# Patient Record
Sex: Female | Born: 1960 | Race: White | Hispanic: No | Smoking: Current every day smoker
Health system: Southern US, Community
[De-identification: ages and names within clinical notes are randomized; demographics above are authoritative.]

## PROBLEM LIST (undated history)

## (undated) DIAGNOSIS — E559 Vitamin D deficiency, unspecified: Secondary | ICD-10-CM

## (undated) DIAGNOSIS — I700 Atherosclerosis of aorta: Secondary | ICD-10-CM

## (undated) DIAGNOSIS — M810 Age-related osteoporosis without current pathological fracture: Secondary | ICD-10-CM

## (undated) DIAGNOSIS — I708 Atherosclerosis of other arteries: Secondary | ICD-10-CM

## (undated) DIAGNOSIS — J449 Chronic obstructive pulmonary disease, unspecified: Secondary | ICD-10-CM

## (undated) DIAGNOSIS — Z720 Tobacco use: Secondary | ICD-10-CM

## (undated) DIAGNOSIS — I10 Essential (primary) hypertension: Secondary | ICD-10-CM

## (undated) HISTORY — DX: Age-related osteoporosis without current pathological fracture: M81.0

## (undated) HISTORY — DX: Atherosclerosis of aorta: I70.0

## (undated) HISTORY — DX: Atherosclerosis of other arteries: I70.8

## (undated) HISTORY — PX: TUBAL LIGATION: SHX77

## (undated) HISTORY — PX: OTHER SURGICAL HISTORY: SHX169

## (undated) HISTORY — DX: Vitamin D deficiency, unspecified: E55.9

## (undated) HISTORY — DX: Tobacco use: Z72.0

## (undated) HISTORY — DX: Chronic obstructive pulmonary disease, unspecified: J44.9

---

## 2009-11-07 ENCOUNTER — Inpatient Hospital Stay: Payer: Self-pay | Admitting: Specialist

## 2009-11-07 HISTORY — PX: ANKLE FRACTURE SURGERY: SHX122

## 2010-05-08 ENCOUNTER — Emergency Department (HOSPITAL_COMMUNITY): Admission: EM | Admit: 2010-05-08 | Discharge: 2010-05-08 | Payer: Self-pay | Admitting: Emergency Medicine

## 2010-06-01 ENCOUNTER — Emergency Department: Payer: Self-pay | Admitting: Emergency Medicine

## 2010-10-22 LAB — URINALYSIS, ROUTINE W REFLEX MICROSCOPIC
Bilirubin Urine: NEGATIVE
Ketones, ur: NEGATIVE mg/dL
Nitrite: NEGATIVE
Protein, ur: NEGATIVE mg/dL
Specific Gravity, Urine: 1.02 (ref 1.005–1.030)
Urobilinogen, UA: 0.2 mg/dL (ref 0.0–1.0)

## 2010-10-22 LAB — PROTIME-INR
INR: 0.87 (ref 0.00–1.49)
Prothrombin Time: 12 seconds (ref 11.6–15.2)

## 2010-10-22 LAB — COMPREHENSIVE METABOLIC PANEL
Albumin: 4 g/dL (ref 3.5–5.2)
BUN: 6 mg/dL (ref 6–23)
Chloride: 110 mEq/L (ref 96–112)
Creatinine, Ser: 0.75 mg/dL (ref 0.4–1.2)
Total Bilirubin: 0.3 mg/dL (ref 0.3–1.2)

## 2010-10-22 LAB — CBC
MCH: 30.4 pg (ref 26.0–34.0)
MCV: 91.2 fL (ref 78.0–100.0)
Platelets: 345 10*3/uL (ref 150–400)
RDW: 13.1 % (ref 11.5–15.5)

## 2010-10-22 LAB — LACTIC ACID, PLASMA: Lactic Acid, Venous: 2.5 mmol/L — ABNORMAL HIGH (ref 0.5–2.2)

## 2010-10-22 LAB — URINE MICROSCOPIC-ADD ON

## 2011-08-14 ENCOUNTER — Emergency Department: Payer: Self-pay | Admitting: Emergency Medicine

## 2011-08-14 LAB — TROPONIN I: Troponin-I: <0.02 ng/mL

## 2011-08-14 LAB — COMPREHENSIVE METABOLIC PANEL
Alkaline Phosphatase: 78 U/L (ref 50–136)
BUN: 5 mg/dL — ABNORMAL LOW (ref 7–18)
Chloride: 109 mmol/L — ABNORMAL HIGH (ref 98–107)
Co2: 26 mmol/L (ref 21–32)
EGFR (African American): >60
EGFR (Non-African Amer.): >60
SGOT(AST): 21 U/L (ref 15–37)
SGPT (ALT): 24 U/L

## 2011-08-14 LAB — CBC
MCH: 31 pg (ref 26.0–34.0)
MCHC: 33.4 g/dL (ref 32.0–36.0)
MCV: 93 fL (ref 80–100)
Platelet: 310 10*3/uL (ref 150–440)
RDW: 13.1 % (ref 11.5–14.5)

## 2011-12-24 ENCOUNTER — Emergency Department (HOSPITAL_COMMUNITY): Payer: Self-pay

## 2011-12-24 ENCOUNTER — Encounter (HOSPITAL_COMMUNITY): Payer: Self-pay

## 2011-12-24 ENCOUNTER — Emergency Department (HOSPITAL_COMMUNITY)
Admission: EM | Admit: 2011-12-24 | Discharge: 2011-12-24 | Disposition: A | Discharge status: Home / Self Care | Payer: Self-pay | Attending: Emergency Medicine | Admitting: Emergency Medicine

## 2011-12-24 DIAGNOSIS — X500XXA Overexertion from strenuous movement or load, initial encounter: Secondary | ICD-10-CM | POA: Insufficient documentation

## 2011-12-24 DIAGNOSIS — M25473 Effusion, unspecified ankle: Secondary | ICD-10-CM | POA: Insufficient documentation

## 2011-12-24 DIAGNOSIS — L0390 Cellulitis, unspecified: Secondary | ICD-10-CM

## 2011-12-24 DIAGNOSIS — M25476 Effusion, unspecified foot: Secondary | ICD-10-CM | POA: Insufficient documentation

## 2011-12-24 DIAGNOSIS — S93409A Sprain of unspecified ligament of unspecified ankle, initial encounter: Secondary | ICD-10-CM | POA: Insufficient documentation

## 2011-12-24 DIAGNOSIS — M25579 Pain in unspecified ankle and joints of unspecified foot: Secondary | ICD-10-CM | POA: Insufficient documentation

## 2011-12-24 DIAGNOSIS — Y92009 Unspecified place in unspecified non-institutional (private) residence as the place of occurrence of the external cause: Secondary | ICD-10-CM | POA: Insufficient documentation

## 2011-12-24 DIAGNOSIS — L02419 Cutaneous abscess of limb, unspecified: Secondary | ICD-10-CM | POA: Insufficient documentation

## 2011-12-24 MED ORDER — CLINDAMYCIN HCL 150 MG PO CAPS: 300.0000 mg | ORAL_CAPSULE | Freq: Three times a day (TID) | ORAL | Status: DC

## 2011-12-24 MED ORDER — CLINDAMYCIN HCL 150 MG PO CAPS: 300.0000 mg | ORAL_CAPSULE | Freq: Three times a day (TID) | ORAL | Status: AC

## 2011-12-24 MED ORDER — KETOROLAC TROMETHAMINE 30 MG/ML IJ SOLN
30.0000 mg | Freq: Once | INTRAMUSCULAR | Status: AC
  Administered 2011-12-24: 30 mg via INTRAMUSCULAR
  Filled 2011-12-24: qty 1

## 2011-12-24 MED ORDER — OXYCODONE-ACETAMINOPHEN 5-325 MG PO TABS: 1.0000 | ORAL_TABLET | ORAL | Status: AC | PRN

## 2011-12-24 NOTE — ED Notes (Signed)
Pt c/o redness, swelling, and pain to (L) leg x4 days. Pt reports she broke her (L) ankle April 2011, had surgery to ankle, reports episodes of swelling to extremity, has received IV ABX w/ no relief. Pt also c/o sob, chest pain under (L) breast, N/V,  and headache x2-3 days. Pt denies back/abd pain or diaphoresis.

## 2011-12-24 NOTE — ED Notes (Signed)
Swelling in left foot and redness, sts nauseated and not feeling well

## 2011-12-24 NOTE — ED Provider Notes (Signed)
History     CSN: [id number]  Arrival date & time 12/24/11  1423   First MD Initiated Contact with Patient 12/24/11 1547      Chief Complaint  Patient presents with  . Foot Swelling    (Consider location/radiation/quality/duration/timing/severity/associated sxs/prior treatment) Patient is a 51 y.o. female presenting with ankle pain. The history is provided by the patient.  Ankle Pain  Incident onset: 4 days ago. The incident occurred at home. Injury mechanism: twisted left ankle. The pain is present in the left ankle. The quality of the pain is described as aching. The pain is mild. The pain has been constant since onset. Associated symptoms include inability to bear weight. She reports no foreign bodies present. The symptoms are aggravated by bearing weight and palpation. She has tried NSAIDs for the symptoms. The treatment provided mild relief.    Past Medical History  Diagnosis Date  . Asthma     History reviewed. No pertinent past surgical history.  History reviewed. No pertinent family history.  History  Substance Use Topics  . Smoking status: Not on file  . Smokeless tobacco: Not on file  . Alcohol Use:     OB History    Grav Para Term Preterm Abortions TAB SAB Ect Mult Living                  Review of Systems  Constitutional: Negative for fever and fatigue.  HENT: Negative for congestion, drooling and neck pain.   Eyes: Negative for pain.  Respiratory: Negative for cough and shortness of breath.   Cardiovascular: Negative for chest pain.  Gastrointestinal: Negative for nausea, vomiting, abdominal pain and diarrhea.  Genitourinary: Negative for dysuria and hematuria.  Musculoskeletal: Negative for back pain and gait problem.  Skin: Negative for color change.  Neurological: Negative for dizziness and headaches.  Hematological: Negative for adenopathy.  Psychiatric/Behavioral: Negative for behavioral problems.  All other systems reviewed and are  negative.    Allergies  Vicodin  Home Medications   Current Outpatient Rx  Name Route Sig Dispense Refill  . CLINDAMYCIN HCL 150 MG PO CAPS Oral Take 2 capsules (300 mg total) by mouth 3 (three) times daily. 30 capsule 0  . OXYCODONE-ACETAMINOPHEN 5-325 MG PO TABS Oral Take 1 tablet by mouth every 4 (four) hours as needed for pain. 20 tablet 0    BP 176/82  Pulse 84  Temp(Src) 97.8 F (36.6 C) (Oral)  Resp 18  Physical Exam  Constitutional: She is oriented to person, place, and time. She appears well-developed and well-nourished.  HENT:  Head: Normocephalic.  Mouth/Throat: No oropharyngeal exudate.  Eyes: Conjunctivae and EOM are normal. Pupils are equal, round, and reactive to light.  Neck: Normal range of motion. Neck supple.  Cardiovascular: Normal rate, regular rhythm, normal heart sounds and intact distal pulses.  Exam reveals no gallop and no friction rub.   No murmur heard. Pulmonary/Chest: Effort normal and breath sounds normal. No respiratory distress. She has no wheezes.  Abdominal: Soft. Bowel sounds are normal. There is no tenderness.  Musculoskeletal: Normal range of motion. She exhibits no edema.       Feet:       Normal rom of LLE.   Neurological: She is alert and oriented to person, place, and time.  Skin: Skin is warm and dry.  Psychiatric: She has a normal mood and affect. Her behavior is normal.    ED Course  Procedures (including critical care time)  Labs Reviewed - No  data to display Dg Ankle Complete Left  12/24/2011  *RADIOLOGY REPORT*  Clinical Data: Prior left ankle surgery.  Redness, swelling, lateral pain.  LEFT ANKLE COMPLETE - 3+ VIEW  Comparison: None.  Findings: Evidence of prior fusion with plate and screw fixation in the distal left fibula. No acute fracture, subluxation or dislocation.  Soft tissue swelling within the lateral lower calf. No hardware complicating feature.  IMPRESSION: Evidence of remote injury and internal fixation. No  acute bony abnormality.  Original Report Authenticated By: Cyndie Chime, M.D.     1. Cellulitis   2. Ankle sprain       MDM  5:13 PM 51 y.o. female w hx of left ankle surgery in 2011 after a fall pw redness and swelling of left ankle that occurred 4 days ago after twisting her ankle. Pt has had mild nausea, emesis x 1 today. Pt notes intermittent swelling/redness since her surgery, she states she was on po abx for 6 mos about a year ago. Pt AFVSS here, appears well on exam. Will get pain control and plain film.    5:13 PM: Pt continues to appear well, imaging non-contrib. Suspect mild cellulitis. Will give Rx for clinda and percocet. I have discussed the diagnosis/risks/treatment options with the patient and believe the pt to be eligible for discharge home to follow-up with surgeon, Dr. Hyacinth Meeker in 3 days. We also discussed returning to the ED immediately if new or worsening sx occur. We discussed the sx which are most concerning (e.g., spreading redness, worsening pain) that necessitate immediate return. Any new prescriptions provided to the patient are listed below. Will keep pt NWB to LLE.   New Prescriptions   CLINDAMYCIN (CLEOCIN) 150 MG CAPSULE    Take 2 capsules (300 mg total) by mouth 3 (three) times daily.   OXYCODONE-ACETAMINOPHEN (PERCOCET) 5-325 MG PER TABLET    Take 1 tablet by mouth every 4 (four) hours as needed for pain.    Clinical Impression 1. Cellulitis   2. Ankle sprain        Purvis Sheffield, MD 12/25/11 [phone]

## 2011-12-26 NOTE — ED Provider Notes (Signed)
I have personally seen and examined the patient.  I have discussed the plan of care with the resident.  I have reviewed the documentation on PMH/FH/Soc. History.  I have reviewed the documentation of the resident and agree.  I doubt septic joint at this time, pt reports symptoms started after twisting ankle Advised f/u with her orthopedist  Joya Gaskins, MD 12/26/11 0130

## 2011-12-27 ENCOUNTER — Emergency Department: Payer: Self-pay | Admitting: Emergency Medicine

## 2011-12-27 LAB — CBC
HGB: 13.3 g/dL (ref 12.0–16.0)
MCH: 30 pg (ref 26.0–34.0)
MCHC: 32.4 g/dL (ref 32.0–36.0)
MCV: 93 fL (ref 80–100)
Platelet: 272 10*3/uL (ref 150–440)
RDW: 13.1 % (ref 11.5–14.5)

## 2011-12-27 LAB — BASIC METABOLIC PANEL
Anion Gap: 8 (ref 7–16)
Calcium, Total: 10.3 mg/dL — ABNORMAL HIGH (ref 8.5–10.1)
Chloride: 111 mmol/L — ABNORMAL HIGH (ref 98–107)
Co2: 24 mmol/L (ref 21–32)
Creatinine: 0.65 mg/dL (ref 0.60–1.30)
Glucose: 80 mg/dL (ref 65–99)
Osmolality: 283 (ref 275–301)
Potassium: 3.4 mmol/L — ABNORMAL LOW (ref 3.5–5.1)

## 2011-12-29 ENCOUNTER — Emergency Department: Payer: Self-pay | Admitting: Unknown Physician Specialty

## 2011-12-29 LAB — COMPREHENSIVE METABOLIC PANEL
Albumin: 3.3 g/dL — ABNORMAL LOW (ref 3.4–5.0)
Co2: 26 mmol/L (ref 21–32)
EGFR (Non-African Amer.): >60
Osmolality: 286 (ref 275–301)
SGOT(AST): 26 U/L (ref 15–37)

## 2011-12-29 LAB — CBC
HCT: 41.7 % (ref 35.0–47.0)
MCV: 93 fL (ref 80–100)
Platelet: 304 10*3/uL (ref 150–440)

## 2011-12-31 ENCOUNTER — Emergency Department: Payer: Self-pay | Admitting: Unknown Physician Specialty

## 2011-12-31 ENCOUNTER — Other Ambulatory Visit: Payer: Self-pay | Admitting: Podiatry

## 2011-12-31 LAB — CBC
HCT: 41.8 % (ref 35.0–47.0)
HGB: 13.3 g/dL (ref 12.0–16.0)
MCH: 29.6 pg (ref 26.0–34.0)
MCHC: 31.9 g/dL — ABNORMAL LOW (ref 32.0–36.0)
MCV: 93 fL (ref 80–100)
Platelet: 306 10*3/uL (ref 150–440)
RBC: 4.51 10*6/uL (ref 3.80–5.20)

## 2011-12-31 LAB — COMPREHENSIVE METABOLIC PANEL
Anion Gap: 6 — ABNORMAL LOW (ref 7–16)
BUN: 9 mg/dL (ref 7–18)
Bilirubin,Total: 0.2 mg/dL (ref 0.2–1.0)
Calcium, Total: 10.9 mg/dL — ABNORMAL HIGH (ref 8.5–10.1)
Chloride: 112 mmol/L — ABNORMAL HIGH (ref 98–107)
Co2: 27 mmol/L (ref 21–32)
Creatinine: 0.7 mg/dL (ref 0.60–1.30)
EGFR (African American): >60
Osmolality: 286 (ref 275–301)
Potassium: 3.7 mmol/L (ref 3.5–5.1)
SGOT(AST): 23 U/L (ref 15–37)
SGPT (ALT): 22 U/L
Sodium: 145 mmol/L (ref 136–145)

## 2012-01-04 ENCOUNTER — Other Ambulatory Visit: Payer: Self-pay | Admitting: Podiatry

## 2012-01-10 LAB — WOUND CULTURE

## 2012-01-13 LAB — MISC AER/ANAEROBIC CULT.

## 2012-01-28 ENCOUNTER — Ambulatory Visit: Payer: Self-pay | Admitting: Orthopaedic Surgery

## 2012-01-28 ENCOUNTER — Inpatient Hospital Stay: Payer: Self-pay | Admitting: Specialist

## 2012-01-28 LAB — CBC WITH DIFFERENTIAL/PLATELET
Basophil %: 0.9 %
Eosinophil #: 0.2 10*3/uL (ref 0.0–0.7)
Eosinophil %: 1.8 %
HGB: 13.6 g/dL (ref 12.0–16.0)
MCH: 29.3 pg (ref 26.0–34.0)
MCHC: 31.9 g/dL — ABNORMAL LOW (ref 32.0–36.0)
MCV: 92 fL (ref 80–100)
Monocyte #: 1.3 x10 3/mm — ABNORMAL HIGH (ref 0.2–0.9)
Neutrophil #: 9.1 10*3/uL — ABNORMAL HIGH (ref 1.4–6.5)
Neutrophil %: 66.3 %
Platelet: 275 10*3/uL (ref 150–440)
RDW: 13.4 % (ref 11.5–14.5)
WBC: 13.7 10*3/uL — ABNORMAL HIGH (ref 3.6–11.0)

## 2012-01-28 LAB — BASIC METABOLIC PANEL
Anion Gap: 8 (ref 7–16)
Co2: 20 mmol/L — ABNORMAL LOW (ref 21–32)
Creatinine: 0.74 mg/dL (ref 0.60–1.30)
EGFR (African American): >60
EGFR (Non-African Amer.): >60
Potassium: 3.7 mmol/L (ref 3.5–5.1)

## 2012-01-30 LAB — VANCOMYCIN, TROUGH: Vancomycin, Trough: 10 ug/mL (ref 10–20)

## 2012-01-31 LAB — BASIC METABOLIC PANEL
BUN: 5 mg/dL — ABNORMAL LOW (ref 7–18)
Calcium, Total: 9.6 mg/dL (ref 8.5–10.1)
Chloride: 109 mmol/L — ABNORMAL HIGH (ref 98–107)
Co2: 20 mmol/L — ABNORMAL LOW (ref 21–32)
Creatinine: 0.69 mg/dL (ref 0.60–1.30)
EGFR (Non-African Amer.): >60
Glucose: 114 mg/dL — ABNORMAL HIGH (ref 65–99)
Osmolality: 281 (ref 275–301)
Potassium: 3.4 mmol/L — ABNORMAL LOW (ref 3.5–5.1)
Sodium: 142 mmol/L (ref 136–145)

## 2012-01-31 LAB — CBC WITH DIFFERENTIAL/PLATELET
Basophil #: 0.1 10*3/uL (ref 0.0–0.1)
Basophil %: 0.9 %
HCT: 33.7 % — ABNORMAL LOW (ref 35.0–47.0)
Lymphocyte #: 2.7 10*3/uL (ref 1.0–3.6)
Lymphocyte %: 23.1 %
MCH: 30.3 pg (ref 26.0–34.0)
MCV: 92 fL (ref 80–100)
Monocyte #: 1.2 x10 3/mm — ABNORMAL HIGH (ref 0.2–0.9)
Monocyte %: 10.7 %
Neutrophil %: 63.2 %
RBC: 3.67 10*6/uL — ABNORMAL LOW (ref 3.80–5.20)
WBC: 11.6 10*3/uL — ABNORMAL HIGH (ref 3.6–11.0)

## 2012-01-31 LAB — SEDIMENTATION RATE: Erythrocyte Sed Rate: 42 mm/hr — ABNORMAL HIGH (ref 0–30)

## 2012-02-02 LAB — CBC WITH DIFFERENTIAL/PLATELET
Basophil #: 0.1 10*3/uL (ref 0.0–0.1)
Eosinophil #: 0.6 10*3/uL (ref 0.0–0.7)
Eosinophil %: 5.9 %
HCT: 37.1 % (ref 35.0–47.0)
HGB: 11.9 g/dL — ABNORMAL LOW (ref 12.0–16.0)
Lymphocyte #: 3.1 10*3/uL (ref 1.0–3.6)
MCH: 29.7 pg (ref 26.0–34.0)
MCHC: 32.2 g/dL (ref 32.0–36.0)
MCV: 92 fL (ref 80–100)
Neutrophil #: 6 10*3/uL (ref 1.4–6.5)
Neutrophil %: 54.5 %
Platelet: 279 10*3/uL (ref 150–440)
RBC: 4.01 10*6/uL (ref 3.80–5.20)
WBC: 11 10*3/uL (ref 3.6–11.0)

## 2012-02-03 LAB — CULTURE, BLOOD (SINGLE)

## 2012-02-05 LAB — WOUND CULTURE

## 2012-02-07 ENCOUNTER — Other Ambulatory Visit: Payer: Self-pay | Admitting: Specialist

## 2012-02-07 LAB — CBC WITH DIFFERENTIAL/PLATELET
Basophil %: 0.9 %
Eosinophil #: 0.5 10*3/uL (ref 0.0–0.7)
Eosinophil %: 4.3 %
HGB: 11.8 g/dL — ABNORMAL LOW (ref 12.0–16.0)
Lymphocyte #: 3.4 10*3/uL (ref 1.0–3.6)
MCHC: 31.3 g/dL — ABNORMAL LOW (ref 32.0–36.0)
Monocyte #: 0.9 x10 3/mm (ref 0.2–0.9)
Neutrophil %: 56.3 %
Platelet: 360 10*3/uL (ref 150–440)
RBC: 4.09 10*6/uL (ref 3.80–5.20)
RDW: 13.5 % (ref 11.5–14.5)

## 2012-02-07 LAB — BASIC METABOLIC PANEL
Anion Gap: 6 — ABNORMAL LOW (ref 7–16)
Calcium, Total: 10.5 mg/dL — ABNORMAL HIGH (ref 8.5–10.1)
Chloride: 110 mmol/L — ABNORMAL HIGH (ref 98–107)
Co2: 26 mmol/L (ref 21–32)
Creatinine: 0.85 mg/dL (ref 0.60–1.30)
Osmolality: 280 (ref 275–301)

## 2012-02-07 LAB — SEDIMENTATION RATE: Erythrocyte Sed Rate: 43 mm/hr — ABNORMAL HIGH (ref 0–30)

## 2012-02-11 DIAGNOSIS — T847XXA Infection and inflammatory reaction due to other internal orthopedic prosthetic devices, implants and grafts, initial encounter: Secondary | ICD-10-CM | POA: Insufficient documentation

## 2012-02-14 ENCOUNTER — Other Ambulatory Visit: Payer: Self-pay | Admitting: Specialist

## 2012-02-14 LAB — CBC WITH DIFFERENTIAL/PLATELET
Eosinophil %: 9.5 %
Lymphocyte #: 3.4 10*3/uL (ref 1.0–3.6)
MCH: 30.1 pg (ref 26.0–34.0)
MCHC: 32.5 g/dL (ref 32.0–36.0)
MCV: 93 fL (ref 80–100)
Monocyte %: 7.9 %
Neutrophil #: 4.9 10*3/uL (ref 1.4–6.5)
RBC: 4.3 10*6/uL (ref 3.80–5.20)

## 2012-02-14 LAB — BASIC METABOLIC PANEL
Co2: 25 mmol/L (ref 21–32)
EGFR (Non-African Amer.): >60
Glucose: 64 mg/dL — ABNORMAL LOW (ref 65–99)
Osmolality: 284 (ref 275–301)

## 2012-02-14 LAB — SEDIMENTATION RATE: Erythrocyte Sed Rate: 23 mm/hr (ref 0–30)

## 2012-03-27 ENCOUNTER — Other Ambulatory Visit: Payer: Self-pay | Admitting: Specialist

## 2012-03-27 LAB — CBC WITH DIFFERENTIAL/PLATELET
Basophil %: 1.2 %
Eosinophil #: 0.3 10*3/uL (ref 0.0–0.7)
Eosinophil %: 3.8 %
Lymphocyte #: 3 10*3/uL (ref 1.0–3.6)
MCH: 29.7 pg (ref 26.0–34.0)
MCHC: 32.5 g/dL (ref 32.0–36.0)
MCV: 91 fL (ref 80–100)
Monocyte #: 0.7 x10 3/mm (ref 0.2–0.9)
Neutrophil %: 47.2 %
Platelet: 296 10*3/uL (ref 150–440)
RBC: 4.58 10*6/uL (ref 3.80–5.20)

## 2012-03-27 LAB — SEDIMENTATION RATE: Erythrocyte Sed Rate: 15 mm/hr (ref 0–30)

## 2013-05-18 ENCOUNTER — Emergency Department: Payer: Self-pay | Admitting: Emergency Medicine

## 2013-12-04 ENCOUNTER — Emergency Department: Payer: Self-pay | Admitting: Emergency Medicine

## 2013-12-04 LAB — URINALYSIS, COMPLETE
Bilirubin,UR: NEGATIVE
Glucose,UR: NEGATIVE mg/dL (ref 0–75)
Ketone: NEGATIVE
Leukocyte Esterase: NEGATIVE
Nitrite: NEGATIVE
Ph: 7 (ref 4.5–8.0)
Protein: NEGATIVE
Specific Gravity: 1.002 (ref 1.003–1.030)
Squamous Epithelial: 2
WBC UR: 1 /HPF (ref 0–5)

## 2013-12-04 LAB — CBC
HCT: 46.8 % (ref 35.0–47.0)
HGB: 15.6 g/dL (ref 12.0–16.0)
MCH: 31.5 pg (ref 26.0–34.0)
MCHC: 33.4 g/dL (ref 32.0–36.0)
MCV: 94 fL (ref 80–100)
PLATELETS: 298 10*3/uL (ref 150–440)
RBC: 4.96 10*6/uL (ref 3.80–5.20)
RDW: 13.5 % (ref 11.5–14.5)
WBC: 12.8 10*3/uL — ABNORMAL HIGH (ref 3.6–11.0)

## 2013-12-04 LAB — BASIC METABOLIC PANEL
Anion Gap: 5 — ABNORMAL LOW (ref 7–16)
BUN: 8 mg/dL (ref 7–18)
CALCIUM: 11.8 mg/dL — AB (ref 8.5–10.1)
CHLORIDE: 111 mmol/L — AB (ref 98–107)
CO2: 27 mmol/L (ref 21–32)
Creatinine: 0.71 mg/dL (ref 0.60–1.30)
EGFR (Non-African Amer.): >60
Glucose: 90 mg/dL (ref 65–99)
Osmolality: 283 (ref 275–301)
POTASSIUM: 3.5 mmol/L (ref 3.5–5.1)
SODIUM: 143 mmol/L (ref 136–145)

## 2013-12-04 LAB — D-DIMER(ARMC): D-Dimer: 455 ng/ml

## 2014-12-01 NOTE — Consult Note (Signed)
Impression: 54yo WF w/ h/o left leg fracture, s/p ORIF in 2011 admitted with infected orthopedic hardware, s/p removal with GPC seen on gram stain.growing on culture.  Agree with vancomycin for now until the organism has been identified and sensitivities are known. All the hardware has been removed. Would recommend 6 weeks of IV therapy.  Will base antibiotics on culture results. BCx are negative.  Will have PICC placed. 5)  If the cultures remain negative, will use vancomycin for her therapy.   Electronic Signatures: Louvenia Golomb, Rosalyn GessMichael E (MD) (Signed on 24-Jun-13 09:35)  Authored   Last Updated: 24-Jun-13 11:47 by Galit Urich, Rosalyn GessMichael E (MD)

## 2014-12-01 NOTE — H&P (Signed)
Subjective/Chief Complaint Left ankle wound drainage and redness    History of Present Illness Initial injury of Left ankle fracture treated with ORIF in 2011.  Has had wound complications since that time with periodic drainage and cellulitis.  No other surgeries but several courses of local wound care and oral abx.  Hardware removal has been recommended to her in the past but never undertaken, the reasons are as of yet unclear.    Past History borderline HTN   Past Med/Surgical Hx:  HTN:   Tubal Ligation:   ORIF left ankle:   ALLERGIES:  Vicodin: GI Distress  Family and Social History:   Family History Non-Contributory    Social History positive  tobacco, negative ETOH, negative Illicit drugs    + Tobacco Current (within 1 year)    Place of Living Home   Review of Systems:   Fever/Chills No    Cough No    Sputum No    Abdominal Pain No    Diarrhea No    Constipation No    Nausea/Vomiting No    SOB/DOE No    Chest Pain No    Dysuria No    Tolerating Diet Yes    Medications/Allergies Reviewed Medications/Allergies reviewed   Physical Exam:   GEN obese    HEENT PERRL    NECK supple    RESP normal resp effort    CARD regular rate    ABD denies tenderness    LYMPH negative nodes LLE, positve edema    EXTR positive edema, LLE    SKIN open wound x 2 with purulend drainage and surrounding cellulitis    NEURO cranial nerves intact, motor/sensory function intact    PSYCH alert, A+O to time, place, person   Lab Results: Routine Chem:  21-Jun-13 17:03    Glucose, Serum 74   BUN 8   Creatinine (comp) 0.74   Sodium, Serum 141   Potassium, Serum 3.7   Chloride, Serum  113   CO2, Serum  20   Calcium (Total), Serum  11.1   Anion Gap 8   Osmolality (calc) 278   eGFR (African American) >60   eGFR (Non-African American) >60 (eGFR values <49m/min/1.73 m2 may be an indication of chronic kidney disease (CKD). Calculated eGFR is useful in  patients with stable renal function. The eGFR calculation will not be reliable in acutely ill patients when serum creatinine is changing rapidly. It is not useful in  patients on dialysis. The eGFR calculation may not be applicable to patients at the low and high extremes of body sizes, pregnant women, and vegetarians.)  Routine Hem:  21-Jun-13 17:03    WBC (CBC)  13.7   RBC (CBC) 4.64   Hemoglobin (CBC) 13.6   Hematocrit (CBC) 42.6   Platelet Count (CBC) 275   MCV 92   MCH 29.3   MCHC  31.9   RDW 13.4   Neutrophil % 66.3   Lymphocyte % 21.8   Monocyte % 9.2   Eosinophil % 1.8   Basophil % 0.9   Neutrophil #  9.1   Lymphocyte # 3.0   Monocyte #  1.3   Eosinophil # 0.2   Basophil # 0.1 (Result(s) reported on 28 Jan 2012 at 05:47PM.)   Radiology Results: XRay:    21-Jun-13 17:33, Ankle Left AP and Lateral   Ankle Left AP and Lateral   REASON FOR EXAM:    infection, draining wound, hardware in place  COMMENTS:  PROCEDURE: DXR - DXR ANKLE LEFT AP AND LATERAL  - Jan 28 2012  5:33PM     RESULT: Left ankle images are compared to the previous examination of 01 June 2010.    ORIF changes at the distal fibula are again noted. Hardware appears   intact and unchanged. Degenerative changes are noted about the tip of the   medial malleolus. No acute bony abnormality is evident. The alignment   appears unchanged.    IMPRESSION:   1. Postoperative changes with chronic changes as mentioned above. No     acute bony abnormality evident.    Dictation Site: 6          Verified By: Sundra Aland, M.D., MD     Assessment/Admission Diagnosis L ankle XR with signs of lucency around existing hardware distally, fracture appears healed  L ankle open wound with purulent drainage  admit to Ortho activity as tolerated WBAT elevate LLE hold further abx until OR in am, received Vanc dose in ED discussed risks and benefits of hardware removal and debridement with the  patient, she agrees to proceed NPO p MN ID consult after OR for recs on abx selection, dose, route, duration   Electronic Signatures: Margaret Pyle (MD)  (Signed 21-Jun-13 21:44)  Authored: CHIEF COMPLAINT and HISTORY, PAST MEDICAL/SURGIAL HISTORY, ALLERGIES, FAMILY AND SOCIAL HISTORY, REVIEW OF SYSTEMS, PHYSICAL EXAM, LABS, Radiology, ASSESSMENT AND PLAN   Last Updated: 21-Jun-13 21:44 by Margaret Pyle (MD)

## 2014-12-01 NOTE — Op Note (Signed)
PATIENT NAME:  Sheila [name], Sheila [name] MR#:  [id number] DATE OF BIRTH:  [birth year]  DATE OF PROCEDURE:  01/31/2012  PREOPERATIVE DIAGNOSES:  1. Orthopedic hardware infection.  2. Need for long-term IV antibiotics.   POSTOPERATIVE DIAGNOSES:  1. Orthopedic hardware infection.  2. Need for long-term IV antibiotics.   PROCEDURES:  1. Ultrasound guidance for vascular access to right basilic vein.  2. Fluoroscopic guidance for placement of catheter.  3. Insertion of peripherally inserted central venous catheter, right arm.  SURGEON: Annice NeedyJason S. Dew, MD.   ANESTHESIA: Local.   ESTIMATED BLOOD LOSS: Minimal.   INDICATION FOR PROCEDURE: This is a 54 year old white female with an orthopedic hardware infection requiring long-term IV antibiotics.   DESCRIPTION OF PROCEDURE: The patient's right arm was sterilely prepped and draped, and a sterile surgical field was created. The right basilic vein was accessed under direct ultrasound guidance without difficulty with a micropuncture needle and permanent image was recorded. 0.018 wire was then placed into the superior vena cava. Peel-away sheath was placed over the wire. A single lumen peripherally inserted central venous catheter was then placed over the wire and the wire and peel-away sheath were removed. The catheter tip was placed into the superior vena cava and was secured at the skin at 35 cm with a sterile dressing. The catheter      withdrew blood well and flushed easily with heparinized saline. The patient tolerated procedure well.   ____________________________ Annice NeedyJason S. Dew, MD jsd:ap D: 01/31/2012 17:07:07 ET T: 02/01/2012 08:29:55 ET JOB#: [id number]  cc: Annice NeedyJason S. Dew, MD, <Dictator> Annice NeedyJASON S DEW MD ELECTRONICALLY SIGNED 02/03/2012 9:24

## 2014-12-01 NOTE — Discharge Summary (Signed)
PATIENT NAME:  Sheila [name], Sheila [name] MR#:  [id number] DATE OF BIRTH:  [birth year]  DATE OF ADMISSION:  01/28/2012 DATE OF DISCHARGE:  02/04/2012  DISCHARGE DIAGNOSES:  1. Abscess lateral left ankle with nonhealing wound.  2. Chronic obstructive pulmonary disease.   OPERATIONS/PROCEDURES PERFORMED: Left ankle hardware removal and debridement of left ankle wound on 01/29/2012 by Dr. Lurlean Nannyharles Sykes.   HISTORY AND PHYSICAL EXAMINATION: As noted on admission.   LABORATORY DATA: As noted in the chart.   HOSPITAL COURSE: The patient was admitted through the Emergency Room by Orthopedic surgeon Lurlean Nannyharles Sykes, MD. The patient was taken to the Operating Room on the following day where hardware removal and irrigation and debridement of her left ankle wound was performed. Cultures were sent. The patient was kept in the hospital until final culture results were available. Antibiotic selection was per Dr. Leavy CellaBlocker from Infectious Disease note. The patient had a PICC line placed. The patient was discharged to her home on 02/04/2012 with intravenous antibiotics as arranged per Dr. Leavy CellaBlocker.   FOLLOW UP: She is to return to the office to see me in 10 days for examination of her wound and possible suture removal.  ____________________________ Clare Gandyhristopher E. Tyneka Scafidi, MD ces:cbb D: 02/21/2012 12:45:22 ET T: 02/21/2012 12:54:42 ET JOB#: [id number]  cc: Clare Gandyhristopher E. Meili Kleckley, MD, <Dictator> Clare GandyHRISTOPHER E Lian Pounds MD ELECTRONICALLY SIGNED 02/22/2012 6:51

## 2014-12-01 NOTE — Consult Note (Signed)
PATIENT NAME:  Sheila [name], Sheila [name] MR#:  [id number] DATE OF BIRTH:  [birth year]  DATE OF CONSULTATION:  01/31/2012  REFERRING PHYSICIAN:  Dr. Gerilyn Pilgrim CONSULTING PHYSICIAN:  Rosalyn Gess. Eleri Ruben, MD  REASON FOR CONSULTATION: Orthopedic hardware infection.   HISTORY OF PRESENT ILLNESS: The patient is a 54 year old white female with a past history significant for a fracture of the left ankle in 2001, status post ORIF, who has had multiple episodes of drainage from the wound over the last several years. She has received several courses of oral antibiotics with some temporary improvement in the drainage. She has noted over the last month or so worsening redness, pain, difficulty bearing weight with some chills and sweats as well as drainage from the wound. She was admitted to the hospital on 06/21. She had received in the last month two rounds of trimethoprim/sulfamethoxazole without significant improvement. She was given a dose of vancomycin in the ER and then admitted to the hospital. She underwent removal of her hardware on 06/22. Preop and intraoperative cultures are pending; however, the Gram stain has been positive for gram-positive cocci. Blood cultures from admission have been negative.   ALLERGIES: Vicodin.   PAST MEDICAL HISTORY: Ankle fracture after she slipped on the ice in 2011. She underwent ORIF, and her course has been complicated by infection starting approximately two months out from her initial surgery.   FAMILY HISTORY: Positive for chronic obstructive pulmonary disease.   SOCIAL HISTORY: The patient lives with her boyfriend. She has dogs at home. She does not drink.    REVIEW OF SYSTEMS: GENERAL: No fevers. Positive chills. Positive sweats. HEENT: No headaches. No sinus congestion. Rare sore throat. NECK: No stiffness. No swollen glands. RESPIRATORY: Occasional shortness of breath. No current cough or sputum production. CARDIAC: No chest pains. No palpitations. She has had peripheral edema  in the left leg related to her infection. GI: Some nausea and vomiting, no abdominal pain, no change in her bowels. GENITOURINARY: No change in her urine. MUSCULOSKELETAL: She has had pain and swelling in the left leg, difficulty ambulating due to her pain. She has had drainage from her prior surgical wounds as well. SKIN: No rashes other than the left lower extremity. PSYCHIATRIC: No complaints. NEUROLOGIC: No focal weakness.   PHYSICAL EXAMINATION:  VITAL SIGNS: T-max 99.3, T-current of 98.0, pulse 74, blood pressure 151/84, 96% on room air.   GENERAL: A 54 year old white female in no acute distress.   HEENT: Normocephalic, atraumatic. Pupils are equal and reactive to light. Extraocular motion intact. Sclerae, conjunctivae, and lids without evidence for emboli or petechiae. Oropharynx shows no erythema or exudate. Teeth and gums are in fair condition.   NECK: Supple. Full range of motion. Midline trachea. No lymphadenopathy.   CHEST: Clear to auscultation bilaterally with good air movement. No focal consolidation.   CARDIAC: Regular rate and rhythm without murmur, rub, or gallop.   ABDOMEN: Soft, nontender, and nondistended. No hepatosplenomegaly. No hernia is noted.   EXTREMITIES: No evidence for tenosynovitis.   SKIN: No rashes. Her left leg wound had a boot on, and was covered in bandages, and was not directly observed. There was no evidence for lymphangitic streaking. No stigmata of endocarditis, specifically no Janeway lesions nor Osler nodes.   NEUROLOGIC: The patient was awake and interactive, moving all four extremities.   PSYCHIATRIC: Mood and affect appeared normal.   LABORATORY, DIAGNOSTIC AND RADIOLOGICAL DATA:  BUN 5, creatinine 0.69, bicarbonate 20, anion gap 13. White count of 11.6 with a hemoglobin  of 11.1, platelet count of 223, ANC of 7.4, sedimentation rate of 42. A CRP is pending. White count from 06/21 was 13.7 with an ANC of 9.1.  A wound culture from admission has  a Gram stain showing gram-positive cocci. Culture is currently negative.  Blood cultures from admission are negative.  Intraoperative wound cultures have gram-positive cocci present but no growth to date.  X-rays of the left ankle showed postoperative changes with hardware present.   IMPRESSION: A 54 year old white female with a history of left leg fracture, status post ORIF in 2001, admitted with infected orthopedic hardware status post removal with gram-positive cocci seen on Gram stain.   RECOMMENDATIONS:  1. I agree with vancomycin for now until the organism is identified and sensitivities are known.  2. All of the hardware has been removed.  3. I would recommend six weeks of IV therapy. We will base antibiotics on the culture results.  4. Blood cultures are negative. We will have a PICC line placed.  5. If the cultures remain negative, we will likely use vancomycin for her therapy.   This  is a moderately complex Infectious Disease case.   Thank you very much for involving me in Ms. Paulick's care.  ____________________________ Rosalyn GessMichael E. Garon Melander, MD meb:cbb D: 01/31/2012 11:47:11 ET T: 01/31/2012 11:58:07 ET JOB#: [id number]  cc: Rosalyn GessMichael E. Freddrick Gladson, MD, <Dictator> Tyanne Derocher E Lyal Husted MD ELECTRONICALLY SIGNED 02/04/2012 15:08

## 2014-12-01 NOTE — Op Note (Signed)
PATIENT NAME:  Sheila [name], GLOTFELTY MR#:  [id number] DATE OF BIRTH:  08/16/1960  DATE OF PROCEDURE:  01/29/2012  PREOPERATIVE DIAGNOSIS: Left ankle wound infection.   POSTOPERATIVE DIAGNOSIS: Left ankle wound infection.   PROCEDURE: Left ankle hardware removal and irrigation and debridement of left ankle wound.   SURGEON: Collier Bullock. Matti Killingsworth, MD  ANESTHESIA: General.   COMPLICATIONS: None.   SPECIMENS: Cultures x2.   DRAINS: One TLS drain.   ESTIMATED BLOOD LOSS: 50 mL.   REPLACEMENT: For complete fluid replacement please see anesthesia records.   INDICATIONS FOR THE PROCEDURE: Patient is a 54 year old female who sustained an ankle fracture treated by Dr. Hyacinth Meeker in 2011 with open reduction internal fixation. Since that time she had had difficulty healing the incision and has long standing and chronic situation of multiple episodes of open draining wound to the left ankle. She had been followed by her local podiatrist for this and at times the wound had significant drainage. She had been placed on multiple courses of oral antibiotics. With each course of oral antibiotics the condition of her ankle would somewhat improve, however, symptoms continued to return each time she stopped her antibiotics. She presented to the Emergency Department here at [facility] for further evaluation and treatment on the day prior to this procedure. Long discussion was held with the patient regarding the risks and benefits of operative versus nonoperative management. She elected to proceed with operative management for a more definitive debridement and hardware removal in hope of clearing any underlying nidus of infection. The patient understood that the risks of the procedure included but were not limited to risk of bleeding, continued infection, damage to nerves, vessels, and other structures around the area, need for other procedures, refracture of the previously fractured fibula and deep vein thrombosis or PE.  Patient expressed understanding of these risks and elected to proceed with operative management. Informed consent was obtained prior to the procedure.   DESCRIPTION OF THE PROCEDURE IN DETAIL: The patient was brought to the Operating Room, placed supine on the OR table. After general anesthesia was induced, timeout was conducted where the left side was identified as the correct side, the patient was identified as correct patient, allergies were reviewed and agreement was made to hold further antibiotic treatment until cultures could be taken. It should be noted that the patient did receive a course of Bactrim oral antibiotics prior to presentation to the Emergency Department. She also received one dose of IV vancomycin in the Emergency Department prior to orthopedics involvement in her case. The left lower extremity was prepped and draped in a normal sterile fashion. The prior sinus tracts and affected skin edges were excised sharply. Dissection was taken down to the level of the plate. There was some purulent material expressed at that point. This was sent for the first culture. Once culture had been obtained, vancomycin was initiated for antibiotic therapy. At that point the leg was elevated, allowed to exsanguinate with gravity and the tourniquet was inflated to 250 mmHg. Further dissection was carried down proximally and distally to expose the other remaining screws and the plate. All seven screws from the plate were removed and the plate was removed from the lateral aspect of the fibula without complication. The underlying fibrinous debris and screw holes were curetted and rongeured to remove underlying soft tissue and nonviable tissues from the area. Dissection was then carried anteriorly around the anterior border of the fibula to expose the lag screw. This was also removed  without complication. Following removal of hardware the wound was irrigated with a 3 liter bag of antibiotic impregnated solution  delivered by gravity fed cysto tubing. Once the debridement and irrigation and been completed tourniquet was released for a total time of 25 minutes on the tourniquet. Hemostasis was achieved. Due to concern for bleeding from the curetted prior screw tract a #7 TLS drain was placed deep in the wound and exited proximally. The subcutaneous tissues were closed in interrupted fashion with 2-0 Monocryl and skin was closed in an interrupted fashion with 3-0 nylon. Dry sterile dressings were applied and patient was placed into a fracture boot for further stability and protection following her hardware removal.   DISPOSITION: The patient will be weight-bearing as tolerated in her fracture boot to the left lower extremity. She can walk with physical therapy to be up and activity as tolerated. Will plan to have her TLS drain come out tomorrow provided the output is not extravagant. She can change her dressing in 2 to 3 days and keep a dry dressing until stitches have been removed. Stitches should be removed approximately 2 to 3 weeks after surgery depending on the appearance of the incision. The patient has been advised on multiple occasions and personally by myself to undergo a repeat attempt at smoking cessation in attempt to better heal her wound and clear her infection. She was on empiric vancomycin, continued as her postoperative antibiotic while we await cultures. The inpatient disease service will be consulted for further recommendations regarding antibiotic choice, dose, duration and route. If it is decided that the patient should warrant long-term IV therapy she may need PICC line placed prior to her discharge. The patient will be followed by Dr. Myra Rudehristopher Smith here at [facility] for her follow-up care.  ____________________________ Collier Bullockharles V. Keala Drum, MD [facility] D: 01/29/2012 11:13:33 ET T: 01/29/2012 11:38:19 ET JOB#: [id number]  cc: Collier Bullockharles V. Krystin Keeven, MD, <Dictator> Otilio SaberHARLES V Giulia Hickey  MD ELECTRONICALLY SIGNED 03/06/2012 8:38

## 2015-11-10 ENCOUNTER — Emergency Department
Admission: EM | Admit: 2015-11-10 | Discharge: 2015-11-10 | Disposition: A | Discharge status: Home / Self Care | Payer: Self-pay | Attending: Emergency Medicine | Admitting: Emergency Medicine

## 2015-11-10 ENCOUNTER — Encounter: Payer: Self-pay | Admitting: Emergency Medicine

## 2015-11-10 DIAGNOSIS — J45909 Unspecified asthma, uncomplicated: Secondary | ICD-10-CM | POA: Insufficient documentation

## 2015-11-10 DIAGNOSIS — N3090 Cystitis, unspecified without hematuria: Secondary | ICD-10-CM | POA: Insufficient documentation

## 2015-11-10 LAB — URINALYSIS COMPLETE WITH MICROSCOPIC (ARMC ONLY)
BILIRUBIN URINE: NEGATIVE
Glucose, UA: NEGATIVE mg/dL
KETONES UR: NEGATIVE mg/dL
NITRITE: NEGATIVE
PH: 7 (ref 5.0–8.0)
Protein, ur: NEGATIVE mg/dL
SPECIFIC GRAVITY, URINE: 1.001 — AB (ref 1.005–1.030)

## 2015-11-10 MED ORDER — OXYCODONE-ACETAMINOPHEN 5-325 MG PO TABS
1.0000 | ORAL_TABLET | Freq: Once | ORAL | Status: AC
  Administered 2015-11-10: 1 via ORAL
  Filled 2015-11-10: qty 1

## 2015-11-10 MED ORDER — MORPHINE SULFATE (PF) 4 MG/ML IV SOLN: 4.0000 mg | Freq: Once | INTRAVENOUS | Status: DC

## 2015-11-10 MED ORDER — PROMETHAZINE HCL 25 MG PO TABS: 25.0000 mg | ORAL_TABLET | Freq: Four times a day (QID) | ORAL | Status: DC | PRN

## 2015-11-10 MED ORDER — ONDANSETRON 4 MG PO TBDP
8.0000 mg | ORAL_TABLET | Freq: Once | ORAL | Status: AC
  Administered 2015-11-10: 8 mg via ORAL
  Filled 2015-11-10: qty 2

## 2015-11-10 MED ORDER — SULFAMETHOXAZOLE-TRIMETHOPRIM 800-160 MG PO TABS: 1.0000 | ORAL_TABLET | Freq: Two times a day (BID) | ORAL | Status: DC

## 2015-11-10 MED ORDER — PHENAZOPYRIDINE HCL 200 MG PO TABS: 200.0000 mg | ORAL_TABLET | Freq: Three times a day (TID) | ORAL | Status: DC | PRN

## 2015-11-10 NOTE — ED Notes (Signed)
Pt reports abd pain with painful urination.  Sx for 2-3 days.  No n/v/d.  Pt states pain radiates into mid back area.  Pt alert.

## 2015-11-10 NOTE — ED Provider Notes (Signed)
[facility] Emergency Department Provider Note  ____________________________________________  Time seen: 7:00 PM  I have reviewed the triage vital signs and the nursing notes.   HISTORY  Chief Complaint Abdominal Pain    HPI Sheila [name] is a 55 y.o. female complains of urinary frequency urgency and dysuria for the past 2 days. She also complains of some suprapubic pain that seems to wrap around to her bilateral lower back. Has some nausea but no vomiting. No fevers or chills. No chest pain shortness of breath. No diarrhea.     Past Medical History  Diagnosis Date  . Asthma      There are no active problems to display for this patient.    History reviewed. No pertinent past surgical history.   Current Outpatient Rx  Name  Route  Sig  Dispense  Refill  . phenazopyridine (PYRIDIUM) 200 MG tablet   Oral   Take 1 tablet (200 mg total) by mouth 3 (three) times daily as needed for pain.   10 tablet   0   . promethazine (PHENERGAN) 25 MG tablet   Oral   Take 1 tablet (25 mg total) by mouth every 6 (six) hours as needed for nausea or vomiting.   15 tablet   0   . sulfamethoxazole-trimethoprim (BACTRIM DS) 800-160 MG tablet   Oral   Take 1 tablet by mouth 2 (two) times daily.   14 tablet   0      Allergies Vicodin   No family history on file.  Social History Social History  Substance Use Topics  . Smoking status: Never Smoker   . Smokeless tobacco: None  . Alcohol Use: None    Review of Systems  Constitutional:   No fever or chills. No weight changes Eyes:   No vision changes.  ENT:   No sore throat. No rhinorrhea. Cardiovascular:   No chest pain. Respiratory:   No dyspnea or cough. Gastrointestinal:   Negative for abdominal pain, vomiting and diarrhea.  No BRBPR or melena. Genitourinary:   Positive dysuria and frequency.. Musculoskeletal:   Negative for focal pain or swelling Skin:   Negative for rash. Neurological:    Negative for headaches, focal weakness or numbness.  10-point ROS otherwise negative.  ____________________________________________   PHYSICAL EXAM:  VITAL SIGNS: ED Triage Vitals  Enc Vitals Group     BP 11/10/15 1652 154/88 mmHg     Pulse Rate 11/10/15 1652 80     Resp 11/10/15 1652 18     Temp 11/10/15 1652 97.6 F (36.4 C)     Temp Source 11/10/15 1652 Oral     SpO2 11/10/15 1652 98 %     Weight 11/10/15 1652 180 lb (81.647 kg)     Height 11/10/15 1652  (1.6 m)     Head Cir --      Peak Flow --      Pain Score 11/10/15 1652 7     Pain Loc --      Pain Edu? --      Excl. in GC? --     Vital signs reviewed, nursing assessments reviewed.   Constitutional:   Alert and oriented. Well appearing and in no distress. Eyes:   No scleral icterus. No conjunctival pallor. PERRL. EOMI ENT   Head:   Normocephalic and atraumatic.   Nose:   No congestion/rhinnorhea. No septal hematoma   Mouth/Throat:   MMM, no pharyngeal erythema. No peritonsillar mass.    Neck:  No stridor. No SubQ emphysema. No meningismus. Hematological/Lymphatic/Immunilogical:   No cervical lymphadenopathy. Cardiovascular:   RRR. Symmetric bilateral radial and DP pulses.  No murmurs.  Respiratory:   Normal respiratory effort without tachypnea nor retractions. Breath sounds are clear and equal bilaterally. No wheezes/rales/rhonchi. Gastrointestinal:   Soft with suprapubic tenderness and left lower quadrant tenderness. Non distended. There is no CVA tenderness.  No rebound, rigidity, or guarding. Genitourinary:   deferred Musculoskeletal:   Nontender with normal range of motion in all extremities. No joint effusions.  No lower extremity tenderness.  No edema. Neurologic:   Normal speech and language.  CN 2-10 normal. Motor grossly intact. No gross focal neurologic deficits are appreciated.  Skin:    Skin is warm, dry and intact. No rash noted.  No petechiae, purpura, or bullae. Psychiatric:    Mood and affect are normal. ____________________________________________    LABS (pertinent positives/negatives) (all labs ordered are listed, but only abnormal results are displayed) Labs Reviewed  URINALYSIS COMPLETEWITH MICROSCOPIC ([facility] ONLY) - Abnormal; Notable for the following:    Color, Urine STRAW (*)    APPearance HAZY (*)    Specific Gravity, Urine 1.001 (*)    Hgb urine dipstick 2+ (*)    Leukocytes, UA 3+ (*)    Bacteria, UA MANY (*)    Squamous Epithelial / LPF 0-5 (*)    All other components within normal limits  URINE CULTURE   ____________________________________________   EKG    ____________________________________________    RADIOLOGY    ____________________________________________   PROCEDURES   ____________________________________________   INITIAL IMPRESSION / ASSESSMENT AND PLAN / ED COURSE  Pertinent labs & imaging results that were available during my care of the patient were reviewed by me and considered in my medical decision making (see chart for details).  Patient presents with suprapubic and left lower quadrant pain and tenderness for the past few days.  Urinalysis is strongly consistent with urinary tract infection and cystitis even though it is nitrate negative. We'll send a urine culture and start the patient on Bactrim. Follow-up with primary care in 1 week.     ____________________________________________   FINAL CLINICAL IMPRESSION(S) / ED DIAGNOSES  Final diagnoses:  Cystitis      Sharman CheekPhillip Cleto Claggett, MD 11/10/15 [phone]

## 2015-11-10 NOTE — Discharge Instructions (Signed)

## 2015-11-10 NOTE — ED Notes (Signed)
Reports lower abd pain rad to flank bilat. Urinary hesitancy

## 2015-11-15 ENCOUNTER — Telehealth: Payer: Self-pay | Admitting: Emergency Medicine

## 2015-11-15 NOTE — ED Notes (Signed)
Pt encouraged to follow up with her dr if she isn't getting any better and to also have her urine rechecked once she is finished with the antibiotic. Pt also encouraged to return if she starts to feel worse

## 2015-12-03 ENCOUNTER — Emergency Department: Payer: Self-pay

## 2015-12-03 ENCOUNTER — Inpatient Hospital Stay
Admission: EM | Admit: 2015-12-03 | Discharge: 2015-12-06 | DRG: 872 | Disposition: A | Discharge status: Home / Self Care | Payer: Self-pay | Attending: Internal Medicine | Admitting: Internal Medicine

## 2015-12-03 ENCOUNTER — Encounter: Payer: Self-pay | Admitting: *Deleted

## 2015-12-03 DIAGNOSIS — N12 Tubulo-interstitial nephritis, not specified as acute or chronic: Secondary | ICD-10-CM | POA: Diagnosis present

## 2015-12-03 DIAGNOSIS — Z9889 Other specified postprocedural states: Secondary | ICD-10-CM

## 2015-12-03 DIAGNOSIS — I10 Essential (primary) hypertension: Secondary | ICD-10-CM | POA: Diagnosis present

## 2015-12-03 DIAGNOSIS — A4151 Sepsis due to Escherichia coli [E. coli]: Principal | ICD-10-CM | POA: Diagnosis present

## 2015-12-03 DIAGNOSIS — N10 Acute tubulo-interstitial nephritis: Secondary | ICD-10-CM | POA: Diagnosis present

## 2015-12-03 DIAGNOSIS — F17210 Nicotine dependence, cigarettes, uncomplicated: Secondary | ICD-10-CM | POA: Diagnosis present

## 2015-12-03 DIAGNOSIS — Z888 Allergy status to other drugs, medicaments and biological substances status: Secondary | ICD-10-CM

## 2015-12-03 DIAGNOSIS — J45909 Unspecified asthma, uncomplicated: Secondary | ICD-10-CM | POA: Diagnosis present

## 2015-12-03 DIAGNOSIS — E860 Dehydration: Secondary | ICD-10-CM | POA: Diagnosis present

## 2015-12-03 DIAGNOSIS — Z885 Allergy status to narcotic agent status: Secondary | ICD-10-CM

## 2015-12-03 DIAGNOSIS — Z8249 Family history of ischemic heart disease and other diseases of the circulatory system: Secondary | ICD-10-CM

## 2015-12-03 DIAGNOSIS — Z825 Family history of asthma and other chronic lower respiratory diseases: Secondary | ICD-10-CM

## 2015-12-03 DIAGNOSIS — R0602 Shortness of breath: Secondary | ICD-10-CM

## 2015-12-03 HISTORY — DX: Essential (primary) hypertension: I10

## 2015-12-03 LAB — CBC WITH DIFFERENTIAL/PLATELET
BASOS PCT: 1 %
Basophils Absolute: 0.1 10*3/uL (ref 0–0.1)
EOS ABS: 0 10*3/uL (ref 0–0.7)
EOS PCT: 0 %
HEMATOCRIT: 42.5 % (ref 35.0–47.0)
HEMOGLOBIN: 14.2 g/dL (ref 12.0–16.0)
Lymphocytes Relative: 7 %
Lymphs Abs: 1.8 10*3/uL (ref 1.0–3.6)
MCH: 30.2 pg (ref 26.0–34.0)
MCHC: 33.3 g/dL (ref 32.0–36.0)
MCV: 90.6 fL (ref 80.0–100.0)
Monocytes Absolute: 0.6 10*3/uL (ref 0.2–0.9)
Monocytes Relative: 2 %
NEUTROS PCT: 90 %
Neutro Abs: 21.7 10*3/uL — ABNORMAL HIGH (ref 1.4–6.5)
Platelets: 296 10*3/uL (ref 150–440)
RBC: 4.69 MIL/uL (ref 3.80–5.20)
RDW: 13.7 % (ref 11.5–14.5)
WBC: 24.3 10*3/uL — AB (ref 3.6–11.0)

## 2015-12-03 LAB — URINALYSIS COMPLETE WITH MICROSCOPIC (ARMC ONLY)
Bilirubin Urine: NEGATIVE
Glucose, UA: NEGATIVE mg/dL
Ketones, ur: NEGATIVE mg/dL
Nitrite: POSITIVE — AB
PH: 7 (ref 5.0–8.0)
Protein, ur: NEGATIVE mg/dL
SPECIFIC GRAVITY, URINE: 1.002 — AB (ref 1.005–1.030)

## 2015-12-03 LAB — COMPREHENSIVE METABOLIC PANEL
ALK PHOS: 87 U/L (ref 38–126)
ALT: 26 U/L (ref 14–54)
ANION GAP: 10 (ref 5–15)
AST: 27 U/L (ref 15–41)
Albumin: 3.9 g/dL (ref 3.5–5.0)
BILIRUBIN TOTAL: 0.6 mg/dL (ref 0.3–1.2)
BUN: 11 mg/dL (ref 6–20)
CALCIUM: 11.5 mg/dL — AB (ref 8.9–10.3)
CO2: 24 mmol/L (ref 22–32)
Chloride: 105 mmol/L (ref 101–111)
Creatinine, Ser: 1 mg/dL (ref 0.44–1.00)
GFR calc Af Amer: >60 mL/min (ref >60)
GLUCOSE: 122 mg/dL — AB (ref 65–99)
Potassium: 3.5 mmol/L (ref 3.5–5.1)
Sodium: 139 mmol/L (ref 135–145)
TOTAL PROTEIN: 7.7 g/dL (ref 6.5–8.1)

## 2015-12-03 LAB — TSH: TSH: 3.908 u[IU]/mL (ref 0.350–4.500)

## 2015-12-03 MED ORDER — ENOXAPARIN SODIUM 40 MG/0.4ML ~~LOC~~ SOLN
40.0000 mg | SUBCUTANEOUS | Status: DC
  Administered 2015-12-03 – 2015-12-04 (×2): 40 mg via SUBCUTANEOUS
  Filled 2015-12-03 (×2): qty 0.4

## 2015-12-03 MED ORDER — MORPHINE SULFATE (PF) 2 MG/ML IV SOLN: 2.0000 mg | INTRAVENOUS | Status: DC | PRN

## 2015-12-03 MED ORDER — DIATRIZOATE MEGLUMINE & SODIUM 66-10 % PO SOLN
15.0000 mL | Freq: Once | ORAL | Status: AC
  Administered 2015-12-03: 15 mL via ORAL
  Filled 2015-12-03: qty 30

## 2015-12-03 MED ORDER — IOPAMIDOL (ISOVUE-300) INJECTION 61%
100.0000 mL | Freq: Once | INTRAVENOUS | Status: AC | PRN
  Administered 2015-12-03: 100 mL via INTRAVENOUS
  Filled 2015-12-03: qty 100

## 2015-12-03 MED ORDER — PNEUMOCOCCAL VAC POLYVALENT 25 MCG/0.5ML IJ INJ
0.5000 mL | INJECTION | INTRAMUSCULAR | Status: DC
  Filled 2015-12-03: qty 0.5

## 2015-12-03 MED ORDER — ONDANSETRON HCL 4 MG PO TABS: 4.0000 mg | ORAL_TABLET | Freq: Four times a day (QID) | ORAL | Status: DC | PRN

## 2015-12-03 MED ORDER — LEVOFLOXACIN IN D5W 500 MG/100ML IV SOLN
500.0000 mg | INTRAVENOUS | Status: DC
  Administered 2015-12-03: 500 mg via INTRAVENOUS
  Filled 2015-12-03 (×2): qty 100

## 2015-12-03 MED ORDER — ACETAMINOPHEN 325 MG PO TABS
650.0000 mg | ORAL_TABLET | Freq: Four times a day (QID) | ORAL | Status: DC | PRN
  Administered 2015-12-04 – 2015-12-05 (×3): 650 mg via ORAL
  Filled 2015-12-03 (×3): qty 2

## 2015-12-03 MED ORDER — NICOTINE 21 MG/24HR TD PT24
21.0000 mg | MEDICATED_PATCH | Freq: Every day | TRANSDERMAL | Status: DC
  Administered 2015-12-04 – 2015-12-06 (×3): 21 mg via TRANSDERMAL
  Filled 2015-12-03 (×4): qty 1

## 2015-12-03 MED ORDER — OXYCODONE-ACETAMINOPHEN 5-325 MG PO TABS
2.0000 | ORAL_TABLET | Freq: Once | ORAL | Status: AC
  Administered 2015-12-03: 2 via ORAL
  Filled 2015-12-03: qty 2

## 2015-12-03 MED ORDER — DEXTROSE 5 % IV SOLN
1.0000 g | Freq: Once | INTRAVENOUS | Status: AC
  Administered 2015-12-03: 1 g via INTRAVENOUS
  Filled 2015-12-03 (×2): qty 10

## 2015-12-03 MED ORDER — ONDANSETRON HCL 4 MG/2ML IJ SOLN
4.0000 mg | Freq: Four times a day (QID) | INTRAMUSCULAR | Status: DC | PRN
  Administered 2015-12-04: 4 mg via INTRAVENOUS
  Filled 2015-12-03: qty 2

## 2015-12-03 MED ORDER — SODIUM CHLORIDE 0.9 % IV SOLN
INTRAVENOUS | Status: DC
  Administered 2015-12-03 – 2015-12-04 (×3): via INTRAVENOUS

## 2015-12-03 MED ORDER — ACETAMINOPHEN 650 MG RE SUPP: 650.0000 mg | Freq: Four times a day (QID) | RECTAL | Status: DC | PRN

## 2015-12-03 MED ORDER — KETOROLAC TROMETHAMINE 30 MG/ML IJ SOLN
INTRAMUSCULAR | Status: AC
  Filled 2015-12-03: qty 1

## 2015-12-03 MED ORDER — SODIUM CHLORIDE 0.9 % IV BOLUS (SEPSIS)
1000.0000 mL | Freq: Once | INTRAVENOUS | Status: AC
Start: 2015-12-03 — End: 2015-12-03
  Administered 2015-12-03: 1000 mL via INTRAVENOUS

## 2015-12-03 MED ORDER — OXYCODONE-ACETAMINOPHEN 5-325 MG PO TABS
1.0000 | ORAL_TABLET | ORAL | Status: DC | PRN
  Administered 2015-12-03 – 2015-12-04 (×3): 1 via ORAL
  Filled 2015-12-03 (×3): qty 1

## 2015-12-03 MED ORDER — ONDANSETRON 4 MG PO TBDP
4.0000 mg | ORAL_TABLET | Freq: Once | ORAL | Status: AC
  Administered 2015-12-03: 4 mg via ORAL
  Filled 2015-12-03: qty 1

## 2015-12-03 NOTE — H&P (Signed)
[facility] [place] [facility]   PATIENT NAME: Sheila [name]    MR#:  [id number]  DATE OF BIRTH:  [birth year]  DATE OF ADMISSION:  12/03/2015  PRIMARY CARE PHYSICIAN: No primary care provider on file.   REQUESTING/REFERRING PHYSICIAN: Dr. Daryel November  CHIEF COMPLAINT:   Chief Complaint  Patient presents with  . Flank Pain    HISTORY OF PRESENT ILLNESS:  Sheila [name]  is a 55 y.o. female with a known history of recent urinary tract infection treated with Bactrim. Patient has been having on and off back pain since she was started on antibiotics for urinary tract infection. The pain worsened on Saturday. 10 out of 10 intensity in the left back coming around into the left abdomen. Now it's about 7 out of 10 intensity. She complains that it's hard to urinate. She's been having hot flashes and cold chills but no fever. Her recent urine culture on 11/10/2015 grew out Shigella Sonnei. The patient finished a course of Bactrim. Looking back at the MIC on Bactrim it was high at 20. In the ER, she was found to have stranding on bilateral kidneys consistent with pyelonephritis and also she had an elevated white count and hospitalist services were contacted for further evaluation.  PAST MEDICAL HISTORY:   Past Medical History  Diagnosis Date  . Asthma   . Hypertension     PAST SURGICAL HISTORY:   Past Surgical History  Procedure Laterality Date  . Left ankle surgery    . Left ankle hardware removal      SOCIAL HISTORY:   Social History  Substance Use Topics  . Smoking status: Current Every Day Smoker -- 1.00 packs/day    Types: Cigarettes  . Smokeless tobacco: Not on file  . Alcohol Use: No    FAMILY HISTORY:   Family History  Problem Relation Age of Onset  . COPD Mother   . Heart failure Mother   . COPD Father   . Heart failure Father     DRUG ALLERGIES:   Allergies  Allergen Reactions  . Vicodin [Hydrocodone-Acetaminophen] Nausea  And Vomiting  . Tramadol Palpitations    REVIEW OF SYSTEMS:  CONSTITUTIONAL: No fever. Positive for hot flashes and cold chills. Positive for weight gain. Positive for fatigue.  EYES:  Wears glasses and some decrease in vision EARS, NOSE, AND THROAT: No tinnitus or ear pain. Positive for sore throat. RESPIRATORY: No cough, shortness of breath, wheezing or hemoptysis.  CARDIOVASCULAR: Positive for chest pain and palpitations on and off, no orthopnea, edema.  GASTROINTESTINAL: Some nausea, no vomiting. Some diarrhea. Left-sided abdominal pain. No blood in bowel movements GENITOURINARY: No dysuria, hematuria. Positive for trouble urinating ENDOCRINE: No polyuria, nocturia,  HEMATOLOGY: No anemia, easy bruising or bleeding SKIN: No rash or lesion. MUSCULOSKELETAL: No joint pain or arthritis.   NEUROLOGIC: No tingling, numbness, weakness.  PSYCHIATRY: No anxiety or depression.   MEDICATIONS AT HOME:   Prior to Admission medications   Medication Sig Start Date End Date Taking? Authorizing Provider  ibuprofen (ADVIL,MOTRIN) 200 MG tablet Take 800 mg by mouth every 6 (six) hours as needed for headache or mild pain.   Yes Historical Provider, MD      VITAL SIGNS:  Blood pressure 133/82, pulse 68, temperature 98 F (36.7 C), temperature source Oral, resp. rate 20, height  (1.575 m), weight 77.111 kg (170 lb), SpO2 98 %.  PHYSICAL EXAMINATION:  GENERAL:  55 y.o.-year-old patient lying in the bed with no  acute distress.  EYES: Pupils equal, round, reactive to light and accommodation. No scleral icterus. Extraocular muscles intact.  HEENT: Head atraumatic, normocephalic. Oropharynx and nasopharynx clear.  NECK:  Supple, no jugular venous distention. No thyroid enlargement, no tenderness.  LUNGS: Normal breath sounds bilaterally, no wheezing, rales,rhonchi or crepitation. No use of accessory muscles of respiration.  CARDIOVASCULAR: S1, S2 normal. No murmurs, rubs, or gallops.  ABDOMEN:  Soft, left-sided abdominal tenderness, nondistended. Bowel sounds present. No organomegaly or mass. Positive for left-sided CVA tenderness EXTREMITIES: No pedal edema, cyanosis, or clubbing.  NEUROLOGIC: Cranial nerves II through XII are intact. Muscle strength 5/5 in all extremities. Sensation intact. Gait not checked.  PSYCHIATRIC: The patient is alert and oriented x 3.  SKIN: No rash, lesion, or ulcer.   LABORATORY PANEL:   CBC  Recent Labs Lab 12/03/15 1426  WBC 24.3*  HGB 14.2  HCT 42.5  PLT 296   ------------------------------------------------------------------------------------------------------------------  Chemistries   Recent Labs Lab 12/03/15 1426  NA 139  K 3.5  CL 105  CO2 24  GLUCOSE 122*  BUN 11  CREATININE 1.00  CALCIUM 11.5*  AST 27  ALT 26  ALKPHOS 87  BILITOT 0.6   ------------------------------------------------------------------------------------------------------------------  Cardiac Enzymes No results for input(s): TROPONINI in the last 168 hours. ------------------------------------------------------------------------------------------------------------------  RADIOLOGY:  Ct Abdomen Pelvis W Contrast  12/03/2015  CLINICAL DATA:  Flank pain and dysuria for 3 days. EXAM: CT ABDOMEN AND PELVIS WITH CONTRAST TECHNIQUE: Multidetector CT imaging of the abdomen and pelvis was performed using the standard protocol following bolus administration of intravenous contrast. CONTRAST:  100mL ISOVUE-300 IOPAMIDOL (ISOVUE-300) INJECTION 61% COMPARISON:  None. FINDINGS: Lower chest: Lung bases are clear. Small amount of pericardial fluid. Hepatobiliary: Normal appearance of the liver and gallbladder. Portal venous system is patent. Pancreas: Normal appearance of the pancreas without inflammation or duct dilatation. Spleen: Within normal limits in size and appearance. Adrenals/Urinary Tract: There is a 1.4 cm low-density nodule involving the left adrenal gland  which is indeterminate but likely an incidental finding based on the size and low density on the delayed images. Small amount of perinephric stranding along the right kidney upper pole. A small amount of perinephric stranding around the left kidney. There is a striated enhancement pattern throughout the left kidney suggestive for pyelonephritis. Striated enhancement along the upper pole of the right kidney is also compatible with pyelonephritis. No evidence for hydronephrosis. Urinary bladder is decompressed. Normal appearance of the right adrenal gland. Stomach/Bowel: No acute abnormality to the stomach, small bowel or colon. Normal appearance of the appendix. No evidence for bowel obstruction. Vascular/Lymphatic: Atherosclerotic disease in the abdominal aorta without aneurysm. Main visceral arteries are patent. Atherosclerotic calcifications and disease in the iliac arteries bilaterally. There is no significant abdominal or pelvic lymphadenopathy Reproductive: Normal appearance of the uterus and adnexal tissue. Other: No significant free fluid.  No evidence for free air. Musculoskeletal:  No suspicious bone findings. IMPRESSION: Inflammatory changes involving both kidneys are suggestive for bilateral pyelonephritis. Pyelonephritis is more diffuse in the left kidney and localized towards the upper pole on the right kidney. No evidence for a renal abscess. No hydronephrosis. Electronically Signed   By: Richarda OverlieAdam  Henn M.D.   On: 12/03/2015 16:32     IMPRESSION AND PLAN:   1.  Pyelonephritis, leukocytosis, left flank pain. Patient failed outpatient treatment with Bactrim. Patient has Shigella Sonnei in previous urine culture. MIC for Levaquin is very good so I will prescribe Levaquin. Pain control with Percocet and morphine.  2. Hypercalcemia likely secondary to dehydration. Looking back at old labs calcium is been on the higher side in the past so I'll check a PTH. 3. 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  Will need follow-up as outpatient. 4. History of asthma. Respiratory status stable 5. Tobacco abuse smoking cessation counseling done 3 minutes by me and nicotine patch ordered  All the records are reviewed and case discussed with ED provider. Management plans discussed with the patient, family and she is in agreement.  CODE STATUS: Full code  TOTAL TIME TAKING CARE OF THIS PATIENT: 50 minutes.    Alford Highland M.D on 12/03/2015 at 5:20 PM  Between 7am to 6pm - Pager - [phone]  After 6pm call admission pager [phone]  [facility]  [phone]  CC: Primary care physician; No primary care provider on file.

## 2015-12-03 NOTE — ED Provider Notes (Signed)
[facility] Emergency Department Provider Note        Time seen: ----------------------------------------- 2:41 PM on 12/03/2015 -----------------------------------------    I have reviewed the triage vital signs and the nursing notes.   HISTORY  Chief Complaint Flank Pain    HPI Sheila [name] is a 55 y.o. female who presents ER were for flank pain and dysuria for the past 3 days. Patient was seen here approximately month ago and was found to have Shigella in her urine culture. Patient states she has intermittent had diarrhea for the past few days associated with abdominal pain. She has not had a history of this before this month, nothing makes her symptoms better. She thinks she may have had fever and chills.   Past Medical History  Diagnosis Date  . Asthma     There are no active problems to display for this patient.   History reviewed. No pertinent past surgical history.  Allergies Vicodin  Social History Social History  Substance Use Topics  . Smoking status: Current Every Day Smoker -- 1.00 packs/day    Types: Cigarettes  . Smokeless tobacco: None  . Alcohol Use: No   Review of Systems Constitutional: Negative for fever. Eyes: Negative for visual changes. ENT: Negative for sore throat. Cardiovascular: Negative for chest pain. Respiratory: Negative for shortness of breath. Gastrointestinal: Positive for abdominal pain and diarrhea Genitourinary: Positive for dysuria Musculoskeletal: Negative for back pain. Skin: Negative for rash. Neurological: Negative for headaches, focal weakness or numbness.  10-point ROS otherwise negative.  ____________________________________________   PHYSICAL EXAM:  VITAL SIGNS: ED Triage Vitals  Enc Vitals Group     BP 12/03/15 1324 133/82 mmHg     Pulse Rate 12/03/15 1324 68     Resp 12/03/15 1324 20     Temp 12/03/15 1324 98 F (36.7 C)     Temp Source 12/03/15 1324 Oral     SpO2  12/03/15 1324 98 %     Weight 12/03/15 1324 170 lb (77.111 kg)     Height 12/03/15 1324  (1.575 m)     Head Cir --      Peak Flow --      Pain Score 12/03/15 1324 8     Pain Loc --      Pain Edu? --      Excl. in GC? --    Constitutional: Alert and oriented.  Mild distress Eyes: Conjunctivae are normal. PERRL. Normal extraocular movements. ENT   Head: Normocephalic and atraumatic.   Nose: No congestion/rhinnorhea.   Mouth/Throat: Mucous membranes are moist.   Neck: No stridor. Cardiovascular: Normal rate, regular rhythm. No murmurs, rubs, or gallops. Respiratory: Normal respiratory effort without tachypnea nor retractions. Breath sounds are clear and equal bilaterally. No wheezes/rales/rhonchi. Gastrointestinal: Nonfocal tenderness, normal bowel sounds. Musculoskeletal: Nontender with normal range of motion in all extremities. No lower extremity tenderness nor edema. Neurologic:  Normal speech and language. No gross focal neurologic deficits are appreciated.  Skin:  Skin is warm, dry and intact. No rash noted. Psychiatric: Mood and affect are normal. Speech and behavior are normal.  ____________________________________________  ED COURSE:  Pertinent labs & imaging results that were available during my care of the patient were reviewed by me and considered in my medical decision making (see chart for details).  patient is in mild distress, I will recheck her labs and urine and likely discuss with infectious disease.  ____________________________________________    LABS (pertinent positives/negatives)  Labs Reviewed  URINALYSIS COMPLETEWITH MICROSCOPIC ([facility] ONLY) - Abnormal; Notable for the following:    Color, Urine YELLOW (*)    APPearance HAZY (*)    Specific Gravity, Urine 1.002 (*)    Hgb urine dipstick 2+ (*)    Nitrite POSITIVE (*)    Leukocytes, UA 3+ (*)    Bacteria, UA MANY (*)    Squamous Epithelial / LPF 0-5 (*)    All other components within  normal limits  CBC WITH DIFFERENTIAL/PLATELET - Abnormal; Notable for the following:    WBC 24.3 (*)    Neutro Abs 21.7 (*)    All other components within normal limits  COMPREHENSIVE METABOLIC PANEL   imaging: IMPRESSION: Inflammatory changes involving both kidneys are suggestive for bilateral pyelonephritis. Pyelonephritis is more diffuse in the left kidney and localized towards the upper pole on the right kidney. No evidence for a renal abscess. No hydronephrosis.  ____________________________________________  FINAL ASSESSMENT AND PLAN   Pyelonephritis   Patient with labs and imaging as  dictated above. Patient had presented here previously with a UTI and was started on antibiotics which appeared adequate coverage for her Shigella infection. I have discussed with infectious disease, I have recently urine culture, she is received IV Rocephin. CT reveals bilateral pyelonephritis. I'll recommend continued IV antibiotics and observation.   Emily FilbertWilliams, Jonathan E, MD   Note: This dictation was prepared with Dragon dictation. Any transcriptional errors that result from this process are unintentional   Emily FilbertJonathan E Williams, MD 12/03/15 1640

## 2015-12-03 NOTE — ED Notes (Signed)
Pt complains of flank pain and dysuria for the last 3 days, pt denies nay other symptoms

## 2015-12-03 NOTE — ED Notes (Signed)
Report given to Anabella, RN. 

## 2015-12-03 NOTE — ED Notes (Signed)
Dr. Hilton SinclairWeiting in room to assess patient at this time.

## 2015-12-04 ENCOUNTER — Inpatient Hospital Stay: Payer: MEDICAID

## 2015-12-04 LAB — PTH, INTACT AND CALCIUM
Calcium, Total (PTH): 10.3 mg/dL — ABNORMAL HIGH (ref 8.7–10.2)
PTH: 58 pg/mL (ref 15–65)

## 2015-12-04 LAB — BLOOD CULTURE ID PANEL (REFLEXED)
ACINETOBACTER BAUMANNII: NOT DETECTED
CANDIDA ALBICANS: NOT DETECTED
CANDIDA GLABRATA: NOT DETECTED
CANDIDA KRUSEI: NOT DETECTED
Candida parapsilosis: NOT DETECTED
Candida tropicalis: NOT DETECTED
Carbapenem resistance: NOT DETECTED
ENTEROBACTERIACEAE SPECIES: DETECTED — AB
ESCHERICHIA COLI: DETECTED — AB
Enterobacter cloacae complex: NOT DETECTED
Enterococcus species: NOT DETECTED
Haemophilus influenzae: NOT DETECTED
KLEBSIELLA OXYTOCA: NOT DETECTED
Klebsiella pneumoniae: NOT DETECTED
Listeria monocytogenes: NOT DETECTED
Methicillin resistance: NOT DETECTED
NEISSERIA MENINGITIDIS: NOT DETECTED
PSEUDOMONAS AERUGINOSA: NOT DETECTED
Proteus species: NOT DETECTED
STREPTOCOCCUS AGALACTIAE: NOT DETECTED
STREPTOCOCCUS SPECIES: NOT DETECTED
Serratia marcescens: NOT DETECTED
Staphylococcus aureus (BCID): NOT DETECTED
Staphylococcus species: NOT DETECTED
Streptococcus pneumoniae: NOT DETECTED
Streptococcus pyogenes: NOT DETECTED
Vancomycin resistance: NOT DETECTED

## 2015-12-04 LAB — CBC
HCT: 33.2 % — ABNORMAL LOW (ref 35.0–47.0)
Hemoglobin: 11.1 g/dL — ABNORMAL LOW (ref 12.0–16.0)
MCH: 30.4 pg (ref 26.0–34.0)
MCHC: 33.5 g/dL (ref 32.0–36.0)
MCV: 90.9 fL (ref 80.0–100.0)
PLATELETS: 241 10*3/uL (ref 150–440)
RBC: 3.65 MIL/uL — ABNORMAL LOW (ref 3.80–5.20)
RDW: 13.4 % (ref 11.5–14.5)
WBC: 21.8 10*3/uL — AB (ref 3.6–11.0)

## 2015-12-04 LAB — BASIC METABOLIC PANEL
Anion gap: 7 (ref 5–15)
BUN: 10 mg/dL (ref 6–20)
CO2: 23 mmol/L (ref 22–32)
CREATININE: 0.78 mg/dL (ref 0.44–1.00)
Calcium: 9.9 mg/dL (ref 8.9–10.3)
Chloride: 108 mmol/L (ref 101–111)
GFR calc Af Amer: >60 mL/min (ref >60)
Glucose, Bld: 113 mg/dL — ABNORMAL HIGH (ref 65–99)
POTASSIUM: 3.2 mmol/L — AB (ref 3.5–5.1)
SODIUM: 138 mmol/L (ref 135–145)

## 2015-12-04 LAB — VITAMIN D 25 HYDROXY (VIT D DEFICIENCY, FRACTURES): Vit D, 25-Hydroxy: 19 ng/mL — ABNORMAL LOW (ref 30.0–100.0)

## 2015-12-04 LAB — BRAIN NATRIURETIC PEPTIDE: B NATRIURETIC PEPTIDE 5: 328 pg/mL — AB (ref 0.0–100.0)

## 2015-12-04 MED ORDER — IPRATROPIUM-ALBUTEROL 0.5-2.5 (3) MG/3ML IN SOLN: 3.0000 mL | Freq: Four times a day (QID) | RESPIRATORY_TRACT | Status: DC | PRN

## 2015-12-04 MED ORDER — SODIUM CHLORIDE 0.9 % IV SOLN
1.0000 g | Freq: Three times a day (TID) | INTRAVENOUS | Status: DC
  Administered 2015-12-04 – 2015-12-05 (×4): 1 g via INTRAVENOUS
  Filled 2015-12-04 (×5): qty 1

## 2015-12-04 MED ORDER — POTASSIUM CHLORIDE CRYS ER 20 MEQ PO TBCR
40.0000 meq | EXTENDED_RELEASE_TABLET | Freq: Once | ORAL | Status: AC
  Administered 2015-12-04: 40 meq via ORAL
  Filled 2015-12-04: qty 2

## 2015-12-04 MED ORDER — FUROSEMIDE 10 MG/ML IJ SOLN
40.0000 mg | Freq: Two times a day (BID) | INTRAMUSCULAR | Status: DC
  Administered 2015-12-04 – 2015-12-05 (×2): 40 mg via INTRAVENOUS
  Filled 2015-12-04 (×2): qty 4

## 2015-12-04 NOTE — Progress Notes (Signed)
Pharmacy Antibiotic Note  Sheila [name] is a 55 y.o. female admitted on 12/03/2015 with pyelonephritis, now with BCID identification of e coli in blood culture.  Pharmacy has been consulted for meropenem dosing.  Plan: Discussed cultures with MD. Meropenem recommended for E. Coli per BCID protocol. Will discontinue current orders for levofloxacin and order meropenem 1gm IV Q8H.   Height: 5\' 2"  (157.5 cm) Weight: 170 lb (77.111 kg) IBW/kg (Calculated) : 50.1  Temp (24hrs), Avg:98.5 F (36.9 C), Min:97.5 F (36.4 C), Max:100 F (37.8 C)   Recent Labs Lab 12/03/15 1426 12/04/15 0336  WBC 24.3* 21.8*  CREATININE 1.00 0.78    Estimated Creatinine Clearance: 76.4 mL/min (by C-G formula based on Cr of 0.78).    Allergies  Allergen Reactions  . Vicodin [Hydrocodone-Acetaminophen] Nausea And Vomiting  . Tramadol Palpitations    Antimicrobials this admission: Ceftriaxone 4/26 x 1 dose Levaquin 4/26 x 1 dose Meropenem 4/27 >>   Dose adjustments this admission:   Microbiology results: 4/26 BCx: BCID - e.coli 1/2 4/26 UCx: pend   Thank you for allowing pharmacy to be a part of this patient's care.  Affie Gasner C 12/04/2015 2:26 PM

## 2015-12-04 NOTE — Progress Notes (Signed)
Initial Nutrition Assessment     INTERVENTION:  Monitor intake and cater to pt preferences Recommend adding mightyshake BID for added nutrition   NUTRITION DIAGNOSIS:   Inadequate oral intake related to acute illness as evidenced by per patient/family report.    GOAL:   Patient will meet greater than or equal to 90% of their needs    MONITOR:   PO intake, Supplement acceptance  REASON FOR ASSESSMENT:   Malnutrition Screening Tool    ASSESSMENT:   55 y/o female admitted with pyelonephritis, hypercalcemia, left renal nodule  Past Medical History  Diagnosis Date  . Asthma   . Hypertension      Pt reports poor po intake for the past few days prior to admission. Ate couple bites of lunch today but stomach started hurting  Medications reviewed NS at 12525ml/hr Labs reviewed: K 3.2, glucose 113, WBC 21.8  Nutrition-Focused physical exam completed. Findings are no fat depletion, no muscle depletion, and mild on left side edema.     Diet Order:  Diet regular Room service appropriate?: Yes; Fluid consistency:: Thin  Skin:  Reviewed, no issues  Last BM:  4/26  Height:   Ht Readings from Last 1 Encounters:  12/03/15 5\' 2"  (1.575 m)    Weight: Pt reports UBW of 170 pounds  Wt Readings from Last 1 Encounters:  12/03/15 170 lb (77.111 kg)    Ideal Body Weight:     BMI:  Body mass index is 31.09 kg/(m^2).  Estimated Nutritional Needs:   Kcal:  1500-1750 kcals/d.   Protein:  75-87 g/d  Fluid:  1.5-1.7 L/d  EDUCATION NEEDS:   No education needs identified at this time  Margit Batte B. Freida BusmanAllen, RD, LDN [phone] (pager) Weekend/On-Call pager [phone])

## 2015-12-04 NOTE — Progress Notes (Signed)
[facility]                                                                                                                                                                                            Patient Demographics   Sheila [name], is a 55 y.o. female, DOB - 03-Dec-1960, ZOX:[id number]  Admit date - 12/03/2015   Admitting Physician Alford Highland, MD  Outpatient Primary MD for the patient is No primary care provider on file.   LOS - 1  Subjective: Patient continues to have flank pain. And some urinary burning. But denies any fevers or chills.     Review of Systems:   CONSTITUTIONAL: No documented fever. No fatigue, weakness. No weight gain, no weight loss.  EYES: No blurry or double vision.  ENT: No tinnitus. No postnasal drip. No redness of the oropharynx.  RESPIRATORY: No cough, no wheeze, no hemoptysis. No dyspnea.  CARDIOVASCULAR: No chest pain. No orthopnea. No palpitations. No syncope.  GASTROINTESTINAL: No nausea, no vomiting or diarrhea. No abdominal pain. No melena or hematochezia.  GENITOURINARY: Positive dysuria or no hematuria.  ENDOCRINE: No polyuria or nocturia. No heat or cold intolerance.  HEMATOLOGY: No anemia. No bruising. No bleeding.  INTEGUMENTARY: No rashes. No lesions.  MUSCULOSKELETAL: No arthritis. No swelling. No gout. As it if flank pain NEUROLOGIC: No numbness, tingling, or ataxia. No seizure-type activity.  PSYCHIATRIC: No anxiety. No insomnia. No ADD.    Vitals:   Filed Vitals:   12/03/15 1835 12/03/15 1901 12/04/15 0100 12/04/15 0525  BP: 143/98 153/63 130/70 148/69  Pulse: 85  80 93  Temp: 97.5 F (36.4 C)  97.9 F (36.6 C) 98.5 F (36.9 C)  TempSrc: Oral  Oral Oral  Resp: 20   20  Height:      Weight:      SpO2: 98%  98% 91%    Wt Readings from Last 3 Encounters:  12/03/15 77.111 kg (170 lb)  11/10/15 81.647 kg (180 lb)     Intake/Output Summary (Last 24 hours) at 12/04/15  1308 Last data filed at 12/04/15 1208  Gross per 24 hour  Intake 2044.62 ml  Output   1450 ml  Net 594.62 ml    Physical Exam:   GENERAL: Pleasant-appearing in no apparent distress.  HEAD, EYES, EARS, NOSE AND THROAT: Atraumatic, normocephalic. Extraocular muscles are intact. Pupils equal and reactive to light. Sclerae anicteric. No conjunctival injection. No oro-pharyngeal erythema.  NECK: Supple. There is no jugular venous distention. No bruits, no lymphadenopathy, no thyromegaly.  HEART: Regular rate and rhythm,. No murmurs, no rubs, no clicks.  LUNGS: Clear to auscultation bilaterally. No rales or rhonchi. No wheezes.  ABDOMEN: Soft, flat, nontender, nondistended. Has good bowel sounds. No hepatosplenomegaly appreciated.  EXTREMITIES: No evidence of any cyanosis, clubbing, or peripheral edema.  +2 pedal and radial pulses bilaterally.  NEUROLOGIC: The patient is alert, awake, and oriented x3 with no focal motor or sensory deficits appreciated bilaterally.  SKIN: Moist and warm with no rashes appreciated.  Psych: Not anxious, depressed LN: No inguinal LN enlargement    Antibiotics   Anti-infectives    Start     Dose/Rate Route Frequency Ordered Stop   12/03/15 1730  levofloxacin (LEVAQUIN) IVPB 500 mg     500 mg 100 mL/hr over 60 Minutes Intravenous Every 24 hours 12/03/15 1716     12/03/15 1530  cefTRIAXone (ROCEPHIN) 1 g in dextrose 5 % 50 mL IVPB     1 g 100 mL/hr over 30 Minutes Intravenous  Once 12/03/15 1526 12/03/15 1643      Medications   Scheduled Meds: . enoxaparin (LOVENOX) injection  40 mg Subcutaneous Q24H  . levofloxacin (LEVAQUIN) IV  500 mg Intravenous Q24H  . nicotine  21 mg Transdermal Daily  . pneumococcal 23 valent vaccine  0.5 mL Intramuscular Tomorrow-1000   Continuous Infusions: . sodium chloride 125 mL/hr at 12/04/15 0530   PRN Meds:.acetaminophen **OR** acetaminophen, morphine injection, ondansetron **OR** ondansetron (ZOFRAN) IV,  oxyCODONE-acetaminophen   Data Review:   Micro Results No results found for this or any previous visit (from the past 240 hour(s)).  Radiology Reports Ct Abdomen Pelvis W Contrast  12/03/2015  CLINICAL DATA:  Flank pain and dysuria for 3 days. EXAM: CT ABDOMEN AND PELVIS WITH CONTRAST TECHNIQUE: Multidetector CT imaging of the abdomen and pelvis was performed using the standard protocol following bolus administration of intravenous contrast. CONTRAST:  ISOVUE-300 IOPAMIDOL (ISOVUE-300) INJECTION 61% COMPARISON:  None. FINDINGS: Lower chest: Lung bases are clear. Small amount of pericardial fluid. Hepatobiliary: Normal appearance of the liver and gallbladder. Portal venous system is patent. Pancreas: Normal appearance of the pancreas without inflammation or duct dilatation. Spleen: Within normal limits in size and appearance. Adrenals/Urinary Tract: There is a 1.4 cm low-density nodule involving the left adrenal gland which is indeterminate but likely an incidental finding based on the size and low density on the delayed images. Small amount of perinephric stranding along the right kidney upper pole. A small amount of perinephric stranding around the left kidney. There is a striated enhancement pattern throughout the left kidney suggestive for pyelonephritis. Striated enhancement along the upper pole of the right kidney is also compatible with pyelonephritis. No evidence for hydronephrosis. Urinary bladder is decompressed. Normal appearance of the right adrenal gland. Stomach/Bowel: No acute abnormality to the stomach, small bowel or colon. Normal appearance of the appendix. No evidence for bowel obstruction. Vascular/Lymphatic: Atherosclerotic disease in the abdominal aorta without aneurysm. Main visceral arteries are patent. Atherosclerotic calcifications and disease in the iliac arteries bilaterally. There is no significant abdominal or pelvic lymphadenopathy Reproductive: Normal appearance of the  uterus and adnexal tissue. Other: No significant free fluid.  No evidence for free air. Musculoskeletal:  No suspicious bone findings. IMPRESSION: Inflammatory changes involving both kidneys are suggestive for bilateral pyelonephritis. Pyelonephritis is more diffuse in the left kidney and localized towards the upper pole on the right kidney. No evidence for a renal abscess. No hydronephrosis. Electronically Signed   By: Richarda Overlie M.D.   On: 12/03/2015 16:32     CBC  Recent Labs Lab  12/03/15 1426 12/04/15 0336  WBC 24.3* 21.8*  HGB 14.2 11.1*  HCT 42.5 33.2*  PLT 296 241  MCV 90.6 90.9  MCH 30.2 30.4  MCHC 33.3 33.5  RDW 13.7 13.4  LYMPHSABS 1.8  --   MONOABS 0.6  --   EOSABS 0.0  --   BASOSABS 0.1  --     Chemistries   Recent Labs Lab 12/03/15 1426 12/03/15 1854 12/04/15 0336  NA 139  --  138  K 3.5  --  3.2*  CL 105  --  108  CO2 24  --  23  GLUCOSE 122*  --  113*  BUN 11  --  10  CREATININE 1.00  --  0.78  CALCIUM 11.5* 10.3* 9.9  AST 27  --   --   ALT 26  --   --   ALKPHOS 87  --   --   BILITOT 0.6  --   --    ------------------------------------------------------------------------------------------------------------------ estimated creatinine clearance is 76.4 mL/min (by C-G formula based on Cr of 0.78). ------------------------------------------------------------------------------------------------------------------ No results for input(s): HGBA1C in the last 72 hours. ------------------------------------------------------------------------------------------------------------------ No results for input(s): CHOL, HDL, LDLCALC, TRIG, CHOLHDL, LDLDIRECT in the last 72 hours. ------------------------------------------------------------------------------------------------------------------  Recent Labs  12/03/15 1854  TSH 3.908   ------------------------------------------------------------------------------------------------------------------ No results for  input(s): VITAMINB12, FOLATE, FERRITIN, TIBC, IRON, RETICCTPCT in the last 72 hours.  Coagulation profile No results for input(s): INR, PROTIME in the last 168 hours.  No results for input(s): DDIMER in the last 72 hours.  Cardiac Enzymes No results for input(s): CKMB, TROPONINI, MYOGLOBIN in the last 168 hours.  Invalid input(s): CK ------------------------------------------------------------------------------------------------------------------ Invalid input(s): POCBNP    Assessment & Plan   1. Pyelonephritis, leukocytosis, left flank pain. Has failed outpatient therapy Urine cultures currently pending Continue levofloxacin WBC has trended down we'll repeat a CBC in the morning Continue IV fluids Continue pain control  2. Hypercalcemia likely secondary to dehydration. 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 Outpatient follow-up 4. History of asthma. Respiratory status stable 5. Tobacco abuse smoking cessation counseling      Code Status Orders        Start     Ordered   12/03/15 1716  Full code   Continuous     12/03/15 1715    Code Status History    Date Active Date Inactive Code Status Order ID Comments User Context   This patient has a current code status but no historical code status.           Consults  none DVT Prophylaxis  Lovenox   Lab Results  Component Value Date   PLT 241 12/04/2015     Time Spent in minutes  35 minutes  Greater than 50% of time spent in care coordination and counseling patient regarding the condition and plan of care.   Auburn BilberryPATEL, Shelitha Magley M.D on 12/04/2015 at 1:08 PM  Between 7am to 6pm - Pager - [phone]  After 6pm go to [email or web address] - password [email or web address] [facility]  [facility]   Office  [phone]

## 2015-12-04 NOTE — Progress Notes (Signed)
Patient O2 sats dropped to 77% post exertion and slight SOB. Called Dr. Allena KatzPatel and ordered Harris to keep O2@ 90%. Orders received. Patient is not on acute distress at this time.

## 2015-12-05 LAB — URINE CULTURE: Culture: >=100000 — AB

## 2015-12-05 MED ORDER — ALUM & MAG HYDROXIDE-SIMETH 200-200-20 MG/5ML PO SUSP
30.0000 mL | ORAL | Status: DC | PRN
  Administered 2015-12-05: 30 mL via ORAL
  Filled 2015-12-05: qty 30

## 2015-12-05 MED ORDER — ENOXAPARIN SODIUM 40 MG/0.4ML ~~LOC~~ SOLN
40.0000 mg | SUBCUTANEOUS | Status: DC
  Administered 2015-12-05: 40 mg via SUBCUTANEOUS
  Filled 2015-12-05: qty 0.4

## 2015-12-05 MED ORDER — POTASSIUM CHLORIDE CRYS ER 20 MEQ PO TBCR
40.0000 meq | EXTENDED_RELEASE_TABLET | Freq: Once | ORAL | Status: AC
  Administered 2015-12-05: 40 meq via ORAL
  Filled 2015-12-05: qty 2

## 2015-12-05 MED ORDER — DEXTROSE 5 % IV SOLN
2.0000 g | INTRAVENOUS | Status: DC
  Administered 2015-12-05: 2 g via INTRAVENOUS
  Filled 2015-12-05 (×2): qty 2

## 2015-12-05 NOTE — Progress Notes (Signed)
[facility] [place] [facility]                                                                                                                                                                                            Patient Demographics   Sheila [name], is a 55 y.o. female, DOB - 1960-10-03, AVW:[id number]  Admit date - 12/03/2015   Admitting Physician Alford Highland, MD  Outpatient Primary MD for the patient is No primary care provider on file.   LOS - 2  Subjective:  Pt is breathing is improved. She is currently on room air she diuresed well with Lasix still has some pain in the flank    Review of Systems:   CONSTITUTIONAL: No documented fever. No fatigue, weakness. No weight gain, no weight loss.  EYES: No blurry or double vision.  ENT: No tinnitus. No postnasal drip. No redness of the oropharynx.  RESPIRATORY: No cough, no wheeze, no hemoptysis. No dyspnea.  CARDIOVASCULAR: No chest pain. No orthopnea. No palpitations. No syncope.  GASTROINTESTINAL: No nausea, no vomiting or diarrhea. No abdominal pain. No melena or hematochezia.  GENITOURINARY: Positive dysuria or no hematuria.  ENDOCRINE: No polyuria or nocturia. No heat or cold intolerance.  HEMATOLOGY: No anemia. No bruising. No bleeding.  INTEGUMENTARY: No rashes. No lesions.  MUSCULOSKELETAL: No arthritis. No swelling. No gout. + flank pain NEUROLOGIC: No numbness, tingling, or ataxia. No seizure-type activity.  PSYCHIATRIC: No anxiety. No insomnia. No ADD.    Vitals:   Filed Vitals:   12/05/15 0800 12/05/15 0830 12/05/15 0933 12/05/15 1225  BP:    140/72  Pulse:    94  Temp:    98.5 F (36.9 C)  TempSrc:    Oral  Resp:    19  Height:      Weight:      SpO2: 96% 94% 95% 96%    Wt Readings from Last 3 Encounters:  12/03/15 77.111 kg (170 lb)  11/10/15 81.647 kg (180 lb)     Intake/Output Summary (Last 24 hours) at 12/05/15 1446 Last data filed at 12/05/15 1200  Gross per  24 hour  Intake   1357 ml  Output    900 ml  Net    457 ml    Physical Exam:   GENERAL: Pleasant-appearing in no apparent distress.  HEAD, EYES, EARS, NOSE AND THROAT: Atraumatic, normocephalic. Extraocular muscles are intact. Pupils equal and reactive to light. Sclerae anicteric. No conjunctival injection. No oro-pharyngeal erythema.  NECK: Supple. There is no jugular venous distention. No bruits, no lymphadenopathy, no thyromegaly.  HEART: Regular rate and rhythm,. No  murmurs, no rubs, no clicks.  LUNGS: Clear to auscultation bilaterally. No rales or rhonchi. No wheezes.  ABDOMEN: Soft, flat, nontender, nondistended. Has good bowel sounds. No hepatosplenomegaly appreciated.  EXTREMITIES: No evidence of any cyanosis, clubbing, or peripheral edema.  +2 pedal and radial pulses bilaterally.  NEUROLOGIC: The patient is alert, awake, and oriented x3 with no focal motor or sensory deficits appreciated bilaterally.  SKIN: Moist and warm with no rashes appreciated.  Psych: Not anxious, depressed LN: No inguinal LN enlargement    Antibiotics   Anti-infectives    Start     Dose/Rate Route Frequency Ordered Stop   12/05/15 1430  cefTRIAXone (ROCEPHIN) 2 g in dextrose 5 % 50 mL IVPB     2 g 100 mL/hr over 30 Minutes Intravenous Every 24 hours 12/05/15 1430     12/04/15 1500  meropenem (MERREM) 1 g in sodium chloride 0.9 % 100 mL IVPB  Status:  Discontinued     1 g 200 mL/hr over 30 Minutes Intravenous Every 8 hours 12/04/15 1425 12/05/15 1430   12/03/15 1730  levofloxacin (LEVAQUIN) IVPB 500 mg  Status:  Discontinued     500 mg 100 mL/hr over 60 Minutes Intravenous Every 24 hours 12/03/15 1716 12/04/15 1425   12/03/15 1530  cefTRIAXone (ROCEPHIN) 1 g in dextrose 5 % 50 mL IVPB     1 g 100 mL/hr over 30 Minutes Intravenous  Once 12/03/15 1526 12/03/15 1643      Medications   Scheduled Meds: . cefTRIAXone (ROCEPHIN)  IV  2 g Intravenous Q24H  . enoxaparin (LOVENOX) injection  40 mg  Subcutaneous Q24H  . nicotine  21 mg Transdermal Daily  . pneumococcal 23 valent vaccine  0.5 mL Intramuscular Tomorrow-1000   Continuous Infusions:   PRN Meds:.acetaminophen **OR** acetaminophen, ipratropium-albuterol, morphine injection, ondansetron **OR** ondansetron (ZOFRAN) IV, oxyCODONE-acetaminophen   Data Review:   Micro Results Recent Results (from the past 240 hour(s))  Urine culture     Status: Abnormal (Preliminary result)   Collection Time: 12/03/15  1:26 PM  Result Value Ref Range Status   Specimen Description URINE, CLEAN CATCH  Final   Special Requests Normal  Final   Culture (A)  Final    >=100,000 COLONIES/mL GRAM NEGATIVE RODS IDENTIFICATION AND SUSCEPTIBILITIES TO FOLLOW    Report Status PENDING  Incomplete  Blood culture (routine x 2)     Status: Abnormal (Preliminary result)   Collection Time: 12/03/15  3:41 PM  Result Value Ref Range Status   Specimen Description BLOOD LEFT HAND  Final   Special Requests   Final    BOTTLES DRAWN AEROBIC AND ANAEROBIC  AERO 15CC ANA 10CC   Culture  Setup Time   Final    GRAM NEGATIVE RODS ANAEROBIC BOTTLE ONLY CRITICAL RESULT CALLED TO, READ BACK BY AND VERIFIED WITH: KAREN HAYES 12/04/15 1408 MLM    Culture (A)  Final    ESCHERICHIA COLI ANAEROBIC BOTTLE ONLY SUSCEPTIBILITIES TO FOLLOW    Report Status PENDING  Incomplete  Blood Culture ID Panel (Reflexed)     Status: Abnormal   Collection Time: 12/03/15  3:41 PM  Result Value Ref Range Status   Enterococcus species NOT DETECTED NOT DETECTED Final   Vancomycin resistance NOT DETECTED NOT DETECTED Final   Listeria monocytogenes NOT DETECTED NOT DETECTED Final   Staphylococcus species NOT DETECTED NOT DETECTED Final   Staphylococcus aureus NOT DETECTED NOT DETECTED Final   Methicillin resistance NOT DETECTED NOT DETECTED Final   Streptococcus species  NOT DETECTED NOT DETECTED Final   Streptococcus agalactiae NOT DETECTED NOT DETECTED Final   Streptococcus  pneumoniae NOT DETECTED NOT DETECTED Final   Streptococcus pyogenes NOT DETECTED NOT DETECTED Final   Acinetobacter baumannii NOT DETECTED NOT DETECTED Final   Enterobacteriaceae species DETECTED (A) NOT DETECTED Final    Comment: CRITICAL RESULT CALLED TO, READ BACK BY AND VERIFIED WITH: KAREN HAYES 12/04/15 1408 MLM    Enterobacter cloacae complex NOT DETECTED NOT DETECTED Final   Escherichia coli DETECTED (A) NOT DETECTED Final    Comment: CRITICAL RESULT CALLED TO, READ BACK BY AND VERIFIED WITH: KAREN HAYES 12/04/15 1408 MLM    Klebsiella oxytoca NOT DETECTED NOT DETECTED Final   Klebsiella pneumoniae NOT DETECTED NOT DETECTED Final   Proteus species NOT DETECTED NOT DETECTED Final   Serratia marcescens NOT DETECTED NOT DETECTED Final   Carbapenem resistance NOT DETECTED NOT DETECTED Final   Haemophilus influenzae NOT DETECTED NOT DETECTED Final   Neisseria meningitidis NOT DETECTED NOT DETECTED Final   Pseudomonas aeruginosa NOT DETECTED NOT DETECTED Final   Candida albicans NOT DETECTED NOT DETECTED Final   Candida glabrata NOT DETECTED NOT DETECTED Final   Candida krusei NOT DETECTED NOT DETECTED Final   Candida parapsilosis NOT DETECTED NOT DETECTED Final   Candida tropicalis NOT DETECTED NOT DETECTED Final  Blood culture (routine x 2)     Status: None (Preliminary result)   Collection Time: 12/03/15  3:42 PM  Result Value Ref Range Status   Specimen Description BLOOD RIGHT ASSIST CONTROL  Final   Special Requests BOTTLES DRAWN AEROBIC AND ANAEROBIC  5CC  Final   Culture NO GROWTH 2 DAYS  Final   Report Status PENDING  Incomplete    Radiology Reports Dg Chest 2 View  12/04/2015  CLINICAL DATA:  Pyelonephritis, shortness of breath today, on oxygen. EXAM: CHEST  2 VIEW COMPARISON:  12/04/2013. FINDINGS: Trachea is midline. Heart size stable. Mild basilar airspace opacification, left greater than right. No pleural fluid. IMPRESSION: Basilar airspace opacification, left greater  than right, suspicious for pneumonia. Pulmonary edema not excluded. Electronically Signed   By: Leanna BattlesMelinda  Blietz M.D.   On: 12/04/2015 14:49   Ct Abdomen Pelvis W Contrast  12/03/2015  CLINICAL DATA:  Flank pain and dysuria for 3 days. EXAM: CT ABDOMEN AND PELVIS WITH CONTRAST TECHNIQUE: Multidetector CT imaging of the abdomen and pelvis was performed using the standard protocol following bolus administration of intravenous contrast. CONTRAST:  100mL ISOVUE-300 IOPAMIDOL (ISOVUE-300) INJECTION 61% COMPARISON:  None. FINDINGS: Lower chest: Lung bases are clear. Small amount of pericardial fluid. Hepatobiliary: Normal appearance of the liver and gallbladder. Portal venous system is patent. Pancreas: Normal appearance of the pancreas without inflammation or duct dilatation. Spleen: Within normal limits in size and appearance. Adrenals/Urinary Tract: There is a 1.4 cm low-density nodule involving the left adrenal gland which is indeterminate but likely an incidental finding based on the size and low density on the delayed images. Small amount of perinephric stranding along the right kidney upper pole. A small amount of perinephric stranding around the left kidney. There is a striated enhancement pattern throughout the left kidney suggestive for pyelonephritis. Striated enhancement along the upper pole of the right kidney is also compatible with pyelonephritis. No evidence for hydronephrosis. Urinary bladder is decompressed. Normal appearance of the right adrenal gland. Stomach/Bowel: No acute abnormality to the stomach, small bowel or colon. Normal appearance of the appendix. No evidence for bowel obstruction. Vascular/Lymphatic: Atherosclerotic disease in the abdominal aorta  without aneurysm. Main visceral arteries are patent. Atherosclerotic calcifications and disease in the iliac arteries bilaterally. There is no significant abdominal or pelvic lymphadenopathy Reproductive: Normal appearance of the uterus and  adnexal tissue. Other: No significant free fluid.  No evidence for free air. Musculoskeletal:  No suspicious bone findings. IMPRESSION: Inflammatory changes involving both kidneys are suggestive for bilateral pyelonephritis. Pyelonephritis is more diffuse in the left kidney and localized towards the upper pole on the right kidney. No evidence for a renal abscess. No hydronephrosis. Electronically Signed   By: Richarda Overlie M.D.   On: 12/03/2015 16:32     CBC  Recent Labs Lab 12/03/15 1426 12/04/15 0336  WBC 24.3* 21.8*  HGB 14.2 11.1*  HCT 42.5 33.2*  PLT 296 241  MCV 90.6 90.9  MCH 30.2 30.4  MCHC 33.3 33.5  RDW 13.7 13.4  LYMPHSABS 1.8  --   MONOABS 0.6  --   EOSABS 0.0  --   BASOSABS 0.1  --     Chemistries   Recent Labs Lab 12/03/15 1426 12/03/15 1854 12/04/15 0336  NA 139  --  138  K 3.5  --  3.2*  CL 105  --  108  CO2 24  --  23  GLUCOSE 122*  --  113*  BUN 11  --  10  CREATININE 1.00  --  0.78  CALCIUM 11.5* 10.3* 9.9  AST 27  --   --   ALT 26  --   --   ALKPHOS 87  --   --   BILITOT 0.6  --   --    ------------------------------------------------------------------------------------------------------------------ estimated creatinine clearance is 76.4 mL/min (by C-G formula based on Cr of 0.78). ------------------------------------------------------------------------------------------------------------------ No results for input(s): HGBA1C in the last 72 hours. ------------------------------------------------------------------------------------------------------------------ No results for input(s): CHOL, HDL, LDLCALC, TRIG, CHOLHDL, LDLDIRECT in the last 72 hours. ------------------------------------------------------------------------------------------------------------------  Recent Labs  12/03/15 1854  TSH 3.908   ------------------------------------------------------------------------------------------------------------------ No results for input(s):  VITAMINB12, FOLATE, FERRITIN, TIBC, IRON, RETICCTPCT in the last 72 hours.  Coagulation profile No results for input(s): INR, PROTIME in the last 168 hours.  No results for input(s): DDIMER in the last 72 hours.  Cardiac Enzymes No results for input(s): CKMB, TROPONINI, MYOGLOBIN in the last 168 hours.  Invalid input(s): CK ------------------------------------------------------------------------------------------------------------------ Invalid input(s): POCBNP    Assessment & Plan   1. Pyelonephritis, leukocytosis, left flank pain. Has failed outpatient therapy I called lab day states that gram-negative rods are growing, blood cultures are currently showing gram-negative rods with Escherichia coli. WBC count continues to be elevated I will ask infectious disease doctor to see Continue antibiotics for now WBC has trended down we'll repeat a CBC in the morning Continue IV fluids Continue pain control 2. Hypercalcemia likely secondary to dehydration. 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 Outpatient follow-up 4. History of asthma. Respiratory status stable 5. Tobacco abuse smoking cessation counseling      Code Status Orders        Start     Ordered   12/03/15 1716  Full code   Continuous     12/03/15 1715    Code Status History    Date Active Date Inactive Code Status Order ID Comments User Context   This patient has a current code status but no historical code status.           Consults  none DVT Prophylaxis  Lovenox  Lab Results  Component Value Date   PLT 241 12/04/2015     Time Spent in minutes  32 minutes  Greater than 50% of time spent in care coordination and counseling patient regarding the condition and plan of care.   Auburn Bilberry M.D on 12/05/2015 at 2:46 PM  Between 7am to 6pm - Pager - [phone]  After 6pm go to [email or web address] -  password [email or web address] [facility]  [facility]   Office  [phone]

## 2015-12-05 NOTE — Consult Note (Signed)
[facility] Infectious Disease     Reason for Consult:UTI, Leukocytosis    Referring Physician: Serita Grit Date of Admission:  12/03/2015   Active Problems:   Pyelonephritis   HPI: Sheila [name] is a 55 y.o. female admitted 4/26 with back pain, difficulty urinating fevers and chills. She had recent ED visit for UTI and dxed with UTI with cx growing Shigella Sonnei (although now identified and corrected as E coli). She was treated with bactrim and finished a course. On admit her wbc was 24 and CT scan showed probable pyelonephritis bilaterally. She was started on IV CTX but is now on meropenem.  BCX + e coli, UCX > 100 K GNR. UA with 6-30wbc but had WBC clumps  Renal fxn nnml.  She then developed SOB and cxr shows possible PNA vx pulm edema  Currently breathing better, no fevers, chills or back pain. Urinating better. Walking halls   Past Medical History  Diagnosis Date  . Asthma   . Hypertension    Past Surgical History  Procedure Laterality Date  . Left ankle surgery    . Left ankle hardware removal     Social History  Substance Use Topics  . Smoking status: Current Every Day Smoker -- 1.00 packs/day    Types: Cigarettes  . Smokeless tobacco: None  . Alcohol Use: No   Family History  Problem Relation Age of Onset  . COPD Mother   . Heart failure Mother   . COPD Father   . Heart failure Father     Allergies:  Allergies  Allergen Reactions  . Vicodin [Hydrocodone-Acetaminophen] Nausea And Vomiting  . Tramadol Palpitations    Current antibiotics: Antibiotics Given (last 72 hours)    Date/Time Action Medication Dose Rate   12/03/15 1902 Given   levofloxacin (LEVAQUIN) IVPB 500 mg 500 mg 100 mL/hr   12/04/15 1512 Given   meropenem (MERREM) 1 g in sodium chloride 0.9 % 100 mL IVPB 1 g 200 mL/hr   12/04/15 2216 Given   meropenem (MERREM) 1 g in sodium chloride 0.9 % 100 mL IVPB 1 g 200 mL/hr   12/05/15 0522 Given   meropenem (MERREM) 1 g in sodium chloride 0.9  % 100 mL IVPB 1 g 200 mL/hr   12/05/15 1400 Given   meropenem (MERREM) 1 g in sodium chloride 0.9 % 100 mL IVPB 1 g 200 mL/hr      MEDICATIONS: . enoxaparin (LOVENOX) injection  40 mg Subcutaneous Q24H  . meropenem (MERREM) IV  1 g Intravenous Q8H  . nicotine  21 mg Transdermal Daily  . pneumococcal 23 valent vaccine  0.5 mL Intramuscular Tomorrow-1000    Review of Systems - 11 systems reviewed and negative per HPI   OBJECTIVE: Temp:  [98 F (36.7 C)-99.2 F (37.3 C)] 98.5 F (36.9 C) (04/28 1225) Pulse Rate:  [94-109] 94 (04/28 1225) Resp:  [18-20] 19 (04/28 1225) BP: (139-166)/(69-83) 140/72 mmHg (04/28 1225) SpO2:  [90 %-97 %] 96 % (04/28 1225) Physical Exam  Constitutional:  oriented to person, place, and time. appears well-developed and well-nourished. No distress.  HENT: [place]/AT, PERRLA, no scleral icterus Mouth/Throat: Oropharynx is clear and moist. No oropharyngeal exudate.  Cardiovascular: Normal rate, regular rhythm and normal heart sounds. Exam reveals no gallop and no friction rub.  No murmur heard.  Pulmonary/Chest: Effort normal and breath sounds normal. No respiratory distress.  has no wheezes.  Neck = supple, no nuchal rigidity Abdominal: Soft. Bowel sounds are normal.  exhibits no  distension. There is no tenderness.  Lymphadenopathy: no cervical adenopathy. No axillary adenopathy Neurological: alert and oriented to person, place, and time.  Skin: Skin is warm and dry. No rash noted. No erythema.  Psychiatric: a normal mood and affect.  behavior is normal.    LABS: Results for orders placed or performed during the hospital encounter of 12/03/15 (from the past 48 hour(s))  CBC with Differential/Platelet     Status: Abnormal   Collection Time: 12/03/15  2:26 PM  Result Value Ref Range   WBC 24.3 (H) 3.6 - 11.0 K/uL   RBC 4.69 3.80 - 5.20 MIL/uL   Hemoglobin 14.2 12.0 - 16.0 g/dL   HCT 42.5 35.0 - 47.0 %   MCV 90.6 80.0 - 100.0 fL   MCH 30.2 26.0 - 34.0 pg    MCHC 33.3 32.0 - 36.0 g/dL   RDW 13.7 11.5 - 14.5 %   Platelets 296 150 - 440 K/uL   Neutrophils Relative % 90 %   Neutro Abs 21.7 (H) 1.4 - 6.5 K/uL   Lymphocytes Relative 7 %   Lymphs Abs 1.8 1.0 - 3.6 K/uL   Monocytes Relative 2 %   Monocytes Absolute 0.6 0.2 - 0.9 K/uL   Eosinophils Relative 0 %   Eosinophils Absolute 0.0 0 - 0.7 K/uL   Basophils Relative 1 %   Basophils Absolute 0.1 0 - 0.1 K/uL  Comprehensive metabolic panel     Status: Abnormal   Collection Time: 12/03/15  2:26 PM  Result Value Ref Range   Sodium 139 135 - 145 mmol/L   Potassium 3.5 3.5 - 5.1 mmol/L   Chloride 105 101 - 111 mmol/L   CO2 24 22 - 32 mmol/L   Glucose, Bld 122 (H) 65 - 99 mg/dL   BUN 11 6 - 20 mg/dL   Creatinine, Ser 1.00 0.44 - 1.00 mg/dL   Calcium 11.5 (H) 8.9 - 10.3 mg/dL   Total Protein 7.7 6.5 - 8.1 g/dL   Albumin 3.9 3.5 - 5.0 g/dL   AST 27 15 - 41 U/L   ALT 26 14 - 54 U/L   Alkaline Phosphatase 87 38 - 126 U/L   Total Bilirubin 0.6 0.3 - 1.2 mg/dL   GFR calc non Af Amer >60 >60 mL/min   GFR calc Af Amer >60 >60 mL/min    Comment: (NOTE) The eGFR has been calculated using the CKD EPI equation. This calculation has not been validated in all clinical situations. eGFR's persistently <60 mL/min signify possible Chronic Kidney Disease.    Anion gap 10 5 - 15  Blood culture (routine x 2)     Status: Abnormal (Preliminary result)   Collection Time: 12/03/15  3:41 PM  Result Value Ref Range   Specimen Description BLOOD LEFT HAND    Special Requests      BOTTLES DRAWN AEROBIC AND ANAEROBIC  AERO [place] ANA 10CC   Culture  Setup Time      GRAM NEGATIVE RODS ANAEROBIC BOTTLE ONLY CRITICAL RESULT CALLED TO, READ BACK BY AND VERIFIED WITH: KAREN HAYES 12/04/15 1408 MLM    Culture (A)     ESCHERICHIA COLI ANAEROBIC BOTTLE ONLY SUSCEPTIBILITIES TO FOLLOW    Report Status PENDING   Blood Culture ID Panel (Reflexed)     Status: Abnormal   Collection Time: 12/03/15  3:41 PM  Result  Value Ref Range   Enterococcus species NOT DETECTED NOT DETECTED   Vancomycin resistance NOT DETECTED NOT DETECTED   Listeria monocytogenes  NOT DETECTED NOT DETECTED   Staphylococcus species NOT DETECTED NOT DETECTED   Staphylococcus aureus NOT DETECTED NOT DETECTED   Methicillin resistance NOT DETECTED NOT DETECTED   Streptococcus species NOT DETECTED NOT DETECTED   Streptococcus agalactiae NOT DETECTED NOT DETECTED   Streptococcus pneumoniae NOT DETECTED NOT DETECTED   Streptococcus pyogenes NOT DETECTED NOT DETECTED   Acinetobacter baumannii NOT DETECTED NOT DETECTED   Enterobacteriaceae species DETECTED (A) NOT DETECTED    Comment: CRITICAL RESULT CALLED TO, READ BACK BY AND VERIFIED WITH: KAREN HAYES 12/04/15 1408 MLM    Enterobacter cloacae complex NOT DETECTED NOT DETECTED   Escherichia coli DETECTED (A) NOT DETECTED    Comment: CRITICAL RESULT CALLED TO, READ BACK BY AND VERIFIED WITH: KAREN HAYES 12/04/15 1408 MLM    Klebsiella oxytoca NOT DETECTED NOT DETECTED   Klebsiella pneumoniae NOT DETECTED NOT DETECTED   Proteus species NOT DETECTED NOT DETECTED   Serratia marcescens NOT DETECTED NOT DETECTED   Carbapenem resistance NOT DETECTED NOT DETECTED   Haemophilus influenzae NOT DETECTED NOT DETECTED   Neisseria meningitidis NOT DETECTED NOT DETECTED   Pseudomonas aeruginosa NOT DETECTED NOT DETECTED   Candida albicans NOT DETECTED NOT DETECTED   Candida glabrata NOT DETECTED NOT DETECTED   Candida krusei NOT DETECTED NOT DETECTED   Candida parapsilosis NOT DETECTED NOT DETECTED   Candida tropicalis NOT DETECTED NOT DETECTED  Blood culture (routine x 2)     Status: None (Preliminary result)   Collection Time: 12/03/15  3:42 PM  Result Value Ref Range   Specimen Description BLOOD RIGHT ASSIST CONTROL    Special Requests BOTTLES DRAWN AEROBIC AND ANAEROBIC  5CC    Culture NO GROWTH 2 DAYS    Report Status PENDING   PTH, intact and calcium     Status: Abnormal    Collection Time: 12/03/15  6:54 PM  Result Value Ref Range   PTH 58 15 - 65 pg/mL   Calcium, Total (PTH) 10.3 (H) 8.7 - 10.2 mg/dL   PTH Comment     Comment: (NOTE) Interpretation                 Intact PTH    Calcium                                (pg/mL)      (mg/dL) Normal                          15 - 65     8.6 - 10.2 Primary Hyperparathyroidism         >65          >10.2 Secondary Hyperparathyroidism       >65          <10.2 Non-Parathyroid Hypercalcemia       <65          >10.2 Hypoparathyroidism                  <15          < 8.6 Non-Parathyroid Hypocalcemia    15 - 65          < 8.6 Performed At: [facility] [address], [place] [id number] Lindon Romp MD DG:[id number]   TSH     Status: None   Collection Time: 12/03/15  6:54 PM  Result Value Ref Range   TSH 3.908  0.350 - 4.500 uIU/mL  VITAMIN D 25 Hydroxy (Vit-D Deficiency, Fractures)     Status: Abnormal   Collection Time: 12/03/15  6:54 PM  Result Value Ref Range   Vit D, 25-Hydroxy 19.0 (L) 30.0 - 100.0 ng/mL    Comment: (NOTE) Vitamin D deficiency has been defined by the [place] practice guideline as a level of serum 25-OH vitamin D less than 20 ng/mL (1,2). The Endocrine Society went on to further define vitamin D insufficiency as a level between 21 and 29 ng/mL (2). 1. IOM (Institute of Medicine). 2010. Dietary reference   intakes for calcium and D. [place]: The   [employer]. 2. Holick MF, Binkley [place], Bischoff-Ferrari HA, et al.   Evaluation, treatment, and prevention of vitamin D   deficiency: an Endocrine Society clinical practice   guideline. JCEM. 2011 Jul; 96(7):1911-30. Performed At: [facility] [place], [place] [id number] Lindon Romp MD UL:[id number]   Basic metabolic panel     Status: Abnormal   Collection Time: 12/04/15  3:36 AM  Result Value Ref Range   Sodium 138 135 - 145 mmol/L    Potassium 3.2 (L) 3.5 - 5.1 mmol/L   Chloride 108 101 - 111 mmol/L   CO2 23 22 - 32 mmol/L   Glucose, Bld 113 (H) 65 - 99 mg/dL   BUN 10 6 - 20 mg/dL   Creatinine, Ser 0.78 0.44 - 1.00 mg/dL   Calcium 9.9 8.9 - 10.3 mg/dL   GFR calc non Af Amer >60 >60 mL/min   GFR calc Af Amer >60 >60 mL/min    Comment: (NOTE) The eGFR has been calculated using the CKD EPI equation. This calculation has not been validated in all clinical situations. eGFR's persistently <60 mL/min signify possible Chronic Kidney Disease.    Anion gap 7 5 - 15  CBC     Status: Abnormal   Collection Time: 12/04/15  3:36 AM  Result Value Ref Range   WBC 21.8 (H) 3.6 - 11.0 K/uL   RBC 3.65 (L) 3.80 - 5.20 MIL/uL   Hemoglobin 11.1 (L) 12.0 - 16.0 g/dL   HCT 33.2 (L) 35.0 - 47.0 %   MCV 90.9 80.0 - 100.0 fL   MCH 30.4 26.0 - 34.0 pg   MCHC 33.5 32.0 - 36.0 g/dL   RDW 13.4 11.5 - 14.5 %   Platelets 241 150 - 440 K/uL  Brain natriuretic peptide     Status: Abnormal   Collection Time: 12/04/15  2:30 PM  Result Value Ref Range   B Natriuretic Peptide 328.0 (H) 0.0 - 100.0 pg/mL   No components found for: ESR, C REACTIVE PROTEIN MICRO: Recent Results (from the past 720 hour(s))  Urine culture     Status: Abnormal   Collection Time: 11/10/15  6:58 PM  Result Value Ref Range Status   Specimen Description URINE, RANDOM  Final   Special Requests NONE  Final   Culture (A)  Final    >=100,000 COLONIES/mL ESCHERICHIA COLI CRITICAL RESULT CALLED TO, READ BACK BY AND VERIFIED WITH: BRANDY DAVIS,RN 11/15/2015 1527 BY JRS. NOT SHIGELLA RESULTS BY [facility] IN [place], [place]    Report Status 12/05/2015 FINAL  Final   Organism ID, Bacteria ESCHERICHIA COLI (A)  Final      Susceptibility   Escherichia coli - MIC*    AMPICILLIN <=2 SENSITIVE Sensitive     LEVOFLOXACIN <=0.12 SENSITIVE Sensitive  TRIMETH/SULFA <=20 SENSITIVE Sensitive     * >=100,000 COLONIES/mL ESCHERICHIA COLI  Urine culture     Status:  Abnormal (Preliminary result)   Collection Time: 12/03/15  1:26 PM  Result Value Ref Range Status   Specimen Description URINE, CLEAN CATCH  Final   Special Requests Normal  Final   Culture (A)  Final    >=100,000 COLONIES/mL GRAM NEGATIVE RODS IDENTIFICATION AND SUSCEPTIBILITIES TO FOLLOW    Report Status PENDING  Incomplete  Blood culture (routine x 2)     Status: Abnormal (Preliminary result)   Collection Time: 12/03/15  3:41 PM  Result Value Ref Range Status   Specimen Description BLOOD LEFT HAND  Final   Special Requests   Final    BOTTLES DRAWN AEROBIC AND ANAEROBIC  AERO [place] ANA 10CC   Culture  Setup Time   Final    GRAM NEGATIVE RODS ANAEROBIC BOTTLE ONLY CRITICAL RESULT CALLED TO, READ BACK BY AND VERIFIED WITH: KAREN HAYES 12/04/15 1408 MLM    Culture (A)  Final    ESCHERICHIA COLI ANAEROBIC BOTTLE ONLY SUSCEPTIBILITIES TO FOLLOW    Report Status PENDING  Incomplete  Blood Culture ID Panel (Reflexed)     Status: Abnormal   Collection Time: 12/03/15  3:41 PM  Result Value Ref Range Status   Enterococcus species NOT DETECTED NOT DETECTED Final   Vancomycin resistance NOT DETECTED NOT DETECTED Final   Listeria monocytogenes NOT DETECTED NOT DETECTED Final   Staphylococcus species NOT DETECTED NOT DETECTED Final   Staphylococcus aureus NOT DETECTED NOT DETECTED Final   Methicillin resistance NOT DETECTED NOT DETECTED Final   Streptococcus species NOT DETECTED NOT DETECTED Final   Streptococcus agalactiae NOT DETECTED NOT DETECTED Final   Streptococcus pneumoniae NOT DETECTED NOT DETECTED Final   Streptococcus pyogenes NOT DETECTED NOT DETECTED Final   Acinetobacter baumannii NOT DETECTED NOT DETECTED Final   Enterobacteriaceae species DETECTED (A) NOT DETECTED Final    Comment: CRITICAL RESULT CALLED TO, READ BACK BY AND VERIFIED WITH: KAREN HAYES 12/04/15 1408 MLM    Enterobacter cloacae complex NOT DETECTED NOT DETECTED Final   Escherichia coli DETECTED (A) NOT  DETECTED Final    Comment: CRITICAL RESULT CALLED TO, READ BACK BY AND VERIFIED WITH: KAREN HAYES 12/04/15 1408 MLM    Klebsiella oxytoca NOT DETECTED NOT DETECTED Final   Klebsiella pneumoniae NOT DETECTED NOT DETECTED Final   Proteus species NOT DETECTED NOT DETECTED Final   Serratia marcescens NOT DETECTED NOT DETECTED Final   Carbapenem resistance NOT DETECTED NOT DETECTED Final   Haemophilus influenzae NOT DETECTED NOT DETECTED Final   Neisseria meningitidis NOT DETECTED NOT DETECTED Final   Pseudomonas aeruginosa NOT DETECTED NOT DETECTED Final   Candida albicans NOT DETECTED NOT DETECTED Final   Candida glabrata NOT DETECTED NOT DETECTED Final   Candida krusei NOT DETECTED NOT DETECTED Final   Candida parapsilosis NOT DETECTED NOT DETECTED Final   Candida tropicalis NOT DETECTED NOT DETECTED Final  Blood culture (routine x 2)     Status: None (Preliminary result)   Collection Time: 12/03/15  3:42 PM  Result Value Ref Range Status   Specimen Description BLOOD RIGHT ASSIST CONTROL  Final   Special Requests BOTTLES DRAWN AEROBIC AND ANAEROBIC  5CC  Final   Culture NO GROWTH 2 DAYS  Final   Report Status PENDING  Incomplete    IMAGING: Dg Chest 2 View  12/04/2015  CLINICAL DATA:  Pyelonephritis, shortness of breath today, on oxygen. EXAM: CHEST  2 VIEW COMPARISON:  12/04/2013. FINDINGS: Trachea is midline. Heart size stable. Mild basilar airspace opacification, left greater than right. No pleural fluid. IMPRESSION: Basilar airspace opacification, left greater than right, suspicious for pneumonia. Pulmonary edema not excluded. Electronically Signed   By: Lorin Picket M.D.   On: 12/04/2015 14:49   Ct Abdomen Pelvis W Contrast  12/03/2015  CLINICAL DATA:  Flank pain and dysuria for 3 days. EXAM: CT ABDOMEN AND PELVIS WITH CONTRAST TECHNIQUE: Multidetector CT imaging of the abdomen and pelvis was performed using the standard protocol following bolus administration of intravenous  contrast. CONTRAST:  149m ISOVUE-300 IOPAMIDOL (ISOVUE-300) INJECTION 61% COMPARISON:  None. FINDINGS: Lower chest: Lung bases are clear. Small amount of pericardial fluid. Hepatobiliary: Normal appearance of the liver and gallbladder. Portal venous system is patent. Pancreas: Normal appearance of the pancreas without inflammation or duct dilatation. Spleen: Within normal limits in size and appearance. Adrenals/Urinary Tract: There is a 1.4 cm low-density nodule involving the left adrenal gland which is indeterminate but likely an incidental finding based on the size and low density on the delayed images. Small amount of perinephric stranding along the right kidney upper pole. A small amount of perinephric stranding around the left kidney. There is a striated enhancement pattern throughout the left kidney suggestive for pyelonephritis. Striated enhancement along the upper pole of the right kidney is also compatible with pyelonephritis. No evidence for hydronephrosis. Urinary bladder is decompressed. Normal appearance of the right adrenal gland. Stomach/Bowel: No acute abnormality to the stomach, small bowel or colon. Normal appearance of the appendix. No evidence for bowel obstruction. Vascular/Lymphatic: Atherosclerotic disease in the abdominal aorta without aneurysm. Main visceral arteries are patent. Atherosclerotic calcifications and disease in the iliac arteries bilaterally. There is no significant abdominal or pelvic lymphadenopathy Reproductive: Normal appearance of the uterus and adnexal tissue. Other: No significant free fluid.  No evidence for free air. Musculoskeletal:  No suspicious bone findings. IMPRESSION: Inflammatory changes involving both kidneys are suggestive for bilateral pyelonephritis. Pyelonephritis is more diffuse in the left kidney and localized towards the upper pole on the right kidney. No evidence for a renal abscess. No hydronephrosis. Electronically Signed   By: AMarkus DaftM.D.   On:  12/03/2015 16:32    Assessment:   Sheila BARTELSONis a 55y.o. female with E coli Sepsis from bilateral pyelonephritis.  She had previous report of Shigella on UCX from 4/3 but now further identified as E coli (the [employer] machine sometimes cannot differentiate some strains of E coli from Shigella so was sent to state lab and confirmed as E coli). She is clinically improving on meropenem. UCX not yet identified but growing GNR Imaging does not show evidence of renal abscess.  Recommendations Can change meropenem to ceftriaxone for now.  Based on previous UCX the E coli will likely be sensitive to quinolones and bactrim so can likely dc tomorrow on either bactrim DS BID for 21 total days of abx or cipro 500 bid for 21 days She should fu with PCP to ensure she has cleared her UTI following the 21 days.  Thank you very much for allowing me to participate in the care of this patient. Please call with questions.   DCheral Marker FOla Spurr MD

## 2015-12-05 NOTE — Care Management (Signed)
Patient is not in the room at this time.  Patient is listed as self pay patient and potential for discharge today.  [facility] left and room.  RNCM following for discharge medications.  Patient has positive blood cultures. Patient was requrieing acute o2, however patient has been weaned to RA.

## 2015-12-05 NOTE — Progress Notes (Signed)
Pharmacy Antibiotic Note  Sheila [name] is a 55 y.o. female admitted on 12/03/2015 with pyelonephritis, now with BCID identification of e coli in blood culture.  Pharmacy has been consulted for meropenem dosing.  Plan: Discussed cultures with MD. Meropenem recommended for E. Coli per BCID protocol. Will discontinue current orders for levofloxacin and order meropenem 1gm IV Q8H.   Continue to follow and de-escalate as appropriate  Height: 5\' 2"  (157.5 cm) Weight: 170 lb (77.111 kg) IBW/kg (Calculated) : 50.1  Temp (24hrs), Avg:99 F (37.2 C), Min:98 F (36.7 C), Max:100 F (37.8 C)   Recent Labs Lab 12/03/15 1426 12/04/15 0336  WBC 24.3* 21.8*  CREATININE 1.00 0.78    Estimated Creatinine Clearance: 76.4 mL/min (by C-G formula based on Cr of 0.78).    Allergies  Allergen Reactions  . Vicodin [Hydrocodone-Acetaminophen] Nausea And Vomiting  . Tramadol Palpitations    Antimicrobials this admission: Ceftriaxone 4/26 x 1 dose Levaquin 4/26 x 1 dose Meropenem 4/27 >>   Dose adjustments this admission:   Microbiology results: 4/26 BCx: BCID - e.coli 1/2 4/26 UCx: pend   Thank you for allowing pharmacy to be a part of this patient's care.  Tammatha Cobb C 12/05/2015 10:11 AM

## 2015-12-06 ENCOUNTER — Ambulatory Visit: Payer: Self-pay

## 2015-12-06 LAB — CBC
HEMATOCRIT: 38.2 % (ref 35.0–47.0)
HEMOGLOBIN: 12.6 g/dL (ref 12.0–16.0)
MCH: 30.3 pg (ref 26.0–34.0)
MCHC: 32.9 g/dL (ref 32.0–36.0)
MCV: 91.9 fL (ref 80.0–100.0)
Platelets: 302 10*3/uL (ref 150–440)
RBC: 4.15 MIL/uL (ref 3.80–5.20)
RDW: 13.2 % (ref 11.5–14.5)
WBC: 12.7 10*3/uL — ABNORMAL HIGH (ref 3.6–11.0)

## 2015-12-06 LAB — BASIC METABOLIC PANEL
Anion gap: 9 (ref 5–15)
BUN: 16 mg/dL (ref 6–20)
CHLORIDE: 111 mmol/L (ref 101–111)
CO2: 23 mmol/L (ref 22–32)
CREATININE: 0.67 mg/dL (ref 0.44–1.00)
Calcium: 11 mg/dL — ABNORMAL HIGH (ref 8.9–10.3)
GFR calc Af Amer: >60 mL/min (ref >60)
GFR calc non Af Amer: >60 mL/min (ref >60)
Glucose, Bld: 107 mg/dL — ABNORMAL HIGH (ref 65–99)
Potassium: 3.6 mmol/L (ref 3.5–5.1)
Sodium: 143 mmol/L (ref 135–145)

## 2015-12-06 MED ORDER — CIPROFLOXACIN HCL 500 MG PO TABS: 500.0000 mg | ORAL_TABLET | Freq: Two times a day (BID) | ORAL | Status: DC

## 2015-12-06 NOTE — Care Management Note (Signed)
Case Management Note  Patient Details  Name: Sheila [name] MRN: [id number] Date of Birth: 07/29/1961  Subjective/Objective:      Discharge to home with no home health services. [facility]               Action/Plan:   Expected Discharge Date:                  Expected Discharge Plan:     In-House Referral:     Discharge planning Services     Post Acute Care Choice:    Choice offered to:     DME Arranged:    DME Agency:     HH Arranged:    HH Agency:     Status of Service:     Medicare Important Message Given:    Date Medicare IM Given:    Medicare IM give by:    Date Additional Medicare IM Given:    Additional Medicare Important Message give by:     If discussed at Long Length of Stay Meetings, dates discussed:    Additional Comments:  Doyl Bitting A, RN 12/06/2015, 10:03 AM

## 2015-12-06 NOTE — Discharge Summary (Signed)
[facility] [place] at [facility]   PATIENT NAME: Sheila [name]    MR#:  [id number]  DATE OF BIRTH:  04/10/1961  DATE OF ADMISSION:  12/03/2015 ADMITTING PHYSICIAN: Alford Highlandichard Wieting, MD  DATE OF DISCHARGE: 12/06/15  PRIMARY CARE PHYSICIAN: No primary care provider on file.    ADMISSION DIAGNOSIS:  Pyelonephritis [N12]  DISCHARGE DIAGNOSIS:  ECoLI Sepsis Acute  pyelonephritis Leucocytosis Mild hypercalcemia SECONDARY DIAGNOSIS:   Past Medical History  Diagnosis Date  . Asthma   . Hypertension     HOSPITAL COURSE:   1.Ecoli sepsis due to ACUTE Pyelonephritis -presented with leukocytosis, left flank pain and positive UC Has failed outpatient therapy Change to IV rocephin--->to po cipro 500 mg bid for total 21 days Wbc down to 12K, pt afebrile and doing well  2. Hypercalcemia likely secondary to dehydration. Currently stable  PTH normal Pt advised to get PCP and f/u with them as outpt.  3. 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 Outpatient follow-up  4. History of asthma. Respiratory status stable  5. Tobacco abuse smoking cessation counseling   Doing overall well. D/c home CONSULTS OBTAINED:  Treatment Team:  Mick Sellavid P Fitzgerald, MD  DRUG ALLERGIES:   Allergies  Allergen Reactions  . Vicodin [Hydrocodone-Acetaminophen] Nausea And Vomiting  . Tramadol Palpitations    DISCHARGE MEDICATIONS:   Current Discharge Medication List    START taking these medications   Details  ciprofloxacin (CIPRO) 500 MG tablet Take 1 tablet (500 mg total) by mouth 2 (two) times daily. Qty: 36 tablet, Refills: 0      CONTINUE these medications which have NOT CHANGED   Details  ibuprofen (ADVIL,MOTRIN) 200 MG tablet Take 800 mg by mouth every 6 (six) hours as needed for headache or mild pain.        If you experience worsening of your admission symptoms, develop shortness of breath, life threatening emergency, suicidal or homicidal thoughts  you must seek medical attention immediately by calling 911 or calling your MD immediately  if symptoms less severe.  You Must read complete instructions/literature along with all the possible adverse reactions/side effects for all the Medicines you take and that have been prescribed to you. Take any new Medicines after you have completely understood and accept all the possible adverse reactions/side effects.   Please note  You were cared for by a hospitalist during your hospital stay. If you have any questions about your discharge medications or the care you received while you were in the hospital after you are discharged, you can call the unit and asked to speak with the hospitalist on call if the hospitalist that took care of you is not available. Once you are discharged, your primary care physician will handle any further medical issues. Please note that NO REFILLS for any discharge medications will be authorized once you are discharged, as it is imperative that you return to your primary care physician (or establish a relationship with a primary care physician if you do not have one) for your aftercare needs so that they can reassess your need for medications and monitor your lab values. Today   SUBJECTIVE  Can I go home?  VITAL SIGNS:  Blood pressure 155/91, pulse 79, temperature 98 F (36.7 C), temperature source Oral, resp. rate 20, height 5\' 2"  (1.575 m), weight 77.111 kg (170 lb), SpO2 99 %.  I/O:   Intake/Output Summary (Last 24 hours) at 12/06/15 0957 Last data filed at 12/06/15 0743  Gross per 24 hour  Intake    958 ml  Output    400 ml  Net    558 ml    PHYSICAL EXAMINATION:  GENERAL:  55 y.o.-year-old patient lying in the bed with no acute distress.  EYES: Pupils equal, round, reactive to light and accommodation. No scleral icterus. Extraocular muscles intact.  HEENT: Head atraumatic, normocephalic. Oropharynx and nasopharynx clear.  NECK:  Supple, no jugular venous  distention. No thyroid enlargement, no tenderness.  LUNGS: Normal breath sounds bilaterally, no wheezing, rales,rhonchi or crepitation. No use of accessory muscles of respiration.  CARDIOVASCULAR: S1, S2 normal. No murmurs, rubs, or gallops.  ABDOMEN: Soft, non-tender, non-distended. Bowel sounds present. No organomegaly or mass.  EXTREMITIES: No pedal edema, cyanosis, or clubbing.  NEUROLOGIC: Cranial nerves II through XII are intact. Muscle strength 5/5 in all extremities. Sensation intact. Gait not checked.  PSYCHIATRIC: The patient is alert and oriented x 3.  SKIN: No obvious rash, lesion, or ulcer.   DATA REVIEW:   CBC   Recent Labs Lab 12/06/15 0557  WBC 12.7*  HGB 12.6  HCT 38.2  PLT 302    Chemistries   Recent Labs Lab 12/03/15 1426  12/06/15 0557  NA 139  < > 143  K 3.5  < > 3.6  CL 105  < > 111  CO2 24  < > 23  GLUCOSE 122*  < > 107*  BUN 11  < > 16  CREATININE 1.00  < > 0.67  CALCIUM 11.5*  < > 11.0*  AST 27  --   --   ALT 26  --   --   ALKPHOS 87  --   --   BILITOT 0.6  --   --   < > = values in this interval not displayed.  Microbiology Results   Recent Results (from the past 240 hour(s))  Urine culture     Status: Abnormal (Preliminary result)   Collection Time: 12/03/15  1:26 PM  Result Value Ref Range Status   Specimen Description URINE, CLEAN CATCH  Final   Special Requests Normal  Final   Culture (A)  Final    >=100,000 COLONIES/mL GRAM NEGATIVE RODS IDENTIFICATION AND SUSCEPTIBILITIES TO FOLLOW    Report Status PENDING  Incomplete  Blood culture (routine x 2)     Status: Abnormal (Preliminary result)   Collection Time: 12/03/15  3:41 PM  Result Value Ref Range Status   Specimen Description BLOOD LEFT HAND  Final   Special Requests   Final    BOTTLES DRAWN AEROBIC AND ANAEROBIC  AERO 15CC ANA 10CC   Culture  Setup Time   Final    GRAM NEGATIVE RODS ANAEROBIC BOTTLE ONLY CRITICAL RESULT CALLED TO, READ BACK BY AND VERIFIED WITH: KAREN  HAYES 12/04/15 1408 MLM    Culture ESCHERICHIA COLI ANAEROBIC BOTTLE ONLY  (A)  Final   Report Status PENDING  Incomplete   Organism ID, Bacteria ESCHERICHIA COLI  Final      Susceptibility   Escherichia coli - MIC*    AMPICILLIN <=2 SENSITIVE Sensitive     CEFAZOLIN <=4 SENSITIVE Sensitive     CEFEPIME <=1 SENSITIVE Sensitive     CEFTAZIDIME <=1 SENSITIVE Sensitive     CEFTRIAXONE <=1 SENSITIVE Sensitive     CIPROFLOXACIN <=0.25 SENSITIVE Sensitive     GENTAMICIN <=1 SENSITIVE Sensitive     IMIPENEM <=0.25 SENSITIVE Sensitive     TRIMETH/SULFA <=20 SENSITIVE Sensitive     AMPICILLIN/SULBACTAM <=2 SENSITIVE Sensitive  PIP/TAZO <=4 SENSITIVE Sensitive     Extended ESBL NEGATIVE Sensitive     * ESCHERICHIA COLI  Blood Culture ID Panel (Reflexed)     Status: Abnormal   Collection Time: 12/03/15  3:41 PM  Result Value Ref Range Status   Enterococcus species NOT DETECTED NOT DETECTED Final   Vancomycin resistance NOT DETECTED NOT DETECTED Final   Listeria monocytogenes NOT DETECTED NOT DETECTED Final   Staphylococcus species NOT DETECTED NOT DETECTED Final   Staphylococcus aureus NOT DETECTED NOT DETECTED Final   Methicillin resistance NOT DETECTED NOT DETECTED Final   Streptococcus species NOT DETECTED NOT DETECTED Final   Streptococcus agalactiae NOT DETECTED NOT DETECTED Final   Streptococcus pneumoniae NOT DETECTED NOT DETECTED Final   Streptococcus pyogenes NOT DETECTED NOT DETECTED Final   Acinetobacter baumannii NOT DETECTED NOT DETECTED Final   Enterobacteriaceae species DETECTED (A) NOT DETECTED Final    Comment: CRITICAL RESULT CALLED TO, READ BACK BY AND VERIFIED WITH: KAREN HAYES 12/04/15 1408 MLM    Enterobacter cloacae complex NOT DETECTED NOT DETECTED Final   Escherichia coli DETECTED (A) NOT DETECTED Final    Comment: CRITICAL RESULT CALLED TO, READ BACK BY AND VERIFIED WITH: KAREN HAYES 12/04/15 1408 MLM    Klebsiella oxytoca NOT DETECTED NOT DETECTED Final    Klebsiella pneumoniae NOT DETECTED NOT DETECTED Final   Proteus species NOT DETECTED NOT DETECTED Final   Serratia marcescens NOT DETECTED NOT DETECTED Final   Carbapenem resistance NOT DETECTED NOT DETECTED Final   Haemophilus influenzae NOT DETECTED NOT DETECTED Final   Neisseria meningitidis NOT DETECTED NOT DETECTED Final   Pseudomonas aeruginosa NOT DETECTED NOT DETECTED Final   Candida albicans NOT DETECTED NOT DETECTED Final   Candida glabrata NOT DETECTED NOT DETECTED Final   Candida krusei NOT DETECTED NOT DETECTED Final   Candida parapsilosis NOT DETECTED NOT DETECTED Final   Candida tropicalis NOT DETECTED NOT DETECTED Final  Blood culture (routine x 2)     Status: None (Preliminary result)   Collection Time: 12/03/15  3:42 PM  Result Value Ref Range Status   Specimen Description BLOOD RIGHT ASSIST CONTROL  Final   Special Requests BOTTLES DRAWN AEROBIC AND ANAEROBIC  5CC  Final   Culture NO GROWTH 2 DAYS  Final   Report Status PENDING  Incomplete    RADIOLOGY:  Dg Chest 2 View  12/04/2015  CLINICAL DATA:  Pyelonephritis, shortness of breath today, on oxygen. EXAM: CHEST  2 VIEW COMPARISON:  12/04/2013. FINDINGS: Trachea is midline. Heart size stable. Mild basilar airspace opacification, left greater than right. No pleural fluid. IMPRESSION: Basilar airspace opacification, left greater than right, suspicious for pneumonia. Pulmonary edema not excluded. Electronically Signed   By: Leanna Battles M.D.   On: 12/04/2015 14:49     Management plans discussed with the patient, family and they are in agreement.  CODE STATUS:     Code Status Orders        Start     Ordered   12/03/15 1716  Full code   Continuous     12/03/15 1715    Code Status History    Date Active Date Inactive Code Status Order ID Comments User Context   This patient has a current code status but no historical code status.      TOTAL TIME TAKING CARE OF THIS PATIENT 40 minutes.    Alfred Eckley  M.D on 12/06/2015 at 9:57 AM  Between 7am to 6pm - Pager - [phone] After 6pm go  to [email or web address] - password [email or web address] [facility]  [facility]  Office  [phone]  CC: Primary care physician; No primary care provider on file.

## 2015-12-08 LAB — URINE CULTURE
Culture: >=100000 — AB
SPECIAL REQUESTS: NORMAL

## 2015-12-08 LAB — CULTURE, BLOOD (ROUTINE X 2)

## 2015-12-10 LAB — CULTURE, BLOOD (ROUTINE X 2): Culture: NO GROWTH

## 2015-12-11 ENCOUNTER — Ambulatory Visit: Payer: Self-pay | Admitting: Urology

## 2015-12-11 VITALS — BP 146/84 | HR 105 | Ht 62.0 in | Wt 167.0 lb

## 2015-12-11 DIAGNOSIS — IMO0001 Reserved for inherently not codable concepts without codable children: Secondary | ICD-10-CM

## 2015-12-11 DIAGNOSIS — I10 Essential (primary) hypertension: Secondary | ICD-10-CM | POA: Insufficient documentation

## 2015-12-11 DIAGNOSIS — Z8719 Personal history of other diseases of the digestive system: Secondary | ICD-10-CM

## 2015-12-11 DIAGNOSIS — N12 Tubulo-interstitial nephritis, not specified as acute or chronic: Secondary | ICD-10-CM

## 2015-12-11 MED ORDER — LISINOPRIL 20 MG PO TABS: 20.0000 mg | ORAL_TABLET | Freq: Every day | ORAL | Status: DC

## 2015-12-11 MED ORDER — ALBUTEROL SULFATE HFA 108 (90 BASE) MCG/ACT IN AERS: 2.0000 | INHALATION_SPRAY | Freq: Four times a day (QID) | RESPIRATORY_TRACT | Status: DC | PRN

## 2015-12-11 MED ORDER — ESOMEPRAZOLE MAGNESIUM 40 MG PO CPDR: 40.0000 mg | DELAYED_RELEASE_CAPSULE | Freq: Every day | ORAL | Status: DC

## 2015-12-11 MED ORDER — TIOTROPIUM BROMIDE MONOHYDRATE 18 MCG IN CAPS: 18.0000 ug | ORAL_CAPSULE | Freq: Once | RESPIRATORY_TRACT | Status: DC

## 2015-12-11 NOTE — Progress Notes (Signed)
  Patient: Sheila [name] Female    DOB: 12/06/1960   55 y.o.   MRN: [id number] Visit Date: 12/11/2015  Today's Provider: [facility]   Chief Complaint  Patient presents with  . Follow-up    Recently discharged from hospital for septic UTI.  Marland Kitchen. Hypertension   Subjective:    Hypertension This is a chronic problem. The current episode started more than 1 year ago. The problem has been gradually worsening since onset. The problem is uncontrolled. Associated symptoms include anxiety, blurred vision, chest pain, headaches, malaise/fatigue, neck pain, orthopnea, palpitations, peripheral edema, PND, shortness of breath and sweats. There are no associated agents to hypertension. Risk factors for coronary artery disease include obesity, smoking/tobacco exposure, stress and family history. Past treatments include diuretics and ACE inhibitors. Compliance problems include medication cost and psychosocial issues.    Patient has a hx of GERD.  Requested Nexium.   Patient has a hx of COPD. Requested Spiriva and Albuterol.     Allergies  Allergen Reactions  . Vicodin [Hydrocodone-Acetaminophen] Nausea And Vomiting  . Hct [Hydrochlorothiazide] Palpitations  . Tramadol Palpitations   Previous Medications   CIPROFLOXACIN (CIPRO) 500 MG TABLET    Take 1 tablet (500 mg total) by mouth 2 (two) times daily.   IBUPROFEN (ADVIL,MOTRIN) 200 MG TABLET    Take 800 mg by mouth every 6 (six) hours as needed for headache or mild pain.    Review of Systems  Constitutional: Positive for malaise/fatigue.  Eyes: Positive for blurred vision.  Respiratory: Positive for shortness of breath.   Cardiovascular: Positive for chest pain, palpitations, orthopnea and PND.  Musculoskeletal: Positive for neck pain.  Neurological: Positive for headaches.    Social History  Substance Use Topics  . Smoking status: Current Every Day Smoker -- 1.00 packs/day    Types: Cigarettes  . Smokeless tobacco: Not on file  .  Alcohol Use: No   Objective:   BP 146/84 mmHg  Pulse 105  Ht 5\' 2"  (1.575 m)  Wt 167 lb (75.751 kg)  BMI 30.54 kg/m2  SpO2 98%  Physical Exam  Constitutional: She is oriented to person, place, and time. She appears well-developed and well-nourished.  HENT:  Head: Normocephalic and atraumatic.  Eyes: Conjunctivae and EOM are normal. Pupils are equal, round, and reactive to light.  Cardiovascular: Normal rate, regular rhythm and normal heart sounds.   Pulmonary/Chest: Effort normal and breath sounds normal.  Abdominal: Soft. Bowel sounds are normal.  Neurological: She is alert and oriented to person, place, and time. She has normal reflexes.  Skin: Skin is warm and dry.  Psychiatric: She has a normal mood and affect. Her behavior is normal. Judgment and thought content normal.        Assessment & Plan:     1. Pyelonephritis:   Complete Cipro.  Recheck UA in one month.  2. HTN:  Start lisinopril 20 mg daily.  Recheck BP in one month.  3. COPD:  Start Spiriva and albuterol.  4. GERD: Start Nexium 40 mg daily.        [facility]   [facility]

## 2015-12-25 ENCOUNTER — Telehealth: Payer: Self-pay | Admitting: Urology

## 2015-12-25 NOTE — Telephone Encounter (Signed)
Called to cancel 6/18 apt. Sheila SagoSarah cancelled apt and left message regarding that, but pt will need recalled to reschedule.

## 2016-01-01 ENCOUNTER — Ambulatory Visit: Payer: Self-pay

## 2016-01-06 ENCOUNTER — Ambulatory Visit: Payer: Self-pay | Admitting: Family Medicine

## 2016-01-06 VITALS — BP 139/92 | HR 78 | Resp 14 | Wt 164.5 lb

## 2016-01-06 DIAGNOSIS — E559 Vitamin D deficiency, unspecified: Secondary | ICD-10-CM | POA: Insufficient documentation

## 2016-01-06 DIAGNOSIS — E279 Disorder of adrenal gland, unspecified: Secondary | ICD-10-CM

## 2016-01-06 DIAGNOSIS — H5213 Myopia, bilateral: Secondary | ICD-10-CM | POA: Insufficient documentation

## 2016-01-06 DIAGNOSIS — R1030 Lower abdominal pain, unspecified: Secondary | ICD-10-CM | POA: Insufficient documentation

## 2016-01-06 DIAGNOSIS — I700 Atherosclerosis of aorta: Secondary | ICD-10-CM | POA: Insufficient documentation

## 2016-01-06 DIAGNOSIS — I10 Essential (primary) hypertension: Secondary | ICD-10-CM

## 2016-01-06 DIAGNOSIS — J441 Chronic obstructive pulmonary disease with (acute) exacerbation: Secondary | ICD-10-CM | POA: Insufficient documentation

## 2016-01-06 DIAGNOSIS — E278 Other specified disorders of adrenal gland: Secondary | ICD-10-CM | POA: Insufficient documentation

## 2016-01-06 DIAGNOSIS — R109 Unspecified abdominal pain: Secondary | ICD-10-CM | POA: Insufficient documentation

## 2016-01-06 DIAGNOSIS — I708 Atherosclerosis of other arteries: Secondary | ICD-10-CM

## 2016-01-06 DIAGNOSIS — Z720 Tobacco use: Secondary | ICD-10-CM | POA: Insufficient documentation

## 2016-01-06 DIAGNOSIS — J42 Unspecified chronic bronchitis: Secondary | ICD-10-CM

## 2016-01-06 DIAGNOSIS — J449 Chronic obstructive pulmonary disease, unspecified: Secondary | ICD-10-CM | POA: Insufficient documentation

## 2016-01-06 HISTORY — DX: Tobacco use: Z72.0

## 2016-01-06 HISTORY — DX: Vitamin D deficiency, unspecified: E55.9

## 2016-01-06 HISTORY — DX: Atherosclerosis of other arteries: I70.8

## 2016-01-06 MED ORDER — VITAMIN D (ERGOCALCIFEROL) 1.25 MG (50000 UNIT) PO CAPS: 50000.0000 [IU] | ORAL_CAPSULE | ORAL | Status: AC

## 2016-01-06 NOTE — Patient Instructions (Addendum)
I do encourage you to quit smoking Call [phone] to sign up for smoking cessation classes You can call 1-800-QUIT-NOW to talk with a smoking cessation coach  Let's get labs tonight We'll have you see the vascular surgeon  Your goal blood pressure is less than 140 mmHg on top. Try to follow the DASH guidelines (DASH stands for Dietary Approaches to Stop Hypertension) Try to limit the sodium in your diet.  Ideally, consume less than 1.5 grams (less than 1,500mg ) per day. Do not add salt when cooking or at the table.  Check the sodium amount on labels when shopping, and choose items lower in sodium when given a choice. Avoid or limit foods that already contain a lot of sodium. Eat a diet rich in fruits and vegetables and whole grains.  Steps to Quit Smoking  Smoking tobacco can be harmful to your health and can affect almost every organ in your body. Smoking puts you, and those around you, at risk for developing many serious chronic diseases. Quitting smoking is difficult, but it is one of the best things that you can do for your health. It is never too late to quit. WHAT ARE THE BENEFITS OF QUITTING SMOKING? When you quit smoking, you lower your risk of developing serious diseases and conditions, such as:  Lung cancer or lung disease, such as COPD.  Heart disease.  Stroke.  Heart attack.  Infertility.  Osteoporosis and bone fractures. Additionally, symptoms such as coughing, wheezing, and shortness of breath may get better when you quit. You may also find that you get sick less often because your body is stronger at fighting off colds and infections. If you are pregnant, quitting smoking can help to reduce your chances of having a baby of low birth weight. HOW DO I GET READY TO QUIT? When you decide to quit smoking, create a plan to make sure that you are successful. Before you quit:  Pick a date to quit. Set a date within the next two weeks to give you time to prepare.  Write  down the reasons why you are quitting. Keep this list in places where you will see it often, such as on your bathroom mirror or in your car or wallet.  Identify the people, places, things, and activities that make you want to smoke (triggers) and avoid them. Make sure to take these actions:  Throw away all cigarettes at home, at work, and in your car.  Throw away smoking accessories, such as Set designer.  Clean your car and make sure to empty the ashtray.  Clean your home, including curtains and carpets.  Tell your family, friends, and coworkers that you are quitting. Support from your loved ones can make quitting easier.  Talk with your health care provider about your options for quitting smoking.  Find out what treatment options are covered by your health insurance. WHAT STRATEGIES CAN I USE TO QUIT SMOKING?  Talk with your healthcare provider about different strategies to quit smoking. Some strategies include:  Quitting smoking altogether instead of gradually lessening how much you smoke over a period of time. Research shows that quitting "cold [place]" is more successful than gradually quitting.  Attending in-person counseling to help you build problem-solving skills. You are more likely to have success in quitting if you attend several counseling sessions. Even short sessions of 10 minutes can be effective.  Finding resources and support systems that can help you to quit smoking and remain smoke-free after you quit. These  resources are most helpful when you use them often. They can include:  Online chats with a Veterinary surgeoncounselor.  Telephone quitlines.  Printed Materials engineerself-help materials.  Support groups or group counseling.  Text messaging programs.  Mobile phone applications.  Taking medicines to help you quit smoking. (If you are pregnant or breastfeeding, talk with your health care provider first.) Some medicines contain nicotine and some do not. Both types of medicines help  with cravings, but the medicines that include nicotine help to relieve withdrawal symptoms. Your health care provider may recommend:  Nicotine patches, gum, or lozenges.  Nicotine inhalers or sprays.  Non-nicotine medicine that is taken by mouth. Talk with your health care provider about combining strategies, such as taking medicines while you are also receiving in-person counseling. Using these two strategies together makes you more likely to succeed in quitting than if you used either strategy on its own. If you are pregnant or breastfeeding, talk with your health care provider about finding counseling or other support strategies to quit smoking. Do not take medicine to help you quit smoking unless told to do so by your health care provider. WHAT THINGS CAN I DO TO MAKE IT EASIER TO QUIT? Quitting smoking might feel overwhelming at first, but there is a lot that you can do to make it easier. Take these important actions:  Reach out to your family and friends and ask that they support and encourage you during this time. Call telephone quitlines, reach out to support groups, or work with a counselor for support.  Ask people who smoke to avoid smoking around you.  Avoid places that trigger you to smoke, such as bars, parties, or smoke-break areas at work.  Spend time around people who do not smoke.  Lessen stress in your life, because stress can be a smoking trigger for some people. To lessen stress, try:  Exercising regularly.  Deep-breathing exercises.  Yoga.  Meditating.  Performing a body scan. This involves closing your eyes, scanning your body from head to toe, and noticing which parts of your body are particularly tense. Purposefully relax the muscles in those areas.  Download or purchase mobile phone or tablet apps (applications) that can help you stick to your quit plan by providing reminders, tips, and encouragement. There are many free apps, such as QuitGuide from the [employer]  Systems developer(Centers for Disease Control and Prevention). You can find other support for quitting smoking (smoking cessation) through smokefree.gov and other websites. HOW WILL I FEEL WHEN I QUIT SMOKING? Within the first 24 hours of quitting smoking, you may start to feel some withdrawal symptoms. These symptoms are usually most noticeable 2-3 days after quitting, but they usually do not last beyond 2-3 weeks. Changes or symptoms that you might experience include:  Mood swings.  Restlessness, anxiety, or irritation.  Difficulty concentrating.  Dizziness.  Strong cravings for sugary foods in addition to nicotine.  Mild weight gain.  Constipation.  Nausea.  Coughing or a sore throat.  Changes in how your medicines work in your body.  A depressed mood.  Difficulty sleeping (insomnia). After the first 2-3 weeks of quitting, you may start to notice more positive results, such as:  Improved sense of smell and taste.  Decreased coughing and sore throat.  Slower heart rate.  Lower blood pressure.  Clearer skin.  The ability to breathe more easily.  Fewer sick days. Quitting smoking is very challenging for most people. Do not get discouraged if you are not successful the first time.  Some people need to make many attempts to quit before they achieve long-term success. Do your best to stick to your quit plan, and talk with your health care provider if you have any questions or concerns.   This information is not intended to replace advice given to you by your health care provider. Make sure you discuss any questions you have with your health care provider.   Document Released: 07/20/2001 Document Revised: 12/10/2014 Document Reviewed: 12/10/2014 Elsevier Interactive Patient Education 2016 [employer]. Smoking Cessation, Tips for Success If you are ready to quit smoking, congratulations! You have chosen to help yourself be healthier. Cigarettes bring nicotine, tar, carbon monoxide, and  other irritants into your body. Your lungs, heart, and blood vessels will be able to work better without these poisons. There are many different ways to quit smoking. Nicotine gum, nicotine patches, a nicotine inhaler, or nicotine nasal spray can help with physical craving. Hypnosis, support groups, and medicines help break the habit of smoking. WHAT THINGS CAN I DO TO MAKE QUITTING EASIER?  Here are some tips to help you quit for good:  Pick a date when you will quit smoking completely. Tell all of your friends and family about your plan to quit on that date.  Do not try to slowly cut down on the number of cigarettes you are smoking. Pick a quit date and quit smoking completely starting on that day.  Throw away all cigarettes.   Clean and remove all ashtrays from your home, work, and car.  On a card, write down your reasons for quitting. Carry the card with you and read it when you get the urge to smoke.  Cleanse your body of nicotine. Drink enough water and fluids to keep your urine clear or pale yellow. Do this after quitting to flush the nicotine from your body.  Learn to predict your moods. Do not let a bad situation be your excuse to have a cigarette. Some situations in your life might tempt you into wanting a cigarette.  Never have "just one" cigarette. It leads to wanting another and another. Remind yourself of your decision to quit.  Change habits associated with smoking. If you smoked while driving or when feeling stressed, try other activities to replace smoking. Stand up when drinking your coffee. Brush your teeth after eating. Sit in a different chair when you read the paper. Avoid alcohol while trying to quit, and try to drink fewer caffeinated beverages. Alcohol and caffeine may urge you to smoke.  Avoid foods and drinks that can trigger a desire to smoke, such as sugary or spicy foods and alcohol.  Ask people who smoke not to smoke around you.  Have something planned to do  right after eating or having a cup of coffee. For example, plan to take a walk or exercise.  Try a relaxation exercise to calm you down and decrease your stress. Remember, you may be tense and nervous for the first 2 weeks after you quit, but this will pass.  Find new activities to keep your hands busy. Play with a pen, coin, or rubber band. Doodle or draw things on paper.  Brush your teeth right after eating. This will help cut down on the craving for the taste of tobacco after meals. You can also try mouthwash.   Use oral substitutes in place of cigarettes. Try using lemon drops, carrots, cinnamon sticks, or chewing gum. Keep them handy so they are available when you have the urge to smoke.  When you have the urge to smoke, try deep breathing.  Designate your home as a nonsmoking area.  If you are a heavy smoker, ask your health care provider about a prescription for nicotine chewing gum. It can ease your withdrawal from nicotine.  Reward yourself. Set aside the cigarette money you save and buy yourself something nice.  Look for support from others. Join a support group or smoking cessation program. Ask someone at home or at work to help you with your plan to quit smoking.  Always ask yourself, "Do I need this cigarette or is this just a reflex?" Tell yourself, "Today, I choose not to smoke," or "I do not want to smoke." You are reminding yourself of your decision to quit.  Do not replace cigarette smoking with electronic cigarettes (commonly called e-cigarettes). The safety of e-cigarettes is unknown, and some may contain harmful chemicals.  If you relapse, do not give up! Plan ahead and think about what you will do the next time you get the urge to smoke. HOW WILL I FEEL WHEN I QUIT SMOKING? You may have symptoms of withdrawal because your body is used to nicotine (the addictive substance in cigarettes). You may crave cigarettes, be irritable, feel very hungry, cough often, get  headaches, or have difficulty concentrating. The withdrawal symptoms are only temporary. They are strongest when you first quit but will go away within 10-14 days. When withdrawal symptoms occur, stay in control. Think about your reasons for quitting. Remind yourself that these are signs that your body is healing and getting used to being without cigarettes. Remember that withdrawal symptoms are easier to treat than the major diseases that smoking can cause.  Even after the withdrawal is over, expect periodic urges to smoke. However, these cravings are generally short lived and will go away whether you smoke or not. Do not smoke! WHAT RESOURCES ARE AVAILABLE TO HELP ME QUIT SMOKING? Your health care provider can direct you to community resources or hospitals for support, which may include:  Group support.  Education.  Hypnosis.  Therapy.   This information is not intended to replace advice given to you by your health care provider. Make sure you discuss any questions you have with your health care provider.   Document Released: 04/23/2004 Document Revised: 08/16/2014 Document Reviewed: 01/11/2013 Elsevier Interactive Patient Education [employer]

## 2016-01-06 NOTE — Assessment & Plan Note (Signed)
Noted in hospital labs; reviewed with patient; vit D low, but I would be surprised if her Ca2+ is that high just from mild vitamin D deficiency; last PTH was normal, but I'm going to check this again; also, check phosphorus and albumin

## 2016-01-06 NOTE — Assessment & Plan Note (Addendum)
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

## 2016-01-06 NOTE — Assessment & Plan Note (Signed)
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

## 2016-01-06 NOTE — Assessment & Plan Note (Signed)
Patient requests referral to get eye glasses; entered

## 2016-01-06 NOTE — Assessment & Plan Note (Addendum)
Dash guidelines encouraged; smoking cessation encouraged; continue lisinopril

## 2016-01-06 NOTE — Assessment & Plan Note (Signed)
Left side, following round of pyelo; check urine tonight

## 2016-01-06 NOTE — Assessment & Plan Note (Signed)
With mottled legs, suggestive of PVD; refer to vascular surgeon; stressed importance of smoking cessation

## 2016-01-06 NOTE — Assessment & Plan Note (Signed)
Stressed importance of smoking cessation; urged her to think about quitting; see AVS

## 2016-01-06 NOTE — Assessment & Plan Note (Signed)
Start vit D 50k iu weekly short-term, then switch to 1000 iu daily OTC vitamin D3

## 2016-01-06 NOTE — Progress Notes (Signed)
BP 139/92 mmHg  Pulse 78  Resp 14  Wt 164 lb 8 oz (74.617 kg)  LMP 08/09/2008 (Within Years)   Subjective:    Patient ID: Sheila [name], female    DOB: 1961/06/01, 55 y.o.   MRN: [id number]  HPI: Sheila [name] is a 55 y.o. female  Chief Complaint  Patient presents with  . Abdominal Pain    pain in lower abdomen persisting since last month's admission for kidney infxn   She was hospitalized last month with a kidney infection; here at [facility]; hospitalized for 3 days Was seen by Michiel Cowboy on Dec 11, 2015 for hospital follow-up Still having symptoms, some pain along the left side mostly, sometimes right side No fevers; sometimes has nausea with eating right away; vomited a few times No blood in urine; doesn't smell strong Normal BMs Still has ovaries LMP was 8-9 years ago  Reviewed urine culture; > 100k E coli April 26th; finished out the antibiotics CT scan showed pyelo bilaterally, worse on the left Atherosclerosis in aorta and iliac arteries; discussed with patient  Chart reviewed and vit D was low and calcium was high; normal PTH in April  Relevant past medical, surgical, family and social history reviewed Past Medical History  Diagnosis Date  . Asthma   . Hypertension   . Tobacco abuse 01/06/2016  . Aorto-iliac atherosclerosis (HCC) 01/06/2016  . Vitamin D deficiency 01/06/2016   Past Surgical History  Procedure Laterality Date  . Left ankle surgery    . Left ankle hardware removal     Family History  Problem Relation Age of Onset  . COPD Mother   . Heart failure Mother   . COPD Father   . Heart failure Father    Social History  Substance Use Topics  . Smoking status: Current Every Day Smoker -- 0.50 packs/day    Types: Cigarettes  . Smokeless tobacco: None  . Alcohol Use: No  she tried quitting in the hospital; she is down from 1 ppd, to maybe 7-8 cigs a day Interim medical history since last visit reviewed. Allergies and medications  reviewed  Review of Systems Per HPI unless specifically indicated above     Objective:    BP 139/92 mmHg  Pulse 78  Resp 14  Wt 164 lb 8 oz (74.617 kg)  LMP 08/09/2008 (Within Years)  Wt Readings from Last 3 Encounters:  01/06/16 164 lb 8 oz (74.617 kg)  12/11/15 167 lb (75.751 kg)  12/03/15 170 lb (77.111 kg)    Physical Exam  Constitutional: She appears well-developed and well-nourished. No distress.  Appears older than stated age  Cardiovascular: Normal rate and regular rhythm.   Barely palpable right D pedis; I could not palpate left D pedis  Pulmonary/Chest: Effort normal and breath sounds normal.  Abdominal: Soft. Bowel sounds are normal. There is no tenderness.  Skin:  Skin mottled on the legs and feet  Psychiatric: She has a normal mood and affect.   Results for orders placed or performed during the hospital encounter of 12/03/15  Blood culture (routine x 2)  Result Value Ref Range   Specimen Description BLOOD LEFT HAND    Special Requests      BOTTLES DRAWN AEROBIC AND ANAEROBIC  AERO 15CC ANA 10CC   Culture  Setup Time      GRAM NEGATIVE RODS ANAEROBIC BOTTLE ONLY CRITICAL RESULT CALLED TO, READ BACK BY AND VERIFIED WITH: KAREN HAYES 12/04/15 1408 MLM    Culture ESCHERICHIA  COLI ANAEROBIC BOTTLE ONLY  (A)    Report Status 12/08/2015 FINAL    Organism ID, Bacteria ESCHERICHIA COLI       Susceptibility   Escherichia coli - MIC*    AMPICILLIN <=2 SENSITIVE Sensitive     CEFAZOLIN <=4 SENSITIVE Sensitive     CEFEPIME <=1 SENSITIVE Sensitive     CEFTAZIDIME <=1 SENSITIVE Sensitive     CEFTRIAXONE <=1 SENSITIVE Sensitive     CIPROFLOXACIN <=0.25 SENSITIVE Sensitive     GENTAMICIN <=1 SENSITIVE Sensitive     IMIPENEM <=0.25 SENSITIVE Sensitive     TRIMETH/SULFA <=20 SENSITIVE Sensitive     AMPICILLIN/SULBACTAM <=2 SENSITIVE Sensitive     PIP/TAZO <=4 SENSITIVE Sensitive     Extended ESBL NEGATIVE Sensitive     * ESCHERICHIA COLI  Blood culture (routine x  2)  Result Value Ref Range   Specimen Description BLOOD RIGHT ASSIST CONTROL    Special Requests BOTTLES DRAWN AEROBIC AND ANAEROBIC  5CC    Culture NO GROWTH 7 DAYS    Report Status 12/10/2015 FINAL   Urine culture  Result Value Ref Range   Specimen Description URINE, CLEAN CATCH    Special Requests Normal    Culture >=100,000 COLONIES/mL ESCHERICHIA COLI (A)    Report Status 12/08/2015 FINAL    Organism ID, Bacteria ESCHERICHIA COLI (A)       Susceptibility   Escherichia coli - MIC*    AMPICILLIN <=2 SENSITIVE Sensitive     CEFAZOLIN <=4 SENSITIVE Sensitive     CEFTRIAXONE <=1 SENSITIVE Sensitive     CIPROFLOXACIN <=0.25 SENSITIVE Sensitive     GENTAMICIN <=1 SENSITIVE Sensitive     IMIPENEM <=0.25 SENSITIVE Sensitive     NITROFURANTOIN <=16 SENSITIVE Sensitive     TRIMETH/SULFA <=20 SENSITIVE Sensitive     AMPICILLIN/SULBACTAM <=2 SENSITIVE Sensitive     PIP/TAZO <=4 SENSITIVE Sensitive     Extended ESBL NEGATIVE Sensitive     * >=100,000 COLONIES/mL ESCHERICHIA COLI  Blood Culture ID Panel (Reflexed)  Result Value Ref Range   Enterococcus species NOT DETECTED NOT DETECTED   Vancomycin resistance NOT DETECTED NOT DETECTED   Listeria monocytogenes NOT DETECTED NOT DETECTED   Staphylococcus species NOT DETECTED NOT DETECTED   Staphylococcus aureus NOT DETECTED NOT DETECTED   Methicillin resistance NOT DETECTED NOT DETECTED   Streptococcus species NOT DETECTED NOT DETECTED   Streptococcus agalactiae NOT DETECTED NOT DETECTED   Streptococcus pneumoniae NOT DETECTED NOT DETECTED   Streptococcus pyogenes NOT DETECTED NOT DETECTED   Acinetobacter baumannii NOT DETECTED NOT DETECTED   Enterobacteriaceae species DETECTED (A) NOT DETECTED   Enterobacter cloacae complex NOT DETECTED NOT DETECTED   Escherichia coli DETECTED (A) NOT DETECTED   Klebsiella oxytoca NOT DETECTED NOT DETECTED   Klebsiella pneumoniae NOT DETECTED NOT DETECTED   Proteus species NOT DETECTED NOT DETECTED    Serratia marcescens NOT DETECTED NOT DETECTED   Carbapenem resistance NOT DETECTED NOT DETECTED   Haemophilus influenzae NOT DETECTED NOT DETECTED   Neisseria meningitidis NOT DETECTED NOT DETECTED   Pseudomonas aeruginosa NOT DETECTED NOT DETECTED   Candida albicans NOT DETECTED NOT DETECTED   Candida glabrata NOT DETECTED NOT DETECTED   Candida krusei NOT DETECTED NOT DETECTED   Candida parapsilosis NOT DETECTED NOT DETECTED   Candida tropicalis NOT DETECTED NOT DETECTED  Urinalysis complete, with microscopic- may I&O cath if menses [facility] only)  Result Value Ref Range   Color, Urine YELLOW (A) YELLOW   APPearance HAZY (A) CLEAR  Glucose, UA NEGATIVE NEGATIVE mg/dL   Bilirubin Urine NEGATIVE NEGATIVE   Ketones, ur NEGATIVE NEGATIVE mg/dL   Specific Gravity, Urine 1.002 (L) 1.005 - 1.030   Hgb urine dipstick 2+ (A) NEGATIVE   pH 7.0 5.0 - 8.0   Protein, ur NEGATIVE NEGATIVE mg/dL   Nitrite POSITIVE (A) NEGATIVE   Leukocytes, UA 3+ (A) NEGATIVE   RBC / HPF 0-5 0 - 5 RBC/hpf   WBC, UA 6-30 0 - 5 WBC/hpf   Bacteria, UA MANY (A) NONE SEEN   Squamous Epithelial / LPF 0-5 (A) NONE SEEN   WBC Clumps PRESENT    Mucous PRESENT   CBC with Differential/Platelet  Result Value Ref Range   WBC 24.3 (H) 3.6 - 11.0 K/uL   RBC 4.69 3.80 - 5.20 MIL/uL   Hemoglobin 14.2 12.0 - 16.0 g/dL   HCT 16.1 09.6 - 04.5 %   MCV 90.6 80.0 - 100.0 fL   MCH 30.2 26.0 - 34.0 pg   MCHC 33.3 32.0 - 36.0 g/dL   RDW 40.9 81.1 - 91.4 %   Platelets 296 150 - 440 K/uL   Neutrophils Relative % 90 %   Neutro Abs 21.7 (H) 1.4 - 6.5 K/uL   Lymphocytes Relative 7 %   Lymphs Abs 1.8 1.0 - 3.6 K/uL   Monocytes Relative 2 %   Monocytes Absolute 0.6 0.2 - 0.9 K/uL   Eosinophils Relative 0 %   Eosinophils Absolute 0.0 0 - 0.7 K/uL   Basophils Relative 1 %   Basophils Absolute 0.1 0 - 0.1 K/uL  Comprehensive metabolic panel  Result Value Ref Range   Sodium 139 135 - 145 mmol/L   Potassium 3.5 3.5 - 5.1 mmol/L    Chloride 105 101 - 111 mmol/L   CO2 24 22 - 32 mmol/L   Glucose, Bld 122 (H) 65 - 99 mg/dL   BUN 11 6 - 20 mg/dL   Creatinine, Ser 7.82 0.44 - 1.00 mg/dL   Calcium 95.6 (H) 8.9 - 10.3 mg/dL   Total Protein 7.7 6.5 - 8.1 g/dL   Albumin 3.9 3.5 - 5.0 g/dL   AST 27 15 - 41 U/L   ALT 26 14 - 54 U/L   Alkaline Phosphatase 87 38 - 126 U/L   Total Bilirubin 0.6 0.3 - 1.2 mg/dL   GFR calc non Af Amer >60 >60 mL/min   GFR calc Af Amer >60 >60 mL/min   Anion gap 10 5 - 15  Basic metabolic panel  Result Value Ref Range   Sodium 138 135 - 145 mmol/L   Potassium 3.2 (L) 3.5 - 5.1 mmol/L   Chloride 108 101 - 111 mmol/L   CO2 23 22 - 32 mmol/L   Glucose, Bld 113 (H) 65 - 99 mg/dL   BUN 10 6 - 20 mg/dL   Creatinine, Ser 2.13 0.44 - 1.00 mg/dL   Calcium 9.9 8.9 - 08.6 mg/dL   GFR calc non Af Amer >60 >60 mL/min   GFR calc Af Amer >60 >60 mL/min   Anion gap 7 5 - 15  CBC  Result Value Ref Range   WBC 21.8 (H) 3.6 - 11.0 K/uL   RBC 3.65 (L) 3.80 - 5.20 MIL/uL   Hemoglobin 11.1 (L) 12.0 - 16.0 g/dL   HCT 57.8 (L) 46.9 - 62.9 %   MCV 90.9 80.0 - 100.0 fL   MCH 30.4 26.0 - 34.0 pg   MCHC 33.5 32.0 - 36.0 g/dL   RDW  13.4 11.5 - 14.5 %   Platelets 241 150 - 440 K/uL  PTH, intact and calcium  Result Value Ref Range   PTH 58 15 - 65 pg/mL   Calcium, Total (PTH) 10.3 (H) 8.7 - 10.2 mg/dL   PTH Comment   TSH  Result Value Ref Range   TSH 3.908 0.350 - 4.500 uIU/mL  VITAMIN D 25 Hydroxy (Vit-D Deficiency, Fractures)  Result Value Ref Range   Vit D, 25-Hydroxy 19.0 (L) 30.0 - 100.0 ng/mL  Brain natriuretic peptide  Result Value Ref Range   B Natriuretic Peptide 328.0 (H) 0.0 - 100.0 pg/mL  CBC  Result Value Ref Range   WBC 12.7 (H) 3.6 - 11.0 K/uL   RBC 4.15 3.80 - 5.20 MIL/uL   Hemoglobin 12.6 12.0 - 16.0 g/dL   HCT 16.1 09.6 - 04.5 %   MCV 91.9 80.0 - 100.0 fL   MCH 30.3 26.0 - 34.0 pg   MCHC 32.9 32.0 - 36.0 g/dL   RDW 40.9 81.1 - 91.4 %   Platelets 302 150 - 440 K/uL  Basic  metabolic panel  Result Value Ref Range   Sodium 143 135 - 145 mmol/L   Potassium 3.6 3.5 - 5.1 mmol/L   Chloride 111 101 - 111 mmol/L   CO2 23 22 - 32 mmol/L   Glucose, Bld 107 (H) 65 - 99 mg/dL   BUN 16 6 - 20 mg/dL   Creatinine, Ser 7.82 0.44 - 1.00 mg/dL   Calcium 95.6 (H) 8.9 - 10.3 mg/dL   GFR calc non Af Amer >60 >60 mL/min   GFR calc Af Amer >60 >60 mL/min   Anion gap 9 5 - 15      Assessment & Plan:   Problem List Items Addressed This Visit      Cardiovascular and Mediastinum   Aorto-iliac atherosclerosis (HCC)    With mottled legs, suggestive of PVD; refer to vascular surgeon; stressed importance of smoking cessation      Relevant Orders   Ambulatory referral to Vascular Surgery   Essential hypertension    Dash guidelines encouraged; smoking cessation encouraged; continue lisinopril        Respiratory   COPD (chronic obstructive pulmonary disease) (HCC)    Stressed importance of smoking cessation; urged her to think about quitting; see AVS        Endocrine   Adrenal nodule (HCC)    discusesd with patient; we had too much to cover today, so will have her come back next week or week after; while this is likely an incidentaloma, she had some lab abnormalities        Other   Hypercalcemia    Noted in hospital labs; reviewed with patient; vit D low, but I would be surprised if her Ca2+ is that high just from mild vitamin D deficiency; last PTH was normal, but I'm going to check this again; also, check phosphorus and albumin      Relevant Orders   Comprehensive metabolic panel   Parathyroid hormone, intact (no Ca)   Phosphorus   Comprehensive metabolic panel   Phosphorus   Lower abdominal pain - Primary    Left side, following round of pyelo; check urine tonight      Relevant Orders   UA/M w/rflx Culture, Routine   Comprehensive metabolic panel   UA/M w/rflx Culture, Routine   Myopia of both eyes    Patient requests referral to get eye glasses;  entered  Relevant Orders   Ambulatory referral to Optometry   Tobacco abuse    Urged patient to quit; cited the atherosclerosis in her aorta and iliacs, mottled circulation; see AVS; f/u in 1-2 weeks      Vitamin D deficiency    Start vit D 50k iu weekly short-term, then switch to 1000 iu daily OTC vitamin D3         Follow up plan: Return in about 2 weeks (around 01/20/2016) for follow-up on urine, calcium, vit D, smoking, adrenal nodule, BP.  An after-visit summary was printed and given to the patient at check-out.  Please see the patient instructions which may contain other information and recommendations beyond what is mentioned above in the assessment and plan.  Meds ordered this encounter  Medications  . Vitamin D, Ergocalciferol, (DRISDOL) 50000 units CAPS capsule    Sig: Take 1 capsule (50,000 Units total) by mouth every 7 (seven) days.    Dispense:  4 capsule    Refill:  0   Orders Placed This Encounter  Procedures  . UA/M w/rflx Culture, Routine  . Comprehensive metabolic panel  . Parathyroid hormone, intact (no Ca)  . Phosphorus  . Comprehensive metabolic panel  . Phosphorus  . UA/M w/rflx Culture, Routine  . Ambulatory referral to Vascular Surgery  . Ambulatory referral to Optometry

## 2016-01-07 LAB — MICROSCOPIC EXAMINATION: Casts: NONE SEEN /lpf

## 2016-01-07 LAB — COMPREHENSIVE METABOLIC PANEL
A/G RATIO: 1.5 (ref 1.2–2.2)
ALBUMIN: 4 g/dL (ref 3.5–5.5)
ALT: 27 IU/L (ref 0–32)
AST: 22 IU/L (ref 0–40)
Alkaline Phosphatase: 84 IU/L (ref 39–117)
BUN / CREAT RATIO: 13 (ref 9–23)
BUN: 10 mg/dL (ref 6–24)
Bilirubin Total: <0.2 mg/dL (ref 0.0–1.2)
CALCIUM: 12.1 mg/dL — AB (ref 8.7–10.2)
CO2: 23 mmol/L (ref 18–29)
Chloride: 104 mmol/L (ref 96–106)
Creatinine, Ser: 0.75 mg/dL (ref 0.57–1.00)
GFR calc non Af Amer: 90 mL/min/{1.73_m2} (ref >59)
GFR, EST AFRICAN AMERICAN: 104 mL/min/{1.73_m2} (ref >59)
GLUCOSE: 86 mg/dL (ref 65–99)
Globulin, Total: 2.6 g/dL (ref 1.5–4.5)
Potassium: 3.9 mmol/L (ref 3.5–5.2)
Sodium: 142 mmol/L (ref 134–144)
TOTAL PROTEIN: 6.6 g/dL (ref 6.0–8.5)

## 2016-01-07 LAB — UA/M W/RFLX CULTURE, ROUTINE
Bilirubin, UA: NEGATIVE
GLUCOSE, UA: NEGATIVE
Ketones, UA: NEGATIVE
Leukocytes, UA: NEGATIVE
NITRITE UA: NEGATIVE
PH UA: 7 (ref 5.0–7.5)
Protein, UA: NEGATIVE
SPEC GRAV UA: 1.007 (ref 1.005–1.030)
UUROB: 0.2 mg/dL (ref 0.2–1.0)

## 2016-01-07 LAB — PHOSPHORUS: Phosphorus: 2.7 mg/dL (ref 2.5–4.5)

## 2016-01-07 LAB — PARATHYROID HORMONE, INTACT (NO CA): PTH: 24 pg/mL (ref 15–65)

## 2016-01-15 ENCOUNTER — Other Ambulatory Visit: Payer: Self-pay

## 2016-01-20 ENCOUNTER — Ambulatory Visit: Payer: Self-pay

## 2016-01-22 ENCOUNTER — Ambulatory Visit: Payer: Self-pay | Admitting: Urology

## 2016-01-22 DIAGNOSIS — N119 Chronic tubulo-interstitial nephritis, unspecified: Secondary | ICD-10-CM

## 2016-01-22 DIAGNOSIS — J189 Pneumonia, unspecified organism: Secondary | ICD-10-CM

## 2016-01-22 MED ORDER — CIPROFLOXACIN HCL 500 MG PO TABS: 500.0000 mg | ORAL_TABLET | Freq: Two times a day (BID) | ORAL | Status: DC

## 2016-01-22 NOTE — Progress Notes (Signed)
LMP 08/09/2008 (Within Years)   Subjective:    Patient ID: Sheila [name], female    DOB: [birth year], 55 y.o.   MRN: [id number]  HPI: Sheila [name] is a 55 y.o. female  Chief Complaint  Patient presents with  . Results    lab work  . Sinus Problem    getting worse   She was hospitalized last month with a kidney infection; here at [facility]; hospitalized for 3 days Was seen by Michiel Cowboy on Dec 11, 2015 for hospital follow-up Still having symptoms, some pain along the left side mostly, sometimes right side No fevers; sometimes has nausea with eating right away No blood in urine; doesn't smell strong Normal BMs Still has ovaries LMP was 8-9 years ago  Reviewed urine culture; > 100k E coli April 26th; finished out the antibiotics CT scan showed pyelo bilaterally, worse on the left Atherosclerosis in aorta and iliac arteries; discussed with patient  Chart reviewed and vit D was low and calcium was high; normal PTH in April  Relevant past medical, surgical, family and social history reviewed Past Medical History  Diagnosis Date  . Asthma   . Hypertension   . Tobacco abuse 01/06/2016  . Aorto-iliac atherosclerosis (HCC) 01/06/2016  . Vitamin D deficiency 01/06/2016   Past Surgical History  Procedure Laterality Date  . Left ankle surgery    . Left ankle hardware removal     Family History  Problem Relation Age of Onset  . COPD Mother   . Heart failure Mother   . COPD Father   . Heart failure Father    Social History  Substance Use Topics  . Smoking status: Current Every Day Smoker -- 0.50 packs/day    Types: Cigarettes  . Smokeless tobacco: Not on file  . Alcohol Use: No  she tried quitting in the hospital; she is down from 1 ppd, to maybe 7-8 cigs a day Interim medical history since last visit reviewed. Allergies and medications reviewed  Review of Systems Per HPI unless specifically indicated above     Objective:    LMP 08/09/2008 (Within Years)  Wt  Readings from Last 3 Encounters:  01/06/16 164 lb 8 oz (74.617 kg)  12/11/15 167 lb (75.751 kg)  12/03/15 170 lb (77.111 kg)    Physical Exam  Constitutional: She appears well-developed and well-nourished. No distress.  Appears older than stated age  Cardiovascular: Normal rate and regular rhythm.   Barely palpable right D pedis; I could not palpate left D pedis  Pulmonary/Chest: Effort normal. She has decreased breath sounds in the right middle field and the right lower field.  Abdominal: Soft. Bowel sounds are normal. There is no tenderness.  Skin:  Skin mottled on the legs and feet  Psychiatric: She has a normal mood and affect.   Results for orders placed or performed in visit on 01/06/16  Microscopic Examination  Result Value Ref Range   WBC, UA 0-5 0 -  5 /hpf   RBC, UA 0-2 0 -  2 /hpf   Epithelial Cells (non renal) 0-10 0 - 10 /hpf   Casts None seen None seen /lpf   Mucus, UA Present Not Estab.   Bacteria, UA Few None seen/Few  Parathyroid hormone, intact (no Ca)  Result Value Ref Range   PTH 24 15 - 65 pg/mL  Comprehensive metabolic panel  Result Value Ref Range   Glucose 86 65 - 99 mg/dL   BUN 10 6 - 24 mg/dL  Creatinine, Ser 0.75 0.57 - 1.00 mg/dL   GFR calc non Af Amer 90 >59 mL/min/1.73   GFR calc Af Amer 104 >59 mL/min/1.73   BUN/Creatinine Ratio 13 9 - 23   Sodium 142 134 - 144 mmol/L   Potassium 3.9 3.5 - 5.2 mmol/L   Chloride 104 96 - 106 mmol/L   CO2 23 18 - 29 mmol/L   Calcium 12.1 (H) 8.7 - 10.2 mg/dL   Total Protein 6.6 6.0 - 8.5 g/dL   Albumin 4.0 3.5 - 5.5 g/dL   Globulin, Total 2.6 1.5 - 4.5 g/dL   Albumin/Globulin Ratio 1.5 1.2 - 2.2   Bilirubin Total <0.2 0.0 - 1.2 mg/dL   Alkaline Phosphatase 84 39 - 117 IU/L   AST 22 0 - 40 IU/L   ALT 27 0 - 32 IU/L  Phosphorus  Result Value Ref Range   Phosphorus 2.7 2.5 - 4.5 mg/dL  UA/M w/rflx Culture, Routine  Result Value Ref Range   Specific Gravity, UA 1.007 1.005 - 1.030   pH, UA 7.0 5.0 - 7.5    Color, UA Yellow Yellow   Appearance Ur Clear Clear   Leukocytes, UA Negative Negative   Protein, UA Negative Negative/Trace   Glucose, UA Negative Negative   Ketones, UA Negative Negative   RBC, UA Trace (A) Negative   Bilirubin, UA Negative Negative   Urobilinogen, Ur 0.2 0.2 - 1.0 mg/dL   Nitrite, UA Negative Negative   Microscopic Examination See below:    Urinalysis Reflex Comment       Assessment & Plan:   Problem List Items Addressed This Visit    None     Pyelonephritis- still with left CVA tenderness   - obtain CT abd/pelvis w/o  Pneumonia- start Cipro         -obtain chest x-ray

## 2016-01-28 ENCOUNTER — Encounter: Payer: Self-pay | Admitting: Vascular Surgery

## 2016-02-02 ENCOUNTER — Emergency Department
Admission: EM | Admit: 2016-02-02 | Discharge: 2016-02-02 | Disposition: A | Discharge status: Home / Self Care | Payer: Self-pay | Attending: Student | Admitting: Student

## 2016-02-02 ENCOUNTER — Emergency Department: Payer: Self-pay

## 2016-02-02 DIAGNOSIS — I10 Essential (primary) hypertension: Secondary | ICD-10-CM | POA: Insufficient documentation

## 2016-02-02 DIAGNOSIS — Y9389 Activity, other specified: Secondary | ICD-10-CM | POA: Insufficient documentation

## 2016-02-02 DIAGNOSIS — F17210 Nicotine dependence, cigarettes, uncomplicated: Secondary | ICD-10-CM | POA: Insufficient documentation

## 2016-02-02 DIAGNOSIS — J449 Chronic obstructive pulmonary disease, unspecified: Secondary | ICD-10-CM | POA: Insufficient documentation

## 2016-02-02 DIAGNOSIS — J45909 Unspecified asthma, uncomplicated: Secondary | ICD-10-CM | POA: Insufficient documentation

## 2016-02-02 DIAGNOSIS — S9002XA Contusion of left ankle, initial encounter: Secondary | ICD-10-CM | POA: Insufficient documentation

## 2016-02-02 DIAGNOSIS — W208XXA Other cause of strike by thrown, projected or falling object, initial encounter: Secondary | ICD-10-CM | POA: Insufficient documentation

## 2016-02-02 DIAGNOSIS — Y929 Unspecified place or not applicable: Secondary | ICD-10-CM | POA: Insufficient documentation

## 2016-02-02 DIAGNOSIS — Y999 Unspecified external cause status: Secondary | ICD-10-CM | POA: Insufficient documentation

## 2016-02-02 MED ORDER — OXYCODONE-ACETAMINOPHEN 5-325 MG PO TABS: 1.0000 | ORAL_TABLET | Freq: Four times a day (QID) | ORAL | Status: DC | PRN

## 2016-02-02 MED ORDER — MELOXICAM 15 MG PO TABS: 15.0000 mg | ORAL_TABLET | Freq: Every day | ORAL | Status: DC

## 2016-02-02 NOTE — ED Provider Notes (Signed)
[facility] Emergency Department Provider Note  ____________________________________________  Time seen: Approximately 8:55 PM  I have reviewed the triage vital signs and the nursing notes.   HISTORY  Chief Complaint Foot Injury    HPI Sheila [name] is a 55 y.o. female who presents emergency department complaining of left foot pain. Patient states that she was lifting a heavy box off the back of a truck when she accidentally dropped on her foot. Patient is reporting severe pain, swelling, and edema to the foot. She is unable to bear weight at this time. Patient denies any numbness or tingling in her toes. She denies any other injuries. She is not having medications for this complaint prior to arrival. Patient states that the pain is sharp, constant, worse with movement or weightbearing, described as severe.   Past Medical History  Diagnosis Date  . Asthma   . Hypertension   . Tobacco abuse 01/06/2016  . Aorto-iliac atherosclerosis (HCC) 01/06/2016  . Vitamin D deficiency 01/06/2016    Patient Active Problem List   Diagnosis Date Noted  . COPD (chronic obstructive pulmonary disease) (HCC) 01/06/2016  . Adrenal nodule (HCC) 01/06/2016  . Tobacco abuse 01/06/2016  . Aorto-iliac atherosclerosis (HCC) 01/06/2016  . Vitamin D deficiency 01/06/2016  . Lower abdominal pain 01/06/2016  . Myopia of both eyes 01/06/2016  . Hypercalcemia 01/06/2016  . Essential hypertension 12/11/2015  . Pyelonephritis 12/03/2015    Past Surgical History  Procedure Laterality Date  . Left ankle surgery    . Left ankle hardware removal      Current Outpatient Rx  Name  Route  Sig  Dispense  Refill  . ciprofloxacin (CIPRO) 500 MG tablet   Oral   Take 1 tablet (500 mg total) by mouth 2 (two) times daily.   20 tablet   0   . esomeprazole (NEXIUM) 40 MG capsule   Oral   Take 1 capsule (40 mg total) by mouth daily at 12 noon.   90 capsule   3   . ibuprofen  (ADVIL,MOTRIN) 200 MG tablet   Oral   Take 800 mg by mouth every 6 (six) hours as needed for headache or mild pain. Reported on 01/06/2016         . lisinopril (PRINIVIL,ZESTRIL) 20 MG tablet   Oral   Take 1 tablet (20 mg total) by mouth daily.   90 tablet   3   . meloxicam (MOBIC) 15 MG tablet   Oral   Take 1 tablet (15 mg total) by mouth daily.   30 tablet   0   . oxyCODONE-acetaminophen (ROXICET) 5-325 MG tablet   Oral   Take 1 tablet by mouth every 6 (six) hours as needed for severe pain.   20 tablet   0   . tiotropium (SPIRIVA) 18 MCG inhalation capsule   Inhalation   Place 1 capsule (18 mcg total) into inhaler and inhale once.   90 capsule   3   . Vitamin D, Ergocalciferol, (DRISDOL) 50000 units CAPS capsule   Oral   Take 1 capsule (50,000 Units total) by mouth every 7 (seven) days.   4 capsule   0     Allergies Vicodin; Hct; and Tramadol  Family History  Problem Relation Age of Onset  . COPD Mother   . Heart failure Mother   . COPD Father   . Heart failure Father     Social History Social History  Substance Use Topics  . Smoking status:  Current Every Day Smoker -- 0.50 packs/day    Types: Cigarettes  . Smokeless tobacco: Not on file  . Alcohol Use: No     Review of Systems  Constitutional: No fever/chills Cardiovascular: no chest pain. Respiratory: no cough. No SOB. Musculoskeletal: Positive for left foot pain Skin: Negative for rash, abrasions, lacerations, ecchymosis. Neurological: Negative for headaches, focal weakness or numbness. 10-point ROS otherwise negative.  ____________________________________________   PHYSICAL EXAM:  VITAL SIGNS: ED Triage Vitals  Enc Vitals Group     BP 02/02/16 1959 189/101 mmHg     Pulse Rate 02/02/16 1959 91     Resp 02/02/16 1959 18     Temp 02/02/16 1959 97.9 F (36.6 C)     Temp Source 02/02/16 1959 Oral     SpO2 02/02/16 1959 100 %     Weight 02/02/16 1959 162 lb (73.483 kg)     Height  02/02/16 1959 5\' 2"  (1.575 m)     Head Cir --      Peak Flow --      Pain Score 02/02/16 2000 10     Pain Loc --      Pain Edu? --      Excl. in GC? --      Constitutional: Alert and oriented. Well appearing and in no acute distress. Eyes: Conjunctivae are normal. PERRL. EOMI. Head: Atraumatic. Cardiovascular: Normal rate, regular rhythm. Normal S1 and S2.  Good peripheral circulation. Respiratory: Normal respiratory effort without tachypnea or retractions. Lungs CTAB. Good air entry to the bases with no decreased or absent breath sounds. Musculoskeletal: Patient has limited range of motion in all fields of the left ankle due to pain and swelling. Patient has significant ecchymosis and edema noted to the forefoot with extension into the distal tibia and fibula. Mild abrasion is noted to the anterior aspect of the ankle joint. Patient is diffusely tender to palpation on the anterior aspect of the ankle from the extreme distal end of the tibia and fibula through the metatarsal bones. No palpable abnormality. Patient reports significant tenderness with passive range of motion. Dorsalis pedis pulse intact. Sensation intact 5 digits. Neurologic:  Normal speech and language. No gross focal neurologic deficits are appreciated.  Skin:  Skin is warm, dry and intact. No rash noted. Psychiatric: Mood and affect are normal. Speech and behavior are normal. Patient exhibits appropriate insight and judgement.   ____________________________________________   LABS (all labs ordered are listed, but only abnormal results are displayed)  Labs Reviewed - No data to display ____________________________________________  EKG   ____________________________________________  RADIOLOGY Festus BarrenI, Kallum Jorgensen D Josalynn Johndrow, personally viewed and evaluated these images (plain radiographs) as part of my medical decision making, as well as reviewing the written report by the radiologist.  Dg Ankle Complete  Left  02/02/2016  CLINICAL DATA:  Dropped box on foot, pain in foot and ankle. EXAM: LEFT ANKLE COMPLETE - 3+ VIEW COMPARISON:  05/18/2013 FINDINGS: Chronic spurring along the inferior margins of the medial and lateral malleoli. There is spurring along the anterior distal tibial rim. Mortise intact. Chronic cortical irregularity posteriorly along the distal fibula, not changed from 05/18/2013. IMPRESSION: 1. Chronic cortical irregularity posteriorly along the distal fibular shaft, no change over the last 2 years, compatible with deformity from the patient's remote fracture from 2011. 2. Mild spurring of the malleoli. Electronically Signed   By: Gaylyn RongWalter  Liebkemann M.D.   On: 02/02/2016 20:30   Dg Foot Complete Left  02/02/2016  CLINICAL DATA:  Dropped  box on left foot, pain in foot and ankle. Swelling and bruising. EXAM: LEFT FOOT - COMPLETE 3+ VIEW COMPARISON:  05/18/2013 FINDINGS: No malalignment at the Lisfranc joint. Phalanges intact. No metatarsal fracture identified. There is soft tissue swelling along the dorsum of the foot especially overlying the midfoot and metatarsals. IMPRESSION: 1. Soft tissue swelling along the dorsum of the foot without underlying fracture or foreign body identified. Electronically Signed   By: Gaylyn Rong M.D.   On: 02/02/2016 20:27    ____________________________________________    PROCEDURES  Procedure(s) performed:       Medications - No data to display   ____________________________________________   INITIAL IMPRESSION / ASSESSMENT AND PLAN / ED COURSE  Pertinent labs & imaging results that were available during my care of the patient were reviewed by me and considered in my medical decision making (see chart for details).  Patient's diagnosis is consistent with left foot and ankle contusion. X-ray reveals no acute osseous abnormalities. Exam is reassuring. Patient is given an Ace bandage here in the emergency department as well as crutches for  ambulation. Patient will be discharged home with prescriptions for anti-inflammatories and limited pain medication. Patient is to follow up with primary care provider as needed or otherwise directed. Patient is given ED precautions to return to the ED for any worsening or new symptoms.     ____________________________________________  FINAL CLINICAL IMPRESSION(S) / ED DIAGNOSES  Final diagnoses:  Ankle contusion, left, initial encounter      NEW MEDICATIONS STARTED DURING THIS VISIT:  New Prescriptions   MELOXICAM (MOBIC) 15 MG TABLET    Take 1 tablet (15 mg total) by mouth daily.   OXYCODONE-ACETAMINOPHEN (ROXICET) 5-325 MG TABLET    Take 1 tablet by mouth every 6 (six) hours as needed for severe pain.        This chart was dictated using voice recognition software/Dragon. Despite best efforts to proofread, errors can occur which can change the meaning. Any change was purely unintentional.    Racheal Patches, PA-C 02/02/16 [id number]  Gayla Doss, MD 02/02/16 [phone]

## 2016-02-02 NOTE — ED Notes (Addendum)
Pt presents to ED with c/o LEFT foot pain after dropping a box she was lifting on it. Pt reports injury occurred between 6-7 pm tonight. Swelling and bruising noted to LEFT ankle and foot. Pt reports increased pain with ambulation.

## 2016-02-02 NOTE — ED Notes (Signed)
Pt dropped box on left foot having pain to foot and ankle.  Positive swelling and bruising noted to area.

## 2016-02-02 NOTE — Discharge Instructions (Signed)
Periosteal Hematoma Periosteal hematoma (bone bruise) is a localized, tender, raised area close to the bone. It can occur from a small hidden fracture of the bone, following surgery, or from other trauma to the area. It typically occurs in bones located close to the surface of the skin, such as the shin, knee, and heel bone. Although it may take 2 or more weeks to completely heal, bone bruises typically are not associated with permanent or serious damage to the bone. If you are taking blood thinners, you may be at greater risk for such injuries.  CAUSES  A bone bruise is usually caused by high-impact trauma to the bone, but it can be caused by sports injuries or twisting injuries. SIGNS AND SYMPTOMS   Severe pain around the injured area that typically lasts longer than a normal bruise.  Difficulty using the bruised area.  Tender, raised area close to the bone.  Discoloration or swelling of the bruised area. DIAGNOSIS  You may need an MRI of the injured area to confirm a bone bruise if your health care provider feels it is necessary. A regular X-ray will not detect a bone bruise, but it will detect a broken bone (fracture). An X-ray may be taken to rule out any fractures. TREATMENT  Often, the best treatment for a bone bruise is resting, icing, and applying cold compresses to the injured area. Over-the-counter medicines may also be recommended for pain control. HOME CARE INSTRUCTIONS  Some things you can do to improve the condition are:   Rest and elevate the area of injury as long as it is very tender or swollen.  Apply ice to the injured area:  Put ice in a plastic bag.  Place a towel between your skin and the bag.  Leave the ice on for 20 minutes, 2-3 times a day.  Use an elastic wrap to reduce swelling and protect the injured area. Make sure it is not applied too tightly. If the area around the wrap becomes cold or blue, the wrap is too tight. Wrap it more loosely.  For  activity:  Follow your health care provider's instructions about whether walking with crutches is required. This will depend on how serious your condition is.  Start weight bearing gradually on the bruised part.  Continue to use crutches or a cane until you can stand without causing pain, or as instructed.  If a plaster splint was applied:  Wear the splint until you are seen for a follow-up exam.  Rest it on nothing harder than a pillow the first 24 hours.  Do not put weight on it.  Do not get it wet. You may take it off to take a shower or bath.  You may have been given an elastic bandage to use with or without the plaster splint. The splint is too tight if you have numbness or tingling, or if the skin around the bandage becomes cold and blue. Adjust the bandage to make it comfortable.  If an air splint was applied:  You may alter the amount of air in the splint as needed for comfort.  You may take it off at night and to take a shower or bath.  If the injury was in either leg, wiggle your toes in the splint several times per day if you are able.  Only take over-the-counter or prescription medicines for pain, discomfort, or fever as directed by your health care provider.  Keep all follow-up visits with your health care provider. This includes   any orthopedic referrals, physical therapy, and rehabilitation. Any delay in getting necessary care could result in a delay or failure of the bones to heal. SEEK MEDICAL CARE IF:   You have an increase in bruising, swelling, tenderness, heat, or pain over your injury.  You notice coldness of your toes that does not improve after removing a splint or bandage.  Your pain is not lessened after you take medicine.  You have increased difficulty bearing weight on the injured leg, if the injury is in either leg. SEEK IMMEDIATE MEDICAL CARE IF:   You have severe pain near the injured area or severe pain with stretching.  You have increased  swelling that resulted in a tense, hard area or a loss of sensation in the area of the injury.  You have pale, cool skin below the area of the injury (in an extremity) that does not go away after removing a splint or bandage. MAKE SURE YOU:   Understand these instructions.  Will watch your condition.  Will get help right away if you are not doing well or get worse.   This information is not intended to replace advice given to you by your health care provider. Make sure you discuss any questions you have with your health care provider.   Document Released: 09/02/2004 Document Revised: 05/16/2013 Document Reviewed: 01/12/2013 Elsevier Interactive Patient Education 2016 [employer]  

## 2016-02-03 ENCOUNTER — Ambulatory Visit (INDEPENDENT_AMBULATORY_CARE_PROVIDER_SITE_OTHER): Payer: Self-pay | Admitting: Vascular Surgery

## 2016-02-03 ENCOUNTER — Encounter: Payer: Self-pay | Admitting: Vascular Surgery

## 2016-02-03 VITALS — BP 142/90 | HR 75 | Temp 97.3°F | Resp 16 | Ht 62.0 in | Wt 163.0 lb

## 2016-02-03 DIAGNOSIS — I70299 Other atherosclerosis of native arteries of extremities, unspecified extremity: Secondary | ICD-10-CM

## 2016-02-03 DIAGNOSIS — I700 Atherosclerosis of aorta: Secondary | ICD-10-CM

## 2016-02-03 DIAGNOSIS — I708 Atherosclerosis of other arteries: Principal | ICD-10-CM

## 2016-02-03 NOTE — Progress Notes (Signed)
[facility]  Patient name: Sheila [name] MRN: [id number] DOB: 1961-05-07 Sex: female  REASON FOR VISIT: claudication  HPI: Sheila [name] is a 55 y.o. female, who presents for evaluation of bilateral lower extremity claudication. The patient has a history of pyelonephritis and underwent CT abdomen in May 2017. This showed aortoiliac disease. The patient complains mainly of left foot pain. This pain is at rest and with walking. She has a prior history of left ankle surgery years ago. This was complicated by an infection resulting in removal of hardware. She did drop something heavy on her foot last night.  The patient ambulates with a cane due to generalized weakness. She cannot walk very far secondary to shortness of breath. Approximately 1-2 times a week, she complains of pain in her feet that wakes her up at night. She occasionally has pain in her hips and calves both at rest and with walking. She denies a history of nonhealing wounds. She complains of chronic numbness and burning of her feet bilaterally. She does have chronic low back pain. She does report bilateral leg swelling. The patient is not diabetic. She has smoked 1 pack per day for the past 40 years.  The patient has no known history of CAD or CVA. She does report chest pain when her blood pressure is high. Her hypertension is not well controlled. She does not have hyperlipidemia. He does not know whether she has been tested for this or not. Her care is managed at the [facility] in [place].  Past Medical History  Diagnosis Date  . Asthma   . Hypertension   . Tobacco abuse 01/06/2016  . Aorto-iliac atherosclerosis (HCC) 01/06/2016  . Vitamin D deficiency 01/06/2016    Family History  Problem Relation Age of Onset  . COPD Mother   . Heart failure Mother   . COPD Father   . Heart failure Father     SOCIAL HISTORY: Social History   Social History  . Marital Status: Divorced   Spouse Name: N/A  . Number of Children: N/A  . Years of Education: N/A   Occupational History  . Not on file.   Social History Main Topics  . Smoking status: Current Every Day Smoker -- 1.00 packs/day    Types: Cigarettes  . Smokeless tobacco: Not on file  . Alcohol Use: No  . Drug Use: No  . Sexual Activity: Not on file   Other Topics Concern  . Not on file   Social History Narrative    Allergies  Allergen Reactions  . Vicodin [Hydrocodone-Acetaminophen] Nausea And Vomiting  . Hct [Hydrochlorothiazide] Palpitations  . Tramadol Palpitations    Current Outpatient Prescriptions  Medication Sig Dispense Refill  . esomeprazole (NEXIUM) 40 MG capsule Take 1 capsule (40 mg total) by mouth daily at 12 noon. 90 capsule 3  . ibuprofen (ADVIL,MOTRIN) 200 MG tablet Take 800 mg by mouth every 6 (six) hours as needed for headache or mild pain. Reported on 01/06/2016    . lisinopril (PRINIVIL,ZESTRIL) 20 MG tablet Take 1 tablet (20 mg total) by mouth daily. 90 tablet 3  . meloxicam (MOBIC) 15 MG tablet Take 1 tablet (15 mg total) by mouth daily. 30 tablet 0  . oxyCODONE-acetaminophen (ROXICET) 5-325 MG tablet Take 1 tablet by mouth every 6 (six) hours as needed for severe pain. 20 tablet 0  . tiotropium (SPIRIVA) 18 MCG inhalation capsule Place 1 capsule (18 mcg total) into inhaler and inhale  once. 90 capsule 3  . Vitamin D, Ergocalciferol, (DRISDOL) 50000 units CAPS capsule Take 1 capsule (50,000 Units total) by mouth every 7 (seven) days. 4 capsule 0   No current facility-administered medications for this visit.    REVIEW OF SYSTEMS:  [X]  denotes positive finding, [ ]  denotes negative finding Cardiac  Comments:  Chest pain or chest pressure: x   Shortness of breath upon exertion: x   Short of breath when lying flat:    Irregular heart rhythm:        Vascular    Pain in calf, thigh, or hip brought on by ambulation: x   Pain in feet at night that wakes you up from your sleep:  x     Blood clot in your veins:    Leg swelling:  x       Pulmonary    Oxygen at home:    Productive cough:  x   Wheezing:         Neurologic    Sudden weakness in arms or legs:  x   Sudden numbness in arms or legs:  x   Sudden onset of difficulty speaking or slurred speech:    Temporary loss of vision in one eye:     Problems with dizziness:         Gastrointestinal    Blood in stool:     Vomited blood:         Genitourinary    Burning when urinating:     Blood in urine:        Psychiatric    Major depression:         Hematologic    Bleeding problems:    Problems with blood clotting too easily:        Skin    Rashes or ulcers:        Constitutional    Fever or chills: x     PHYSICAL EXAM: Filed Vitals:   02/03/16 1408  BP: 142/90  Pulse: 75  Temp: 97.3 F (36.3 C)  TempSrc: Oral  Resp: 16  Height: 5\' 2"  (1.575 m)  Weight: 163 lb (73.936 kg)  SpO2: 96%    GENERAL: The patient is a well-nourished female, in no acute distress. The vital signs are documented above. CARDIAC: There is a regular rate and rhythm. No carotid bruits. VASCULAR: 2+ radial pulses. Nonpalpable popliteal pulses. 2+ dorsalis pedis pulses bilaterally. Feet are warm and pink bilaterally. PULMONARY: Nonlabored respiratory effort. Decreased breath sounds at bases. ABDOMEN: Soft and non-tender with normal pitched bowel sounds.  MUSCULOSKELETAL: Left foot is swollen with superficial abrasion on the dorsum of foot. NEUROLOGIC: No focal weakness or paresthesias are detected. PSYCHIATRIC: The patient has a normal affect.  DATA:  CT abdomen and pelvis reviewed from 12/03/2015. There is extensive nonocclusive atherosclerosis of the abdominal aorta and iliac arteries bilaterally.  MEDICAL ISSUES: Aorto-iliac atherosclerotic disease  The patient has extensive atherosclerotic disease of her aorta and iliacs bilaterally. She has normal circulation to her feet with palpable pedal pulses bilaterally. No  further intervention or workup. The patient's ambulation is limited by shortness of breath. She does not ambulate enough to elicit claudication. She has baseline numbness of her feet. The patient is not currently at risk for limb loss. However, she was counseled extensively on smoking cessation. Discussed that with continued smoking, she is at high risk for cardiac and other vascular issues.  She does not need a CT angiogram to further evaluate her aorto  iliac disease.  She will follow up on an as-needed basis.  Maris BergerKimberly Trinh, PA-C  [facility] [place] [phone]    I have examined the patient, reviewed and agree with above. Discussed with the patient and her family present. Explained critical importance of smoking cessation. Explained that this is the primary cause of her severe aorta iliac calcification. Fortunately she has not had any decreased arterial flow related to her severe calcification. Explained that this is a marker that puts her at increased risk for stroke and for coronary disease. She does have a 2+ dorsalis pedis pulses bilaterally and no claudication symptoms. She will see us again as needed  Gretta BeganEarly, Yanni Ruberg, MD 02/03/2016 3:37 PM

## 2016-02-05 ENCOUNTER — Ambulatory Visit: Payer: Self-pay | Admitting: Urology

## 2016-02-05 VITALS — BP 170/100 | HR 85 | Wt 165.5 lb

## 2016-02-05 DIAGNOSIS — N119 Chronic tubulo-interstitial nephritis, unspecified: Secondary | ICD-10-CM

## 2016-02-05 NOTE — Progress Notes (Signed)
BP 170/100 mmHg  Pulse 85  Wt 165 lb 8 oz (75.07 kg)  SpO2 100%  LMP 08/09/2008 (Within Years)   Subjective:    Patient ID: Sheila [name], female    DOB: 06/26/1961, 55 y.o.   MRN: [id number]  HPI: Sheila [name] is a 55 y.o. female   She was hospitalized last month with a kidney infection; here at [facility]; hospitalized for 3 days Was seen by Michiel CowboyShannon Flynt Breeze on Dec 11, 2015 for hospital follow-up Still having symptoms, some pain along the left side mostly, sometimes right side No fevers; sometimes has nausea with eating right away No blood in urine; doesn't smell strong Normal BMs Still has ovaries LMP was 8-9 years ago She is experiencing SUI and left lower quadrant pain  Reviewed urine culture; > 100k E coli April 26th; finished out the antibiotics CT scan showed pyelo bilaterally, worse on the left Atherosclerosis in aorta and iliac arteries; discussed with patient  Chart reviewed and vit D was low and calcium was high; normal PTH in April  Relevant past medical, surgical, family and social history reviewed Past Medical History  Diagnosis Date  . Asthma   . Hypertension   . Tobacco abuse 01/06/2016  . Aorto-iliac atherosclerosis (HCC) 01/06/2016  . Vitamin D deficiency 01/06/2016   Past Surgical History  Procedure Laterality Date  . Left ankle surgery    . Left ankle hardware removal     Family History  Problem Relation Age of Onset  . COPD Mother   . Heart failure Mother   . COPD Father   . Heart failure Father    Social History  Substance Use Topics  . Smoking status: Current Every Day Smoker -- 1.00 packs/day    Types: Cigarettes  . Smokeless tobacco: None  . Alcohol Use: No  she tried quitting in the hospital; she is down from 1 ppd, to maybe 7-8 cigs a day Interim medical history since last visit reviewed. Allergies and medications reviewed  Review of Systems Per HPI unless specifically indicated above     Objective:    BP 170/100 mmHg  Pulse  85  Wt 165 lb 8 oz (75.07 kg)  SpO2 100%  LMP 08/09/2008 (Within Years)  Wt Readings from Last 3 Encounters:  02/05/16 165 lb 8 oz (75.07 kg)  02/03/16 163 lb (73.936 kg)  02/02/16 162 lb (73.483 kg)    Physical Exam  Constitutional: She appears well-developed and well-nourished. No distress.  Appears older than stated age  Cardiovascular: Normal rate and regular rhythm.   Barely palpable right D pedis; I could not palpate left D pedis  Pulmonary/Chest: Effort normal. She has decreased breath sounds in the right middle field and the right lower field.  Abdominal: Soft. Bowel sounds are normal. There is no tenderness.  Skin:  Skin mottled on the legs and feet  Psychiatric: She has a normal mood and affect.   Results for orders placed or performed in visit on 01/06/16  Microscopic Examination  Result Value Ref Range   WBC, UA 0-5 0 -  5 /hpf   RBC, UA 0-2 0 -  2 /hpf   Epithelial Cells (non renal) 0-10 0 - 10 /hpf   Casts None seen None seen /lpf   Mucus, UA Present Not Estab.   Bacteria, UA Few None seen/Few  Parathyroid hormone, intact (no Ca)  Result Value Ref Range   PTH 24 15 - 65 pg/mL  Comprehensive metabolic panel  Result Value  Ref Range   Glucose 86 65 - 99 mg/dL   BUN 10 6 - 24 mg/dL   Creatinine, Ser 1.610.75 0.57 - 1.00 mg/dL   GFR calc non Af Amer 90 >59 mL/min/1.73   GFR calc Af Amer 104 >59 mL/min/1.73   BUN/Creatinine Ratio 13 9 - 23   Sodium 142 134 - 144 mmol/L   Potassium 3.9 3.5 - 5.2 mmol/L   Chloride 104 96 - 106 mmol/L   CO2 23 18 - 29 mmol/L   Calcium 12.1 (H) 8.7 - 10.2 mg/dL   Total Protein 6.6 6.0 - 8.5 g/dL   Albumin 4.0 3.5 - 5.5 g/dL   Globulin, Total 2.6 1.5 - 4.5 g/dL   Albumin/Globulin Ratio 1.5 1.2 - 2.2   Bilirubin Total <0.2 0.0 - 1.2 mg/dL   Alkaline Phosphatase 84 39 - 117 IU/L   AST 22 0 - 40 IU/L   ALT 27 0 - 32 IU/L  Phosphorus  Result Value Ref Range   Phosphorus 2.7 2.5 - 4.5 mg/dL  UA/M w/rflx Culture, Routine  Result  Value Ref Range   Specific Gravity, UA 1.007 1.005 - 1.030   pH, UA 7.0 5.0 - 7.5   Color, UA Yellow Yellow   Appearance Ur Clear Clear   Leukocytes, UA Negative Negative   Protein, UA Negative Negative/Trace   Glucose, UA Negative Negative   Ketones, UA Negative Negative   RBC, UA Trace (A) Negative   Bilirubin, UA Negative Negative   Urobilinogen, Ur 0.2 0.2 - 1.0 mg/dL   Nitrite, UA Negative Negative   Microscopic Examination See below:    Urinalysis Reflex Comment       Assessment & Plan:   Problem List Items Addressed This Visit    None     Pyelonephritis- still with left CVA tenderness   - obtain CT abd/pelvis w/o; pending 07/03  Pneumonia- start Cipro         -obtain chest x-ray  SUI  See Michiel CowboyShannon Azlan Hanway, [facility]

## 2016-02-09 ENCOUNTER — Ambulatory Visit
Admission: RE | Admit: 2016-02-09 | Discharge: 2016-02-09 | Disposition: A | Discharge status: Home / Self Care | Payer: Self-pay | Source: Ambulatory Visit | Attending: Urology | Admitting: Urology

## 2016-02-09 DIAGNOSIS — N119 Chronic tubulo-interstitial nephritis, unspecified: Secondary | ICD-10-CM | POA: Insufficient documentation

## 2016-02-09 DIAGNOSIS — K5730 Diverticulosis of large intestine without perforation or abscess without bleeding: Secondary | ICD-10-CM | POA: Insufficient documentation

## 2016-02-09 DIAGNOSIS — I700 Atherosclerosis of aorta: Secondary | ICD-10-CM | POA: Insufficient documentation

## 2016-02-18 ENCOUNTER — Ambulatory Visit: Payer: Self-pay | Admitting: Ophthalmology

## 2016-03-02 ENCOUNTER — Ambulatory Visit (INDEPENDENT_AMBULATORY_CARE_PROVIDER_SITE_OTHER): Payer: Self-pay | Admitting: Urology

## 2016-03-02 ENCOUNTER — Encounter: Payer: Self-pay | Admitting: Urology

## 2016-03-02 VITALS — BP 173/109 | HR 69 | Ht 62.0 in | Wt 163.0 lb

## 2016-03-02 DIAGNOSIS — N12 Tubulo-interstitial nephritis, not specified as acute or chronic: Secondary | ICD-10-CM

## 2016-03-02 DIAGNOSIS — N390 Urinary tract infection, site not specified: Secondary | ICD-10-CM | POA: Insufficient documentation

## 2016-03-02 LAB — URINALYSIS, COMPLETE
Bilirubin, UA: NEGATIVE
Glucose, UA: NEGATIVE
KETONES UA: NEGATIVE
LEUKOCYTES UA: NEGATIVE
NITRITE UA: NEGATIVE
PH UA: 6.5 (ref 5.0–7.5)
Protein, UA: NEGATIVE
SPEC GRAV UA: 1.01 (ref 1.005–1.030)
Urobilinogen, Ur: 0.2 mg/dL (ref 0.2–1.0)

## 2016-03-02 LAB — MICROSCOPIC EXAMINATION
BACTERIA UA: NONE SEEN
WBC, UA: NONE SEEN /hpf (ref 0–?)

## 2016-03-02 MED ORDER — POLYETHYLENE GLYCOL 3350 17 GM/SCOOP PO POWD: 17.0000 g | Freq: Every day | ORAL | 3 refills | Status: DC

## 2016-03-02 NOTE — Progress Notes (Signed)
03/02/2016 11:59 AM   Gigi Gin Willette Cluster June 14, 1961 [id number]  Referring provider: Zachery Dauer, FNP [address] [place], [place] [address]  Chief Complaint  Patient presents with  . New Patient (Initial Visit)    urinary frequency    HPI: [name] is a 55yo G3P3 seen today for recurrent UTI and pyelonephritis. She was hospitalized in 11/2015 and 12/2015 for pyelonephritis and was treated with cipro. She has chronic left flank pain.  She had vaginal deliveries. She gets 3-5 UTIs per year. She has frequency every 1 hour, urgency, nocturia 3-4x.  She has leakage with coughing/sneezing.  She goes through 3-4 pads which are soaked.  She has urgency with urge incontinence daily.  She does not wear pads at night.  She has issues with constipation and has a BM every 4-5 days.   PVR is 170cc.   PMH: Past Medical History:  Diagnosis Date  . Aorto-iliac atherosclerosis (HCC) 01/06/2016  . Asthma   . Hypertension   . Tobacco abuse 01/06/2016  . Vitamin D deficiency 01/06/2016    Surgical History: Past Surgical History:  Procedure Laterality Date  . left ankle hardware removal    . left ankle surgery      Home Medications:    Medication List       Accurate as of 03/02/16 11:59 AM. Always use your most recent med list.          esomeprazole 40 MG capsule Commonly known as:  NEXIUM Take 1 capsule (40 mg total) by mouth daily at 12 noon.   ibuprofen 200 MG tablet Commonly known as:  ADVIL,MOTRIN Take 800 mg by mouth every 6 (six) hours as needed for headache or mild pain. Reported on 01/06/2016   lisinopril 20 MG tablet Commonly known as:  PRINIVIL,ZESTRIL Take 1 tablet (20 mg total) by mouth daily.   meloxicam 15 MG tablet Commonly known as:  MOBIC Take 1 tablet (15 mg total) by mouth daily.   oxyCODONE-acetaminophen 5-325 MG tablet Commonly known as:  ROXICET Take 1 tablet by mouth every 6 (six) hours as needed for severe pain.   tiotropium 18 MCG  inhalation capsule Commonly known as:  SPIRIVA Place 1 capsule (18 mcg total) into inhaler and inhale once.       Allergies:  Allergies  Allergen Reactions  . Vicodin [Hydrocodone-Acetaminophen] Nausea And Vomiting  . Hct [Hydrochlorothiazide] Palpitations  . Tramadol Palpitations    Family History: Family History  Problem Relation Age of Onset  . COPD Mother   . Heart failure Mother   . COPD Father   . Heart failure Father     Social History:  reports that she has been smoking Cigarettes.  She has been smoking about 1.00 pack per day. She does not have any smokeless tobacco history on file. She reports that she does not drink alcohol or use drugs.  ROS:                                        Physical Exam: BP (!) 173/109 (BP Location: Left Arm, Patient Position: Sitting, Cuff Size: Large)   Pulse 69   Ht  (1.575 m)   Wt 73.9 kg (163 lb)   LMP 08/09/2008 (Within Years)   BMI 29.81 kg/m   Constitutional:  Alert and oriented, No acute distress. HEENT: [place] AT, moist mucus membranes.  Trachea midline, no masses.  Cardiovascular: No clubbing, cyanosis, or edema. Respiratory: Normal respiratory effort, no increased work of breathing. GI: Abdomen is soft, nontender, nondistended, no abdominal masses GU: No CVA tenderness.  Skin: No rashes, bruises or suspicious lesions. Lymph: No cervical or inguinal adenopathy. Neurologic: Grossly intact, no focal deficits, moving all 4 extremities. Psychiatric: Normal mood and affect.  Laboratory Data: Lab Results  Component Value Date   WBC 12.7 (H) 12/06/2015   HGB 12.6 12/06/2015   HCT 38.2 12/06/2015   MCV 91.9 12/06/2015   PLT 302 12/06/2015    Lab Results  Component Value Date   CREATININE 0.75 01/06/2016    No results found for: PSA  No results found for: TESTOSTERONE  No results found for: HGBA1C  Urinalysis    Component Value Date/Time   COLORURINE YELLOW (A) 12/03/2015 1326    APPEARANCEUR Clear 01/06/2016 2014   LABSPEC 1.002 (L) 12/03/2015 1326   LABSPEC 1.002 12/04/2013 1330   PHURINE 7.0 12/03/2015 1326   GLUCOSEU Negative 01/06/2016 2014   GLUCOSEU Negative 12/04/2013 1330   HGBUR 2+ (A) 12/03/2015 1326   BILIRUBINUR Negative 01/06/2016 2014   BILIRUBINUR Negative 12/04/2013 1330   KETONESUR NEGATIVE 12/03/2015 1326   PROTEINUR Negative 01/06/2016 2014   PROTEINUR NEGATIVE 12/03/2015 1326   UROBILINOGEN 0.2 05/08/2010 1804   NITRITE Negative 01/06/2016 2014   NITRITE POSITIVE (A) 12/03/2015 1326   LEUKOCYTESUR Negative 01/06/2016 2014   LEUKOCYTESUR Negative 12/04/2013 1330    Pertinent Imaging: CT abd/pelvis  Assessment & Plan:    1. Recurrent UTI -timed voiding -miralax 17g daily for constipation -office cystoscopy - Urinalysis, Complete - Bladder Scan (Post Void Residual) in office   No Follow-up on file.  Wilkie Aye, MD  [facility] [address] [place], [place] [address] [phone]

## 2016-04-06 ENCOUNTER — Ambulatory Visit (INDEPENDENT_AMBULATORY_CARE_PROVIDER_SITE_OTHER): Payer: Self-pay | Admitting: Urology

## 2016-04-06 VITALS — BP 183/117 | HR 84 | Ht 62.0 in | Wt 162.0 lb

## 2016-04-06 DIAGNOSIS — N390 Urinary tract infection, site not specified: Secondary | ICD-10-CM

## 2016-04-06 LAB — MICROSCOPIC EXAMINATION

## 2016-04-06 LAB — URINALYSIS, COMPLETE
BILIRUBIN UA: NEGATIVE
GLUCOSE, UA: NEGATIVE
KETONES UA: NEGATIVE
Nitrite, UA: NEGATIVE
PROTEIN UA: NEGATIVE
SPEC GRAV UA: 1.01 (ref 1.005–1.030)
Urobilinogen, Ur: 0.2 mg/dL (ref 0.2–1.0)
pH, UA: 6.5 (ref 5.0–7.5)

## 2016-04-06 MED ORDER — LIDOCAINE HCL 2 % EX GEL
1.0000 "application " | Freq: Once | CUTANEOUS | Status: AC
  Administered 2016-04-06: 1 via URETHRAL

## 2016-04-06 MED ORDER — CIPROFLOXACIN HCL 500 MG PO TABS
500.0000 mg | ORAL_TABLET | Freq: Once | ORAL | Status: AC
  Administered 2016-04-06: 500 mg via ORAL

## 2016-04-06 NOTE — Progress Notes (Signed)
04/06/2016 9:45 AM   Sheila [name] Cluster [birth year] [id number]  Referring provider: No referring provider defined for this encounter.  Chief Complaint  Patient presents with  . Cysto    HPI: [name] is a 55yo here for followup for recurrent UTI and OAB and constipation. She has a soft BM every 1-2 days. No UTIs recently. She continues to have urgency and urge incontinence multiple times daily. She has tried timed voiding and fluid management   PMH: Past Medical History:  Diagnosis Date  . Aorto-iliac atherosclerosis (HCC) 01/06/2016  . Asthma   . Hypertension   . Tobacco abuse 01/06/2016  . Vitamin D deficiency 01/06/2016    Surgical History: Past Surgical History:  Procedure Laterality Date  . left ankle hardware removal    . left ankle surgery      Home Medications:    Medication List       Accurate as of 04/06/16  9:45 AM. Always use your most recent med list.          esomeprazole 40 MG capsule Commonly known as:  NEXIUM Take 1 capsule (40 mg total) by mouth daily at 12 noon.   ibuprofen 200 MG tablet Commonly known as:  ADVIL,MOTRIN Take 800 mg by mouth every 6 (six) hours as needed for headache or mild pain. Reported on 01/06/2016   lisinopril 20 MG tablet Commonly known as:  PRINIVIL,ZESTRIL Take 1 tablet (20 mg total) by mouth daily.       Allergies:  Allergies  Allergen Reactions  . Vicodin [Hydrocodone-Acetaminophen] Nausea And Vomiting  . Hct [Hydrochlorothiazide] Palpitations  . Tramadol Palpitations    Family History: Family History  Problem Relation Age of Onset  . COPD Mother   . Heart failure Mother   . COPD Father   . Heart failure Father     Social History:  reports that she has been smoking Cigarettes.  She has been smoking about 1.00 pack per day. She does not have any smokeless tobacco history on file. She reports that she does not drink alcohol or use drugs.  ROS:                                          Physical Exam: BP (!) 183/117   Pulse 84   Ht 5\' 2"  (1.575 m)   Wt 73.5 kg (162 lb)   LMP 08/09/2008 (Within Years)   BMI 29.63 kg/m   Constitutional:  Alert and oriented, No acute distress. HEENT: [place] AT, moist mucus membranes.  Trachea midline, no masses. Cardiovascular: No clubbing, cyanosis, or edema. Respiratory: Normal respiratory effort, no increased work of breathing. GI: Abdomen is soft, nontender, nondistended, no abdominal masses GU: No CVA tenderness.  Skin: No rashes, bruises or suspicious lesions. Lymph: No cervical or inguinal adenopathy. Neurologic: Grossly intact, no focal deficits, moving all 4 extremities. Psychiatric: Normal mood and affect.  Laboratory Data: Lab Results  Component Value Date   WBC 12.7 (H) 12/06/2015   HGB 12.6 12/06/2015   HCT 38.2 12/06/2015   MCV 91.9 12/06/2015   PLT 302 12/06/2015    Lab Results  Component Value Date   CREATININE 0.75 01/06/2016    No results found for: PSA  No results found for: TESTOSTERONE  No results found for: HGBA1C  Urinalysis    Component Value Date/Time   COLORURINE YELLOW (A) 12/03/2015 1326   APPEARANCEUR  Clear 03/02/2016 1140   LABSPEC 1.002 (L) 12/03/2015 1326   LABSPEC 1.002 12/04/2013 1330   PHURINE 7.0 12/03/2015 1326   GLUCOSEU Negative 03/02/2016 1140   GLUCOSEU Negative 12/04/2013 1330   HGBUR 2+ (A) 12/03/2015 1326   BILIRUBINUR Negative 03/02/2016 1140   BILIRUBINUR Negative 12/04/2013 1330   KETONESUR NEGATIVE 12/03/2015 1326   PROTEINUR Negative 03/02/2016 1140   PROTEINUR NEGATIVE 12/03/2015 1326   UROBILINOGEN 0.2 05/08/2010 1804   NITRITE Negative 03/02/2016 1140   NITRITE POSITIVE (A) 12/03/2015 1326   LEUKOCYTESUR Negative 03/02/2016 1140   LEUKOCYTESUR Negative 12/04/2013 1330    Pertinent Imaging: None     Cystoscopy Procedure Note  Patient identification was confirmed, informed consent was obtained, and patient was prepped using Betadine solution.   Lidocaine jelly was administered per urethral meatus.    Preoperative abx where received prior to procedure.    Procedure: - Flexible cystoscope introduced, without any difficulty.   - Thorough search of the bladder revealed:    normal urethral meatus    normal urothelium    no stones    no ulcers     no tumors    no urethral polyps    no trabeculation  - Ureteral orifices were normal in position and appearance.  Post-Procedure: - Patient tolerated the procedure well   Assessment & Plan:    1. Recurrent UTI  - Urinalysis, Complete - ciprofloxacin (CIPRO) tablet 500 mg; Take 1 tablet (500 mg total) by mouth once. - lidocaine (XYLOCAINE) 2 % jelly 1 application; Place 1 application into the urethra once.  2. OAB: -vesicare 5mg  daily RTC 4 -6 weeks  3. Constipation: -continue miralax daily   No Follow-up on file.  Sheila AyePatrick McKenzie, MD  [facility] [address], [address] [place], [place] [address] [phone]

## 2016-04-13 ENCOUNTER — Ambulatory Visit: Payer: Self-pay | Admitting: Urology

## 2016-04-13 VITALS — BP 202/123 | HR 87 | Resp 18 | Ht 62.0 in | Wt 164.5 lb

## 2016-04-13 DIAGNOSIS — I10 Essential (primary) hypertension: Secondary | ICD-10-CM

## 2016-04-13 MED ORDER — AMLODIPINE BESYLATE 5 MG PO TABS: 5.0000 mg | ORAL_TABLET | Freq: Every day | ORAL | 0 refills | Status: DC

## 2016-04-13 NOTE — Progress Notes (Signed)
BP (!) 202/123 (BP Location: Right Arm)   Pulse 87   Resp 18   Ht 5\' 2"  (1.575 m)   Wt 164 lb 8 oz (74.6 kg)   LMP 08/09/2008 (Within Years)   SpO2 96%   BMI 30.09 kg/m    Subjective:    Patient ID: Sheila [name], female    DOB: 12-27-1960, 55 y.o.   MRN: [id number]  HPI: Sheila [name] is a 55 y.o. female   She was hospitalized last month with a kidney infection; here at [facility]; hospitalized for 3 days Was seen by Michiel Cowboy on Dec 11, 2015 for hospital follow-up Still having symptoms, some pain along the left side mostly, sometimes right side No fevers; sometimes has nausea with eating right away No blood in urine; doesn't smell strong Normal BMs Still has ovaries LMP was 8-9 years ago She is experiencing SUI and left lower quadrant pain  Reviewed urine culture; > 100k E coli April 26th; finished out the antibiotics Repeat CT demonstrates that the pyelo has resolved Atherosclerosis in aorta and iliac arteries; discussed with patient  Chart reviewed and vit D was low and calcium was high; normal PTH in April  Rash started on lower legs one week ago after she had develop a painful gland on her right neck  Relevant past medical, surgical, family and social history reviewed Past Medical History:  Diagnosis Date  . Aorto-iliac atherosclerosis (HCC) 01/06/2016  . Asthma   . Hypertension   . Tobacco abuse 01/06/2016  . Vitamin D deficiency 01/06/2016   Past Surgical History:  Procedure Laterality Date  . left ankle hardware removal    . left ankle surgery     Family History  Problem Relation Age of Onset  . COPD Mother   . Heart failure Mother   . COPD Father   . Heart failure Father    Social History  Substance Use Topics  . Smoking status: Current Every Day Smoker    Packs/day: 1.00    Types: Cigarettes  . Smokeless tobacco: Not on file  . Alcohol use No  she tried quitting in the hospital; she is down from 1 ppd, to maybe 7-8 cigs a day Interim  medical history since last visit reviewed. Allergies and medications reviewed  Review of Systems Per HPI unless specifically indicated above     Objective:    BP (!) 202/123 (BP Location: Right Arm)   Pulse 87   Resp 18   Ht 5\' 2"  (1.575 m)   Wt 164 lb 8 oz (74.6 kg)   LMP 08/09/2008 (Within Years)   SpO2 96%   BMI 30.09 kg/m   Wt Readings from Last 3 Encounters:  04/13/16 164 lb 8 oz (74.6 kg)  04/06/16 162 lb (73.5 kg)  03/02/16 163 lb (73.9 kg)    Physical Exam  Constitutional: She appears well-developed and well-nourished. No distress.  Appears older than stated age  Cardiovascular: Normal rate and regular rhythm.   Barely palpable right D pedis; I could not palpate left D pedis  Pulmonary/Chest: Effort normal. She has decreased breath sounds in the right middle field and the right lower field.  Abdominal: Soft. Bowel sounds are normal. There is no tenderness.  Skin:  Skin mottled on the legs and feet  Psychiatric: She has a normal mood and affect.  Rash on lower legs, trunk and arms No swollen glands palpated on in the cervical area on the right or left  Results for orders  placed or performed in visit on 04/06/16  Microscopic Examination  Result Value Ref Range   WBC, UA 11-30 (A) 0 - 5 /hpf   RBC, UA 3-10 (A) 0 - 2 /hpf   Epithelial Cells (non renal) >10 (A) 0 - 10 /hpf   Bacteria, UA Few None seen/Few  Urinalysis, Complete  Result Value Ref Range   Specific Gravity, UA 1.010 1.005 - 1.030   pH, UA 6.5 5.0 - 7.5   Color, UA Yellow Yellow   Appearance Ur Cloudy (A) Clear   Leukocytes, UA 1+ (A) Negative   Protein, UA Negative Negative/Trace   Glucose, UA Negative Negative   Ketones, UA Negative Negative   RBC, UA 2+ (A) Negative   Bilirubin, UA Negative Negative   Urobilinogen, Ur 0.2 0.2 - 1.0 mg/dL   Nitrite, UA Negative Negative   Microscopic Examination See below:       Assessment & Plan:   Problem List Items Addressed This Visit       Cardiovascular and Mediastinum   Essential hypertension - Primary   Relevant Medications   amLODipine (NORVASC) 5 MG tablet   Other Relevant Orders   Comprehensive metabolic panel    Other Visit Diagnoses   None.    Pyelonephritis- still with left CVA tenderness   - obtain CT abd/pelvis w/o; performed on 02/09/2016-negative  Pneumonia- start Cipro         -obtain chest x-ray- still pending  SUI  See Michiel CowboyShannon Cray Monnin, PA-C [facility]  Cysto negative  Placed on Vesicare  Evaluated by vein and vascular and  follow up was recommended on a prn basis  HTN  Patient is taking the nexium and lisinopril together  She will separate the medications, nexium in the am and bp in pm  Add amlodipine 5 mg  Recheck BP in 2 weeks  Hx of hypercalcium-recheck CMP  Rash-recheck in 2 weeks

## 2016-05-04 ENCOUNTER — Ambulatory Visit (INDEPENDENT_AMBULATORY_CARE_PROVIDER_SITE_OTHER): Payer: Self-pay | Admitting: Urology

## 2016-05-04 ENCOUNTER — Ambulatory Visit: Payer: Self-pay | Admitting: Urology

## 2016-05-04 ENCOUNTER — Encounter: Payer: Self-pay | Admitting: Urology

## 2016-05-04 VITALS — BP 172/84 | HR 69 | Ht 62.0 in | Wt 163.0 lb

## 2016-05-04 VITALS — BP 153/102 | HR 90 | Temp 97.5°F | Ht 62.0 in | Wt 164.0 lb

## 2016-05-04 DIAGNOSIS — N390 Urinary tract infection, site not specified: Secondary | ICD-10-CM

## 2016-05-04 DIAGNOSIS — N3941 Urge incontinence: Secondary | ICD-10-CM | POA: Insufficient documentation

## 2016-05-04 DIAGNOSIS — I10 Essential (primary) hypertension: Secondary | ICD-10-CM

## 2016-05-04 LAB — BLADDER SCAN AMB NON-IMAGING

## 2016-05-04 NOTE — Progress Notes (Signed)
05/04/2016 6:40 AM    Sheila [name] 02/04/1961 [id number]  Referring provider: No referring provider defined for this encounter.  No chief complaint on file.   HPI:  1 - Recurrent Urinary Tract Infection - pan-sensitive e.coli pyelo  Bacteremia 11/2015. CT w/o stones or hydro. Cysto normal. Non-diabetic. Post-menaupasal not on estrogens. No self-start meds. PVR 2017 "3056mL:mL (normal).   2 - Urge Urinary Incontinence - Vesicare 5mg  QD for uregency with leakage. She does have h/o constipation as well but is on miralax. Helped some with meds but she is thinking of stopping as her baseline bother is overall modest.   Today "Sheila Gineggy" is seen in f/u above.    PMH: Past Medical History:  Diagnosis Date  . Aorto-iliac atherosclerosis (HCC) 01/06/2016  . Asthma   . Hypertension   . Tobacco abuse 01/06/2016  . Vitamin D deficiency 01/06/2016    Surgical History: Past Surgical History:  Procedure Laterality Date  . left ankle hardware removal    . left ankle surgery      Home Medications:    Medication List       Accurate as of 05/04/16  6:40 AM. Always use your most recent med list.          amLODipine 5 MG tablet Commonly known as:  NORVASC Take 1 tablet (5 mg total) by mouth daily.   esomeprazole 40 MG capsule Commonly known as:  NEXIUM Take 1 capsule (40 mg total) by mouth daily at 12 noon.   ibuprofen 200 MG tablet Commonly known as:  ADVIL,MOTRIN Take 800 mg by mouth every 6 (six) hours as needed for headache or mild pain. Reported on 01/06/2016   lisinopril 20 MG tablet Commonly known as:  PRINIVIL,ZESTRIL Take 1 tablet (20 mg total) by mouth daily.   solifenacin 5 MG tablet Commonly known as:  VESICARE Take 5 mg by mouth daily.       Allergies:  Allergies  Allergen Reactions  . Vicodin [Hydrocodone-Acetaminophen] Nausea And Vomiting  . Hct [Hydrochlorothiazide] Palpitations  . Tramadol Palpitations    Family History: Family History  Problem  Relation Age of Onset  . COPD Mother   . Heart failure Mother   . COPD Father   . Heart failure Father     Social History:  reports that she has been smoking Cigarettes.  She has been smoking about 1.00 pack per day. She does not have any smokeless tobacco history on file. She reports that she does not drink alcohol or use drugs.   Review of Systems  Gastrointestinal (upper)  : Negative for upper GI symptoms  Gastrointestinal (lower) : Negative for lower GI symptoms  Constitutional : Negative for symptoms  Skin: Negative for skin symptoms  Eyes: Negative for eye symptoms  Ear/Nose/Throat : Negative for Ear/Nose/Throat symptoms  Hematologic/Lymphatic: Negative for Hematologic/Lymphatic symptoms  Cardiovascular : Negative for cardiovascular symptoms  Respiratory : Negative for respiratory symptoms  Endocrine: Negative for endocrine symptoms  Musculoskeletal: Negative for musculoskeletal symptoms  Neurological: Negative for neurological symptoms  Psychologic: Negative for psychiatric symptoms  Physical Exam: LMP 08/09/2008 (Within Years)   Constitutional:  Alert and oriented, No acute distress. Appears older than stated age.  HEENT: [place] AT, moist mucus membranes.  Trachea midline, no masses. Cardiovascular: No clubbing, cyanosis, or edema. Respiratory: Normal respiratory effort, no increased work of breathing. GI: Abdomen is soft, nontender, nondistended, no abdominal masses. Significant trncal obesity.  GU: No CVA tenderness. NO SP tenderness.  Skin: No rashes, bruises  or suspicious lesions. Lymph: No cervical or inguinal adenopathy. Neurologic: Grossly intact, no focal deficits, moving all 4 extremities. Psychiatric: Normal mood and affect.  Laboratory Data: Lab Results  Component Value Date   WBC 12.7 (H) 12/06/2015   HGB 12.6 12/06/2015   HCT 38.2 12/06/2015   MCV 91.9 12/06/2015   PLT 302 12/06/2015    Lab Results  Component Value Date    CREATININE 0.75 01/06/2016    No results found for: PSA  No results found for: TESTOSTERONE  No results found for: HGBA1C   Pertinent Imaging: As per HPI  Assessment & Plan:    1. Recurrent UTI  - no surgically modifiable factors on eval. Consider twice weekly topical estrace for introital acidifiation and / or self-start keflex if becomes truly recrrent.   2. Urge incontinence of urine - OAB wet. She empites well. Continue vesicare prn or DC if she desires. We did discuss more aggressive therapy (PTNS, BTX) but her bother is modest.   No Follow-up on file.  Sebastian Ache, MD  [facility] [address], [address] [place], [place] [address] [phone]

## 2016-05-04 NOTE — Progress Notes (Signed)
  Patient: Sheila [name] Nee Female    DOB: 05/01/1961   55 y.o.   MRN: [id number] Visit Date: 05/04/2016  Today's Provider: [facility]   Chief Complaint  Patient presents with  . Hypertension  . Medication Refill   Subjective:    HPI  Incontinence   - cystoscopy completed on 04/06/2016- normal   - manage incontinence conservatively   - not taking the Vesicare  HTN - 153/102 at today's visit  - taking amlodipine and lisinopril   - takes BP at home and is at 150's/90's     Allergies  Allergen Reactions  . Vicodin [Hydrocodone-Acetaminophen] Nausea And Vomiting  . Hct [Hydrochlorothiazide] Palpitations  . Tramadol Palpitations   Previous Medications   AMLODIPINE (NORVASC) 5 MG TABLET    Take 1 tablet (5 mg total) by mouth daily.   ESOMEPRAZOLE (NEXIUM) 40 MG CAPSULE    Take 1 capsule (40 mg total) by mouth daily at 12 noon.   IBUPROFEN (ADVIL,MOTRIN) 200 MG TABLET    Take 800 mg by mouth every 6 (six) hours as needed for headache or mild pain. Reported on 01/06/2016   LISINOPRIL (PRINIVIL,ZESTRIL) 20 MG TABLET    Take 1 tablet (20 mg total) by mouth daily.   SOLIFENACIN (VESICARE) 5 MG TABLET    Take 5 mg by mouth daily.    Review of Systems  Social History  Substance Use Topics  . Smoking status: Current Every Day Smoker    Packs/day: 1.00    Types: Cigarettes  . Smokeless tobacco: Never Used  . Alcohol use No   Objective:   BP (!) 153/102   Pulse 90   Temp 97.5 F (36.4 C)   Ht 5\' 2"  (1.575 m)   Wt 164 lb (74.4 kg)   LMP 08/09/2008 (Within Years)   BMI 30.00 kg/m   Physical Exam Constitutional: Well nourished. Alert and oriented, No acute distress. HEENT: [place] AT, moist mucus membranes. Trachea midline, no masses. Cardiovascular: No clubbing, cyanosis, or edema. Respiratory: Normal respiratory effort, no increased work of breathing. Skin: No rashes, bruises or suspicious lesions. Lymph: No cervical or inguinal adenopathy. Neurologic: Grossly  intact, no focal deficits, moving all 4 extremities. Psychiatric: Normal mood and affect.      Assessment & Plan:     1. Incontinence  - manage conservatively  - address again if it worsens  2. HTN  - uncontrolled  - taking BP meds  - increase the amlodipine to 10 mg from 5 mg  - RTC in 2 weeks for BP check  3. Pneumonia  - follow up chest x-ray is still pending  4. Hypercalcinemia  - recheck CMP      [facility]   [facility]

## 2016-05-05 LAB — COMPREHENSIVE METABOLIC PANEL
ALBUMIN: 4.2 g/dL (ref 3.5–5.5)
ALT: 21 IU/L (ref 0–32)
AST: 21 IU/L (ref 0–40)
Albumin/Globulin Ratio: 1.6 (ref 1.2–2.2)
Alkaline Phosphatase: 88 IU/L (ref 39–117)
BUN / CREAT RATIO: 8 — AB (ref 9–23)
BUN: 7 mg/dL (ref 6–24)
Bilirubin Total: 0.3 mg/dL (ref 0.0–1.2)
CALCIUM: 11.3 mg/dL — AB (ref 8.7–10.2)
CO2: 21 mmol/L (ref 18–29)
CREATININE: 0.85 mg/dL (ref 0.57–1.00)
Chloride: 106 mmol/L (ref 96–106)
GFR, EST AFRICAN AMERICAN: 89 mL/min/{1.73_m2} (ref >59)
GFR, EST NON AFRICAN AMERICAN: 77 mL/min/{1.73_m2} (ref >59)
GLUCOSE: 76 mg/dL (ref 65–99)
Globulin, Total: 2.6 g/dL (ref 1.5–4.5)
Potassium: 4.7 mmol/L (ref 3.5–5.2)
Sodium: 144 mmol/L (ref 134–144)
TOTAL PROTEIN: 6.8 g/dL (ref 6.0–8.5)

## 2016-05-18 ENCOUNTER — Ambulatory Visit: Payer: Self-pay | Admitting: Adult Health Nurse Practitioner

## 2016-05-18 VITALS — BP 145/88 | HR 81

## 2016-05-18 DIAGNOSIS — Z0130 Encounter for examination of blood pressure without abnormal findings: Secondary | ICD-10-CM

## 2016-05-18 NOTE — Patient Instructions (Addendum)
bp 145/88 No follow up created

## 2017-02-26 ENCOUNTER — Observation Stay
Admission: EM | Admit: 2017-02-26 | Discharge: 2017-02-28 | Disposition: A | Discharge status: Home / Self Care | Payer: Self-pay | Attending: Internal Medicine | Admitting: Internal Medicine

## 2017-02-26 ENCOUNTER — Encounter: Payer: Self-pay | Admitting: Medical Oncology

## 2017-02-26 ENCOUNTER — Emergency Department: Payer: Self-pay

## 2017-02-26 ENCOUNTER — Observation Stay: Payer: Self-pay

## 2017-02-26 DIAGNOSIS — Z79899 Other long term (current) drug therapy: Secondary | ICD-10-CM | POA: Insufficient documentation

## 2017-02-26 DIAGNOSIS — J449 Chronic obstructive pulmonary disease, unspecified: Secondary | ICD-10-CM | POA: Insufficient documentation

## 2017-02-26 DIAGNOSIS — F17210 Nicotine dependence, cigarettes, uncomplicated: Secondary | ICD-10-CM | POA: Insufficient documentation

## 2017-02-26 DIAGNOSIS — I10 Essential (primary) hypertension: Secondary | ICD-10-CM | POA: Insufficient documentation

## 2017-02-26 DIAGNOSIS — Z791 Long term (current) use of non-steroidal anti-inflammatories (NSAID): Secondary | ICD-10-CM | POA: Insufficient documentation

## 2017-02-26 DIAGNOSIS — K219 Gastro-esophageal reflux disease without esophagitis: Secondary | ICD-10-CM | POA: Insufficient documentation

## 2017-02-26 DIAGNOSIS — R51 Headache: Secondary | ICD-10-CM | POA: Insufficient documentation

## 2017-02-26 DIAGNOSIS — R519 Headache, unspecified: Secondary | ICD-10-CM

## 2017-02-26 DIAGNOSIS — J441 Chronic obstructive pulmonary disease with (acute) exacerbation: Secondary | ICD-10-CM | POA: Diagnosis present

## 2017-02-26 DIAGNOSIS — R079 Chest pain, unspecified: Principal | ICD-10-CM

## 2017-02-26 DIAGNOSIS — M1990 Unspecified osteoarthritis, unspecified site: Secondary | ICD-10-CM | POA: Insufficient documentation

## 2017-02-26 DIAGNOSIS — R9431 Abnormal electrocardiogram [ECG] [EKG]: Secondary | ICD-10-CM | POA: Insufficient documentation

## 2017-02-26 DIAGNOSIS — E876 Hypokalemia: Secondary | ICD-10-CM | POA: Insufficient documentation

## 2017-02-26 LAB — BASIC METABOLIC PANEL
Anion gap: 8 (ref 5–15)
BUN: 7 mg/dL (ref 6–20)
CALCIUM: 11.6 mg/dL — AB (ref 8.9–10.3)
CHLORIDE: 109 mmol/L (ref 101–111)
CO2: 23 mmol/L (ref 22–32)
CREATININE: 0.71 mg/dL (ref 0.44–1.00)
GFR calc Af Amer: >60 mL/min (ref >60)
GFR calc non Af Amer: >60 mL/min (ref >60)
GLUCOSE: 83 mg/dL (ref 65–99)
Potassium: 3.2 mmol/L — ABNORMAL LOW (ref 3.5–5.1)
Sodium: 140 mmol/L (ref 135–145)

## 2017-02-26 LAB — TROPONIN I
Troponin I: <0.03 ng/mL (ref <0.03)
Troponin I: <0.03 ng/mL (ref <0.03)

## 2017-02-26 LAB — CBC
HCT: 43.2 % (ref 35.0–47.0)
Hemoglobin: 14.5 g/dL (ref 12.0–16.0)
MCH: 30.8 pg (ref 26.0–34.0)
MCHC: 33.6 g/dL (ref 32.0–36.0)
MCV: 91.8 fL (ref 80.0–100.0)
PLATELETS: 301 10*3/uL (ref 150–440)
RBC: 4.71 MIL/uL (ref 3.80–5.20)
RDW: 13.4 % (ref 11.5–14.5)
WBC: 13.3 10*3/uL — ABNORMAL HIGH (ref 3.6–11.0)

## 2017-02-26 MED ORDER — ASPIRIN EC 81 MG PO TBEC
81.0000 mg | DELAYED_RELEASE_TABLET | Freq: Every day | ORAL | Status: DC
  Administered 2017-02-27 – 2017-02-28 (×2): 81 mg via ORAL
  Filled 2017-02-26 (×2): qty 1

## 2017-02-26 MED ORDER — PNEUMOCOCCAL VAC POLYVALENT 25 MCG/0.5ML IJ INJ: 0.5000 mL | INJECTION | INTRAMUSCULAR | Status: DC

## 2017-02-26 MED ORDER — NITROGLYCERIN 2 % TD OINT
TOPICAL_OINTMENT | TRANSDERMAL | Status: AC
  Administered 2017-02-26: 1 [in_us] via TOPICAL
  Filled 2017-02-26: qty 1

## 2017-02-26 MED ORDER — ONDANSETRON HCL 4 MG PO TABS: 4.0000 mg | ORAL_TABLET | Freq: Four times a day (QID) | ORAL | Status: DC | PRN

## 2017-02-26 MED ORDER — ONDANSETRON HCL 4 MG/2ML IJ SOLN: 4.0000 mg | Freq: Four times a day (QID) | INTRAMUSCULAR | Status: DC | PRN

## 2017-02-26 MED ORDER — IBUPROFEN 400 MG PO TABS
800.0000 mg | ORAL_TABLET | Freq: Four times a day (QID) | ORAL | Status: DC | PRN
  Administered 2017-02-26 – 2017-02-27 (×2): 800 mg via ORAL
  Filled 2017-02-26 (×2): qty 2

## 2017-02-26 MED ORDER — MORPHINE SULFATE (PF) 2 MG/ML IV SOLN: 2.0000 mg | INTRAVENOUS | Status: DC | PRN

## 2017-02-26 MED ORDER — PANTOPRAZOLE SODIUM 40 MG PO TBEC
40.0000 mg | DELAYED_RELEASE_TABLET | Freq: Every day | ORAL | Status: DC
  Administered 2017-02-27 – 2017-02-28 (×2): 40 mg via ORAL
  Filled 2017-02-26 (×2): qty 1

## 2017-02-26 MED ORDER — DOCUSATE SODIUM 100 MG PO CAPS
100.0000 mg | ORAL_CAPSULE | Freq: Two times a day (BID) | ORAL | Status: DC
  Administered 2017-02-27 – 2017-02-28 (×3): 100 mg via ORAL
  Filled 2017-02-26 (×3): qty 1

## 2017-02-26 MED ORDER — ACETAMINOPHEN 650 MG RE SUPP: 650.0000 mg | Freq: Four times a day (QID) | RECTAL | Status: DC | PRN

## 2017-02-26 MED ORDER — POTASSIUM CHLORIDE CRYS ER 20 MEQ PO TBCR
40.0000 meq | EXTENDED_RELEASE_TABLET | Freq: Once | ORAL | Status: AC
  Administered 2017-02-26: 40 meq via ORAL
  Filled 2017-02-26: qty 2

## 2017-02-26 MED ORDER — ACETAMINOPHEN 325 MG PO TABS
650.0000 mg | ORAL_TABLET | Freq: Four times a day (QID) | ORAL | Status: DC | PRN
  Administered 2017-02-27: 650 mg via ORAL
  Filled 2017-02-26: qty 2

## 2017-02-26 MED ORDER — ASPIRIN 81 MG PO CHEW
324.0000 mg | CHEWABLE_TABLET | Freq: Once | ORAL | Status: AC
  Administered 2017-02-26: 324 mg via ORAL

## 2017-02-26 MED ORDER — LISINOPRIL 20 MG PO TABS
20.0000 mg | ORAL_TABLET | Freq: Every day | ORAL | Status: DC
  Administered 2017-02-27 – 2017-02-28 (×2): 20 mg via ORAL
  Filled 2017-02-26 (×2): qty 1

## 2017-02-26 MED ORDER — DIPHENHYDRAMINE HCL 25 MG PO CAPS
25.0000 mg | ORAL_CAPSULE | ORAL | Status: AC
  Administered 2017-02-26: 25 mg via ORAL
  Filled 2017-02-26: qty 1

## 2017-02-26 MED ORDER — POTASSIUM CHLORIDE IN NACL 40-0.9 MEQ/L-% IV SOLN
INTRAVENOUS | Status: DC
  Administered 2017-02-26: 75 mL/h via INTRAVENOUS
  Filled 2017-02-26 (×3): qty 1000

## 2017-02-26 MED ORDER — IOPAMIDOL (ISOVUE-370) INJECTION 76%
75.0000 mL | Freq: Once | INTRAVENOUS | Status: AC | PRN
  Administered 2017-02-26: 75 mL via INTRAVENOUS

## 2017-02-26 MED ORDER — BISACODYL 10 MG RE SUPP: 10.0000 mg | Freq: Every day | RECTAL | Status: DC | PRN

## 2017-02-26 MED ORDER — ASPIRIN 81 MG PO CHEW
CHEWABLE_TABLET | ORAL | Status: AC
  Administered 2017-02-26: 324 mg via ORAL
  Filled 2017-02-26: qty 2

## 2017-02-26 MED ORDER — NITROGLYCERIN 2 % TD OINT
1.0000 [in_us] | TOPICAL_OINTMENT | Freq: Four times a day (QID) | TRANSDERMAL | Status: DC
  Administered 2017-02-27 – 2017-02-28 (×5): 1 [in_us] via TOPICAL
  Filled 2017-02-26 (×6): qty 1

## 2017-02-26 MED ORDER — OXYCODONE-ACETAMINOPHEN 5-325 MG PO TABS
1.0000 | ORAL_TABLET | ORAL | Status: AC
  Administered 2017-02-26: 1 via ORAL
  Filled 2017-02-26: qty 1

## 2017-02-26 MED ORDER — AMLODIPINE BESYLATE 5 MG PO TABS
5.0000 mg | ORAL_TABLET | Freq: Every day | ORAL | Status: DC
  Administered 2017-02-27 – 2017-02-28 (×2): 5 mg via ORAL
  Filled 2017-02-26 (×2): qty 1

## 2017-02-26 MED ORDER — ENOXAPARIN SODIUM 40 MG/0.4ML ~~LOC~~ SOLN
40.0000 mg | SUBCUTANEOUS | Status: DC
  Administered 2017-02-26 – 2017-02-27 (×2): 40 mg via SUBCUTANEOUS
  Filled 2017-02-26 (×2): qty 0.4

## 2017-02-26 MED ORDER — NITROGLYCERIN 0.4 MG SL SUBL: 0.4000 mg | SUBLINGUAL_TABLET | SUBLINGUAL | Status: DC | PRN

## 2017-02-26 MED ORDER — NITROGLYCERIN 2 % TD OINT
1.0000 [in_us] | TOPICAL_OINTMENT | TRANSDERMAL | Status: AC
  Administered 2017-02-26: 1 [in_us] via TOPICAL

## 2017-02-26 NOTE — H&P (Signed)
History and Physical    Lavone Nianeggy J Hartney ZOX:[id number] DOB: 12/09/1960 DOA: 02/26/2017  Referring physician: Dr. Fanny BienQuale PCP: System, Pcp Not In  Specialists: none  Chief Complaint: Chest pain  HPI: Lavone Nianeggy J Buser is a 56 y.o. female has a past medical history significant for HTN and COPD here with sharp/stabbing CP at rest radiating to left arm with LUE numbness and tingling. No fever. No N/V/D. No cardiac hx. In ER, troponin normal but EKG abnormal. She is now admitted.  Review of Systems: The patient denies anorexia, fever, weight loss,, vision loss, decreased hearing, hoarseness,  syncope,, peripheral edema, balance deficits, hemoptysis, abdominal pain, melena, hematochezia, severe indigestion/heartburn, hematuria, incontinence, genital sores, muscle weakness, suspicious skin lesions, transient blindness, difficulty walking, depression, unusual weight change, abnormal bleeding, enlarged lymph nodes, angioedema, and breast masses.   Past Medical History:  Diagnosis Date  . Aorto-iliac atherosclerosis (HCC) 01/06/2016  . Asthma   . COPD (chronic obstructive pulmonary disease) (HCC)   . Hypertension   . Tobacco abuse 01/06/2016  . Vitamin D deficiency 01/06/2016   Past Surgical History:  Procedure Laterality Date  . ANKLE FRACTURE SURGERY Left 11/07/2009  . left ankle hardware removal    . left ankle surgery     Social History:  reports that she has been smoking Cigarettes.  She has been smoking about 1.00 pack per day. She has never used smokeless tobacco. She reports that she does not drink alcohol or use drugs.  Allergies  Allergen Reactions  . Vicodin [Hydrocodone-Acetaminophen] Nausea And Vomiting  . Hct [Hydrochlorothiazide] Palpitations  . Tramadol Palpitations    Family History  Problem Relation Age of Onset  . COPD Mother   . Heart failure Mother   . COPD Father   . Heart failure Father     Prior to Admission medications   Medication Sig Start Date End Date  Taking? Authorizing Provider  amLODipine (NORVASC) 5 MG tablet Take 1 tablet (5 mg total) by mouth daily. 04/13/16   Michiel CowboyMcGowan, Shannon A, PA-C  esomeprazole (NEXIUM) 40 MG capsule Take 1 capsule (40 mg total) by mouth daily at 12 noon. 12/11/15   Michiel CowboyMcGowan, Shannon A, PA-C  ibuprofen (ADVIL,MOTRIN) 200 MG tablet Take 800 mg by mouth every 6 (six) hours as needed for headache or mild pain. Reported on 01/06/2016    [provider]  lisinopril (PRINIVIL,ZESTRIL) 20 MG tablet Take 1 tablet (20 mg total) by mouth daily. 12/11/15   Michiel CowboyMcGowan, Shannon A, PA-C  solifenacin (VESICARE) 5 MG tablet Take 5 mg by mouth daily. 04/06/16   [provider]   Physical Exam: Vitals:   02/26/17 1537 02/26/17 1540  BP:  (!) 154/94  Pulse:  83  Resp:  18  Temp:  97.8 F (36.6 C)  TempSrc:  Oral  SpO2:  97%  Weight: 77.1 kg (170 lb)   Height: 5\' 2"  (1.575 m)      General:  No apparent distress, WDWN, /AT  Eyes: PERRL, EOMI, no scleral icterus, conjunctiva clear  ENT: moist oropharynx without exudate, TM's benign, dentition good  Neck: supple, no lymphadenopathy. No bruits or thyromegaly  Cardiovascular: regular rate without MRG; 2+ peripheral pulses, no JVD, no peripheral edema  Respiratory: CTA biL, good air movement without wheezing, rhonchi or crackled. Respiratory effort normal  Abdomen: soft, non tender to palpation, positive bowel sounds, no guarding, no rebound  Skin: no rashes or lesions.  Musculoskeletal: normal bulk and tone, no joint swelling  Psychiatric: normal mood and affect,  A&OX3  Neurologic: CN 2-12 grossly intact, Motor strength 5/5 in all 4 groups with symmetric DTR's and non-focal sensory exam  Labs on Admission:  Basic Metabolic Panel:  Recent Labs Lab 02/26/17 1536  NA 140  K 3.2*  CL 109  CO2 23  GLUCOSE 83  BUN 7  CREATININE 0.71  CALCIUM 11.6*   Liver Function Tests: No results for input(s): AST, ALT, ALKPHOS, BILITOT, PROT, ALBUMIN in the last  168 hours. No results for input(s): LIPASE, AMYLASE in the last 168 hours. No results for input(s): AMMONIA in the last 168 hours. CBC:  Recent Labs Lab 02/26/17 1536  WBC 13.3*  HGB 14.5  HCT 43.2  MCV 91.8  PLT 301   Cardiac Enzymes:  Recent Labs Lab 02/26/17 1536  TROPONINI <0.03    BNP (last 3 results) No results for input(s): BNP in the last 8760 hours.  ProBNP (last 3 results) No results for input(s): PROBNP in the last 8760 hours.  CBG: No results for input(s): GLUCAP in the last 168 hours.  Radiological Exams on Admission: Dg Chest 2 View  Result Date: 02/26/2017 CLINICAL DATA:  Left-sided chest pain which shortness-of-breath since yesterday. EXAM: CHEST  2 VIEW COMPARISON:  12/04/2015 FINDINGS: Lungs are adequately inflated without consolidation or effusion. Possible 1 cm nodular density adjacent the left heart border. Cardiomediastinal silhouette is within normal. Remaining bones and soft tissues are normal. IMPRESSION: No acute cardiopulmonary disease. Possible 1 cm nodule adjacent the left heart border. Recommend noncontrast chest CT on an elective basis for further evaluation. Electronically Signed   By: Elberta Fortis M.D.   On: 02/26/2017 16:04   Ct Head Wo Contrast  Result Date: 02/26/2017 CLINICAL DATA:  Mild headache.  Feeling off balance for 2 days. EXAM: CT HEAD WITHOUT CONTRAST TECHNIQUE: Contiguous axial images were obtained from the base of the skull through the vertex without intravenous contrast. COMPARISON:  None. FINDINGS: Brain: No evidence of acute infarction, hemorrhage, hydrocephalus, extra-axial collection or mass lesion/mass effect. Suspect minimal white matter low-density, most convincing left periatrial. This would likely be microvascular ischemic given patient's vascular risk factors. Vascular: Atherosclerotic calcification.  No hyperdense vessel Skull: No acute or aggressive finding. Sinuses/Orbits: No acute finding. Other: 1 slice of the  right middle cranial fossa is excluded from view. IMPRESSION: No acute finding or explanation for the history. Electronically Signed   By: Marnee Spring M.D.   On: 02/26/2017 20:05    EKG: Independently reviewed.  Assessment/Plan Principal Problem:   Chest pain Active Problems:   COPD (chronic obstructive pulmonary disease) (HCC)   Abnormal EKG   Hypokalemia   Will observe on telemetry and obtain CT chest. Follow enzymes. Consult Cardiology and order echo. Repeat labs in Am after K+ supplementation.  Diet: low salt Fluids: NS with K+ at 75 DVT Prophylaxis: Lovenox  Code Status: FULL  Family Communication: none  Disposition Plan: home  Time spent: 50 min

## 2017-02-26 NOTE — ED Triage Notes (Signed)
Pt reports since yesterday she has been having left sided chest pains that are sharp in nature. Pt reports sob with pain.

## 2017-02-26 NOTE — Progress Notes (Signed)
Patient admitted with the diagnosis of chest pain. Alert and oriented x 4. Tele  box called to CCMD with [facility] Anar[facility] NT as a second verifier. Skin assessment done with Roderick PeeLisa R. RN, no skin issues to report. Patient was given Ibuprofen 800 mg for c/o left flank pain. Will re-assess and continue to monitor.

## 2017-02-26 NOTE — ED Provider Notes (Signed)
[facility] Emergency Department Provider Note   ____________________________________________   First MD Initiated Contact with Patient 02/26/17 1921     (approximate)  I have reviewed the triage vital signs and the nursing notes.   HISTORY  Chief Complaint Chest Pain and Shortness of Breath   HPI Sheila [name] is a 56 y.o. female   Patient reports that for the last week she's been having intermittent symptoms where she feels like her balance was off, feels like she is listing the left side. Comes off and on, lasts a few seconds to a few minutes.Not really certain of anything makes it better or worse  She also reports she's had a mild left sided throbbing headache for about a week. No fevers or chills. Today she began experiencing pain in the center of her chest, describes as a heaviness and discomfort with a mild feeling of nausea and slight shortness of breath. No wheezing. No cough.  Continues to endorse for Mild headache, denies any speech changes, no numbness in the arms or legs except the pain in her chest radiates somewhat down her left arm. No ripping or tearing pain.    Past Medical History:  Diagnosis Date  . Aorto-iliac atherosclerosis (HCC) 01/06/2016  . Asthma   . COPD (chronic obstructive pulmonary disease) (HCC)   . Hypertension   . Tobacco abuse 01/06/2016  . Vitamin D deficiency 01/06/2016    Patient Active Problem List   Diagnosis Date Noted  . Chest pain 02/26/2017  . Abnormal EKG 02/26/2017  . Hypokalemia 02/26/2017  . Urge incontinence of urine 05/04/2016  . Recurrent UTI 03/02/2016  . COPD (chronic obstructive pulmonary disease) (HCC) 01/06/2016  . Adrenal nodule (HCC) 01/06/2016  . Tobacco abuse 01/06/2016  . Aorto-iliac atherosclerosis (HCC) 01/06/2016  . Vitamin D deficiency 01/06/2016  . Lower abdominal pain 01/06/2016  . Myopia of both eyes 01/06/2016  . Hypercalcemia 01/06/2016  . Essential hypertension  12/11/2015  . Pyelonephritis 12/03/2015  . Infection and inflammatory reaction due to other internal orthopedic device, implant, and graft 02/11/2012    Past Surgical History:  Procedure Laterality Date  . ANKLE FRACTURE SURGERY Left 11/07/2009  . left ankle hardware removal    . left ankle surgery      Prior to Admission medications   Medication Sig Start Date End Date Taking? Authorizing Provider  amLODipine (NORVASC) 5 MG tablet Take 1 tablet (5 mg total) by mouth daily. 04/13/16   Michiel CowboyMcGowan, Shannon A, PA-C  esomeprazole (NEXIUM) 40 MG capsule Take 1 capsule (40 mg total) by mouth daily at 12 noon. 12/11/15   Michiel CowboyMcGowan, Shannon A, PA-C  ibuprofen (ADVIL,MOTRIN) 200 MG tablet Take 800 mg by mouth every 6 (six) hours as needed for headache or mild pain. Reported on 01/06/2016    [provider]  lisinopril (PRINIVIL,ZESTRIL) 20 MG tablet Take 1 tablet (20 mg total) by mouth daily. 12/11/15   Michiel CowboyMcGowan, Shannon A, PA-C  solifenacin (VESICARE) 5 MG tablet Take 5 mg by mouth daily. 04/06/16   [provider]    Allergies Vicodin [hydrocodone-acetaminophen]; Hct [hydrochlorothiazide]; and Tramadol  Family History  Problem Relation Age of Onset  . COPD Mother   . Heart failure Mother   . COPD Father   . Heart failure Father     Social History Social History  Substance Use Topics  . Smoking status: Current Every Day Smoker    Packs/day: 1.00    Types: Cigarettes  . Smokeless tobacco: Never Used  .  Alcohol use No    Review of Systems Constitutional: No fever/chills Eyes: No visual changes.no facial droop. ENT: No sore throat. Cardiovascular:see history of present illnessRespiratory: see history of present illness Gastrointestinal: No abdominal pain.  No nausea, no vomiting.  No diarrhea.  No constipation. Genitourinary: Negative for dysuria. Musculoskeletal: Negative for back pain. Skin: Negative for rash. Neurological: Negative for headaches, focal weakness or  numbnesssaid she felt a tingling feeling in her left arm/hand earlie with the chest pain.    ____________________________________________   PHYSICAL EXAM:  VITAL SIGNS: ED Triage Vitals  Enc Vitals Group     BP 02/26/17 1540 (!) 154/94     Pulse Rate 02/26/17 1540 83     Resp 02/26/17 1540 18     Temp 02/26/17 1540 97.8 F (36.6 C)     Temp Source 02/26/17 1540 Oral     SpO2 02/26/17 1540 97 %     Weight 02/26/17 1537 170 lb (77.1 kg)     Height 02/26/17 1537 5\' 2"  (1.575 m)     Head Circumference --      Peak Flow --      Pain Score 02/26/17 1537 8     Pain Loc --      Pain Edu? --      Excl. in GC? --     Constitutional: Alert and oriented. Well appearing and in no acute distress. Eyes: Conjunctivae are normal. Head: Atraumatic. Nose: No congestion/rhinnorhea. Mouth/Throat: Mucous membranes are moist. Neck: No stridor.   Cardiovascular: Normal rate, regular rhythm. Grossly normal heart sounds.  Good peripheral circulation. Respiratory: Normal respiratory effort.  No retractions. Lungs CTAB. Gastrointestinal: Soft and nontender. No distention. Musculoskeletal: No lower extremity tenderness nor edema. Neurologic:  Normal speech and language. No gross focal neurologic deficits are appreciated. No pronator drift. Normal cranial nerve exam. Moves all extremities without discomfort or pain. Normal strength in all extremities. Skin:  Skin is warm, dry and intact. No rash noted. Psychiatric: Mood and affect are normal. Speech and behavior are normal.  ____________________________________________   LABS (all labs ordered are listed, but only abnormal results are displayed)  Labs Reviewed  BASIC METABOLIC PANEL - Abnormal; Notable for the following:       Result Value   Potassium 3.2 (*)    Calcium 11.6 (*)    All other components within normal limits  CBC - Abnormal; Notable for the following:    WBC 13.3 (*)    All other components within normal limits  TROPONIN I    TROPONIN I   ____________________________________________  EKG  Reviewed and interpreted me at 1540 Heart rate 90 QRS 80 QTc 450 Normal sinus rhythm, there is mild depression noted in leads 2 and aVF, nonspecific to evaluate with a slightdepressed versus inverted appearance also noted in V5 through V6  Compared with previous EKG from 2015, EKG changes seem to be slightly new. ____________________________________________  RADIOLOGY  Dg Chest 2 View  Result Date: 02/26/2017 CLINICAL DATA:  Left-sided chest pain which shortness-of-breath since yesterday. EXAM: CHEST  2 VIEW COMPARISON:  12/04/2015 FINDINGS: Lungs are adequately inflated without consolidation or effusion. Possible 1 cm nodular density adjacent the left heart border. Cardiomediastinal silhouette is within normal. Remaining bones and soft tissues are normal. IMPRESSION: No acute cardiopulmonary disease. Possible 1 cm nodule adjacent the left heart border. Recommend noncontrast chest CT on an elective basis for further evaluation. Electronically Signed   By: Elberta Fortis M.D.   On: 02/26/2017 16:04  Ct Head Wo Contrast  Result Date: 02/26/2017 CLINICAL DATA:  Mild headache.  Feeling off balance for 2 days. EXAM: CT HEAD WITHOUT CONTRAST TECHNIQUE: Contiguous axial images were obtained from the base of the skull through the vertex without intravenous contrast. COMPARISON:  None. FINDINGS: Brain: No evidence of acute infarction, hemorrhage, hydrocephalus, extra-axial collection or mass lesion/mass effect. Suspect minimal white matter low-density, most convincing left periatrial. This would likely be microvascular ischemic given patient's vascular risk factors. Vascular: Atherosclerotic calcification.  No hyperdense vessel Skull: No acute or aggressive finding. Sinuses/Orbits: No acute finding. Other: 1 slice of the right middle cranial fossa is excluded from view. IMPRESSION: No acute finding or explanation for the history.  Electronically Signed   By: Marnee Spring M.D.   On: 02/26/2017 20:05      ____________________________________________   PROCEDURES  Procedure(s) performed: None  Procedures  Critical Care performed: No  ____________________________________________   INITIAL IMPRESSION / ASSESSMENT AND PLAN / ED COURSE  Pertinent labs & imaging results that were available during my care of the patient were reviewed by me and considered in my medical decision making (see chart for details).      ----------------------------------------- 9:06 PM on 02/26/2017 -----------------------------------------  Work up this far reassuring, however some T wave abnormality is noted. Thepatient'sconcernofchestdiscomfortintheleftarmwithtingling. She is notably moderate risk by heart score  Plan to admit the patient for chest pain evaluation and further workup for her presentation under the hospitalist service. Patient agreeable.  ____________________________________________   FINAL CLINICAL IMPRESSION(S) / ED DIAGNOSES  Final diagnoses:  Moderate risk chest pain  Nonintractable headache, unspecified chronicity pattern, unspecified headache type      NEW MEDICATIONS STARTED DURING THIS VISIT:  New Prescriptions   No medications on file     Note:  This document was prepared using Dragon voice recognition software and may include unintentional dictation errors.     Sharyn Creamer, MD 02/26/17 2106

## 2017-02-27 ENCOUNTER — Observation Stay
Admit: 2017-02-27 | Discharge: 2017-02-27 | Disposition: A | Discharge status: Home / Self Care | Payer: Self-pay | Attending: Internal Medicine | Admitting: Internal Medicine

## 2017-02-27 LAB — COMPREHENSIVE METABOLIC PANEL
ALBUMIN: 3.4 g/dL — AB (ref 3.5–5.0)
ALK PHOS: 64 U/L (ref 38–126)
ALT: 14 U/L (ref 14–54)
ANION GAP: 4 — AB (ref 5–15)
AST: 18 U/L (ref 15–41)
BILIRUBIN TOTAL: 0.5 mg/dL (ref 0.3–1.2)
BUN: 11 mg/dL (ref 6–20)
CALCIUM: 10.6 mg/dL — AB (ref 8.9–10.3)
CO2: 25 mmol/L (ref 22–32)
Chloride: 113 mmol/L — ABNORMAL HIGH (ref 101–111)
Creatinine, Ser: 0.66 mg/dL (ref 0.44–1.00)
GFR calc Af Amer: >60 mL/min (ref >60)
GLUCOSE: 113 mg/dL — AB (ref 65–99)
Potassium: 3.6 mmol/L (ref 3.5–5.1)
Sodium: 142 mmol/L (ref 135–145)
TOTAL PROTEIN: 6.3 g/dL — AB (ref 6.5–8.1)

## 2017-02-27 LAB — CBC
HCT: 41.1 % (ref 35.0–47.0)
HEMOGLOBIN: 13.7 g/dL (ref 12.0–16.0)
MCH: 30.9 pg (ref 26.0–34.0)
MCHC: 33.4 g/dL (ref 32.0–36.0)
MCV: 92.4 fL (ref 80.0–100.0)
Platelets: 282 10*3/uL (ref 150–440)
RBC: 4.45 MIL/uL (ref 3.80–5.20)
RDW: 13.2 % (ref 11.5–14.5)
WBC: 10.7 10*3/uL (ref 3.6–11.0)

## 2017-02-27 LAB — TROPONIN I: TROPONIN I: 0.04 ng/mL — AB (ref <0.03)

## 2017-02-27 MED ORDER — SODIUM CHLORIDE 0.9% FLUSH
3.0000 mL | Freq: Two times a day (BID) | INTRAVENOUS | Status: DC
Start: 2017-02-28 — End: 2017-02-28
  Administered 2017-02-28 (×2): 3 mL via INTRAVENOUS

## 2017-02-27 NOTE — Consult Note (Signed)
[facility]  CARDIOLOGY CONSULT NOTE  Patient ID: Sheila [name] MRN: [id number] DOB/AGE: 1961-07-09 56 y.o.  Admit date: 02/26/2017 Referring Physician [facility] Primary Physician  Primary Cardiologist  Reason for Consultation Chest pain  HPI: 56 year old female referred for evaluation chest pain. Patient has known essential hypertension and COPD however, follow-up with her primary care physician regularly. He presents to [facility] emergency room with new-onset chest pain, described as sharp and stabbing sensation, with radiation to her left arm, with associated numbness and tingling. Revealed normal sinus rhythm with nonspecific ST abnormalities laterally. Admission labs were notable for negative troponin less than 0.032.  Review of systems complete and found to be negative unless listed above     Past Medical History:  Diagnosis Date  . Aorto-iliac atherosclerosis (HCC) 01/06/2016  . Asthma   . COPD (chronic obstructive pulmonary disease) (HCC)   . Hypertension   . Tobacco abuse 01/06/2016  . Vitamin D deficiency 01/06/2016    Past Surgical History:  Procedure Laterality Date  . ANKLE FRACTURE SURGERY Left 11/07/2009  . left ankle hardware removal    . left ankle surgery      Prescriptions Prior to Admission  Medication Sig Dispense Refill Last Dose  . amLODipine (NORVASC) 5 MG tablet Take 1 tablet (5 mg total) by mouth daily. 90 tablet 0 Taking  . esomeprazole (NEXIUM) 40 MG capsule Take 1 capsule (40 mg total) by mouth daily at 12 noon. 90 capsule 3 Taking  . ibuprofen (ADVIL,MOTRIN) 200 MG tablet Take 800 mg by mouth every 6 (six) hours as needed for headache or mild pain. Reported on 01/06/2016   Taking  . lisinopril (PRINIVIL,ZESTRIL) 20 MG tablet Take 1 tablet (20 mg total) by mouth daily. 90 tablet 3 Taking  . solifenacin (VESICARE) 5 MG tablet Take 5 mg by mouth daily.   Taking   Social History   Social History  . Marital status: Divorced    Spouse name: N/A  . Number  of children: N/A  . Years of education: N/A   Occupational History  . Not on file.   Social History Main Topics  . Smoking status: Current Every Day Smoker    Packs/day: 1.00    Types: Cigarettes  . Smokeless tobacco: Never Used  . Alcohol use No  . Drug use: No  . Sexual activity: Not on file   Other Topics Concern  . Not on file   Social History Narrative  . No narrative on file    Family History  Problem Relation Age of Onset  . COPD Mother   . Heart failure Mother   . COPD Father   . Heart failure Father       Review of systems complete and found to be negative unless listed above      PHYSICAL EXAM  General: Well developed, well nourished, in no acute distress HEENT:  Normocephalic and atramatic Neck:  No JVD.  Lungs: Clear bilaterally to auscultation and percussion. Heart: HRRR . Normal S1 and S2 without gallops or murmurs.  Abdomen: Bowel sounds are positive, abdomen soft and non-tender  Msk:  Back normal, normal gait. Normal strength and tone for age. Extremities: No clubbing, cyanosis or edema.   Neuro: Alert and oriented X 3. Psych:  Good affect, responds appropriately  Labs:   Lab Results  Component Value Date   WBC 10.7 02/27/2017   HGB 13.7 02/27/2017   HCT 41.1 02/27/2017   MCV 92.4 02/27/2017   PLT 282 02/27/2017  Recent Labs Lab 02/27/17 0305  NA 142  K 3.6  CL 113*  CO2 25  BUN 11  CREATININE 0.66  CALCIUM 10.6*  PROT 6.3*  BILITOT 0.5  ALKPHOS 64  ALT 14  AST 18  GLUCOSE 113*   Lab Results  Component Value Date   TROPONINI <0.03 02/27/2017   No results found for: CHOL No results found for: HDL No results found for: LDLCALC No results found for: TRIG No results found for: CHOLHDL No results found for: LDLDIRECT    Radiology: Dg Chest 2 View  Result Date: 02/26/2017 CLINICAL DATA:  Left-sided chest pain which shortness-of-breath since yesterday. EXAM: CHEST  2 VIEW COMPARISON:  12/04/2015 FINDINGS: Lungs are  adequately inflated without consolidation or effusion. Possible 1 cm nodular density adjacent the left heart border. Cardiomediastinal silhouette is within normal. Remaining bones and soft tissues are normal. IMPRESSION: No acute cardiopulmonary disease. Possible 1 cm nodule adjacent the left heart border. Recommend noncontrast chest CT on an elective basis for further evaluation. Electronically Signed   By: Elberta Fortis M.D.   On: 02/26/2017 16:04   Ct Head Wo Contrast  Result Date: 02/26/2017 CLINICAL DATA:  Mild headache.  Feeling off balance for 2 days. EXAM: CT HEAD WITHOUT CONTRAST TECHNIQUE: Contiguous axial images were obtained from the base of the skull through the vertex without intravenous contrast. COMPARISON:  None. FINDINGS: Brain: No evidence of acute infarction, hemorrhage, hydrocephalus, extra-axial collection or mass lesion/mass effect. Suspect minimal white matter low-density, most convincing left periatrial. This would likely be microvascular ischemic given patient's vascular risk factors. Vascular: Atherosclerotic calcification.  No hyperdense vessel Skull: No acute or aggressive finding. Sinuses/Orbits: No acute finding. Other: 1 slice of the right middle cranial fossa is excluded from view. IMPRESSION: No acute finding or explanation for the history. Electronically Signed   By: Marnee Spring M.D.   On: 02/26/2017 20:05   Ct Angio Chest Pe W Or Wo Contrast  Result Date: 02/26/2017 CLINICAL DATA:  Sharp stabbing chest pain radiating to left arm. No fever. EXAM: CT ANGIOGRAPHY CHEST WITH CONTRAST TECHNIQUE: Multidetector CT imaging of the chest was performed using the standard protocol during bolus administration of intravenous contrast. Multiplanar CT image reconstructions and MIPs were obtained to evaluate the vascular anatomy. CONTRAST:  75 cc Isovue 370 IV COMPARISON:  Same day CXR FINDINGS: Cardiovascular: There is cardiomegaly without pericardial effusion. No pulmonary embolus.  Aortic atherosclerosis without aneurysm or dissection. Mediastinum/Nodes: No enlarged mediastinal, hilar, or axillary lymph nodes. Small subcentimeter right hilar, right tracheobronchial and left lower paratracheal lymph nodes measuring up to 9 mm short axis. Thyroid gland, trachea, and esophagus demonstrate no significant findings. Lungs/Pleura: There is a noncalcified 13.8 mm in average nodular density in the posterior left lower lobe. Bibasilar dependent atelectasis is noted with mild centrilobular emphysematous changes in the upper lobes. No effusion or pneumothorax. Upper Abdomen: No acute abnormality. Musculoskeletal: Small Schmorl's node of T9. No acute osseous appearing abnormality. No suspicious osseous lesions. Review of the MIP images confirms the above findings. IMPRESSION: 1. 13.8 mm nodular density in the left lower lobe abutting the pleura. No associated lymphadenopathy or effusion. Consider one of the following in 3 months for both low-risk and high-risk individuals: (a) repeat chest CT, (b) follow-up PET-CT, or (c) tissue sampling. This recommendation follows the consensus statement: Guidelines for Management of Incidental Pulmonary Nodules Detected on CT Images: From the Fleischner Society 2017; Radiology 2017; 284:228-243. 2. No acute pulmonary embolus. 3. Mild centrilobular emphysema  with aortic atherosclerosis. Aortic Atherosclerosis (ICD10-I70.0) and Emphysema (ICD10-J43.9). Electronically Signed   By: Tollie Ethavid  Kwon M.D.   On: 02/26/2017 21:36    EKG: Normal sinus rhythm, nonspecific lateral ST abnormalities  ASSESSMENT AND PLAN:   1. New-onset chest pain, with typical atypical features, nonspecific ST abnormalities observed on ECG, with negative troponin 2. COPD, patient reports unable to walk on a treadmill  Recommendations  1. Continue current medications 2. Defer full dose anticoagulation 3. Lexiscan Myoview in a.m.  Signed: Marcina MillardAlexander Edyn Qazi MD,PhD, [facility] 02/27/2017, 10:37  AM

## 2017-02-27 NOTE — Progress Notes (Signed)
[facility] [place] [facility]   PATIENT NAME: Sheila [name]    MR#:  [id number]  DATE OF BIRTH:  18-Apr-1961  SUBJECTIVE:   Patient here due to atypical chest pain. Still having some chest pain, but no shortness of breath, nausea, vomiting or palpitations or diaphoresis. Seen by cardiology and plan for nuclear medicine stress test tomorrow.  REVIEW OF SYSTEMS:    Review of Systems  Constitutional: Negative for chills and fever.  HENT: Negative for congestion and tinnitus.   Eyes: Negative for blurred vision and double vision.  Respiratory: Negative for cough, shortness of breath and wheezing.   Cardiovascular: Negative for chest pain, orthopnea and PND.  Gastrointestinal: Negative for abdominal pain, diarrhea, nausea and vomiting.  Genitourinary: Negative for dysuria and hematuria.  Neurological: Negative for dizziness, sensory change and focal weakness.  All other systems reviewed and are negative.   Nutrition: Heart Healthy Tolerating Diet: Yes Tolerating PT: Ambulatory  DRUG ALLERGIES:   Allergies  Allergen Reactions  . Vicodin [Hydrocodone-Acetaminophen] Nausea And Vomiting  . Hct [Hydrochlorothiazide] Palpitations  . Tramadol Palpitations    VITALS:  Blood pressure (!) 168/118, pulse (!) 57, temperature (!) 97.4 F (36.3 C), temperature source Oral, resp. rate 18, height 5\' 2"  (1.575 m), weight 72.9 kg (160 lb 11.2 oz), last menstrual period 08/09/2008, SpO2 98 %.  PHYSICAL EXAMINATION:   Physical Exam  GENERAL:  56 y.o.-year-old patient lying in bed in no acute distress.  EYES: Pupils equal, round, reactive to light and accommodation. No scleral icterus. Extraocular muscles intact.  HEENT: Head atraumatic, normocephalic. Oropharynx and nasopharynx clear.  NECK:  Supple, no jugular venous distention. No thyroid enlargement, no tenderness.  LUNGS: Normal breath sounds bilaterally, no wheezing, rales, rhonchi. No use of accessory muscles of  respiration.  CARDIOVASCULAR: S1, S2 normal. No murmurs, rubs, or gallops.  ABDOMEN: Soft, nontender, nondistended. Bowel sounds present. No organomegaly or mass.  EXTREMITIES: No cyanosis, clubbing or edema b/l.    NEUROLOGIC: Cranial nerves II through XII are intact. No focal Motor or sensory deficits b/l.   PSYCHIATRIC: The patient is alert and oriented x 3.  SKIN: No obvious rash, lesion, or ulcer.    LABORATORY PANEL:   CBC  Recent Labs Lab 02/27/17 0305  WBC 10.7  HGB 13.7  HCT 41.1  PLT 282   ------------------------------------------------------------------------------------------------------------------  Chemistries   Recent Labs Lab 02/27/17 0305  NA 142  K 3.6  CL 113*  CO2 25  GLUCOSE 113*  BUN 11  CREATININE 0.66  CALCIUM 10.6*  AST 18  ALT 14  ALKPHOS 64  BILITOT 0.5   ------------------------------------------------------------------------------------------------------------------  Cardiac Enzymes  Recent Labs Lab 02/27/17 0842  TROPONINI <0.03   ------------------------------------------------------------------------------------------------------------------  RADIOLOGY:  Dg Chest 2 View  Result Date: 02/26/2017 CLINICAL DATA:  Left-sided chest pain which shortness-of-breath since yesterday. EXAM: CHEST  2 VIEW COMPARISON:  12/04/2015 FINDINGS: Lungs are adequately inflated without consolidation or effusion. Possible 1 cm nodular density adjacent the left heart border. Cardiomediastinal silhouette is within normal. Remaining bones and soft tissues are normal. IMPRESSION: No acute cardiopulmonary disease. Possible 1 cm nodule adjacent the left heart border. Recommend noncontrast chest CT on an elective basis for further evaluation. Electronically Signed   By: Elberta Fortis M.D.   On: 02/26/2017 16:04   Ct Head Wo Contrast  Result Date: 02/26/2017 CLINICAL DATA:  Mild headache.  Feeling off balance for 2 days. EXAM: CT HEAD WITHOUT CONTRAST  TECHNIQUE: Contiguous axial images were obtained  from the base of the skull through the vertex without intravenous contrast. COMPARISON:  None. FINDINGS: Brain: No evidence of acute infarction, hemorrhage, hydrocephalus, extra-axial collection or mass lesion/mass effect. Suspect minimal white matter low-density, most convincing left periatrial. This would likely be microvascular ischemic given patient's vascular risk factors. Vascular: Atherosclerotic calcification.  No hyperdense vessel Skull: No acute or aggressive finding. Sinuses/Orbits: No acute finding. Other: 1 slice of the right middle cranial fossa is excluded from view. IMPRESSION: No acute finding or explanation for the history. Electronically Signed   By: Marnee SpringJonathon  Watts M.D.   On: 02/26/2017 20:05   Ct Angio Chest Pe W Or Wo Contrast  Result Date: 02/26/2017 CLINICAL DATA:  Sharp stabbing chest pain radiating to left arm. No fever. EXAM: CT ANGIOGRAPHY CHEST WITH CONTRAST TECHNIQUE: Multidetector CT imaging of the chest was performed using the standard protocol during bolus administration of intravenous contrast. Multiplanar CT image reconstructions and MIPs were obtained to evaluate the vascular anatomy. CONTRAST:  75 cc Isovue 370 IV COMPARISON:  Same day CXR FINDINGS: Cardiovascular: There is cardiomegaly without pericardial effusion. No pulmonary embolus. Aortic atherosclerosis without aneurysm or dissection. Mediastinum/Nodes: No enlarged mediastinal, hilar, or axillary lymph nodes. Small subcentimeter right hilar, right tracheobronchial and left lower paratracheal lymph nodes measuring up to 9 mm short axis. Thyroid gland, trachea, and esophagus demonstrate no significant findings. Lungs/Pleura: There is a noncalcified 13.8 mm in average nodular density in the posterior left lower lobe. Bibasilar dependent atelectasis is noted with mild centrilobular emphysematous changes in the upper lobes. No effusion or pneumothorax. Upper Abdomen: No acute  abnormality. Musculoskeletal: Small Schmorl's node of T9. No acute osseous appearing abnormality. No suspicious osseous lesions. Review of the MIP images confirms the above findings. IMPRESSION: 1. 13.8 mm nodular density in the left lower lobe abutting the pleura. No associated lymphadenopathy or effusion. Consider one of the following in 3 months for both low-risk and high-risk individuals: (a) repeat chest CT, (b) follow-up PET-CT, or (c) tissue sampling. This recommendation follows the consensus statement: Guidelines for Management of Incidental Pulmonary Nodules Detected on CT Images: From the Fleischner Society 2017; Radiology 2017; 284:228-243. 2. No acute pulmonary embolus. 3. Mild centrilobular emphysema with aortic atherosclerosis. Aortic Atherosclerosis (ICD10-I70.0) and Emphysema (ICD10-J43.9). Electronically Signed   By: Tollie Ethavid  Kwon M.D.   On: 02/26/2017 21:36     ASSESSMENT AND PLAN:   56 year old female with past medical history of hypertension, COPD, ongoing tobacco abuse who presented to the hospital due to chest pain.  1. Chest pain-patient does have risk factors given her tobacco abuse history of hypertension. At her symptoms although her very atypical for angina.  -CT angiogram is negative for any acute pulmonary embolism. EKG does not show any acute ST or T-wave changes. -Cardiac markers 3 have been negative. Plan for nuclear medicine stress test tomorrow. -Continue aspirin, lisinopril, nitroglycerin -Appreciate cardiology input.  2. Essential hypertension-continue lisinopril, Norvasc.  3. OA - cont. PRN motrin.   4. GERD - cont. Protonix.     All the records are reviewed and case discussed with Care Management/Social Worker. Management plans discussed with the patient, family and they are in agreement.  CODE STATUS: Full  DVT Prophylaxis: Lovenox  TOTAL TIME TAKING CARE OF THIS PATIENT: 30 minutes.   POSSIBLE D/C IN 1-2 DAYS, DEPENDING ON CLINICAL  CONDITION.   Houston SirenSAINANI,Frankey Botting J M.D on 02/27/2017 at 3:34 PM  Between 7am to 6pm - Pager - [phone]  After 6pm go to [email or web address] - password [email or web address]  [facility]  [facility] [employer] [facility]  Office  [phone]  CC: Primary care physician; System, Pcp Not In

## 2017-02-27 NOTE — Clinical Social Work Note (Signed)
CSW received consult that patient cannot afford medications. The RNCM has also been consulted. CSW is signing off as this is the purview of RNCM.  Argentina PonderKaren Martha Moritz Lever, MSW, Theresia MajorsLCSWA [phone]

## 2017-02-28 ENCOUNTER — Observation Stay: Payer: Self-pay

## 2017-02-28 LAB — ECHOCARDIOGRAM COMPLETE
Area-P 1/2: 2.47 cm2
CHL CUP MV DEC (S): 303
E/e' ratio: 12.96
EWDT: 303 ms
FS: 36 % (ref 28–44)
HEIGHTINCHES: 62 in
IV/PV OW: 0.95
LA diam end sys: 37 mm
LA vol A4C: 34.8 ml
LA vol: 45.3 mL
LADIAMINDEX: 2.04 cm/m2
LASIZE: 37 mm
LAVOLIN: 25 mL/m2
LV TDI E'LATERAL: 7.95
LV e' LATERAL: 7.95 cm/s
LVEEAVG: 12.96
LVEEMED: 12.96
Lateral S' vel: 12.3 cm/s
MV Peak grad: 4 mmHg
MV pk A vel: 105 m/s
MVPKEVEL: 103 m/s
MVSPHT: 89 ms
PW: 10.5 mm — AB (ref 0.6–1.1)
TAPSE: 25.2 mm
TDI e' medial: 4.95
WEIGHTICAEL: 2571.2 [oz_av]

## 2017-02-28 LAB — NM MYOCAR MULTI W/SPECT W/WALL MOTION / EF
CHL CUP RESTING HR STRESS: 43 {beats}/min
CSEPED: 1 min
CSEPEDS: 0 s
CSEPEW: 1 METS
CSEPPHR: 90 {beats}/min
LV dias vol: 76 mL (ref 46–106)
LV sys vol: 27 mL
MPHR: 164 {beats}/min
NUC STRESS TID: 1.04
Percent HR: 54 %
SDS: 8
SRS: 0
SSS: 8

## 2017-02-28 LAB — HIV ANTIBODY (ROUTINE TESTING W REFLEX): HIV Screen 4th Generation wRfx: NONREACTIVE

## 2017-02-28 MED ORDER — TECHNETIUM TC 99M TETROFOSMIN IV KIT
13.3900 | PACK | Freq: Once | INTRAVENOUS | Status: AC | PRN
  Administered 2017-02-28: 13.39 via INTRAVENOUS

## 2017-02-28 MED ORDER — TECHNETIUM TC 99M TETROFOSMIN IV KIT
30.0000 | PACK | Freq: Once | INTRAVENOUS | Status: AC | PRN
  Administered 2017-02-28: 31.87 via INTRAVENOUS

## 2017-02-28 MED ORDER — REGADENOSON 0.4 MG/5ML IV SOLN
0.4000 mg | Freq: Once | INTRAVENOUS | Status: AC
  Administered 2017-02-28: 0.4 mg via INTRAVENOUS

## 2017-02-28 NOTE — Plan of Care (Signed)
Problem: Education: Goal: Knowledge of [facility] Education information/materials will improve Outcome: Not Progressing Patient needs more encouragement to cease smoking. Patient was smoking in bathroom, explained the hospital policy and affects on cardiovascular system. Offered patient a nicotine patch X2.

## 2017-02-28 NOTE — Discharge Instructions (Signed)
Chest Wall Pain °Chest wall pain is pain in or around the bones and muscles of your chest. Sometimes, an injury causes this pain. Sometimes, the cause may not be known. This pain may take several weeks or longer to get better. °Follow these instructions at home: °Pay attention to any changes in your symptoms. Take these actions to help with your pain: °· Rest as told by your doctor. °· Avoid activities that cause pain. Try not to use your chest, belly (abdominal), or side muscles to lift heavy things. °· If directed, apply ice to the painful area: °? Put ice in a plastic bag. °? Place a towel between your skin and the bag. °? Leave the ice on for 20 minutes, 2-3 times per day. °· Take over-the-counter and prescription medicines only as told by your doctor. °· Do not use tobacco products, including cigarettes, chewing tobacco, and e-cigarettes. If you need help quitting, ask your doctor. °· Keep all follow-up visits as told by your doctor. This is important. ° °Contact a doctor if: °· You have a fever. °· Your chest pain gets worse. °· You have new symptoms. °Get help right away if: °· You feel sick to your stomach (nauseous) or you throw up (vomit). °· You feel sweaty or light-headed. °· You have a cough with phlegm (sputum) or you cough up blood. °· You are short of breath. °This information is not intended to replace advice given to you by your health care provider. Make sure you discuss any questions you have with your health care provider. °Document Released: 01/12/2008 Document Revised: 01/01/2016 Document Reviewed: 10/21/2014 °Elsevier Interactive Patient Education © 2018 [employer] Inc. ° °

## 2017-02-28 NOTE — Care Management (Signed)
CM consult. Placed in observation for chest pain. negative troponins. No past cardiac history. Patient is known to [facility].  CM made patient an appointment at [facility] on July 31 at 7pm.  Patient is not discharging on any new medications and has a supply of current medications.  No other needs identified

## 2017-02-28 NOTE — Progress Notes (Signed)
Patient discharged home as ordered,vital signs within normal limits,escorted by staff member via wheel chair and transported by daughter.

## 2017-03-02 NOTE — Discharge Summary (Signed)
[facility] [place] [facility]   PATIENT NAME: Sheila [name]    MR#:  [id number]  DATE OF BIRTH:  01/23/1961  DATE OF ADMISSION:  02/26/2017   ADMITTING PHYSICIAN: Marguarite ArbourJeffrey D Sparks, MD  DATE OF DISCHARGE: 02/28/2017  4:00 PM  PRIMARY CARE PHYSICIAN: Sheila [name], Sheila P, MD   ADMISSION DIAGNOSIS:  Chest pain [R07.9] Moderate risk chest pain [R07.9] Nonintractable headache, unspecified chronicity pattern, unspecified headache type [R51] DISCHARGE DIAGNOSIS:  Principal Problem:   Chest pain Active Problems:   COPD (chronic obstructive pulmonary disease) (HCC)   Abnormal EKG   Hypokalemia  SECONDARY DIAGNOSIS:   Past Medical History:  Diagnosis Date  . Aorto-iliac atherosclerosis (HCC) 01/06/2016  . Asthma   . COPD (chronic obstructive pulmonary disease) (HCC)   . Hypertension   . Tobacco abuse 01/06/2016  . Vitamin D deficiency 01/06/2016   HOSPITAL COURSE:  56 year old female with past medical history of hypertension, COPD, ongoing tobacco abuse who presented to the hospital due to chest pain.  1. Chest pain- unlikely cardiac. -CT angiogram is negative for any acute pulmonary embolism. EKG does not show any acute ST or T-wave changes. -Cardiac markers 3 have been negative. neg nuclear medicine stress test  DISCHARGE CONDITIONS:  stable CONSULTS OBTAINED:  Treatment Team:  Marcina MillardParaschos, Alexander, MD DRUG ALLERGIES:   Allergies  Allergen Reactions  . Vicodin [Hydrocodone-Acetaminophen] Nausea And Vomiting  . Hct [Hydrochlorothiazide] Palpitations  . Tramadol Palpitations   DISCHARGE MEDICATIONS:   Allergies as of 02/28/2017      Reactions   Vicodin [hydrocodone-acetaminophen] Nausea And Vomiting   Hct [hydrochlorothiazide] Palpitations   Tramadol Palpitations      Medication List    TAKE these medications   amLODipine 5 MG tablet Commonly known as:  NORVASC Take 1 tablet (5 mg total) by mouth daily.   esomeprazole 40 MG  capsule Commonly known as:  NEXIUM Take 1 capsule (40 mg total) by mouth daily at 12 noon.   ibuprofen 200 MG tablet Commonly known as:  ADVIL,MOTRIN Take 800 mg by mouth every 6 (six) hours as needed for headache or mild pain. Reported on 01/06/2016   lisinopril 20 MG tablet Commonly known as:  PRINIVIL,ZESTRIL Take 1 tablet (20 mg total) by mouth daily.   solifenacin 5 MG tablet Commonly known as:  VESICARE Take 5 mg by mouth daily.        DISCHARGE INSTRUCTIONS:   DIET:  Regular diet DISCHARGE CONDITION:  Good ACTIVITY:  Activity as tolerated OXYGEN:  Home Oxygen: No.  Oxygen Delivery: room air DISCHARGE LOCATION:  home   If you experience worsening of your admission symptoms, develop shortness of breath, life threatening emergency, suicidal or homicidal thoughts you must seek medical attention immediately by calling 911 or calling your MD immediately  if symptoms less severe.  You Must read complete instructions/literature along with all the possible adverse reactions/side effects for all the Medicines you take and that have been prescribed to you. Take any new Medicines after you have completely understood and accpet all the possible adverse reactions/side effects.   Please note  You were cared for by a hospitalist during your hospital stay. If you have any questions about your discharge medications or the care you received while you were in the hospital after you are discharged, you can call the unit and asked to speak with the hospitalist on call if the hospitalist that took care of you is not available. Once you are discharged, your primary care  physician will handle any further medical issues. Please note that NO REFILLS for any discharge medications will be authorized once you are discharged, as it is imperative that you return to your primary care physician (or establish a relationship with a primary care physician if you do not have one) for your aftercare needs so  that they can reassess your need for medications and monitor your lab values.    On the day of Discharge:  VITAL SIGNS:  Blood pressure (!) 165/65, pulse 70, temperature 97.7 F (36.5 C), temperature source Oral, resp. rate 18, height 5\' 2"  (1.575 m), weight 72.8 kg (160 lb 8 oz), last menstrual period 08/09/2008, SpO2 100 %. PHYSICAL EXAMINATION:  GENERAL:  56 y.o.-year-old patient lying in the bed with no acute distress.  EYES: Pupils equal, round, reactive to light and accommodation. No scleral icterus. Extraocular muscles intact.  HEENT: Head atraumatic, normocephalic. Oropharynx and nasopharynx clear.  NECK:  Supple, no jugular venous distention. No thyroid enlargement, no tenderness.  LUNGS: Normal breath sounds bilaterally, no wheezing, rales,rhonchi or crepitation. No use of accessory muscles of respiration.  CARDIOVASCULAR: S1, S2 normal. No murmurs, rubs, or gallops.  ABDOMEN: Soft, non-tender, non-distended. Bowel sounds present. No organomegaly or mass.  EXTREMITIES: No pedal edema, cyanosis, or clubbing.  NEUROLOGIC: Cranial nerves II through XII are intact. Muscle strength 5/5 in all extremities. Sensation intact. Gait not checked.  PSYCHIATRIC: The patient is alert and oriented x 3.  SKIN: No obvious rash, lesion, or ulcer.  DATA REVIEW:   CBC  Recent Labs Lab 02/27/17 0305  WBC 10.7  HGB 13.7  HCT 41.1  PLT 282    Chemistries   Recent Labs Lab 02/27/17 0305  NA 142  K 3.6  CL 113*  CO2 25  GLUCOSE 113*  BUN 11  CREATININE 0.66  CALCIUM 10.6*  AST 18  ALT 14  ALKPHOS 64  BILITOT 0.5     Follow-up Information    [facility] Follow up on 03/08/2017.   Specialty:  Primary Care Why:  7 pm Contact information: [address] [place] [id number] [phone]          Management plans discussed with the patient, family and they are in agreement.  CODE STATUS: Prior   TOTAL TIME TAKING  CARE OF THIS PATIENT: 45 minutes.    Delfino LovettVipul Yacine Droz M.D on 03/02/2017 at 6:35 PM  Between 7am to 6pm - Pager - [phone]  After 6pm go to [email or web address] - Social research officer, governmentpassword [email or web address] [email or web address]  [facility] [place] [facility]  Office  [phone]  CC: Primary care physician; Sheila [name], Sheila P, MD   Note: This dictation was prepared with Dragon dictation along with smaller phrase technology. Any transcriptional errors that result from this process are unintentional.

## 2017-03-08 ENCOUNTER — Ambulatory Visit: Payer: Self-pay | Admitting: Adult Health Nurse Practitioner

## 2017-03-08 VITALS — BP 147/94 | HR 88 | Temp 97.7°F | Wt 164.7 lb

## 2017-03-08 DIAGNOSIS — R200 Anesthesia of skin: Secondary | ICD-10-CM

## 2017-03-08 DIAGNOSIS — R42 Dizziness and giddiness: Secondary | ICD-10-CM

## 2017-03-08 DIAGNOSIS — Z720 Tobacco use: Secondary | ICD-10-CM

## 2017-03-08 DIAGNOSIS — K219 Gastro-esophageal reflux disease without esophagitis: Secondary | ICD-10-CM

## 2017-03-08 DIAGNOSIS — R911 Solitary pulmonary nodule: Secondary | ICD-10-CM | POA: Insufficient documentation

## 2017-03-08 DIAGNOSIS — R918 Other nonspecific abnormal finding of lung field: Secondary | ICD-10-CM

## 2017-03-08 NOTE — Progress Notes (Signed)
Subjective:    Patient ID: Sheila [name], female    DOB: 04/14/1961, 56 y.o.   MRN: [id number]  HPI  Pt recent ED visit for chest pain on 7/21. Ruled out cardiac cause. Per discharge summary EKG normal with no ST changes. Enzymes negative x3. Ruled out for PE. Pt reports remaining chest pain, mostly epigastric with some pain under the L breast. Does endorse having severe GERD.  Pt reports anything can trigger heart burn. Pt reports her L side 1/day becomes numb and she slouches over with blurry vision, dizziness. Episode lasts from a few seconds to a few minutes and are random. Began 2 weeks ago. Pt reports reoccurring dizzy spells states she can be sitting still and they occur, no specific trigger identified.  Reports remaining headache.  Pt denies that hospital did not tell her she had a mass in her lower L lobe of lung. Review CT chest results with 13mm mass in the LLL.  Pt had been visiting a nephrologist for chronic UTI.      Patient Active Problem List   Diagnosis Date Noted  . Chest pain 02/26/2017  . Abnormal EKG 02/26/2017  . Hypokalemia 02/26/2017  . Urge incontinence of urine 05/04/2016  . Recurrent UTI 03/02/2016  . COPD (chronic obstructive pulmonary disease) (HCC) 01/06/2016  . Adrenal nodule (HCC) 01/06/2016  . Tobacco abuse 01/06/2016  . Aorto-iliac atherosclerosis (HCC) 01/06/2016  . Vitamin D deficiency 01/06/2016  . Lower abdominal pain 01/06/2016  . Myopia of both eyes 01/06/2016  . Hypercalcemia 01/06/2016  . Essential hypertension 12/11/2015  . Pyelonephritis 12/03/2015  . Infection and inflammatory reaction due to other internal orthopedic device, implant, and graft 02/11/2012   Allergies as of 03/08/2017      Reactions   Vicodin [hydrocodone-acetaminophen] Nausea And Vomiting   Hct [hydrochlorothiazide] Palpitations   Tramadol Palpitations      Medication List       Accurate as of 03/08/17  7:18 PM. Always use your most recent med list.          amLODipine 5 MG tablet Commonly known as:  NORVASC Take 1 tablet (5 mg total) by mouth daily.   esomeprazole 40 MG capsule Commonly known as:  NEXIUM Take 1 capsule (40 mg total) by mouth daily at 12 noon.   ibuprofen 200 MG tablet Commonly known as:  ADVIL,MOTRIN Take 800 mg by mouth every 6 (six) hours as needed for headache or mild pain. Reported on 01/06/2016   lisinopril 20 MG tablet Commonly known as:  PRINIVIL,ZESTRIL Take 1 tablet (20 mg total) by mouth daily.   solifenacin 5 MG tablet Commonly known as:  VESICARE Take 5 mg by mouth daily.         Review of Systems  All other systems reviewed and are negative.      Objective:   Physical Exam  Constitutional: She is oriented to person, place, and time. She appears well-developed and well-nourished.  HENT:  Head: Normocephalic and atraumatic.  Eyes: Pupils are equal, round, and reactive to light. Conjunctivae and EOM are normal.  Cardiovascular: Normal rate, regular rhythm and normal heart sounds.   Pulmonary/Chest: Effort normal and breath sounds normal.  Abdominal: Bowel sounds are normal. There is tenderness.  Neurological: She is alert and oriented to person, place, and time. She has normal reflexes.  Equal strength on both sides. Cranial nerves 2-12 grossly intact.  Skin: Skin is warm and dry.  Vitals reviewed.   BP (!) 147/94  Pulse 88   Temp 97.7 F (36.5 C)   Wt 164 lb 11.2 oz (74.7 kg)   LMP 08/09/2008 (Within Years)   BMI 30.12 kg/m        Assessment & Plan:   Referral to neurology for dizziness/numbness episodes.  F/u CT in 69mo for lung mass.    Counseled pt to quit smoking.  Counseled pt on symptoms of stroke and heart attack and to call 911 if experienced.  Increase Nexium to BID x 14 days for heart burn. Samples given. F/u in 2 mo

## 2017-03-16 ENCOUNTER — Other Ambulatory Visit: Payer: Self-pay

## 2017-03-16 DIAGNOSIS — I10 Essential (primary) hypertension: Secondary | ICD-10-CM

## 2017-03-16 MED ORDER — AMLODIPINE BESYLATE 5 MG PO TABS: 5.0000 mg | ORAL_TABLET | Freq: Every day | ORAL | 0 refills | Status: DC

## 2017-03-16 MED ORDER — LISINOPRIL 20 MG PO TABS: 20.0000 mg | ORAL_TABLET | Freq: Every day | ORAL | 3 refills | Status: DC

## 2017-03-16 MED ORDER — ESOMEPRAZOLE MAGNESIUM 40 MG PO CPDR: 40.0000 mg | DELAYED_RELEASE_CAPSULE | Freq: Every day | ORAL | 3 refills | Status: DC

## 2017-03-24 ENCOUNTER — Telehealth: Payer: Self-pay | Admitting: Pharmacy Technician

## 2017-03-24 NOTE — Telephone Encounter (Signed)
Patient eligible to receive medication assistance at [facility] through 2018, as long as eligibility requirements continue to be met.  Betty J. Kluttz Care Manager [facility] 

## 2017-05-10 ENCOUNTER — Ambulatory Visit: Payer: Self-pay | Admitting: Urology

## 2017-05-10 VITALS — BP 178/96 | HR 82 | Temp 97.8°F | Ht 62.0 in | Wt 170.9 lb

## 2017-05-10 DIAGNOSIS — R918 Other nonspecific abnormal finding of lung field: Secondary | ICD-10-CM

## 2017-05-10 MED ORDER — AMLODIPINE BESYLATE 10 MG PO TABS: 10.0000 mg | ORAL_TABLET | Freq: Every day | ORAL | 0 refills | Status: DC

## 2017-05-10 NOTE — Progress Notes (Signed)
Subjective:    Patient ID: Sheila [name], female    DOB: Jan 26, 1961, 56 y.o.   MRN: [id number]  HPI   Pt is here for 2 mo f/u following hospitalization on 02/26/17.  Pt is due for f/u CT for lung mass. She also needs appt with neurology. She has submitted charity care forms but has not heard if she has it. At last visit on 03/08/17, Teah increased Nexium to . Pt has decreased it back down to .   Patient Active Problem List   Diagnosis Date Noted  . Dizziness 03/08/2017  . Left sided numbness 03/08/2017  . Mass of lower lobe of left lung 03/08/2017  . GERD (gastroesophageal reflux disease) 03/08/2017  . Chest pain 02/26/2017  . Abnormal EKG 02/26/2017  . Hypokalemia 02/26/2017  . Urge incontinence of urine 05/04/2016  . Recurrent UTI 03/02/2016  . COPD (chronic obstructive pulmonary disease) (HCC) 01/06/2016  . Adrenal nodule (HCC) 01/06/2016  . Tobacco abuse 01/06/2016  . Aorto-iliac atherosclerosis (HCC) 01/06/2016  . Vitamin D deficiency 01/06/2016  . Lower abdominal pain 01/06/2016  . Myopia of both eyes 01/06/2016  . Hypercalcemia 01/06/2016  . Essential hypertension 12/11/2015  . Pyelonephritis 12/03/2015  . Infection and inflammatory reaction due to other internal orthopedic device, implant, and graft 02/11/2012   Allergies as of 05/10/2017      Reactions   Vicodin [hydrocodone-acetaminophen] Nausea And Vomiting   Hct [hydrochlorothiazide] Palpitations   Tramadol Palpitations      Medication List       Accurate as of 05/10/17  7:52 PM. Always use your most recent med list.          amLODipine 10 MG tablet Commonly known as:  NORVASC Take 1 tablet (10 mg total) by mouth daily.   esomeprazole 40 MG capsule Commonly known as:  NEXIUM Take 1 capsule (40 mg total) by mouth daily at 12 noon.   ibuprofen 200 MG tablet Commonly known as:  ADVIL,MOTRIN Take 800 mg by mouth every 6 (six) hours as needed for headache or mild pain. Reported on 01/06/2016    lisinopril 20 MG tablet Commonly known as:  PRINIVIL,ZESTRIL Take 1 tablet (20 mg total) by mouth daily.   solifenacin 5 MG tablet Commonly known as:  VESICARE Take 5 mg by mouth daily.        Review of Systems  Pt endorses SOB everyday for months. Pt has occasional cough with flem. Pt has rescue inhaler.  Pt reports  of Nexium slightly helped with chest pain. Pt is still smoking.  Pt reports she is still experiencing dizzy spells, numbness on L side with blurry vision. The numbness now sometimes happen on the R side.  Pt endorses she is taking Lisinopril and Amlodipine as prescribed. BP remains elevated. Pt endorses her BP is usually elevated. Pt reports she does moderate exercise cleaning her house and watching her grandchildren. Pt has palpitations with HCTZ Pt reports her diet is low in sodium and she avoids red meats but she will eat anything else.     Objective:   Physical Exam  Constitutional: She appears well-developed and well-nourished.  HENT:  Head: Atraumatic.  Eyes: Conjunctivae are normal.  Cardiovascular: Normal rate and regular rhythm.   Pulmonary/Chest: Effort normal and breath sounds normal.  Skin: Skin is warm and dry.    BP (!) 178/96 (BP Location: Left Arm, Patient Position: Sitting, Cuff Size: Normal)   Pulse 82   Temp 97.8 F (36.6 C)   Ht   (1.575 m)   Wt 170 lb 14.4 oz (77.5 kg)   LMP 08/09/2008 (Within Years)   BMI 31.26 kg/m  Constitutional: Well nourished. Alert and oriented, No acute distress. HEENT: [place] AT, moist mucus membranes. Trachea midline, no masses. Cardiovascular: No clubbing, cyanosis, or edema. Respiratory: Normal respiratory effort, no increased work of breathing. Skin: No rashes, bruises or suspicious lesions. Lymph: No cervical or inguinal adenopathy. Neurologic: Grossly intact, no focal deficits, moving all 4 extremities. Psychiatric: Normal mood and affect.       Assessment & Plan:   F/u in 2 weeks for BP  check and  Referral to Dr. Justice Rocher for ortho eval of L side numbness.  Pt needs her charity care to schedule her chest CT and neurology appt.  Increase Amlodipine to .  Gave pt samples of pulmicort.  Referral to [facility] for mammogram.

## 2017-05-10 NOTE — Patient Instructions (Signed)
Use 2 puffs of Pulmicort twice a day.

## 2017-05-11 ENCOUNTER — Encounter: Payer: Self-pay | Admitting: Specialist

## 2017-05-26 ENCOUNTER — Ambulatory Visit: Payer: Self-pay | Admitting: Adult Health Nurse Practitioner

## 2017-05-26 VITALS — BP 175/110 | HR 97 | Temp 97.9°F | Ht 62.0 in | Wt 169.4 lb

## 2017-05-26 DIAGNOSIS — I10 Essential (primary) hypertension: Secondary | ICD-10-CM

## 2017-05-26 DIAGNOSIS — R42 Dizziness and giddiness: Secondary | ICD-10-CM

## 2017-05-26 DIAGNOSIS — R911 Solitary pulmonary nodule: Secondary | ICD-10-CM

## 2017-05-26 DIAGNOSIS — R918 Other nonspecific abnormal finding of lung field: Secondary | ICD-10-CM

## 2017-05-26 MED ORDER — LISINOPRIL 40 MG PO TABS: 20.0000 mg | ORAL_TABLET | Freq: Every day | ORAL | 4 refills | Status: DC

## 2017-05-26 NOTE — Progress Notes (Signed)
   Subjective:    Patient ID: Sheila [name], female    DOB: 07/20/1961, 56 y.o.   MRN: [id number]  HPI   Pt is here for a BP check.  Pt is waiting for chest CT appt [facility] She is also waiting for a referral to a neurologist for her reoccurring numbness episodes.   Patient Active Problem List   Diagnosis Date Noted  . Dizziness 03/08/2017  . Left sided numbness 03/08/2017  . Mass of lower lobe of left lung 03/08/2017  . GERD (gastroesophageal reflux disease) 03/08/2017  . Chest pain 02/26/2017  . Abnormal EKG 02/26/2017  . Hypokalemia 02/26/2017  . Urge incontinence of urine 05/04/2016  . Recurrent UTI 03/02/2016  . COPD (chronic obstructive pulmonary disease) (HCC) 01/06/2016  . Adrenal nodule (HCC) 01/06/2016  . Tobacco abuse 01/06/2016  . Aorto-iliac atherosclerosis (HCC) 01/06/2016  . Vitamin D deficiency 01/06/2016  . Lower abdominal pain 01/06/2016  . Myopia of both eyes 01/06/2016  . Hypercalcemia 01/06/2016  . Essential hypertension 12/11/2015  . Pyelonephritis 12/03/2015  . Infection and inflammatory reaction due to other internal orthopedic device, implant, and graft 02/11/2012   Allergies as of 05/26/2017      Reactions   Vicodin [hydrocodone-acetaminophen] Nausea And Vomiting   Hct [hydrochlorothiazide] Palpitations   Tramadol Palpitations      Medication List       Accurate as of 05/26/17  7:07 PM. Always use your most recent med list.          amLODipine 10 MG tablet Commonly known as:  NORVASC Take 1 tablet (10 mg total) by mouth daily.   esomeprazole 40 MG capsule Commonly known as:  NEXIUM Take 1 capsule (40 mg total) by mouth daily at 12 noon.   ibuprofen 200 MG tablet Commonly known as:  ADVIL,MOTRIN Take 800 mg by mouth every 6 (six) hours as needed for headache or mild pain. Reported on 01/06/2016   lisinopril 20 MG tablet Commonly known as:  PRINIVIL,ZESTRIL Take 1 tablet (20 mg total) by mouth daily.    solifenacin 5 MG tablet Commonly known as:  VESICARE Take 5 mg by mouth daily.        Review of Systems  Pt endorses she has been taking Amlodipine and Lisinopril as directed. BP remains elevated.  Pt reports she continues to have her numbness episodes on her L side.     Objective:   Physical Exam  Constitutional: She is oriented to person, place, and time. She appears well-developed and well-nourished.  Cardiovascular: Normal rate, regular rhythm and normal heart sounds.   Pulmonary/Chest: Breath sounds normal. She is in respiratory distress.  Abdominal: Soft. Bowel sounds are normal.  Neurological: She is alert and oriented to person, place, and time.  No Nystagmus. Cranial nerves 1-12 intact.   Skin: Skin is warm and dry.    BP (!) 175/110 (BP Location: Left Arm)   Pulse 97   Temp 97.9 F (36.6 C)   Ht 5\' 2"  (1.575 m)   Wt 169 lb 6.4 oz (76.8 kg)   LMP 08/09/2008 (Within Years)   BMI 30.98 kg/m        Assessment & Plan:   Reordered Chest CT to add contrast for pulmonary nodule FU.  Neurology referral for dizziness.  Increase Lisinopril to 40 mg for BP control.  FU in 3 weeks for BP check and BMP.

## 2017-06-16 ENCOUNTER — Other Ambulatory Visit: Payer: Self-pay | Admitting: Urology

## 2017-06-16 ENCOUNTER — Ambulatory Visit: Payer: Self-pay | Admitting: Urology

## 2017-06-16 VITALS — BP 147/90 | HR 72 | Temp 97.3°F | Ht 62.0 in | Wt 170.4 lb

## 2017-06-16 DIAGNOSIS — I10 Essential (primary) hypertension: Secondary | ICD-10-CM

## 2017-06-16 MED ORDER — LISINOPRIL 40 MG PO TABS: 20.0000 mg | ORAL_TABLET | Freq: Every day | ORAL | 4 refills | Status: DC

## 2017-06-16 NOTE — Progress Notes (Signed)
Subjective:    Patient ID: Sheila [name], female    DOB: 08/13/1960, 56 y.o.   MRN: [id number]  HPI   Sheila [name] is 56 yo female here for a BP check and BMP. Today her BP is 147/90. Last visit it was 175/110. Lisinopril was increased to 40mg  at last visit.  Pt has her chest CT scheduled for next Thurs and appt with Neurology in Dec.  Pt reports she is under stress.   Patient Active Problem List   Diagnosis Date Noted  . Dizziness 03/08/2017  . Left sided numbness 03/08/2017  . Solitary pulmonary nodule 03/08/2017  . GERD (gastroesophageal reflux disease) 03/08/2017  . Chest pain 02/26/2017  . Abnormal EKG 02/26/2017  . Hypokalemia 02/26/2017  . Urge incontinence of urine 05/04/2016  . Recurrent UTI 03/02/2016  . COPD (chronic obstructive pulmonary disease) (HCC) 01/06/2016  . Adrenal nodule (HCC) 01/06/2016  . Tobacco abuse 01/06/2016  . Aorto-iliac atherosclerosis (HCC) 01/06/2016  . Vitamin D deficiency 01/06/2016  . Lower abdominal pain 01/06/2016  . Myopia of both eyes 01/06/2016  . Hypercalcemia 01/06/2016  . Essential hypertension 12/11/2015  . Pyelonephritis 12/03/2015  . Infection and inflammatory reaction due to other internal orthopedic device, implant, and graft 02/11/2012   Allergies as of 06/16/2017      Reactions   Vicodin [hydrocodone-acetaminophen] Nausea And Vomiting   Hct [hydrochlorothiazide] Palpitations   Tramadol Palpitations      Medication List        Accurate as of 06/16/17  6:54 PM. Always use your most recent med list.          amLODipine 10 MG tablet Commonly known as:  NORVASC Take 1 tablet (10 mg total) by mouth daily.   esomeprazole 40 MG capsule Commonly known as:  NEXIUM Take 1 capsule (40 mg total) by mouth daily at 12 noon.   ibuprofen 200 MG tablet Commonly known as:  ADVIL,MOTRIN Take 800 mg by mouth every 6 (six) hours as needed for headache or mild pain. Reported on 01/06/2016   lisinopril 40 MG tablet Commonly  known as:  PRINIVIL,ZESTRIL Take 0.5 tablets (20 mg total) by mouth daily.   solifenacin 5 MG tablet Commonly known as:  VESICARE Take 5 mg by mouth daily.        Review of Systems BP - has decreased from last visit.  Pt reports after eating, her stomach hurts 30 min-1 hour after and has had diarrhea the next morning. Pain starts on the left side and moves up into her stomach up to an hour. She reports she takes her Nexium and Lisinopril together at night. Pt denies blood in stool.     Objective:   Physical Exam  BP (!) 147/90 (BP Location: Left Arm, Patient Position: Sitting, Cuff Size: Large)   Pulse 72   Temp (!) 97.3 F (36.3 C)   Ht 5\' 2"  (1.575 m)   Wt 170 lb 6.4 oz (77.3 kg)   LMP 08/09/2008 (Within Years)   BMI 31.17 kg/m  Constitutional: Well nourished. Alert and oriented, No acute distress. HEENT: [place] AT, moist mucus membranes. Trachea midline, no masses. Cardiovascular: No clubbing, cyanosis, or edema. Respiratory: Normal respiratory effort, no increased work of breathing. Skin: No rashes, bruises or suspicious lesions. Lymph: No cervical or inguinal adenopathy. Neurologic: Grossly intact, no focal deficits, moving all 4 extremities. Psychiatric: Normal mood and affect.       Assessment & Plan:   Keep CT and neurology appts. Continue meds but  advised pt to take Nexium at least an hour before taking Lisinopril.  Encouraged Pt to see mental health counselor but pt declined.

## 2017-06-17 LAB — BASIC METABOLIC PANEL
BUN/Creatinine Ratio: 12 (ref 9–23)
BUN: 8 mg/dL (ref 6–24)
CALCIUM: 11.1 mg/dL — AB (ref 8.7–10.2)
CO2: 21 mmol/L (ref 20–29)
CREATININE: 0.66 mg/dL (ref 0.57–1.00)
Chloride: 108 mmol/L — ABNORMAL HIGH (ref 96–106)
GFR, EST AFRICAN AMERICAN: 113 mL/min/{1.73_m2} (ref >59)
GFR, EST NON AFRICAN AMERICAN: 98 mL/min/{1.73_m2} (ref >59)
Glucose: 106 mg/dL — ABNORMAL HIGH (ref 65–99)
Potassium: 3.8 mmol/L (ref 3.5–5.2)
Sodium: 144 mmol/L (ref 134–144)

## 2017-06-23 ENCOUNTER — Ambulatory Visit
Admission: RE | Admit: 2017-06-23 | Discharge: 2017-06-23 | Disposition: A | Discharge status: Home / Self Care | Payer: Self-pay | Source: Ambulatory Visit | Attending: Adult Health Nurse Practitioner | Admitting: Adult Health Nurse Practitioner

## 2017-06-23 DIAGNOSIS — I313 Pericardial effusion (noninflammatory): Secondary | ICD-10-CM | POA: Insufficient documentation

## 2017-06-23 DIAGNOSIS — J439 Emphysema, unspecified: Secondary | ICD-10-CM | POA: Insufficient documentation

## 2017-06-23 DIAGNOSIS — I700 Atherosclerosis of aorta: Secondary | ICD-10-CM | POA: Insufficient documentation

## 2017-06-23 DIAGNOSIS — R911 Solitary pulmonary nodule: Secondary | ICD-10-CM | POA: Insufficient documentation

## 2017-06-23 MED ORDER — IOPAMIDOL (ISOVUE-300) INJECTION 61%
75.0000 mL | Freq: Once | INTRAVENOUS | Status: AC | PRN
  Administered 2017-06-23: 75 mL via INTRAVENOUS

## 2017-07-26 ENCOUNTER — Ambulatory Visit (INDEPENDENT_AMBULATORY_CARE_PROVIDER_SITE_OTHER): Payer: Self-pay | Admitting: Neurology

## 2017-07-26 ENCOUNTER — Encounter: Payer: Self-pay | Admitting: Neurology

## 2017-07-26 VITALS — BP 193/102 | HR 77 | Ht 62.0 in | Wt 163.0 lb

## 2017-07-26 DIAGNOSIS — R42 Dizziness and giddiness: Secondary | ICD-10-CM

## 2017-07-26 DIAGNOSIS — G3281 Cerebellar ataxia in diseases classified elsewhere: Secondary | ICD-10-CM

## 2017-07-26 NOTE — Progress Notes (Signed)
Subjective:    Patient ID: Sheila [name] is a 56 y.o. female.  HPI     [name], MD, PhD [facility] [address], [address] [address] [place], [place] [id number]  Dear [name],   I saw your patient, Sheila [name], upon your kind request in my neurologic clinic today for initial consultation of her dizziness. The patient is accompanied by her daughter today. As you know, [name] is a 56 year old right-handed woman with an underlying medical history of hypertension, smoking, COPD, vitamin D deficiency, and obesity, who reports recurrent episodes of feeling dizzy, veering to one side. Thankfully she has not fallen recently, but does report a fall in the past some years ago and hitting her head at the time. Of note, she had a head CT without contrast on 02/26/2017 which I reviewed: IMPRESSION: No acute finding or explanation for the history. She had presented to the emergency room on 02/26/2017 for chest pain and shortness of breath. Reported a recurrent headache and also feeling off balance. She was admitted to the hospital and discharged on 02/28/2017 with negative cardiac workup. Her blood pressure is consistently elevated today. She did not have any orthostatic symptoms blood pressure was nearly 200s over 100s. She does not take her blood pressure medication on the morning. She takes it at 6 PM in the evening because she feels sedated after the medication. She has had some elevated blood pressure values in the recent few months upon chart review.  She feels like she is pulled to the right. Back in July she felt she was pulling to the left.  She has had double vision about 1 week ago.  She has reduced her smoking from 1 ppd to 1/2 ppd. She tries to hydrate well with water. She drinks caffeine in the form of diet [facility], about 3 bottles per day. She does not drink alcohol. She has 3 children. She lives with her boyfriend. She denies any recent headache. She has  not had any one-sided weakness or numbness or slurring of speech or droopy face. She has not seen her eye doctor recently. Last eye check was may be a year ago or so.  Her Past Medical History Is Significant For: Past Medical History:  Diagnosis Date  . Aorto-iliac atherosclerosis (HCC) 01/06/2016  . Asthma   . COPD (chronic obstructive pulmonary disease) (HCC)   . Hypertension   . Tobacco abuse 01/06/2016  . Vitamin D deficiency 01/06/2016    Her Past Surgical History Is Significant For: Past Surgical History:  Procedure Laterality Date  . ANKLE FRACTURE SURGERY Left 11/07/2009  . left ankle hardware removal    . left ankle surgery      Her Family History Is Significant For: Family History  Problem Relation Age of Onset  . COPD Mother   . Heart failure Mother   . COPD Father   . Heart failure Father     Her Social History Is Significant For: Social History   Socioeconomic History  . Marital status: Divorced    Spouse name: None  . Number of children: None  . Years of education: None  . Highest education level: None  Social Needs  . Financial resource strain: None  . Food insecurity - worry: None  . Food insecurity - inability: None  . Transportation needs - medical: None  . Transportation needs - non-medical: None  Occupational History  . None  Tobacco Use  . Smoking status: Current Every Day  Smoker    Packs/day: 1.00    Years: 30.00    Pack years: 30.00    Types: Cigarettes  . Smokeless tobacco: Never Used  Substance and Sexual Activity  . Alcohol use: No    Alcohol/week: 0.0 oz  . Drug use: No  . Sexual activity: None  Other Topics Concern  . None  Social History Narrative  . None    Her Allergies Are:  Allergies  Allergen Reactions  . Vicodin [Hydrocodone-Acetaminophen] Nausea And Vomiting  . Hct [Hydrochlorothiazide] Palpitations  . Tramadol Palpitations  :   Her Current Medications Are:  Outpatient Encounter Medications as of 07/26/2017   Medication Sig  . amLODipine (NORVASC) 10 MG tablet Take 1 tablet (10 mg total) by mouth daily.  Marland Kitchen. esomeprazole (NEXIUM) 40 MG capsule Take 1 capsule (40 mg total) by mouth daily at 12 noon.  Marland Kitchen. ibuprofen (ADVIL,MOTRIN) 200 MG tablet Take 800 mg by mouth every 6 (six) hours as needed for headache or mild pain. Reported on 01/06/2016  . lisinopril (PRINIVIL,ZESTRIL) 40 MG tablet Take 0.5 tablets (20 mg total) daily by mouth.  . [DISCONTINUED] solifenacin (VESICARE) 5 MG tablet Take 5 mg by mouth daily.   No facility-administered encounter medications on file as of 07/26/2017.   : Review of Systems:  Out of a complete 14 point review of systems, all are reviewed and negative with the exception of these symptoms as listed below:  Review of Systems  Neurological:       Pt presents today to discuss her dizzy spells. Several times daily, pt feels that her whole body will start veering. Pt does not like to take her blood pressure medication in the morning because it makes her sleepy and "swimmy headed."    Objective:  Neurological Exam  Physical Exam Physical Examination:   Vitals:   07/26/17 0951  BP: (!) 193/102  Pulse: 77   General Examination: The patient is a very pleasant 56 y.o. female in no acute distress. She appears well-developed and well-nourished and adequately groomed. She has no orthostatic blood pressure drop. She has no orthostatic symptoms but blood pressure is consistently high. She has no vertiginous symptoms.  HEENT: Normocephalic, atraumatic, pupils are equal, round and reactive to light and accommodation. She wears corrective eyeglasses. Extraocular tracking is good, no nystagmus. Face is symmetric, no facial masking. Speech is edentulous otherwise no dysarthria. She has no hypophonia. Hearing is grossly intact. Airway examination reveals moderate mouth dryness and larger uvula. Otherwise benign findings. Tongue protrudes centrally and palate elevates symmetrically. She  is edentulous. No vertigo upon sudden changes of head position.  Chest: Clear to auscultation without wheezing, rhonchi or crackles noted.  Heart: S1+S2+0, regular and normal without murmurs, rubs or gallops noted.   Abdomen: Soft, non-tender and non-distended with normal bowel sounds appreciated on auscultation.  Extremities: There is no pitting edema, left leg is slightly larger in caliber in the distal lower extremity. She says that she fractured her leg before.  Skin: Warm and dry without trophic changes noted.  Musculoskeletal: exam reveals no obvious joint deformities, tenderness or joint swelling or erythema.   Neurologically:  Mental status: The patient is awake, alert and oriented in all 4 spheres. Her immediate and remote memory, attention, language skills and fund of knowledge are appropriate. There is no evidence of aphasia, agnosia, apraxia or anomia. Speech is clear with normal prosody and enunciation. Thought process is linear. Mood is normal and affect is normal.  Cranial nerves II -  XII are as described above under HEENT exam. In addition: shoulder shrug is normal with equal shoulder height noted. Motor exam: Normal bulk, strength and tone is noted. There is no drift, tremor or rebound. Romberg is negative. Reflexes are 2+ throughout. Fine motor skills and coordination: intact with normal finger taps, normal hand movements, normal rapid alternating patting, normal foot taps and normal foot agility.  Cerebellar testing: No dysmetria or intention tremor on finger to nose testing. Heel to shin is unremarkable bilaterally. There is no truncal or gait ataxia.  Sensory exam: intact to light touch in the upper and lower extremities.  Gait, station and balance: She stands easily. No veering to one side is noted. No leaning to one side is noted. Posture is age-appropriate and stance is narrow based. Gait shows normal stride length and normal pace. No problems turning are noted. Tandem  walk is unremarkable.   Assessment and Plan:  In summary, CALVINA LIPTAK is a very pleasant 56 y.o.-year old female with an underlying medical history of hypertension, smoking, COPD, vitamin D deficiency, and obesity, who  presents for neurologic consultation of her intermittent dizzy spells. Neurologically, she has a nonfocal exam and is reassured in that regard. I do worry about her blood pressure being rather high. She has had in the past few months rising blood pressure values. This could be in part the cause for her dizzy spells I explained to her. She is advised to talk to about blood pressure management. Furthermore, she is advised to continue to work on smoking cessation and stay well-hydrated with water. She is advised to reduce her caffeine intake in the form of soda as caffeine can further drive her blood pressure up. She has no evidence of orthostatic hypotension or vertigo or ataxia at this time. We will proceed with a brain MRI without contrast. So long as the brain MRI is age-appropriate or nonrevealing I can see her back on an as-needed basis. I answered all their questions today and the patient and her daughter were in agreement.  Thank you very much for allowing me to participate in the care of this nice patient. If I can be of any further assistance to you please do not hesitate to call me at [phone].  Sincerely,   Huston Foley, MD, PhD

## 2017-07-26 NOTE — Patient Instructions (Addendum)
Thankfully your neurological exam is good today.  I do worry about your blood pressure being high, this can cause dizziness. Please talk to your primary care about your blood pressure medication.  Please reduce your soda intake, as the caffeine can increase blood pressure.  Please continue to work on quitting cigarettes, good job with reducing your cigarettes.  We will do a brain scan, called MRI and call you with the test results. We will have to schedule you for this on a separate date. This test requires authorization from your insurance, and we will take care of the insurance process. So long as this is normal or age appropriate, we can see you back as needed.

## 2017-08-03 ENCOUNTER — Ambulatory Visit
Admission: RE | Admit: 2017-08-03 | Discharge: 2017-08-03 | Disposition: A | Discharge status: Home / Self Care | Payer: Self-pay | Source: Ambulatory Visit | Attending: Neurology | Admitting: Neurology

## 2017-08-03 DIAGNOSIS — R42 Dizziness and giddiness: Secondary | ICD-10-CM | POA: Insufficient documentation

## 2017-08-03 DIAGNOSIS — R9082 White matter disease, unspecified: Secondary | ICD-10-CM | POA: Insufficient documentation

## 2017-08-04 ENCOUNTER — Telehealth: Payer: Self-pay

## 2017-08-04 NOTE — Telephone Encounter (Signed)
-----   Message from Anson FretAntonia B Ahern, MD sent at 08/04/2017 11:55 AM EST ----- MRi brain normal for age thanks

## 2017-08-04 NOTE — Telephone Encounter (Signed)
I called pt, advised her that her MRI brain was normal for age. Pt verbalized understanding of results. Pt had no questions at this time but was encouraged to call back if questions arise.

## 2017-08-11 ENCOUNTER — Ambulatory Visit: Payer: Self-pay | Admitting: Adult Health Nurse Practitioner

## 2017-08-11 VITALS — BP 152/90 | HR 82 | Temp 97.5°F | Wt 165.8 lb

## 2017-08-11 DIAGNOSIS — I771 Stricture of artery: Secondary | ICD-10-CM | POA: Insufficient documentation

## 2017-08-11 DIAGNOSIS — I10 Essential (primary) hypertension: Secondary | ICD-10-CM

## 2017-08-11 NOTE — Progress Notes (Signed)
   Subjective:    Patient ID: Sheila [name], female    DOB: 03/17/1961, 57 y.o.   MRN: [id number]  HPI   Sheila [name] is a 57 yo female here for f/u of test results and hypertension.  Pt reports her ribs on her R side hurt. Pt reports coughing. She is taking her inhaler.    Patient Active Problem List   Diagnosis Date Noted  . Dizziness 03/08/2017  . Left sided numbness 03/08/2017  . Solitary pulmonary nodule 03/08/2017  . GERD (gastroesophageal reflux disease) 03/08/2017  . Chest pain 02/26/2017  . Abnormal EKG 02/26/2017  . Hypokalemia 02/26/2017  . Urge incontinence of urine 05/04/2016  . Recurrent UTI 03/02/2016  . COPD (chronic obstructive pulmonary disease) (HCC) 01/06/2016  . Adrenal nodule (HCC) 01/06/2016  . Tobacco abuse 01/06/2016  . Aorto-iliac atherosclerosis (HCC) 01/06/2016  . Vitamin D deficiency 01/06/2016  . Lower abdominal pain 01/06/2016  . Myopia of both eyes 01/06/2016  . Hypercalcemia 01/06/2016  . Essential hypertension 12/11/2015  . Pyelonephritis 12/03/2015  . Infection and inflammatory reaction due to other internal orthopedic device, implant, and graft 02/11/2012   Allergies as of 08/11/2017      Reactions   Vicodin [hydrocodone-acetaminophen] Nausea And Vomiting   Hct [hydrochlorothiazide] Palpitations   Tramadol Palpitations      Medication List        Accurate as of 08/11/17  7:48 PM. Always use your most recent med list.          amLODipine 10 MG tablet Commonly known as:  NORVASC Take 1 tablet (10 mg total) by mouth daily.   esomeprazole 40 MG capsule Commonly known as:  NEXIUM Take 1 capsule (40 mg total) by mouth daily at 12 noon.   ibuprofen 200 MG tablet Commonly known as:  ADVIL,MOTRIN Take 800 mg by mouth every 6 (six) hours as needed for headache or mild pain. Reported on 01/06/2016   lisinopril 40 MG tablet Commonly known as:  PRINIVIL,ZESTRIL Take 0.5 tablets (20 mg total) daily by mouth.         Review of  Systems Pulmonary Nodule has no change. F/u CT in 3 years Test results show Stenosis of L subclavian artery Neurology - pt had MRI on 12/26 and reports it was normal.     Objective:   Physical Exam  Constitutional: She is oriented to person, place, and time. She appears well-developed and well-nourished.  Cardiovascular: Normal rate, regular rhythm and normal heart sounds.  Pulmonary/Chest: Effort normal. She has decreased breath sounds.  Neurological: She is alert and oriented to person, place, and time.  Skin: Skin is warm and dry.    BP (!) 153/97 (BP Location: Left Arm, Patient Position: Sitting, Cuff Size: Normal)   Pulse 82   Temp (!) 97.5 F (36.4 C) (Oral)   Wt 165 lb 12.8 oz (75.2 kg)   LMP 08/09/2008 (Within Years)   BMI 30.33 kg/m   Recheck BP on R arm 152/90     Assessment & Plan:   Referral for vascular clinic for stenosis.  Supportive care.  F/u in 6 weeks. Adjust BP medications at that time.

## 2017-08-30 ENCOUNTER — Encounter (INDEPENDENT_AMBULATORY_CARE_PROVIDER_SITE_OTHER): Payer: Self-pay | Admitting: Vascular Surgery

## 2017-08-30 ENCOUNTER — Ambulatory Visit (INDEPENDENT_AMBULATORY_CARE_PROVIDER_SITE_OTHER): Payer: Self-pay | Admitting: Vascular Surgery

## 2017-08-30 VITALS — BP 191/105 | HR 82 | Resp 17 | Ht 62.0 in | Wt 164.0 lb

## 2017-08-30 DIAGNOSIS — R42 Dizziness and giddiness: Secondary | ICD-10-CM

## 2017-08-30 DIAGNOSIS — I10 Essential (primary) hypertension: Secondary | ICD-10-CM

## 2017-08-30 DIAGNOSIS — Z720 Tobacco use: Secondary | ICD-10-CM

## 2017-08-30 DIAGNOSIS — I771 Stricture of artery: Secondary | ICD-10-CM

## 2017-08-30 NOTE — Progress Notes (Signed)
Subjective:    Patient ID: Sheila [name], female    DOB: 06/19/1961, 57 y.o.   MRN: [id number] Chief Complaint  Patient presents with  . New Patient (Initial Visit)    Subclavian stenosis   Presents as a new patient referred by nurse practitioner Laural BenesJohnson for a variety of vascular issues.  The patient endorses experiencing "dizziness" for the last few months.  The patient states that she will stand then fall towards the left which is then followed by chest pain.  This has been happening intermittently over the last few months.  The patient is also experiencing "blurred vision".  The patient states that this happens frequently again over the last few months.  The patient has not been to an eye doctor in over a year.  The patient also experiences left upper extremity hand tingling and pain.  She states that this pain has progressively worsened over the last year.  Patient denies any ulceration to the left hand. The patient denies experiencing Amaurosis Fugax, TIA like symptoms or focal motor deficits.    Review of Systems  Constitutional: Negative.   HENT: Negative.   Eyes: Negative.   Respiratory: Negative.   Cardiovascular:       Left upper extremity pain  Gastrointestinal: Negative.   Endocrine: Negative.   Genitourinary: Negative.   Musculoskeletal: Negative.   Skin: Negative.   Neurological: Positive for dizziness.       Blurred vision  Hematological: Negative.   Psychiatric/Behavioral: Negative.       Objective:   Physical Exam  Constitutional: She is oriented to person, place, and time. She appears well-developed and well-nourished. No distress.  HENT:  Head: Normocephalic and atraumatic.  Eyes: Conjunctivae are normal. Pupils are equal, round, and reactive to light.  Neck: Normal range of motion.  Cardiovascular: Normal rate, regular rhythm, normal heart sounds and intact distal pulses.  Pulses:      Radial pulses are 2+ on the right side, and 2+ on the left side.    Pulmonary/Chest: Effort normal and breath sounds normal.  Musculoskeletal: Normal range of motion. She exhibits no edema.  Neurological: She is alert and oriented to person, place, and time.  Skin: Skin is warm and dry. She is not diaphoretic.  Psychiatric: She has a normal mood and affect. Her behavior is normal. Judgment and thought content normal.  Vitals reviewed.  BP (!) 191/105 (BP Location: Right Arm, Patient Position: Sitting)   Pulse 82   Resp 17   Ht 5\' 2"  (1.575 m)   Wt 164 lb (74.4 kg)   LMP 08/09/2008 (Within Years)   BMI 30.00 kg/m   Past Medical History:  Diagnosis Date  . Aorto-iliac atherosclerosis (HCC) 01/06/2016  . Asthma   . COPD (chronic obstructive pulmonary disease) (HCC)   . Hypertension   . Tobacco abuse 01/06/2016  . Vitamin D deficiency 01/06/2016   Social History   Socioeconomic History  . Marital status: Divorced    Spouse name: Not on file  . Number of children: Not on file  . Years of education: Not on file  . Highest education level: Not on file  Social Needs  . Financial resource strain: Not on file  . Food insecurity - worry: Not on file  . Food insecurity - inability: Not on file  . Transportation needs - medical: Not on file  . Transportation needs - non-medical: Not on file  Occupational History  . Not on file  Tobacco Use  . Smoking  status: Current Every Day Smoker    Packs/day: 1.00    Years: 30.00    Pack years: 30.00    Types: Cigarettes  . Smokeless tobacco: Never Used  Substance and Sexual Activity  . Alcohol use: No    Alcohol/week: 0.0 oz  . Drug use: No  . Sexual activity: Not on file  Other Topics Concern  . Not on file  Social History Narrative  . Not on file   Past Surgical History:  Procedure Laterality Date  . ANKLE FRACTURE SURGERY Left 11/07/2009  . left ankle hardware removal    . left ankle surgery     Family History  Problem Relation Age of Onset  . COPD Mother   . Heart failure Mother   . COPD  Father   . Heart failure Father    Allergies  Allergen Reactions  . Vicodin [Hydrocodone-Acetaminophen] Nausea And Vomiting  . Hct [Hydrochlorothiazide] Palpitations  . Tramadol Palpitations      Assessment & Plan:  Presents as a new patient referred by nurse practitioner Laural Benes for a variety of vascular issues.  The patient endorses experiencing "dizziness" for the last few months.  The patient states that she will stand then fall towards the left which is then followed by chest pain.  This has been happening intermittently over the last few months.  The patient is also experiencing "blurred vision".  The patient states that this happens frequently again over the last few months.  The patient has not been to an eye doctor in over a year.  The patient also experiences left upper extremity hand tingling and pain.  She states that this pain has progressively worsened over the last year.  Patient denies any ulceration to the left hand. The patient denies experiencing Amaurosis Fugax, TIA like symptoms or focal motor deficits.   1. Subclavian artery stenosis (HCC) - New CT of the chest conducted on June 23, 2017 for evaluation of a pulmonary nodule was notable for "stenosis involving the proximal left subclavian artery hemodynamically significant. Does the patient have symptoms involving the left upper extremity or symptoms related to subclavian steal" The patient has 2+ radial and ulnar pulses on physical exam The patient is complaining of progressively worsening left upper extremity pain.  No ulceration to the left hand. I will bring the patient back to undergo a left upper extremity ultrasound however she understands that a left upper extremity angiogram may be likely in the future.  - VAS [place] UPPER EXTREMITY ARTERIAL DUPLEX; Future  2. Dizziness - New The patient has been experiencing dizziness and falling towards the left side over the last couple months Patient has not undergone a  ultrasound of her carotid arteries I will bring her back for this to assess for any contributing cure carotid artery disease  - VAS US CAROTID; Future  3. Tobacco abuse - Stable We had a discussion for approximately 10 minutes regarding the absolute need for smoking cessation due to the deleterious nature of tobacco on the vascular system. We discussed the tobacco use would diminish patency of any intervention, and likely significantly worsen progressio of disease. We discussed multiple agents for quitting including replacement therapy or medications to reduce cravings such as Chantix. The patient voices their understanding of the importance of smoking cessation.  4. Essential hypertension - Stable Encouraged good control as its slows the progression of atherosclerotic disease  Current Outpatient Medications on File Prior to Visit  Medication Sig Dispense Refill  . amLODipine (NORVASC)  10 MG tablet Take 1 tablet (10 mg total) by mouth daily. 90 tablet 0  . esomeprazole (NEXIUM) 40 MG capsule Take 1 capsule (40 mg total) by mouth daily at 12 noon. 90 capsule 3  . ibuprofen (ADVIL,MOTRIN) 200 MG tablet Take 800 mg by mouth every 6 (six) hours as needed for headache or mild pain. Reported on 01/06/2016    . lisinopril (PRINIVIL,ZESTRIL) 40 MG tablet Take 0.5 tablets (20 mg total) daily by mouth. 90 tablet 4  . aspirin EC 81 MG tablet Take by mouth.     No current facility-administered medications on file prior to visit.    There are no Patient Instructions on file for this visit. No Follow-up on file.  Alliene Klugh A Burke Terry, PA-C

## 2017-09-12 ENCOUNTER — Other Ambulatory Visit (INDEPENDENT_AMBULATORY_CARE_PROVIDER_SITE_OTHER): Payer: Self-pay | Admitting: *Deleted

## 2017-09-22 ENCOUNTER — Ambulatory Visit: Payer: Self-pay | Admitting: Urology

## 2017-09-22 DIAGNOSIS — I10 Essential (primary) hypertension: Secondary | ICD-10-CM

## 2017-09-22 MED ORDER — CARVEDILOL 12.5 MG PO TABS: 12.5000 mg | ORAL_TABLET | Freq: Two times a day (BID) | ORAL | 3 refills | Status: DC

## 2017-09-22 MED ORDER — AMLODIPINE BESYLATE 10 MG PO TABS: 10.0000 mg | ORAL_TABLET | Freq: Every day | ORAL | 3 refills | Status: DC

## 2017-09-22 MED ORDER — LISINOPRIL 40 MG PO TABS: 40.0000 mg | ORAL_TABLET | Freq: Every day | ORAL | 4 refills | Status: DC

## 2017-09-22 NOTE — Progress Notes (Signed)
   Subjective:    Patient ID: Sheila [name], female    DOB: 01/08/1961, 57 y.o.   MRN: [id number]  HPI   Sheila [name] is a 57 yo female here for f/u of hypertension. Last night her self-report BP was 210/112 and today is 160/117.  Pt saw PA Stegmayer for Subclavian stenosis in Jan - pt will return in March for more tests.  Pt reports she was previously on HCTZ and she notes chest pain and little improvement of BP.   Patient Active Problem List   Diagnosis Date Noted  . Subclavian artery stenosis (HCC) 08/11/2017  . Dizziness 03/08/2017  . Left sided numbness 03/08/2017  . Solitary pulmonary nodule 03/08/2017  . GERD (gastroesophageal reflux disease) 03/08/2017  . Chest pain 02/26/2017  . Abnormal EKG 02/26/2017  . Hypokalemia 02/26/2017  . Urge incontinence of urine 05/04/2016  . Recurrent UTI 03/02/2016  . COPD (chronic obstructive pulmonary disease) (HCC) 01/06/2016  . Adrenal nodule (HCC) 01/06/2016  . Tobacco abuse 01/06/2016  . Aorto-iliac atherosclerosis (HCC) 01/06/2016  . Vitamin D deficiency 01/06/2016  . Lower abdominal pain 01/06/2016  . Myopia of both eyes 01/06/2016  . Hypercalcemia 01/06/2016  . Essential hypertension 12/11/2015  . Pyelonephritis 12/03/2015  . Infection and inflammatory reaction due to other internal orthopedic device, implant, and graft 02/11/2012   Allergies as of 09/22/2017      Reactions   Vicodin [hydrocodone-acetaminophen] Nausea And Vomiting   Hct [hydrochlorothiazide] Palpitations   Tramadol Palpitations      Medication List        Accurate as of 09/22/17  7:06 PM. Always use your most recent med list.          amLODipine 10 MG tablet Commonly known as:  NORVASC Take 1 tablet (10 mg total) by mouth daily.   aspirin EC 81 MG tablet Take by mouth.   esomeprazole 40 MG capsule Commonly known as:  NEXIUM Take 1 capsule (40 mg total) by mouth daily at 12 noon.   ibuprofen 200 MG tablet Commonly known as:  ADVIL,MOTRIN Take  800 mg by mouth every 6 (six) hours as needed for headache or mild pain. Reported on 01/06/2016   lisinopril 40 MG tablet Commonly known as:  PRINIVIL,ZESTRIL Take 0.5 tablets (20 mg total) daily by mouth.        Review of Systems BP - pt reports she is taking her meds as rx.       Objective:   Physical Exam  Constitutional: She is oriented to person, place, and time. She appears well-developed and well-nourished.  Neurological: She is alert and oriented to person, place, and time.  Vitals reviewed.   BP (!) 160/117 (BP Location: Right Arm, Patient Position: Sitting, Cuff Size: Normal)   Pulse 88   Wt 165 lb 6.4 oz (75 kg)   LMP 08/09/2008 (Within Years)   BMI 30.25 kg/m        Assessment & Plan:   Rx Coreg 12.5 Mg BID. Refill Lisinopril 40mg  and Amlodipine 10mg .  F/u in 2 weeks for med eval.

## 2017-10-11 ENCOUNTER — Ambulatory Visit: Payer: Self-pay | Admitting: Urology

## 2017-10-11 VITALS — BP 192/104 | HR 73 | Temp 97.4°F | Wt 165.1 lb

## 2017-10-11 DIAGNOSIS — I10 Essential (primary) hypertension: Secondary | ICD-10-CM

## 2017-10-11 MED ORDER — GABAPENTIN 300 MG PO CAPS: 300.0000 mg | ORAL_CAPSULE | Freq: Every day | ORAL | 0 refills | Status: DC

## 2017-10-11 NOTE — Progress Notes (Signed)
   Subjective:    Patient ID: Sheila [name], female    DOB: 05/11/1961, 57 y.o.   MRN: [id number]  HPI  Sheila [name] is a 57 yo female here for f/u for hypertension. BP is elevated tonight at 192/104. She was started on Coreg 12.5 mg BID on 2/14.  Pt presents with severe headache for 2 days. Pt describes pain on L frontal and temporal. She denies vision changes. She is taking ibuprofen 800mg  TID w/ minimal relief. She endorses trouble sleeping. Pt endorses drinking caffeine - diet mountain dew, 2-3 cans daily.  Pt has apt with Vascular surgery for further testing for subclavian stenosis on 3/8.   Patient Active Problem List   Diagnosis Date Noted  . Subclavian artery stenosis (HCC) 08/11/2017  . Dizziness 03/08/2017  . Left sided numbness 03/08/2017  . Solitary pulmonary nodule 03/08/2017  . GERD (gastroesophageal reflux disease) 03/08/2017  . Chest pain 02/26/2017  . Abnormal EKG 02/26/2017  . Hypokalemia 02/26/2017  . Urge incontinence of urine 05/04/2016  . Recurrent UTI 03/02/2016  . COPD (chronic obstructive pulmonary disease) (HCC) 01/06/2016  . Adrenal nodule (HCC) 01/06/2016  . Tobacco abuse 01/06/2016  . Aorto-iliac atherosclerosis (HCC) 01/06/2016  . Vitamin D deficiency 01/06/2016  . Lower abdominal pain 01/06/2016  . Myopia of both eyes 01/06/2016  . Hypercalcemia 01/06/2016  . Essential hypertension 12/11/2015  . Pyelonephritis 12/03/2015  . Infection and inflammatory reaction due to other internal orthopedic device, implant, and graft 02/11/2012   Allergies as of 10/11/2017      Reactions   Vicodin [hydrocodone-acetaminophen] Nausea And Vomiting   Hct [hydrochlorothiazide] Palpitations   Tramadol Palpitations      Medication List        Accurate as of 10/11/17  8:05 PM. Always use your most recent med list.          amLODipine 10 MG tablet Commonly known as:  NORVASC Take 1 tablet (10 mg total) by mouth daily.   aspirin EC 81 MG tablet Take by mouth.   carvedilol 12.5 MG tablet Commonly known as:  COREG Take 1 tablet (12.5 mg total) by mouth 2 (two) times daily with a meal.   esomeprazole 40 MG capsule Commonly known as:  NEXIUM Take 1 capsule (40 mg total) by mouth daily at 12 noon.   ibuprofen 200 MG tablet Commonly known as:  ADVIL,MOTRIN Take 800 mg by mouth every 6 (six) hours as needed for headache or mild pain. Reported on 01/06/2016   lisinopril 40 MG tablet Commonly known as:  PRINIVIL,ZESTRIL Take 1 tablet (40 mg total) by mouth daily.        Review of Systems     Objective:   Physical Exam  BP (!) 192/104   Pulse 73   Temp (!) 97.4 F (36.3 C)   Wt 165 lb 1.6 oz (74.9 kg)   LMP 08/09/2008 (Within Years)   BMI 30.20 kg/m        Assessment & Plan:   Hypertension: uncontrolled. Refer to cardio.  Headaches: Rx Gabapentin 300mg .   F/u in 2 weeks to review vascular test results and med eval.

## 2017-10-12 ENCOUNTER — Telehealth: Payer: Self-pay

## 2017-10-12 NOTE — Telephone Encounter (Signed)
New patient referral [facility]   Scheduled 4/22 on waitlist   Please advise

## 2017-10-13 NOTE — Telephone Encounter (Signed)
See if patient can come in on 3/13 at 10:20 am on Dr Serita KyleEnd's schedule.

## 2017-10-13 NOTE — Telephone Encounter (Signed)
Sheila [name] had cancellation 3-14 scheduled from waitlist

## 2017-10-14 ENCOUNTER — Ambulatory Visit (INDEPENDENT_AMBULATORY_CARE_PROVIDER_SITE_OTHER): Payer: Self-pay

## 2017-10-14 ENCOUNTER — Encounter (INDEPENDENT_AMBULATORY_CARE_PROVIDER_SITE_OTHER): Payer: Self-pay | Admitting: Vascular Surgery

## 2017-10-14 ENCOUNTER — Encounter (INDEPENDENT_AMBULATORY_CARE_PROVIDER_SITE_OTHER): Payer: Self-pay

## 2017-10-14 ENCOUNTER — Other Ambulatory Visit (INDEPENDENT_AMBULATORY_CARE_PROVIDER_SITE_OTHER): Payer: Self-pay

## 2017-10-14 ENCOUNTER — Ambulatory Visit (INDEPENDENT_AMBULATORY_CARE_PROVIDER_SITE_OTHER): Payer: Self-pay | Admitting: Vascular Surgery

## 2017-10-14 VITALS — BP 186/99 | HR 72 | Resp 16 | Ht 62.0 in | Wt 164.0 lb

## 2017-10-14 DIAGNOSIS — I6523 Occlusion and stenosis of bilateral carotid arteries: Secondary | ICD-10-CM

## 2017-10-14 DIAGNOSIS — I771 Stricture of artery: Secondary | ICD-10-CM

## 2017-10-14 DIAGNOSIS — R42 Dizziness and giddiness: Secondary | ICD-10-CM

## 2017-10-14 DIAGNOSIS — I10 Essential (primary) hypertension: Secondary | ICD-10-CM

## 2017-10-14 DIAGNOSIS — I6529 Occlusion and stenosis of unspecified carotid artery: Secondary | ICD-10-CM | POA: Insufficient documentation

## 2017-10-14 NOTE — Progress Notes (Signed)
MRN : [id number]  Sheila [name] is a 57 y.o. (05/18/1961) female who presents with chief complaint of  Chief Complaint  Patient presents with  . Follow-up    ultrasound  .  History of Present Illness: Patient returns today in follow up of left arm pain.  She continues to have pain and tingling down her left arm and into her fingers.  Her left arm is somewhat weak and has difficulties with activities.  No ulcerations or infection.  Noninvasive studies today demonstrate 1-39% carotid artery stenosis bilaterally.  Her left subclavian artery has markedly elevated velocities consistent with a high-grade stenosis.  No stenosis was seen in the right subclavian artery.  Current Outpatient Medications  Medication Sig Dispense Refill  . amLODipine (NORVASC) 10 MG tablet Take 1 tablet (10 mg total) by mouth daily. 90 tablet 3  . carvedilol (COREG) 12.5 MG tablet Take 1 tablet (12.5 mg total) by mouth 2 (two) times daily with a meal. 60 tablet 3  . esomeprazole (NEXIUM) 40 MG capsule Take 1 capsule (40 mg total) by mouth daily at 12 noon. 90 capsule 3  . gabapentin (NEURONTIN) 300 MG capsule Take 1 capsule (300 mg total) by mouth at bedtime. 30 capsule 0  . ibuprofen (ADVIL,MOTRIN) 200 MG tablet Take 800 mg by mouth every 6 (six) hours as needed for headache or mild pain. Reported on 01/06/2016    . lisinopril (PRINIVIL,ZESTRIL) 40 MG tablet Take 1 tablet (40 mg total) by mouth daily. 90 tablet 4  . aspirin EC 81 MG tablet Take by mouth.     No current facility-administered medications for this visit.     Past Medical History:  Diagnosis Date  . Aorto-iliac atherosclerosis (HCC) 01/06/2016  . Asthma   . COPD (chronic obstructive pulmonary disease) (HCC)   . Hypertension   . Tobacco abuse 01/06/2016  . Vitamin D deficiency 01/06/2016    Past Surgical History:  Procedure Laterality Date  . ANKLE FRACTURE SURGERY Left 11/07/2009  . left ankle hardware removal    . left ankle surgery       Social History Social History   Tobacco Use  . Smoking status: Current Every Day Smoker    Packs/day: 1.00    Years: 30.00    Pack years: 30.00    Types: Cigarettes  . Smokeless tobacco: Never Used  Substance Use Topics  . Alcohol use: No    Alcohol/week: 0.0 oz  . Drug use: No     Family History Family History  Problem Relation Age of Onset  . COPD Mother   . Heart failure Mother   . COPD Father   . Heart failure Father      Allergies  Allergen Reactions  . Vicodin [Hydrocodone-Acetaminophen] Nausea And Vomiting  . Hct [Hydrochlorothiazide] Palpitations  . Tramadol Palpitations     REVIEW OF SYSTEMS (Negative unless checked)  Constitutional: [] Weight loss  [] Fever  [] Chills Cardiac: [] Chest pain   [] Chest pressure   [] Palpitations   [] Shortness of breath when laying flat   [] Shortness of breath at rest   [] Shortness of breath with exertion. Vascular:  [] Pain in legs with walking   [] Pain in legs at rest   [] Pain in legs when laying flat   [] Claudication   [] Pain in feet when walking  [] Pain in feet at rest  [] Pain in feet when laying flat   [] History of DVT   [] Phlebitis   [] Swelling in legs   [] Varicose veins   [] Non-healing  ulcers Pulmonary:   [] Uses home oxygen   [] Productive cough   [] Hemoptysis   [] Wheeze  [] COPD   [] Asthma Neurologic:  [x] Dizziness  [] Blackouts   [] Seizures   [] History of stroke   [] History of TIA  [] Aphasia   [] Temporary blindness   [] Dysphagia   [x] Weakness or numbness in arms   [] Weakness or numbness in legs Musculoskeletal:  [x] Arthritis   [] Joint swelling   [x] Joint pain   [] Low back pain Hematologic:  [] Easy bruising  [] Easy bleeding   [] Hypercoagulable state   [] Anemic   Gastrointestinal:  [] Blood in stool   [] Vomiting blood  [] Gastroesophageal reflux/heartburn   [] Abdominal pain Genitourinary:  [] Chronic kidney disease   [] Difficult urination  [] Frequent urination  [] Burning with urination   [] Hematuria Skin:  [] Rashes   [] Ulcers    [] Wounds Psychological:  [] History of anxiety   []  History of major depression.  Physical Examination  BP (!) 186/99   Pulse 72   Resp 16   Ht 5\' 2"  (1.575 m)   Wt 164 lb (74.4 kg)   LMP 08/09/2008 (Within Years)   BMI 30.00 kg/m  Gen:  WD/WN, NAD Head:  Beach/AT, No temporalis wasting. Ear/Nose/Throat: Hearing grossly intact, nares w/o erythema or drainage, trachea midline Eyes: Conjunctiva clear. Sclera non-icteric Neck: Supple.  No JVD.  Pulmonary:  Good air movement, no use of accessory muscles.  Cardiac: RRR, normal S1, S2 Vascular:  Vessel Right Left  Radial Palpable  1+ palpable                                    Musculoskeletal: M/S 5/5 throughout.  No deformity or atrophy.  No edema. Neurologic: Sensation grossly intact in extremities.  Symmetrical.  Speech is fluent.  Psychiatric: Judgment intact, Mood & affect appropriate for pt's clinical situation. Dermatologic: No rashes or ulcers noted.  No cellulitis or open wounds.       Labs No results found for this or any previous visit (from the past 2160 hour(s)).  Radiology No results found.   Assessment/Plan  Essential hypertension blood pressure control important in reducing the progression of atherosclerotic disease. On appropriate oral medications.   Carotid stenosis Mild.  1-39% bilaterally.  Continue aspirin.  Subclavian artery stenosis (HCC)  Noninvasive studies today demonstrate 1-39% carotid artery stenosis bilaterally.  Her left subclavian artery has markedly elevated velocities consistent with a high-grade stenosis.  No stenosis was seen in the right subclavian artery. This is significantly symptomatic.  It is difficult to discern whether or not she has steal symptoms.  These are not obvious but she does have some dizziness and postural issues.  Her left arm pain is her primary concern.  I do think that is somewhat related if not entirely related to her perfusion.  We discussed the role of  angiography with possible intervention.  We discussed the risks and benefits of the procedure.  The patient voices her understanding and desires to have an angiogram with possible revascularization.    Festus Barren, MD  10/14/2017 4:04 PM    This note was created with Dragon medical transcription system.  Any errors from dictation are purely unintentional

## 2017-10-14 NOTE — Assessment & Plan Note (Signed)
Mild.  1-39% bilaterally.  Continue aspirin.

## 2017-10-14 NOTE — Assessment & Plan Note (Signed)
blood pressure control important in reducing the progression of atherosclerotic disease. On appropriate oral medications.  

## 2017-10-14 NOTE — Assessment & Plan Note (Signed)
Noninvasive studies today demonstrate 1-39% carotid artery stenosis bilaterally.  Her left subclavian artery has markedly elevated velocities consistent with a high-grade stenosis.  No stenosis was seen in the right subclavian artery. This is significantly symptomatic.  It is difficult to discern whether or not she has steal symptoms.  These are not obvious but she does have some dizziness and postural issues.  Her left arm pain is her primary concern.  I do think that is somewhat related if not entirely related to her perfusion.  We discussed the role of angiography with possible intervention.  We discussed the risks and benefits of the procedure.  The patient voices her understanding and desires to have an angiogram with possible revascularization.

## 2017-10-14 NOTE — Patient Instructions (Signed)
Angiogram  An angiogram, also called angiography, is a procedure used to look at the blood vessels. In this procedure, dye is injected through a long, thin tube (catheter) into an artery. X-rays are then taken. The X-rays will show if there is a blockage or problem in a blood vessel.  Tell a health care provider about:   Any allergies you have, including allergies to shellfish or contrast dye.   All medicines you are taking, including vitamins, herbs, eye drops, creams, and over-the-counter medicines.   Any problems you or family members have had with anesthetic medicines.   Any blood disorders you have.   Any surgeries you have had.   Any previous kidney problems or failure you have had.   Any medical conditions you have.   Possibility of pregnancy, if this applies.  What are the risks?  Generally, an angiogram is a safe procedure. However, as with any procedure, problems can occur. Possible problems include:   Injury to the blood vessels, including rupture or bleeding.   Infection or bruising at the catheter site.   Allergic reaction to the dye or contrast used.   Kidney damage from the dye or contrast used.   Blood clots that can lead to a stroke or heart attack.    What happens before the procedure?   Do not eat or drink after midnight on the night before the procedure, or as directed by your health care provider.   Ask your health care provider if you may drink enough water to take any needed medicines the morning of the procedure.  What happens during the procedure?   You may be given a medicine to help you relax (sedative) before and during the procedure. This medicine is given through an IV access tube that is inserted into one of your veins.   The area where the catheter will be inserted will be washed and shaved. This is usually done in the groin but may be done in the fold of your arm (near your elbow) or in the wrist.   A medicine will be given to numb the area where the catheter will  be inserted (local anesthetic).   The catheter will be inserted with a guide wire into an artery. The catheter is guided by using a type of X-ray (fluoroscopy) to the blood vessel being examined.   Dye is then injected into the catheter, and X-rays are taken. The dye helps to show where any narrowing or blockages are located.  What happens after the procedure?   If the procedure is done through the leg, you will be kept in bed lying flat for several hours. You will be instructed to not bend or cross your legs.   The insertion site will be checked frequently.   The pulse in your feet or wrist will be checked frequently.   Additional blood tests, X-rays, and electrocardiography may be done.   You may need to stay in the hospital overnight for observation.  This information is not intended to replace advice given to you by your health care provider. Make sure you discuss any questions you have with your health care provider.  Document Released: 05/05/2005 Document Revised: 01/07/2016 Document Reviewed: 12/27/2012  Elsevier Interactive Patient Education  2017 [employer]

## 2017-10-16 MED ORDER — CEFAZOLIN SODIUM-DEXTROSE 1-4 GM/50ML-% IV SOLN
1.0000 g | Freq: Once | INTRAVENOUS | Status: AC
  Administered 2017-10-17: 1 g via INTRAVENOUS

## 2017-10-17 ENCOUNTER — Encounter
Admission: RE | Disposition: A | Discharge status: Home / Self Care | Payer: Self-pay | Source: Ambulatory Visit | Attending: Vascular Surgery

## 2017-10-17 ENCOUNTER — Telehealth: Payer: Self-pay | Admitting: Pharmacy Technician

## 2017-10-17 ENCOUNTER — Ambulatory Visit
Admission: RE | Admit: 2017-10-17 | Discharge: 2017-10-17 | Disposition: A | Discharge status: Home / Self Care | Payer: Self-pay | Source: Ambulatory Visit | Attending: Vascular Surgery | Admitting: Vascular Surgery

## 2017-10-17 DIAGNOSIS — M79602 Pain in left arm: Secondary | ICD-10-CM

## 2017-10-17 DIAGNOSIS — I771 Stricture of artery: Secondary | ICD-10-CM

## 2017-10-17 DIAGNOSIS — I708 Atherosclerosis of other arteries: Secondary | ICD-10-CM | POA: Insufficient documentation

## 2017-10-17 HISTORY — PX: LOWER EXTREMITY ANGIOGRAPHY: CATH118251

## 2017-10-17 LAB — BUN: BUN: 11 mg/dL (ref 6–20)

## 2017-10-17 LAB — CREATININE, SERUM
Creatinine, Ser: 0.84 mg/dL (ref 0.44–1.00)
GFR calc Af Amer: >60 mL/min (ref >60)
GFR calc non Af Amer: >60 mL/min (ref >60)

## 2017-10-17 SURGERY — LOWER EXTREMITY ANGIOGRAPHY
Anesthesia: Moderate Sedation | Laterality: Left

## 2017-10-17 MED ORDER — ATORVASTATIN CALCIUM 10 MG PO TABS: 10.0000 mg | ORAL_TABLET | Freq: Every day | ORAL | 11 refills | Status: DC

## 2017-10-17 MED ORDER — CEFAZOLIN SODIUM-DEXTROSE 1-4 GM/50ML-% IV SOLN
INTRAVENOUS | Status: AC
  Filled 2017-10-17: qty 50

## 2017-10-17 MED ORDER — HYDRALAZINE HCL 20 MG/ML IJ SOLN: 5.0000 mg | INTRAMUSCULAR | Status: DC | PRN

## 2017-10-17 MED ORDER — CLOPIDOGREL BISULFATE 75 MG PO TABS: 75.0000 mg | ORAL_TABLET | Freq: Every day | ORAL | 11 refills | Status: DC

## 2017-10-17 MED ORDER — HEPARIN (PORCINE) IN NACL 2-0.9 UNIT/ML-% IJ SOLN
INTRAMUSCULAR | Status: AC
  Filled 2017-10-17: qty 1000

## 2017-10-17 MED ORDER — MIDAZOLAM HCL 2 MG/2ML IJ SOLN
INTRAMUSCULAR | Status: DC | PRN
  Administered 2017-10-17 (×2): 2 mg via INTRAVENOUS

## 2017-10-17 MED ORDER — LIDOCAINE-EPINEPHRINE (PF) 1 %-1:200000 IJ SOLN
INTRAMUSCULAR | Status: AC
  Filled 2017-10-17: qty 30

## 2017-10-17 MED ORDER — MIDAZOLAM HCL 2 MG/2ML IJ SOLN
INTRAMUSCULAR | Status: AC
  Filled 2017-10-17: qty 4

## 2017-10-17 MED ORDER — HEPARIN SODIUM (PORCINE) 1000 UNIT/ML IJ SOLN
INTRAMUSCULAR | Status: AC
  Filled 2017-10-17: qty 1

## 2017-10-17 MED ORDER — SODIUM CHLORIDE 0.9 % IV SOLN: 250.0000 mL | INTRAVENOUS | Status: DC | PRN

## 2017-10-17 MED ORDER — IOPAMIDOL (ISOVUE-300) INJECTION 61%
INTRAVENOUS | Status: DC | PRN
  Administered 2017-10-17: 50 mL via INTRAVENOUS

## 2017-10-17 MED ORDER — HEPARIN SODIUM (PORCINE) 1000 UNIT/ML IJ SOLN
INTRAMUSCULAR | Status: DC | PRN
  Administered 2017-10-17: 4000 [IU] via INTRAVENOUS

## 2017-10-17 MED ORDER — ATORVASTATIN CALCIUM 10 MG PO TABS
10.0000 mg | ORAL_TABLET | Freq: Every day | ORAL | Status: DC
  Filled 2017-10-17: qty 1

## 2017-10-17 MED ORDER — SODIUM CHLORIDE 0.9 % IV SOLN: INTRAVENOUS | Status: DC

## 2017-10-17 MED ORDER — LABETALOL HCL 5 MG/ML IV SOLN: 10.0000 mg | INTRAVENOUS | Status: DC | PRN

## 2017-10-17 MED ORDER — FENTANYL CITRATE (PF) 100 MCG/2ML IJ SOLN
INTRAMUSCULAR | Status: AC
  Filled 2017-10-17: qty 2

## 2017-10-17 MED ORDER — CLOPIDOGREL BISULFATE 75 MG PO TABS: 75.0000 mg | ORAL_TABLET | Freq: Every day | ORAL | Status: DC

## 2017-10-17 MED ORDER — SODIUM CHLORIDE 0.9 % IV SOLN
INTRAVENOUS | Status: DC
  Administered 2017-10-17: 08:00:00 via INTRAVENOUS

## 2017-10-17 MED ORDER — SODIUM CHLORIDE 0.9% FLUSH: 3.0000 mL | Freq: Two times a day (BID) | INTRAVENOUS | Status: DC

## 2017-10-17 MED ORDER — SODIUM CHLORIDE 0.9% FLUSH: 3.0000 mL | INTRAVENOUS | Status: DC | PRN

## 2017-10-17 MED ORDER — FENTANYL CITRATE (PF) 100 MCG/2ML IJ SOLN
INTRAMUSCULAR | Status: DC | PRN
  Administered 2017-10-17 (×2): 50 ug via INTRAVENOUS

## 2017-10-17 SURGICAL SUPPLY — 15 items
BALLN LUTONIX DCB 6X40X130 (BALLOONS) ×3
BALLOON LUTONIX DCB 6X40X130 (BALLOONS) ×1 IMPLANT
CATH ANGIO 5F 100CM .035 PIG (CATHETERS) ×3 IMPLANT
CATH BEACON 5 .035 100 H1 TIP (CATHETERS) ×3 IMPLANT
CATH PIG 70CM (CATHETERS) IMPLANT
DEVICE PRESTO INFLATION (MISCELLANEOUS) ×3 IMPLANT
DEVICE STARCLOSE SE CLOSURE (Vascular Products) ×3 IMPLANT
DEVICE TORQUE (MISCELLANEOUS) ×3 IMPLANT
GLIDEWIRE ADV .035X260CM (WIRE) ×3 IMPLANT
PACK ANGIOGRAPHY (CUSTOM PROCEDURE TRAY) ×3 IMPLANT
SHEATH BRITE TIP 5FRX11 (SHEATH) ×3 IMPLANT
SHEATH RAABE 6FRX70 (SHEATH) ×3 IMPLANT
SYR MEDRAD MARK V 150ML (SYRINGE) ×3 IMPLANT
TUBING CONTRAST HIGH PRESS 72 (TUBING) ×3 IMPLANT
WIRE J 3MM .035X145CM (WIRE) ×3 IMPLANT

## 2017-10-17 NOTE — Telephone Encounter (Signed)
Patient failed to provide 2019 financial documentation.  No additional medication assistance will be provided by [facility] without the required proof of income documentation.  Patient notified by letter.  Shavontae Gibeault J. Collene Massimino Care Manager [facility] 

## 2017-10-17 NOTE — H&P (Signed)
[facility] History & Physical Update  The patient was interviewed and re-examined.  The patient's previous History and Physical has been reviewed and is unchanged.  There is no change in the plan of care. We plan to proceed with the scheduled procedure.  Festus BarrenJason Dew, MD  10/17/2017, 9:04 AM

## 2017-10-17 NOTE — Op Note (Signed)
OPERATIVE REPORT   PREOPERATIVE DIAGNOSIS: 1.  Symptomatic left subclavian artery stenosis with dizziness and left arm pain  POSTOPERATIVE DIAGNOSIS: Same as above  PROCEDURE PERFORMED: 1. Ultrasound guidance vascular access to right femoral artery. 2. Catheter placement to left axillary artery  from right femoral approach. 3. Thoracic aortogram and selective left upper extremity angiogram  including selective images of the radial and ulnar arteries. 4.  Percutaneous transluminal angioplasty of the left subclavian artery with 2 inflations with a 6 mm diameter by 4 cm length Lutonix drug-coated angioplasty balloon 5. StarClose closure device right femoral artery.  SURGEON: Algernon Huxley, MD  ANESTHESIA: Local with moderate conscious sedation for 30 minutes using 4 mg of Versed and 100 Mcg of Fentanyl  BLOOD LOSS: Minimal.  FLUOROSCOPY TIME:2.9 minutes  CONTRAST: 50 cc  INDICATION FOR PROCEDURE: This is a 57 y.o.female who presented to our office with left arm pain and dizziness.  A duplex showed only mild carotid disease, but high-grade left subclavian artery stenosis.  To further evaluate this to determine what options would be possible to treat the subclavian stenosis, angiogram of the left upper extremity is indicated. Risks and benefits are discussed. Informed consent was obtained.  DESCRIPTION OF PROCEDURE: The patient was brought to the vascular suite. Moderate conscious sedation was administered during a face to face encounter with the patient throughout the procedure with my supervision of the RN administering medicines and monitoring the patient's vital signs, pulse oximetry, telemetry and mental status throughout from the start of the procedure until the patient was taken to the recovery room.  Groins were shaved and prepped and sterile surgical field was created. The right femoral head was localized with fluoroscopy and the right femoral artery  was then visualized with ultrasound and found to be widely patent. It was then accessed under direct ultrasound guidance without difficulty with a Seldinger needle and a permanent image was recorded. A J-wire and 5-French sheath were then placed. Pigtail catheter was placed into the ascending aorta and a thoracic aortogram was then performed in the LAO projection. This demonstrated normal origins to the great vessels without significant proximal stenoses in the innominate and left carotid artery and a normal configuration of the great vessels. The original aortogram did show what appeared to be about a 90% stenosis of the left subclavian artery just proximal to the vertebral artery which was of reasonable size.  There was then a moderate stenosis in the 60-70% range beyond the vertebral artery and the left subclavian artery.  The patient was given 4000 units of intravenous heparin and a headhunter catheter was used to selectively cannulate the left subclavian artery without difficulty. We easily cross the lesion and advanced the headhunter catheter into the left axillary artery.  There was no stenosis in the distal left subclavian artery, axillary artery, or proximal to mid left brachial artery identified.  I then replaced the advantage wire and remove the diagnostic catheter.  A 70 cm 6 French sheath was then placed over the advantage wire into the proximal left subclavian artery.  I then selected a 6 mm diameter 4 cm length Lutonix drug-coated angioplasty balloon to treat the subclavian lesions.  This was slightly undersized for the more proximal lesion but I elected to perform with a slightly undersized balloon in hopes of avoiding a stent placement across the vertebral artery.  The first inflation in the proximal left subclavian artery just to the vertebral artery was inflated to 10 atm for 1 minute.  A second inflation just beyond the left vertebral artery and the mid left subclavian artery was  inflated to 8 atm for 1 minute.  Completion angiogram performed through the sheath demonstrated about a 35-40% residual stenosis in the proximal left subclavian artery and about a 25-30% residual stenosis in the mid left subclavian artery beyond the vertebral artery.  I elected to leave the mild residual stenosis to avoid stent placement at this time.  The sheath and the wire were removed from the left subclavian artery and pulled down to the ipsilateral iliac artery.  Oblique arteriogram was performed of the right femoral artery and StarClose closure device deployed in the usual fashion with excellent hemostatic result. The patient tolerated the procedure well and was taken to the recovery room in stable condition.   Leotis Pain 10/17/2017 9:56 AM

## 2017-10-17 NOTE — Progress Notes (Signed)
Dr. Wyn Quakerew in at bedside to speak with pt. And pt. Significant other re: procedural results. Both vrbalize understanding.

## 2017-10-20 ENCOUNTER — Encounter: Payer: Self-pay | Admitting: Cardiovascular Disease

## 2017-10-20 ENCOUNTER — Ambulatory Visit (INDEPENDENT_AMBULATORY_CARE_PROVIDER_SITE_OTHER): Payer: Self-pay | Admitting: Cardiovascular Disease

## 2017-10-20 VITALS — BP 200/106 | HR 71 | Ht 62.0 in | Wt 162.0 lb

## 2017-10-20 DIAGNOSIS — I739 Peripheral vascular disease, unspecified: Secondary | ICD-10-CM

## 2017-10-20 DIAGNOSIS — J42 Unspecified chronic bronchitis: Secondary | ICD-10-CM

## 2017-10-20 DIAGNOSIS — I70299 Other atherosclerosis of native arteries of extremities, unspecified extremity: Secondary | ICD-10-CM

## 2017-10-20 DIAGNOSIS — Z720 Tobacco use: Secondary | ICD-10-CM

## 2017-10-20 DIAGNOSIS — I708 Atherosclerosis of other arteries: Secondary | ICD-10-CM

## 2017-10-20 DIAGNOSIS — I10 Essential (primary) hypertension: Secondary | ICD-10-CM | POA: Insufficient documentation

## 2017-10-20 DIAGNOSIS — I771 Stricture of artery: Secondary | ICD-10-CM

## 2017-10-20 DIAGNOSIS — I700 Atherosclerosis of aorta: Secondary | ICD-10-CM

## 2017-10-20 MED ORDER — CLONIDINE HCL 0.1 MG PO TABS
0.1000 mg | ORAL_TABLET | Freq: Two times a day (BID) | ORAL | 11 refills | Status: DC
Start: 2017-10-20 — End: 2017-12-06

## 2017-10-20 NOTE — Patient Instructions (Addendum)
Medication Instructions:   Please start clonidine 0.1 mg twice a day   Labwork:  No new labs needed  Testing/Procedures:  We will order a renal artery ultrasound for malignant HTN During this test, an ultrasound is used to evaluate blood flow to the kidneys. Allow one hour for this exam. Do not eat after midnight the day before and avoid carbonated beverages. Take your medications as you usually do.  Follow-Up: It was a pleasure seeing you in the office today. Please call us if you have new issues that need to be addressed before your next appt.  [phone]  Your physician wants you to follow-up in: 1 month.   If you need a refill on your cardiac medications before your next appointment, please call your pharmacy.  For educational health videos Log in to : [email or web address] Or : [email or web address], password : [email or web address]

## 2017-10-20 NOTE — Progress Notes (Signed)
Cardiology Office Note  Date:  10/20/2017   ID:  Sheila [name], DOB [birth year], MRN [id number]  PCP:  System, Pcp Not In   Chief Complaint  Patient presents with  . New Patient (Initial Visit)    Referred by [facility] for high blood pressure. Patient c/o chestt pain and SOB. Meds reviewed verbally with patient.     HPI:  Sheila [name] is a 57 year old woman with past medical history of Hypertension Headaches Subclavian stenosis/  causing left arm pain COPD Smoker Aortoiliac atherosclerosis Who presents by referral from [facility] for malignant hypertension  Notes indicating recent Left arm pain, tingling down left arm into the fingers Noninvasive study by Dr. Wyn Quaker shows less than 39% bilateral carotid stenosis bilaterally Left subclavian markedly elevated velocities consistent with high-grade stenosis No stenosis on the right intervention of the left subclavian October 17, 2017   Currently on amlodipine, carvedilol, lisinopril Denies missing any doses  She does monitor blood pressure at home, this continues to run high Typically 170 up to 180 at home Denies ever having renal artery ultrasound  No chest pain, shortness of breath on exertion  EKG personally reviewed by myself on todays visit Shows normal sinus rhythm rate 71 bpm nonspecific ST abnormality  Family hx : HTN Mom and dad with COPD   PMH:   has a past medical history of Aorto-iliac atherosclerosis (HCC) (01/06/2016), Asthma, COPD (chronic obstructive pulmonary disease) (HCC), Hypertension, Tobacco abuse (01/06/2016), and Vitamin D deficiency (01/06/2016).  PSH:    Past Surgical History:  Procedure Laterality Date  . ANKLE FRACTURE SURGERY Left 11/07/2009  . left ankle hardware removal    . left ankle surgery    . LOWER EXTREMITY ANGIOGRAPHY Left 10/17/2017   Procedure: LOWER EXTREMITY ANGIOGRAPHY;  Surgeon: Annice Needy, MD;  Location: [facility] INVASIVE CV LAB;  Service: Cardiovascular;  Laterality:  Left;    Current Outpatient Medications  Medication Sig Dispense Refill  . amLODipine (NORVASC) 10 MG tablet Take 1 tablet (10 mg total) by mouth daily. 90 tablet 3  . aspirin EC 81 MG tablet Take by mouth.    Marland Kitchen atorvastatin (LIPITOR) 10 MG tablet Take 1 tablet (10 mg total) by mouth daily. 30 tablet 11  . carvedilol (COREG) 12.5 MG tablet Take 1 tablet (12.5 mg total) by mouth 2 (two) times daily with a meal. 60 tablet 3  . clopidogrel (PLAVIX) 75 MG tablet Take 1 tablet (75 mg total) by mouth daily. 30 tablet 11  . esomeprazole (NEXIUM) 40 MG capsule Take 1 capsule (40 mg total) by mouth daily at 12 noon. 90 capsule 3  . gabapentin (NEURONTIN) 300 MG capsule Take 1 capsule (300 mg total) by mouth at bedtime. 30 capsule 0  . ibuprofen (ADVIL,MOTRIN) 200 MG tablet Take 800 mg by mouth every 6 (six) hours as needed for headache or mild pain. Reported on 01/06/2016    . lisinopril (PRINIVIL,ZESTRIL) 40 MG tablet Take 1 tablet (40 mg total) by mouth daily. 90 tablet 4  . cloNIDine (CATAPRES) 0.1 MG tablet Take 1 tablet (0.1 mg total) by mouth 2 (two) times daily. 60 tablet 11   No current facility-administered medications for this visit.      Allergies:   Vicodin [hydrocodone-acetaminophen]; Hct [hydrochlorothiazide]; and Tramadol   Social History:  The patient  reports that she has been smoking cigarettes.  She has a 30.00 pack-year smoking history. she has never used smokeless tobacco. She reports that she does not drink alcohol  or use drugs.   Family History:   family history includes COPD in her father and mother; Heart failure in her father and mother.    Review of Systems: Review of Systems  Constitutional: Negative.   Respiratory: Negative.   Cardiovascular: Negative.   Gastrointestinal: Negative.   Musculoskeletal: Negative.   Neurological: Negative.   Psychiatric/Behavioral: Negative.   All other systems reviewed and are negative.    PHYSICAL EXAM: VS:  BP (!) 200/106  (BP Location: Right Arm, Patient Position: Sitting, Cuff Size: Normal)   Pulse 71   Ht  (1.575 m)   Wt 162 lb (73.5 kg)   LMP 08/09/2008 (Within Years)   BMI 29.63 kg/m  , BMI Body mass index is 29.63 kg/m. GEN: Well nourished, well developed, in no acute distress  HEENT: normal  Neck: no JVD, + carotid bruit on the left, no masses Cardiac: RRR; no murmurs, rubs, or gallops,no edema  Respiratory: Moderately decreased breath sounds throughout, normal work of breathing GI: soft, nontender, nondistended, + BS MS: no deformity or atrophy  Skin: warm and dry, no rash Neuro:  Strength and sensation are intact Psych: euthymic mood, full affect   Recent Labs: 02/27/2017: ALT 14; Hemoglobin 13.7; Platelets 282 06/16/2017: Potassium 3.8; Sodium 144 10/17/2017: BUN 11; Creatinine, Ser 0.84    Lipid Panel No results found for: CHOL, HDL, LDLCALC, TRIG    Wt Readings from Last 3 Encounters:  10/20/17 162 lb (73.5 kg)  10/17/17 164 lb (74.4 kg)  10/14/17 164 lb (74.4 kg)       ASSESSMENT AND PLAN:  HTN (hypertension), malignant - Plan: EKG 12-Lead, cloNIDine (CATAPRES) 0.1 MG tablet, VAS US RENAL ARTERY DUPLEX Etiology unclear We will add clonidine 0.1 mg twice daily Recommend she monitor blood pressure at home and write these down We have ordered renal artery duplex given known PAD  PAD (peripheral artery disease) (HCC) Carotid disease, left subclavian stenosis with recent intervention Also with aortoiliac disease followed by vascular surgery  Aorto-iliac atherosclerosis (HCC)  Subclavian artery stenosis (HCC) Recent intervention, now with strong radial artery pulse on the left Left arm pain has resolved  Smoker We have encouraged her to continue to work on weaning her cigarettes and smoking cessation. She will continue to work on this and does not want any assistance with chantix.   Chronic bronchitis, unspecified chronic bronchitis type (HCC) Again recommended  smoking cessation  Disposition:   F/U  1 month   Total encounter time more than 45 minutes  Greater than 50% was spent in counseling and coordination of care with the patient    Orders Placed This Encounter  Procedures  . EKG 12-Lead     Signed, Dossie Arbour, M.D., Ph.D. 10/20/2017  [facility] [place], [place] [phone] -

## 2017-10-21 ENCOUNTER — Telehealth: Payer: Self-pay | Admitting: Pharmacy Technician

## 2017-10-21 ENCOUNTER — Ambulatory Visit: Payer: Self-pay

## 2017-10-21 NOTE — Telephone Encounter (Signed)
Received updated proof of income.  Patient eligible to receive medication assistance at [facility] through 2019, as long as eligibility requirements continue to be met.  [facility]

## 2017-10-26 ENCOUNTER — Other Ambulatory Visit: Payer: Self-pay | Admitting: Cardiovascular Disease

## 2017-10-26 ENCOUNTER — Ambulatory Visit (INDEPENDENT_AMBULATORY_CARE_PROVIDER_SITE_OTHER): Payer: Self-pay

## 2017-10-26 DIAGNOSIS — F17210 Nicotine dependence, cigarettes, uncomplicated: Secondary | ICD-10-CM

## 2017-10-26 DIAGNOSIS — I10 Essential (primary) hypertension: Secondary | ICD-10-CM

## 2017-10-27 ENCOUNTER — Ambulatory Visit: Payer: Self-pay | Admitting: Obstetrics and Gynecology

## 2017-10-27 ENCOUNTER — Encounter: Payer: Self-pay | Admitting: Obstetrics and Gynecology

## 2017-10-27 VITALS — BP 181/91 | HR 67 | Temp 97.5°F | Wt 167.8 lb

## 2017-10-27 DIAGNOSIS — I10 Essential (primary) hypertension: Secondary | ICD-10-CM

## 2017-10-27 DIAGNOSIS — J302 Other seasonal allergic rhinitis: Secondary | ICD-10-CM

## 2017-10-27 NOTE — Patient Instructions (Signed)
I value your feedback and entrusting us with your care. If you get a [facility] patient survey, I would appreciate you taking the time to let us know about your experience today. Thank you! 

## 2017-10-27 NOTE — Progress Notes (Signed)
System, Pcp Not In   Chief Complaint  Patient presents with  . Follow-up    sinus pain, started a few days ago    HPI:      [name] is a 57 y.o. No obstetric history on file. who LMP was Patient's last menstrual period was 08/09/2008 (within years)., presents today for HTN f/u. Saw Dr. Mariah MillingGollan 10/20/17 for malignant HTN. He added clonidine 0.1 mg BID. Pt states BP at home is 160s-170s/97-101, so coming down slowly. Notes feeling sleepy on it but no other side effects. Has not stopped smoking, but has decreased sodium and caffeine in her diet. She is exercising. States she doesn't need Rx RF now. Getting meds from Dr. Mariah MillingGollan. Had normal renal artery duplex yesterday with Dr. Mariah MillingGollan. Has f/u with him in 3 wks.  10/20/17 Dr. Mariah MillingGollan: HTN (hypertension), malignant - Plan: EKG 12-Lead, cloNIDine (CATAPRES) 0.1 MG tablet, VAS US RENAL ARTERY DUPLEX Etiology unclear We will add clonidine 0.1 mg twice daily Recommend she monitor blood pressure at home and write these down We have ordered renal artery duplex given known PAD  Also notes seasonal allergies with sinus sx.  Past Medical History:  Diagnosis Date  . Aorto-iliac atherosclerosis (HCC) 01/06/2016  . Asthma   . COPD (chronic obstructive pulmonary disease) (HCC)   . Hypertension   . Tobacco abuse 01/06/2016  . Vitamin D deficiency 01/06/2016    Past Surgical History:  Procedure Laterality Date  . ANKLE FRACTURE SURGERY Left 11/07/2009  . left ankle hardware removal    . left ankle surgery    . LOWER EXTREMITY ANGIOGRAPHY Left 10/17/2017   Procedure: LOWER EXTREMITY ANGIOGRAPHY;  Surgeon: Annice Needyew, Jason S, MD;  Location: [facility] INVASIVE CV LAB;  Service: Cardiovascular;  Laterality: Left;    Family History  Problem Relation Age of Onset  . COPD Mother   . Heart failure Mother   . COPD Father   . Heart failure Father     Social History   Socioeconomic History  . Marital status: Divorced    Spouse name: Not on file   . Number of children: Not on file  . Years of education: Not on file  . Highest education level: Not on file  Occupational History  . Not on file  Social Needs  . Financial resource strain: Not on file  . Food insecurity:    Worry: Not on file    Inability: Not on file  . Transportation needs:    Medical: Not on file    Non-medical: Not on file  Tobacco Use  . Smoking status: Current Every Day Smoker    Packs/day: 1.00    Years: 30.00    Pack years: 30.00    Types: Cigarettes  . Smokeless tobacco: Never Used  Substance and Sexual Activity  . Alcohol use: No    Alcohol/week: 0.0 oz  . Drug use: No  . Sexual activity: Never  Lifestyle  . Physical activity:    Days per week: Not on file    Minutes per session: Not on file  . Stress: Not on file  Relationships  . Social connections:    Talks on phone: Not on file    Gets together: Not on file    Attends religious service: Not on file    Active member of club or organization: Not on file    Attends meetings of clubs or organizations: Not on file    Relationship status: Not on file  .  Intimate partner violence:    Fear of current or ex partner: Not on file    Emotionally abused: Not on file    Physically abused: Not on file    Forced sexual activity: Not on file  Other Topics Concern  . Not on file  Social History Narrative  . Not on file    Outpatient Medications Prior to Visit  Medication Sig Dispense Refill  . amLODipine (NORVASC) 10 MG tablet Take 1 tablet (10 mg total) by mouth daily. 90 tablet 3  . aspirin EC 81 MG tablet Take by mouth.    Marland Kitchen atorvastatin (LIPITOR) 10 MG tablet Take 1 tablet (10 mg total) by mouth daily. 30 tablet 11  . carvedilol (COREG) 12.5 MG tablet Take 1 tablet (12.5 mg total) by mouth 2 (two) times daily with a meal. 60 tablet 3  . cloNIDine (CATAPRES) 0.1 MG tablet Take 1 tablet (0.1 mg total) by mouth 2 (two) times daily. 60 tablet 11  . clopidogrel (PLAVIX) 75 MG tablet Take 1 tablet  (75 mg total) by mouth daily. 30 tablet 11  . esomeprazole (NEXIUM) 40 MG capsule Take 1 capsule (40 mg total) by mouth daily at 12 noon. 90 capsule 3  . gabapentin (NEURONTIN) 300 MG capsule Take 1 capsule (300 mg total) by mouth at bedtime. 30 capsule 0  . ibuprofen (ADVIL,MOTRIN) 200 MG tablet Take 800 mg by mouth every 6 (six) hours as needed for headache or mild pain. Reported on 01/06/2016    . lisinopril (PRINIVIL,ZESTRIL) 40 MG tablet Take 1 tablet (40 mg total) by mouth daily. 90 tablet 4   No facility-administered medications prior to visit.     ROS:  Review of Systems  Constitutional: Negative for fatigue, fever and unexpected weight change.  HENT: Positive for sinus pressure. Negative for sinus pain.   Respiratory: Negative for cough, shortness of breath and wheezing.   Cardiovascular: Negative for chest pain, palpitations and leg swelling.  Gastrointestinal: Negative for blood in stool, constipation, diarrhea, nausea and vomiting.  Endocrine: Negative for cold intolerance, heat intolerance and polyuria.  Genitourinary: Negative for dyspareunia, dysuria, flank pain, frequency, genital sores, hematuria, menstrual problem, pelvic pain, urgency, vaginal bleeding, vaginal discharge and vaginal pain.  Musculoskeletal: Negative for back pain, joint swelling and myalgias.  Skin: Negative for rash.  Neurological: Negative for dizziness, syncope, light-headedness, numbness and headaches.  Hematological: Negative for adenopathy.  Psychiatric/Behavioral: Negative for agitation, confusion, sleep disturbance and suicidal ideas. The patient is not nervous/anxious.    BREAST: No symptoms  OBJECTIVE:   Vitals:  BP (!) 181/91 (BP Location: Right Arm, Patient Position: Sitting, Cuff Size: Normal)   Pulse 67   Temp (!) 97.5 F (36.4 C)   Wt 167 lb 12.8 oz (76.1 kg)   LMP 08/09/2008 (Within Years)   BMI 30.69 kg/m   Physical Exam  Constitutional: She is oriented to person, place, and  time and well-developed, well-nourished, and in no distress.  Pulmonary/Chest: Effort normal.  Musculoskeletal: Normal range of motion.  Neurological: She is alert and oriented to person, place, and time.  Psychiatric: Memory, affect and judgment normal.  Nursing note and vitals reviewed.  Assessment/Plan: Malignant hypertension - BP slightly improved with clonidine 0.1 mg BID. Has f/u with Dr. Mariah Milling in 3 wks.  Cont to monitor BPs at home. Getting Rx RF through Dr. Mariah Milling per pt report.  Seasonal allergies - Can try zyrtec or claritin.   Return in about 1 month (around 11/24/2017) for HTN f/u.  Sheila [name] B. Ata Pecha, PA-C 10/27/2017 7:29 PM

## 2017-11-14 ENCOUNTER — Ambulatory Visit: Payer: Self-pay | Admitting: Pharmacist

## 2017-11-14 ENCOUNTER — Other Ambulatory Visit: Payer: Self-pay

## 2017-11-14 ENCOUNTER — Encounter: Payer: Self-pay | Admitting: Pharmacist

## 2017-11-14 ENCOUNTER — Other Ambulatory Visit: Payer: Self-pay | Admitting: Urology

## 2017-11-14 VITALS — BP 162/72 | Ht 62.0 in | Wt 162.0 lb

## 2017-11-14 DIAGNOSIS — Z79899 Other long term (current) drug therapy: Secondary | ICD-10-CM

## 2017-11-14 NOTE — Progress Notes (Addendum)
[facility] Visit Note  Patient: Sheila [name] MRN: [id number] Date of Birth: 12-Feb-1961 PCP: System, Pcp Not In   Sheila [name] 57 y.o. female presents for an initial medication therapy management visit with the pharmacist today.  BP (!) 162/72 (BP Location: Right Arm, Patient Position: Sitting, Cuff Size: Large)   Ht 5\' 2"  (1.575 m)   Wt 162 lb (73.5 kg)   LMP 08/09/2008 (Within Years)   BMI 29.63 kg/m   Patient Information   Past Medical History:  Diagnosis Date  . Aorto-iliac atherosclerosis (HCC) 01/06/2016  . Asthma   . COPD (chronic obstructive pulmonary disease) (HCC)   . Hypertension   . Tobacco abuse 01/06/2016  . Vitamin D deficiency 01/06/2016      Past Surgical History:  Procedure Laterality Date  . ANKLE FRACTURE SURGERY Left 11/07/2009  . left ankle hardware removal    . left ankle surgery    . LOWER EXTREMITY ANGIOGRAPHY Left 10/17/2017   Procedure: LOWER EXTREMITY ANGIOGRAPHY;  Surgeon: Annice Needy, MD;  Location: [facility] INVASIVE CV LAB;  Service: Cardiovascular;  Laterality: Left;     Family History  Problem Relation Age of Onset  . COPD Mother   . Heart failure Mother   . COPD Father   . Heart failure Father     New Diagnoses (since last visit): NA  Family Support: Good          Social History   Substance and Sexual Activity  Alcohol Use No  . Alcohol/week: 0.0 oz      Social History   Tobacco Use  Smoking Status Current Every Day Smoker  . Packs/day: 1.00  . Years: 30.00  . Pack years: 30.00  . Types: Cigarettes  Smokeless Tobacco Never Used      Health Maintenance  Topic Date Due  . Hepatitis C Screening  04/07/60  . TETANUS/TDAP  04/10/1979  . PAP SMEAR  04/09/1981  . MAMMOGRAM  04/09/2010  . COLONOSCOPY  04/09/2010  . INFLUENZA VACCINE  03/09/2018  . HIV Screening  Completed   Outpatient Encounter Medications as of 11/14/2017  Medication Sig  . amLODipine (NORVASC) 10 MG tablet Take 1 tablet (10  mg total) by mouth daily.  Marland Kitchen aspirin EC 81 MG tablet Take by mouth.  Marland Kitchen atorvastatin (LIPITOR) 10 MG tablet Take 1 tablet (10 mg total) by mouth daily.  . carvedilol (COREG) 12.5 MG tablet Take 1 tablet (12.5 mg total) by mouth 2 (two) times daily with a meal.  . cloNIDine (CATAPRES) 0.1 MG tablet Take 1 tablet (0.1 mg total) by mouth 2 (two) times daily.  . clopidogrel (PLAVIX) 75 MG tablet Take 1 tablet (75 mg total) by mouth daily.  Marland Kitchen esomeprazole (NEXIUM) 40 MG capsule Take 1 capsule (40 mg total) by mouth daily at 12 noon.  . gabapentin (NEURONTIN) 300 MG capsule Take 1 capsule (300 mg total) by mouth at bedtime.  Marland Kitchen lisinopril (PRINIVIL,ZESTRIL) 40 MG tablet Take 1 tablet (40 mg total) by mouth daily.  . [DISCONTINUED] ibuprofen (ADVIL,MOTRIN) 200 MG tablet Take 800 mg by mouth every 6 (six) hours as needed for headache or mild pain. Reported on 01/06/2016   No facility-administered encounter medications on file as of 11/14/2017.     Assessment and Plan:  1. Medication Compliance: Patient is able to correctly state the three W's (what, why, when) for each of her medications without prompting. Reports she is interested in using a pill box and one was given to  her today. She states that she takes her medications regularly, at scheduled times and rarely misses doses.   2. Hypertension: Patient currently taking amlodipine, carvedilol, clonidine and lisinopril. She sates her BP usually runs 160-170 systolic at home, though has been as low as 118 systolic, recently. BP elevated in office today, though could be due to smoking prior to visit and not taking her medications before her visit this morning. Clonidine was added ~3.5 weeks ago, and patient has tolerated this well with the exception of ~1 missed dose that left her feeling "swimmy headed" and with a headache due to hypertension. Discussed the potential for rebound hypertension with missed doses, patient verbalized understanding. Discussed limiting  sodium intake (goal <2,300 mg daily) and smoking cessation in effort to lower blood pressure, patient verbalized understanding.   3. Atherosclerosis: Patient currently taking clopidogrel and ASA, well tolerated, no signs or symptoms of bleeding. Potential DDI noted with esomeprazole and clopidogrel (patient aware, MD faxed).   4. GERD: Patient currently taking esomeprazole, well controlled.   5. Smoking: Patient currently smoking ~1 PPD (previously down to ~1/2 PPD, increased as of late due to family stressors). Patient states she has tried NRT patches and quitting cold [place] in the past but eventually relapsed. She is open to discussion of smoking cessation, but is not ready to commit to quitting at this time. Made patient aware of resources available when she does decide she is ready to quit.   RTC: 6 months  Cori RazorLauren Bethel Sirois, PharmD Candidate  Cosigned:Christan Leonor LivHolt, PharmD, RPh [facility] [phone]

## 2017-11-17 ENCOUNTER — Other Ambulatory Visit: Payer: Self-pay | Admitting: Adult Health Nurse Practitioner

## 2017-11-17 MED ORDER — PANTOPRAZOLE SODIUM 40 MG PO TBEC: 40.0000 mg | DELAYED_RELEASE_TABLET | Freq: Every day | ORAL | 3 refills | Status: DC

## 2017-11-19 NOTE — Progress Notes (Signed)
Cardiology Office Note  Date:  11/22/2017   ID:  Sheila [name], DOB Jul 16, 1961, MRN [id number]  PCP:  System, Pcp Not In   Chief Complaint  Patient presents with  . OTHER    1 month f/u c/o chest discomfort. Meds reviewed verbally with pt.    HPI:  Sheila [name] is a 57 year old woman with past medical history of Hypertension Headaches PAD Subclavian stenosis/  causing left arm pain intervention of the left subclavian October 17, 2017  COPD Smoker Aorto iliac atherosclerosis Who presents  for malignant hypertension, PAD  Renal u/s 10/26/2017 No significant renal artery stenosis to explain high blood pressure  Currently on amlodipine pm, carvedilol am and pm, lisinopril pm, clonidine am and pm Denies missing any doses She is having occasional dizziness when she stands up  CT chest 06/2017 Images reviewed with her in detail showing very mild aortic atherosclerosis no significant coronary calcifications noted  02/2017 stress test Negative study , no ischemia Low risk study  Having occasional chest tightness comes on at rest or stress does not matter She does report blood pressure occasionally will run high again but has been better  EKG personally reviewed by myself on todays visit Shows sinus bradycardia rate 57 bpm nonspecific ST and T wave abnormality anterolateral leads   Of past medical history reviewed n noninvasive study by Dr. Wyn Quaker shows less than 39% bilateral carotid stenosis bilaterally Left subclavian markedly elevated velocities consistent with high-grade stenosis No stenosis on the right intervention of the left subclavian October 17, 2017   Family hx : HTN Mom and dad with COPD   PMH:   has a past medical history of Aorto-iliac atherosclerosis (HCC) (01/06/2016), Asthma, COPD (chronic obstructive pulmonary disease) (HCC), Hypertension, Tobacco abuse (01/06/2016), and Vitamin D deficiency (01/06/2016).  PSH:    Past Surgical History:  Procedure  Laterality Date  . ANKLE FRACTURE SURGERY Left 11/07/2009  . left ankle hardware removal    . left ankle surgery    . LOWER EXTREMITY ANGIOGRAPHY Left 10/17/2017   Procedure: LOWER EXTREMITY ANGIOGRAPHY;  Surgeon: Annice Needy, MD;  Location: [facility];  Service: Cardiovascular;  Laterality: Left;    Current Outpatient Medications  Medication Sig Dispense Refill  . amLODipine (NORVASC) 10 MG tablet Take 1 tablet (10 mg total) by mouth daily. 90 tablet 3  . aspirin EC 81 MG tablet Take by mouth.    Marland Kitchen atorvastatin (LIPITOR) 10 MG tablet Take 1 tablet (10 mg total) by mouth daily. 30 tablet 11  . carvedilol (COREG) 12.5 MG tablet Take 1 tablet (12.5 mg total) by mouth 2 (two) times daily with a meal. 60 tablet 3  . cloNIDine (CATAPRES) 0.1 MG tablet Take 1 tablet (0.1 mg total) by mouth 2 (two) times daily. 60 tablet 11  . clopidogrel (PLAVIX) 75 MG tablet Take 1 tablet (75 mg total) by mouth daily. 30 tablet 11  . gabapentin (NEURONTIN) 300 MG capsule Take 1 capsule (300 mg total) by mouth at bedtime. (Patient taking differently: Take 300 mg by mouth as needed. ) 30 capsule 0  . lisinopril (PRINIVIL,ZESTRIL) 40 MG tablet Take 1 tablet (40 mg total) by mouth daily. 90 tablet 4  . pantoprazole (PROTONIX) 40 MG tablet Take 1 tablet (40 mg total) by mouth daily. 30 tablet 3   No current facility-administered medications for this visit.      Allergies:   Vicodin [hydrocodone-acetaminophen]; Hct [hydrochlorothiazide]; and Tramadol   Social History:  The patient  reports that she has been smoking cigarettes.  She has a 30.00 pack-year smoking history. She has never used smokeless tobacco. She reports that she does not drink alcohol or use drugs.   Family History:   family history includes COPD in her father and mother; Heart failure in her father and mother.    Review of Systems: Review of Systems  Constitutional: Negative.   Respiratory: Negative.   Cardiovascular: Negative.    Gastrointestinal: Negative.   Musculoskeletal: Negative.   Neurological: Positive for dizziness.  Psychiatric/Behavioral: Negative.   All other systems reviewed and are negative.    PHYSICAL EXAM: VS:  BP (!) 84/60 (BP Location: Left Arm, Patient Position: Sitting, Cuff Size: Normal)   Pulse (!) 57   Ht 5\' 2"  (1.575 m)   Wt 162 lb 12 oz (73.8 kg)   LMP 08/09/2008 (Within Years)   BMI 29.77 kg/m  , BMI Body mass index is 29.77 kg/m. GEN: Well nourished, well developed, in no acute distress  HEENT: normal  Neck: no JVD, + carotid bruit on the left, no masses Cardiac: RRR; no murmurs, rubs, or gallops,no edema  Respiratory: Moderately decreased breath sounds throughout, normal work of breathing GI: soft, nontender, nondistended, + BS MS: no deformity or atrophy  Skin: warm and dry, no rash Neuro:  Strength and sensation are intact Psych: euthymic mood, full affect   Recent Labs: 02/27/2017: ALT 14; Hemoglobin 13.7; Platelets 282 06/16/2017: Potassium 3.8; Sodium 144 10/17/2017: BUN 11; Creatinine, Ser 0.84    Lipid Panel No results found for: CHOL, HDL, LDLCALC, TRIG    Wt Readings from Last 3 Encounters:  11/22/17 162 lb 12 oz (73.8 kg)  11/14/17 162 lb (73.5 kg)  10/27/17 167 lb 12.8 oz (76.1 kg)      ASSESSMENT AND PLAN:  HTN (hypertension), malignant -  Blood pressure markedly better on today's visit 110 systolic bilaterally Occasional lightheadedness, also with bradycardia Recommended she decrease the carvedilol down to 6.25 mg twice daily Continue to monitor blood pressure and call our office for any further orthostasis Weight down 5 pounds in the past month  PAD (peripheral artery disease) (HCC) Carotid disease, left subclavian stenosis with recent intervention Also with aortoiliac disease followed by vascular surgery Very mild descending aorta disease on CT scan, not much in the arch or ascending  Subclavian artery stenosis (HCC) previous intervention,   strong radial artery pulse bilaterally Left arm pain  resolved  Smoker We have encouraged her to continue to work on weaning her cigarettes and smoking cessation. She will continue to work on this and does not want any assistance with chantix.  Again discussed with her  Chronic bronchitis, unspecified chronic bronchitis type (HCC) smoking cessation  Disposition:   F/U  12 month   Total encounter time more than 25 minutes  Greater than 50% was spent in counseling and coordination of care with the patient    Orders Placed This Encounter  Procedures  . EKG 12-Lead     Signed, Dossie Arbourim Azriel Dancy, M.D., Ph.D. 11/22/2017  [facility] [place] [phone] -

## 2017-11-22 ENCOUNTER — Encounter: Payer: Self-pay | Admitting: Cardiovascular Disease

## 2017-11-22 ENCOUNTER — Ambulatory Visit (INDEPENDENT_AMBULATORY_CARE_PROVIDER_SITE_OTHER): Payer: Self-pay | Admitting: Cardiovascular Disease

## 2017-11-22 VITALS — BP 110/70 | HR 57 | Ht 62.0 in | Wt 162.8 lb

## 2017-11-22 DIAGNOSIS — I708 Atherosclerosis of other arteries: Secondary | ICD-10-CM

## 2017-11-22 DIAGNOSIS — I10 Essential (primary) hypertension: Secondary | ICD-10-CM

## 2017-11-22 DIAGNOSIS — I70299 Other atherosclerosis of native arteries of extremities, unspecified extremity: Secondary | ICD-10-CM

## 2017-11-22 DIAGNOSIS — I6523 Occlusion and stenosis of bilateral carotid arteries: Secondary | ICD-10-CM

## 2017-11-22 DIAGNOSIS — Z720 Tobacco use: Secondary | ICD-10-CM

## 2017-11-22 DIAGNOSIS — I700 Atherosclerosis of aorta: Secondary | ICD-10-CM

## 2017-11-22 DIAGNOSIS — J42 Unspecified chronic bronchitis: Secondary | ICD-10-CM

## 2017-11-22 DIAGNOSIS — I771 Stricture of artery: Secondary | ICD-10-CM

## 2017-11-22 DIAGNOSIS — I739 Peripheral vascular disease, unspecified: Secondary | ICD-10-CM | POA: Insufficient documentation

## 2017-11-22 MED ORDER — CARVEDILOL 6.25 MG PO TABS: 6.2500 mg | ORAL_TABLET | Freq: Two times a day (BID) | ORAL | 3 refills | Status: DC

## 2017-11-22 NOTE — Patient Instructions (Addendum)
Medication Instructions:  Your physician has recommended you make the following change in your medication:  1. DECREASE Carvedilol to 6.25 mg twice a day  Your physician has requested that you regularly monitor and record your blood pressure readings at home. Please use the same machine at the same time of day to check your readings and record them to bring to your follow-up visit.   Labwork:  [facility]. Make sure not to eat or drink anything after midnight except small sip of water with your pills.   Testing/Procedures:  No further testing at this time   Follow-Up: It was a pleasure seeing you in the office today. Please call us if you have new issues that need to be addressed before your next appt.  [phone]  Your physician wants you to follow-up in: 12 months.  You will receive a reminder letter in the mail two months in advance. If you don't receive a letter, please call our office to schedule the follow-up appointment.  If you need a refill on your cardiac medications before your next appointment, please call your pharmacy.  For educational health videos Log in to : [email or web address] Or : [email or web address], password : [email or web address]

## 2017-11-28 ENCOUNTER — Ambulatory Visit: Payer: Self-pay | Admitting: Cardiovascular Disease

## 2017-11-29 ENCOUNTER — Ambulatory Visit: Payer: Self-pay | Admitting: Adult Health

## 2017-11-29 ENCOUNTER — Other Ambulatory Visit
Admission: RE | Admit: 2017-11-29 | Discharge: 2017-11-29 | Disposition: A | Discharge status: Home / Self Care | Payer: Self-pay | Source: Ambulatory Visit | Attending: Cardiovascular Disease | Admitting: Cardiovascular Disease

## 2017-11-29 VITALS — BP 179/99 | HR 63 | Temp 97.3°F | Wt 163.6 lb

## 2017-11-29 DIAGNOSIS — Z720 Tobacco use: Secondary | ICD-10-CM

## 2017-11-29 DIAGNOSIS — E279 Disorder of adrenal gland, unspecified: Secondary | ICD-10-CM

## 2017-11-29 DIAGNOSIS — I10 Essential (primary) hypertension: Secondary | ICD-10-CM

## 2017-11-29 DIAGNOSIS — I771 Stricture of artery: Secondary | ICD-10-CM | POA: Insufficient documentation

## 2017-11-29 DIAGNOSIS — I739 Peripheral vascular disease, unspecified: Secondary | ICD-10-CM | POA: Insufficient documentation

## 2017-11-29 DIAGNOSIS — I700 Atherosclerosis of aorta: Secondary | ICD-10-CM | POA: Insufficient documentation

## 2017-11-29 DIAGNOSIS — I708 Atherosclerosis of other arteries: Secondary | ICD-10-CM

## 2017-11-29 DIAGNOSIS — E278 Other specified disorders of adrenal gland: Secondary | ICD-10-CM

## 2017-11-29 DIAGNOSIS — I70299 Other atherosclerosis of native arteries of extremities, unspecified extremity: Secondary | ICD-10-CM | POA: Insufficient documentation

## 2017-11-29 DIAGNOSIS — G3281 Cerebellar ataxia in diseases classified elsewhere: Secondary | ICD-10-CM

## 2017-11-29 LAB — HEPATIC FUNCTION PANEL
ALBUMIN: 3.4 g/dL — AB (ref 3.5–5.0)
ALK PHOS: 87 U/L (ref 38–126)
ALT: 16 U/L (ref 14–54)
AST: 19 U/L (ref 15–41)
Bilirubin, Direct: <0.1 mg/dL — ABNORMAL LOW (ref 0.1–0.5)
TOTAL PROTEIN: 6.6 g/dL (ref 6.5–8.1)
Total Bilirubin: 0.3 mg/dL (ref 0.3–1.2)

## 2017-11-29 LAB — LIPID PANEL
CHOL/HDL RATIO: 3.5 ratio
Cholesterol: 131 mg/dL (ref 0–200)
HDL: 37 mg/dL — ABNORMAL LOW (ref >40)
LDL Cholesterol: 52 mg/dL (ref 0–99)
Triglycerides: 212 mg/dL — ABNORMAL HIGH (ref <150)
VLDL: 42 mg/dL — ABNORMAL HIGH (ref 0–40)

## 2017-11-29 NOTE — Progress Notes (Signed)
Patient: Sheila [name] Female    DOB: Nov 29, 1960   57 y.o.   MRN: [id number] Visit Date: 11/29/2017  Today's Provider: [facility]   Chief Complaint  Patient presents with  . Follow-up    blood pressure   Subjective:    HPI 57 y/o female who presents for f/u of hypertension. Her blood pressure readings continue to fluctuate between normal and extremely high. The highest reading she got at home was 200.She was seen by cardiology on 11/22/17. Her blood pressure was normal. She reports 4-5 episodes of hypotension. States that she gets really sleep when her blood pressure drops. She takes amlodipine, coreg, clonidine and lisinopril. She reports taking all medications as prescribed.   Allergies  Allergen Reactions  . Vicodin [Hydrocodone-Acetaminophen] Nausea And Vomiting  . Hct [Hydrochlorothiazide] Palpitations  . Tramadol Palpitations   Previous Medications   AMLODIPINE (NORVASC) 10 MG TABLET    Take 1 tablet (10 mg total) by mouth daily.   ASPIRIN EC 81 MG TABLET    Take by mouth.   ATORVASTATIN (LIPITOR) 10 MG TABLET    Take 1 tablet (10 mg total) by mouth daily.   CARVEDILOL (COREG) 6.25 MG TABLET    Take 1 tablet (6.25 mg total) by mouth 2 (two) times daily.   CLONIDINE (CATAPRES) 0.1 MG TABLET    Take 1 tablet (0.1 mg total) by mouth 2 (two) times daily.   CLOPIDOGREL (PLAVIX) 75 MG TABLET    Take 1 tablet (75 mg total) by mouth daily.   GABAPENTIN (NEURONTIN) 300 MG CAPSULE    TAKE ONE CAPSULE BY MOUTH AT BEDTIME   LISINOPRIL (PRINIVIL,ZESTRIL) 40 MG TABLET    Take 1 tablet (40 mg total) by mouth daily.   PANTOPRAZOLE (PROTONIX) 40 MG TABLET    Take 1 tablet (40 mg total) by mouth daily.    Review of Systems  Constitutional: Negative.   HENT: Negative.   Eyes: Negative.   Respiratory: Negative.   Cardiovascular: Negative.   Musculoskeletal: Negative.   Skin: Negative.   Neurological: Negative.     Social History   Tobacco Use  . Smoking status: Current  Every Day Smoker    Packs/day: 1.00    Years: 30.00    Pack years: 30.00    Types: Cigarettes  . Smokeless tobacco: Never Used  Substance Use Topics  . Alcohol use: No    Alcohol/week: 0.0 oz   Objective:   BP (!) 179/99   Pulse 63   Temp (!) 97.3 F (36.3 C)   Wt 163 lb 9.6 oz (74.2 kg)   LMP 08/09/2008 (Within Years)   BMI 29.92 kg/m   Physical Exam  Constitutional: She is oriented to person, place, and time. She appears well-developed and well-nourished.  Eyes: Pupils are equal, round, and reactive to light.  Neck: Normal range of motion. Neck supple.  Cardiovascular: Normal rate, regular rhythm and normal heart sounds.  diminished pulses, +1edema  Pulmonary/Chest: Effort normal and breath sounds normal.  Abdominal: Soft. Bowel sounds are normal.  Musculoskeletal: Normal range of motion.  Neurological: She is alert and oriented to person, place, and time.  Skin: Skin is warm and dry.      Assessment & Plan:     1. Malignant hypertension Uncontrolled with wide fluctuations from normal to high. Patient is on amlodipine, coreg, lisinopril and clonidine. Will adjust medications times to ensure appropriate AM and PM coverage. Patient advised to monitor and keep a log of blood pressures every  morning and evening and to call the clinic on Monday with blood pressure readings. She is to RTC in 1 week for re-evaluaton. Advised to go to the ED if SBP consistently >180   2. Tobacco abuse Smoking cessation advice given.Effects of smoking on blood pressure reviewed  [facility]   [facility]

## 2017-11-30 LAB — HEMOGLOBIN A1C
Est. average glucose Bld gHb Est-mCnc: 114 mg/dL
Hgb A1c MFr Bld: 5.6 % (ref 4.8–5.6)

## 2017-12-01 ENCOUNTER — Other Ambulatory Visit (INDEPENDENT_AMBULATORY_CARE_PROVIDER_SITE_OTHER): Payer: Self-pay | Admitting: Vascular Surgery

## 2017-12-01 DIAGNOSIS — G3281 Cerebellar ataxia in diseases classified elsewhere: Secondary | ICD-10-CM | POA: Insufficient documentation

## 2017-12-01 DIAGNOSIS — I771 Stricture of artery: Secondary | ICD-10-CM

## 2017-12-02 ENCOUNTER — Ambulatory Visit (INDEPENDENT_AMBULATORY_CARE_PROVIDER_SITE_OTHER): Payer: Self-pay

## 2017-12-02 ENCOUNTER — Encounter (INDEPENDENT_AMBULATORY_CARE_PROVIDER_SITE_OTHER): Payer: Self-pay | Admitting: Vascular Surgery

## 2017-12-02 ENCOUNTER — Ambulatory Visit (INDEPENDENT_AMBULATORY_CARE_PROVIDER_SITE_OTHER): Payer: Self-pay | Admitting: Vascular Surgery

## 2017-12-02 VITALS — BP 200/107 | HR 66 | Resp 13 | Ht 62.0 in | Wt 158.0 lb

## 2017-12-02 DIAGNOSIS — I6523 Occlusion and stenosis of bilateral carotid arteries: Secondary | ICD-10-CM

## 2017-12-02 DIAGNOSIS — Z720 Tobacco use: Secondary | ICD-10-CM

## 2017-12-02 DIAGNOSIS — I771 Stricture of artery: Secondary | ICD-10-CM

## 2017-12-02 DIAGNOSIS — I10 Essential (primary) hypertension: Secondary | ICD-10-CM

## 2017-12-02 NOTE — Assessment & Plan Note (Signed)
Duplex today shows normal triphasic waveforms throughout the left upper extremity. Doing well after intervention.  Symptoms markedly improved.  Continue Plavix, aspirin, and Lipitor.  Recheck in 3 months

## 2017-12-02 NOTE — Assessment & Plan Note (Signed)

## 2017-12-02 NOTE — Progress Notes (Signed)
MRN : [id number]  Sheila [name] is a 57 y.o. (Sep 24, 1960) female who presents with chief complaint of  Chief Complaint  Patient presents with  . Follow-up    Left arterial duplex  .  History of Present Illness: Patient returns today in follow up of left subclavian artery angioplasty performed about a month ago.  She reports that the next day her arm felt dramatically better.  She is having almost no pain in the left arm.  She feels much better and is much pleased with the results.  No periprocedural complications.  Duplex today shows normal triphasic waveforms throughout the left upper extremity.  Current Outpatient Medications  Medication Sig Dispense Refill  . amLODipine (NORVASC) 10 MG tablet Take 1 tablet (10 mg total) by mouth daily. 90 tablet 3  . aspirin EC 81 MG tablet Take by mouth.    Marland Kitchen atorvastatin (LIPITOR) 10 MG tablet Take 1 tablet (10 mg total) by mouth daily. 30 tablet 11  . carvedilol (COREG) 6.25 MG tablet Take 1 tablet (6.25 mg total) by mouth 2 (two) times daily. 180 tablet 3  . cloNIDine (CATAPRES) 0.1 MG tablet Take 1 tablet (0.1 mg total) by mouth 2 (two) times daily. 60 tablet 11  . clopidogrel (PLAVIX) 75 MG tablet Take 1 tablet (75 mg total) by mouth daily. 30 tablet 11  . gabapentin (NEURONTIN) 300 MG capsule TAKE ONE CAPSULE BY MOUTH AT BEDTIME 30 capsule 0  . lisinopril (PRINIVIL,ZESTRIL) 40 MG tablet Take 1 tablet (40 mg total) by mouth daily. 90 tablet 4  . pantoprazole (PROTONIX) 40 MG tablet Take 1 tablet (40 mg total) by mouth daily. 30 tablet 3   No current facility-administered medications for this visit.     Past Medical History:  Diagnosis Date  . Aorto-iliac atherosclerosis ([place]) 01/06/2016  . Asthma   . COPD (chronic obstructive pulmonary disease) ([place])   . Hypertension   . Tobacco abuse 01/06/2016  . Vitamin D deficiency 01/06/2016    Past Surgical History:  Procedure Laterality Date  . ANKLE FRACTURE SURGERY Left 11/07/2009  . left  ankle hardware removal    . left ankle surgery    . LOWER EXTREMITY ANGIOGRAPHY Left 10/17/2017   Procedure: LOWER EXTREMITY ANGIOGRAPHY;  Surgeon: Algernon Huxley, MD;  Location: [facility];  Service: Cardiovascular;  Laterality: Left;    Social History Social History        Tobacco Use  . Smoking status: Current Every Day Smoker    Packs/day: 1.00    Years: 30.00    Pack years: 30.00    Types: Cigarettes  . Smokeless tobacco: Never Used  Substance Use Topics  . Alcohol use: No    Alcohol/week: 0.0 oz  . Drug use: No     Family History      Family History  Problem Relation Age of Onset  . COPD Mother   . Heart failure Mother   . COPD Father   . Heart failure Father          Allergies  Allergen Reactions  . Vicodin [Hydrocodone-Acetaminophen] Nausea And Vomiting  . Hct [Hydrochlorothiazide] Palpitations  . Tramadol Palpitations     REVIEW OF SYSTEMS (Negative unless checked)  Constitutional: '[]'$ Weight loss  '[]'$ Fever  '[]'$ Chills Cardiac: '[]'$ Chest pain   '[]'$ Chest pressure   '[]'$ Palpitations   '[]'$ Shortness of breath when laying flat   '[]'$ Shortness of breath at rest   '[]'$ Shortness of breath with exertion. Vascular:  '[]'$ Pain in legs  with walking   '[]'$ Pain in legs at rest   '[]'$ Pain in legs when laying flat   '[]'$ Claudication   '[]'$ Pain in feet when walking  '[]'$ Pain in feet at rest  '[]'$ Pain in feet when laying flat   '[]'$ History of DVT   '[]'$ Phlebitis   '[]'$ Swelling in legs   '[]'$ Varicose veins   '[]'$ Non-healing ulcers Pulmonary:   '[]'$ Uses home oxygen   '[]'$ Productive cough   '[]'$ Hemoptysis   '[]'$ Wheeze  '[]'$ COPD   '[]'$ Asthma Neurologic:  '[x]'$ Dizziness  '[]'$ Blackouts   '[]'$ Seizures   '[]'$ History of stroke   '[]'$ History of TIA  '[]'$ Aphasia   '[]'$ Temporary blindness   '[]'$ Dysphagia   '[x]'$ Weakness or numbness in arms   '[]'$ Weakness or numbness in legs Musculoskeletal:  '[x]'$ Arthritis   '[]'$ Joint swelling   '[x]'$ Joint pain   '[]'$ Low back pain Hematologic:  '[]'$ Easy bruising  '[]'$ Easy bleeding   '[]'$ Hypercoagulable  state   '[]'$ Anemic   Gastrointestinal:  '[]'$ Blood in stool   '[]'$ Vomiting blood  '[]'$ Gastroesophageal reflux/heartburn   '[]'$ Abdominal pain Genitourinary:  '[]'$ Chronic kidney disease   '[]'$ Difficult urination  '[]'$ Frequent urination  '[]'$ Burning with urination   '[]'$ Hematuria Skin:  '[]'$ Rashes   '[]'$ Ulcers   '[]'$ Wounds Psychological:  '[]'$ History of anxiety   '[]'$  History of major depression.    Physical Examination  BP (!) 200/107 (BP Location: Right Arm, Patient Position: Sitting)   Pulse 66   Resp 13   Ht '5\' 2"'$  (1.575 m)   Wt 71.7 kg (158 lb)   LMP 08/09/2008 (Within Years)   BMI 28.90 kg/m  Gen:  WD/WN, NAD.  Appears older than stated age Head: [place]/AT, No temporalis wasting. Ear/Nose/Throat: Hearing grossly intact, nares w/o erythema or drainage Eyes: Conjunctiva clear. Sclera non-icteric Neck: Supple.  Trachea midline Pulmonary:  Good air movement, no use of accessory muscles.  Cardiac: RRR, no JVD Vascular:  Vessel Right Left  Radial Palpable Palpable                                    Musculoskeletal: M/S 5/5 throughout.  No deformity or atrophy.  No significant edema. Neurologic: Sensation grossly intact in extremities.  Symmetrical.  Speech is fluent.  Psychiatric: Judgment intact, Mood & affect appropriate for pt's clinical situation. Dermatologic: No rashes or ulcers noted.  No cellulitis or open wounds.       Labs Recent Results (from the past 2160 hour(s))  BUN     Status: None   Collection Time: 10/17/17  8:21 AM  Result Value Ref Range   BUN 11 6 - 20 mg/dL    Comment: Performed at [facility], [address] [place], [place] [address]  Creatinine, serum     Status: None   Collection Time: 10/17/17  8:21 AM  Result Value Ref Range   Creatinine, Ser 0.84 0.44 - 1.00 mg/dL   GFR calc non Af Amer >60 >60 mL/min   GFR calc Af Amer >60 >60 mL/min    Comment: (NOTE) The eGFR has been calculated using the CKD EPI equation. This calculation has not been validated  in all clinical situations. eGFR's persistently <60 mL/min signify possible Chronic Kidney Disease. Performed at [facility], [address] [place], [place] [address]   Lipid Profile     Status: Abnormal   Collection Time: 11/29/17  8:25 AM  Result Value Ref Range   Cholesterol 131 0 - 200 mg/dL   Triglycerides 212 (H) <150 mg/dL  HDL 37 (L) >40 mg/dL   Total CHOL/HDL Ratio 3.5 RATIO   VLDL 42 (H) 0 - 40 mg/dL   LDL Cholesterol 52 0 - 99 mg/dL    Comment:        Total Cholesterol/HDL:CHD Risk Coronary Heart Disease Risk Table                     Men   Women  1/2 Average Risk   3.4   3.3  Average Risk       5.0   4.4  2 X Average Risk   9.6   7.1  3 X Average Risk  23.4   11.0        Use the calculated Patient Ratio above and the CHD Risk Table to determine the patient's CHD Risk.        ATP III CLASSIFICATION (LDL):  <100     mg/dL   Optimal  100-129  mg/dL   Near or Above                    Optimal  130-159  mg/dL   Borderline  160-189  mg/dL   High  >190     mg/dL   Very High Performed at [facility], [address] [place], [place] [address]   Hepatic function panel     Status: Abnormal   Collection Time: 11/29/17  8:25 AM  Result Value Ref Range   Total Protein 6.6 6.5 - 8.1 g/dL   Albumin 3.4 (L) 3.5 - 5.0 g/dL   AST 19 15 - 41 U/L   ALT 16 14 - 54 U/L   Alkaline Phosphatase 87 38 - 126 U/L   Total Bilirubin 0.3 0.3 - 1.2 mg/dL   Bilirubin, Direct <0.1 (L) 0.1 - 0.5 mg/dL   Indirect Bilirubin NOT CALCULATED 0.3 - 0.9 mg/dL    Comment: Performed at [facility], [address] [place], [place] [address]  HgB A1c     Status: None   Collection Time: 11/29/17  8:03 PM  Result Value Ref Range   Hgb A1c MFr Bld 5.6 4.8 - 5.6 %    Comment:          Prediabetes: 5.7 - 6.4          Diabetes: >6.4          Glycemic control for adults with diabetes: <7.0    Est. average glucose Bld gHb Est-mCnc 114 mg/dL    Radiology No  results found.  Assessment/Plan Essential hypertension blood pressure control important in reducing the progression of atherosclerotic disease. On appropriate oral medications.   Carotid stenosis Mild.  1-39% bilaterally.  Continue aspirin.   Tobacco abuse We had a discussion for approximately 3 minutes regarding the absolute need for smoking cessation due to the deleterious nature of tobacco on the vascular system. We discussed the tobacco use would diminish patency of any intervention, and likely significantly worsen progressio of disease. We discussed multiple agents for quitting including replacement therapy or medications to reduce cravings such as Chantix. The patient voices their understanding of the importance of smoking cessation.   Subclavian artery stenosis (HCC) Duplex today shows normal triphasic waveforms throughout the left upper extremity. Doing well after intervention.  Symptoms markedly improved.  Continue Plavix, aspirin, and Lipitor.  Recheck in 3 months    Leotis Pain, MD  12/02/2017 9:11 AM    This note was created with Dragon medical transcription system.  Any errors from dictation are purely unintentional

## 2017-12-06 ENCOUNTER — Ambulatory Visit: Payer: Self-pay | Admitting: Adult Health Nurse Practitioner

## 2017-12-06 VITALS — BP 163/97 | HR 80 | Temp 97.4°F | Wt 162.0 lb

## 2017-12-06 DIAGNOSIS — I10 Essential (primary) hypertension: Secondary | ICD-10-CM

## 2017-12-06 MED ORDER — CLONIDINE HCL 0.2 MG PO TABS: 0.1000 mg | ORAL_TABLET | Freq: Two times a day (BID) | ORAL | 0 refills | Status: DC

## 2017-12-06 NOTE — Progress Notes (Signed)
Subjective:    Patient ID: Sheila [name], female    DOB: 08/24/1960, 57 y.o.   MRN: [id number]  HPI  Sheila [name] is a 57 yo female here for a BP check. Her BP is elevated 163/97 but is down from last visit of 179/99. At last visit her med times were adjusted to ensure appropriate coverage. She self-reports her BP log from 140-170 systolic. She endorses chest pain but not out of the ordinary.  Pt reports loss of family member last night.   Patient Active Problem List   Diagnosis Date Noted  . 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 PAD (peripheral artery disease) (HCC) 11/22/2017  . Malignant hypertension 10/20/2017  . Carotid stenosis 10/14/2017  . Subclavian artery stenosis (HCC) 08/11/2017  . Dizziness 03/08/2017  . Left sided numbness 03/08/2017  . Solitary pulmonary nodule 03/08/2017  . GERD (gastroesophageal reflux disease) 03/08/2017  . Chest pain 02/26/2017  . Abnormal EKG 02/26/2017  . Hypokalemia 02/26/2017  . Urge incontinence of urine 05/04/2016  . Recurrent UTI 03/02/2016  . COPD (chronic obstructive pulmonary disease) (HCC) 01/06/2016  . Adrenal nodule (HCC) 01/06/2016  . Tobacco abuse 01/06/2016  . Aorto-iliac atherosclerosis (HCC) 01/06/2016  . Vitamin D deficiency 01/06/2016  . Lower abdominal pain 01/06/2016  . Myopia of both eyes 01/06/2016  . Hypercalcemia 01/06/2016  . Essential hypertension 12/11/2015  . Pyelonephritis 12/03/2015  . Infection and inflammatory reaction due to other internal orthopedic device, implant, and graft 02/11/2012   Allergies as of 12/06/2017      Reactions   Vicodin [hydrocodone-acetaminophen] Nausea And Vomiting   Hct [hydrochlorothiazide] Palpitations   Tramadol Palpitations      Medication List        Accurate as of 12/06/17  6:28 PM. Always use your most recent med list.          amLODipine 10 MG tablet Commonly known as:  NORVASC Take 1 tablet (10 mg total) by mouth daily.     aspirin EC 81 MG tablet Take by mouth.   atorvastatin 10 MG tablet Commonly known as:  LIPITOR Take 1 tablet (10 mg total) by mouth daily.   carvedilol 6.25 MG tablet Commonly known as:  COREG Take 1 tablet (6.25 mg total) by mouth 2 (two) times daily.   cloNIDine 0.1 MG tablet Commonly known as:  CATAPRES Take 1 tablet (0.1 mg total) by mouth 2 (two) times daily.   clopidogrel 75 MG tablet Commonly known as:  PLAVIX Take 1 tablet (75 mg total) by mouth daily.   gabapentin 300 MG capsule Commonly known as:  NEURONTIN TAKE ONE CAPSULE BY MOUTH AT BEDTIME   lisinopril 40 MG tablet Commonly known as:  PRINIVIL,ZESTRIL Take 1 tablet (40 mg total) by mouth daily.   pantoprazole 40 MG tablet Commonly known as:  PROTONIX Take 1 tablet (40 mg total) by mouth daily.        Review of Systems Cardio recently decreased Coreg to 6.25mg  BID    Objective:   Physical Exam  Constitutional: She is oriented to person, place, and time. She appears well-developed and well-nourished.  Cardiovascular: Normal rate, regular rhythm and normal heart sounds.  Pulmonary/Chest: Effort normal. She has decreased breath sounds.  Abdominal: Soft. Bowel sounds are normal.  Neurological: She is alert and oriented to person, place, and time.  Vitals reviewed.   BP (!) 163/97 (BP Location: Right Arm)   Pulse 80   Temp (!) 97.4 F (36.3 C)  Wt 162 lb (73.5 kg)   LMP 08/09/2008 (Within Years)   BMI 29.63 kg/m        Assessment & Plan:   HTN:  Uncontrolled.  Increase Clonidine to 2 tablets of 0.1mg  for 0.2mg  BID  F/u in 2 weeks for BP check and med eval

## 2017-12-20 ENCOUNTER — Ambulatory Visit: Payer: Self-pay | Admitting: Family Medicine

## 2017-12-20 VITALS — BP 189/89 | HR 66 | Temp 97.5°F | Wt 161.2 lb

## 2017-12-20 DIAGNOSIS — Z720 Tobacco use: Secondary | ICD-10-CM

## 2017-12-20 DIAGNOSIS — I700 Atherosclerosis of aorta: Secondary | ICD-10-CM

## 2017-12-20 DIAGNOSIS — I708 Atherosclerosis of other arteries: Secondary | ICD-10-CM

## 2017-12-20 DIAGNOSIS — I10 Essential (primary) hypertension: Secondary | ICD-10-CM

## 2017-12-20 DIAGNOSIS — R0781 Pleurodynia: Secondary | ICD-10-CM

## 2017-12-20 DIAGNOSIS — R911 Solitary pulmonary nodule: Secondary | ICD-10-CM

## 2017-12-20 MED ORDER — CLONIDINE HCL 0.2 MG PO TABS: 0.2000 mg | ORAL_TABLET | Freq: Three times a day (TID) | ORAL | 2 refills | Status: DC

## 2017-12-20 NOTE — Assessment & Plan Note (Signed)
CT scan due in July 2019 per CT report; will order today

## 2017-12-20 NOTE — Assessment & Plan Note (Signed)
Urged smoking cessation; she is not ready to quit; explained we are here to help if / when ready; see AVS

## 2017-12-20 NOTE — Patient Instructions (Addendum)
Please increase your clonidine to one pill every 8 hours (three times a day) Continue your other blood pressure pills Have an xray done at the hospital soon (no appointment needed) Tylenol per package directions; maximum of 3,000 mg per day Try turmeric as a natural anti-inflammatory (for pain and arthritis). It comes in capsules where you buy aspirin and fish oil, but also as a spice where you buy pepper and garlic powder. Topicals like Biofreeze or aspercreme may give you some relief Do take some nice deep gentle breaths every hour to prevent pneumonia  Try to follow the DASH guidelines (DASH stands for Dietary Approaches to Stop Hypertension). Try to limit the sodium in your diet to no more than 1,500mg  of sodium per day. Certainly try to not exceed 2,000 mg per day at the very most. Do not add salt when cooking or at the table.  Check the sodium amount on labels when shopping, and choose items lower in sodium when given a choice. Avoid or limit foods that already contain a lot of sodium. Eat a diet rich in fruits and vegetables and whole grains, and try to lose weight if overweight or obese  Please call [phone] to schedule your imaging test for JULY Please wait 2-3 days after the order has been placed to call and get your test scheduled  I do encourage you to quit smoking Call [phone] to sign up for smoking cessation classes You can call 1-800-QUIT-NOW to talk with a smoking cessation coach   DASH Eating Plan DASH stands for "Dietary Approaches to Stop Hypertension." The DASH eating plan is a healthy eating plan that has been shown to reduce high blood pressure (hypertension). It may also reduce your risk for type 2 diabetes, heart disease, and stroke. The DASH eating plan may also help with weight loss. What are tips for following this plan? General guidelines  Avoid eating more than 2,300 mg (milligrams) of salt (sodium) a day. If you have hypertension, you may need to reduce  your sodium intake to 1,500 mg a day.  Limit alcohol intake to no more than 1 drink a day for nonpregnant women and 2 drinks a day for men. One drink equals 12 oz of beer, 5 oz of wine, or 1 oz of hard liquor.  Work with your health care provider to maintain a healthy body weight or to lose weight. Ask what an ideal weight is for you.  Get at least 30 minutes of exercise that causes your heart to beat faster (aerobic exercise) most days of the week. Activities may include walking, swimming, or biking.  Work with your health care provider or diet and nutrition specialist (dietitian) to adjust your eating plan to your individual calorie needs. Reading food labels  Check food labels for the amount of sodium per serving. Choose foods with less than 5 percent of the Daily Value of sodium. Generally, foods with less than 300 mg of sodium per serving fit into this eating plan.  To find whole grains, look for the word "whole" as the first word in the ingredient list. Shopping  Buy products labeled as "low-sodium" or "no salt added."  Buy fresh foods. Avoid canned foods and premade or frozen meals. Cooking  Avoid adding salt when cooking. Use salt-free seasonings or herbs instead of table salt or sea salt. Check with your health care provider or pharmacist before using salt substitutes.  Do not fry foods. Cook foods using healthy methods such as baking, boiling, grilling,  and broiling instead.  Cook with heart-healthy oils, such as olive, canola, soybean, or sunflower oil. Meal planning   Eat a balanced diet that includes: ? 5 or more servings of fruits and vegetables each day. At each meal, try to fill half of your plate with fruits and vegetables. ? Up to 6-8 servings of whole grains each day. ? Less than 6 oz of lean meat, poultry, or fish each day. A 3-oz serving of meat is about the same size as a deck of cards. One egg equals 1 oz. ? 2 servings of low-fat dairy each day. ? A serving  of nuts, seeds, or beans 5 times each week. ? Heart-healthy fats. Healthy fats called Omega-3 fatty acids are found in foods such as flaxseeds and coldwater fish, like sardines, salmon, and mackerel.  Limit how much you eat of the following: ? Canned or prepackaged foods. ? Food that is high in trans fat, such as fried foods. ? Food that is high in saturated fat, such as fatty meat. ? Sweets, desserts, sugary drinks, and other foods with added sugar. ? Full-fat dairy products.  Do not salt foods before eating.  Try to eat at least 2 vegetarian meals each week.  Eat more home-cooked food and less restaurant, buffet, and fast food.  When eating at a restaurant, ask that your food be prepared with less salt or no salt, if possible. What foods are recommended? The items listed may not be a complete list. Talk with your dietitian about what dietary choices are best for you. Grains Whole-grain or whole-wheat bread. Whole-grain or whole-wheat pasta. Brown rice. Orpah Cobb. Bulgur. Whole-grain and low-sodium cereals. Pita bread. Low-fat, low-sodium crackers. Whole-wheat flour tortillas. Vegetables Fresh or frozen vegetables (raw, steamed, roasted, or grilled). Low-sodium or reduced-sodium tomato and vegetable juice. Low-sodium or reduced-sodium tomato sauce and tomato paste. Low-sodium or reduced-sodium canned vegetables. Fruits All fresh, dried, or frozen fruit. Canned fruit in natural juice (without added sugar). Meat and other protein foods Skinless chicken or [place]. Ground chicken or [place]. Pork with fat trimmed off. Fish and seafood. Egg whites. Dried beans, peas, or lentils. Unsalted nuts, nut butters, and seeds. Unsalted canned beans. Lean cuts of beef with fat trimmed off. Low-sodium, lean deli meat. Dairy Low-fat (1%) or fat-free (skim) milk. Fat-free, low-fat, or reduced-fat cheeses. Nonfat, low-sodium ricotta or cottage cheese. Low-fat or nonfat yogurt. Low-fat, low-sodium  cheese. Fats and oils Soft margarine without trans fats. Vegetable oil. Low-fat, reduced-fat, or light mayonnaise and salad dressings (reduced-sodium). Canola, safflower, olive, soybean, and sunflower oils. Avocado. Seasoning and other foods Herbs. Spices. Seasoning mixes without salt. Unsalted popcorn and pretzels. Fat-free sweets. What foods are not recommended? The items listed may not be a complete list. Talk with your dietitian about what dietary choices are best for you. Grains Baked goods made with fat, such as croissants, muffins, or some breads. Dry pasta or rice meal packs. Vegetables Creamed or fried vegetables. Vegetables in a cheese sauce. Regular canned vegetables (not low-sodium or reduced-sodium). Regular canned tomato sauce and paste (not low-sodium or reduced-sodium). Regular tomato and vegetable juice (not low-sodium or reduced-sodium). Rosita Fire. Olives. Fruits Canned fruit in a light or heavy syrup. Fried fruit. Fruit in cream or butter sauce. Meat and other protein foods Fatty cuts of meat. Ribs. Fried meat. Tomasa Blase. Sausage. Bologna and other processed lunch meats. Salami. Fatback. Hotdogs. Bratwurst. Salted nuts and seeds. Canned beans with added salt. Canned or smoked fish. Whole eggs or egg yolks. Chicken or  [place] with skin. Dairy Whole or 2% milk, cream, and half-and-half. Whole or full-fat cream cheese. Whole-fat or sweetened yogurt. Full-fat cheese. Nondairy creamers. Whipped toppings. Processed cheese and cheese spreads. Fats and oils Butter. Stick margarine. Lard. Shortening. Ghee. Bacon fat. Tropical oils, such as coconut, palm kernel, or palm oil. Seasoning and other foods Salted popcorn and pretzels. Onion salt, garlic salt, seasoned salt, table salt, and sea salt. Worcestershire sauce. Tartar sauce. Barbecue sauce. Teriyaki sauce. Soy sauce, including reduced-sodium. Steak sauce. Canned and packaged gravies. Fish sauce. Oyster sauce. Cocktail sauce. Horseradish  that you find on the shelf. Ketchup. Mustard. Meat flavorings and tenderizers. Bouillon cubes. Hot sauce and Tabasco sauce. Premade or packaged marinades. Premade or packaged taco seasonings. Relishes. Regular salad dressings. Where to find more information:  National Heart, Lung, and Blood Institute: PopSteam.is  American Heart Association: www.heart.org Summary  The DASH eating plan is a healthy eating plan that has been shown to reduce high blood pressure (hypertension). It may also reduce your risk for type 2 diabetes, heart disease, and stroke.  With the DASH eating plan, you should limit salt (sodium) intake to 2,300 mg a day. If you have hypertension, you may need to reduce your sodium intake to 1,500 mg a day.  When on the DASH eating plan, aim to eat more fresh fruits and vegetables, whole grains, lean proteins, low-fat dairy, and heart-healthy fats.  Work with your health care provider or diet and nutrition specialist (dietitian) to adjust your eating plan to your individual calorie needs. This information is not intended to replace advice given to you by your health care provider. Make sure you discuss any questions you have with your health care provider. Document Released: 07/15/2011 Document Revised: 07/19/2016 Document Reviewed: 07/19/2016 Elsevier Interactive Patient Education  2018 [employer].  Steps to Quit Smoking Smoking tobacco can be bad for your health. It can also affect almost every organ in your body. Smoking puts you and people around you at risk for many serious long-lasting (chronic) diseases. Quitting smoking is hard, but it is one of the best things that you can do for your health. It is never too late to quit. What are the benefits of quitting smoking? When you quit smoking, you lower your risk for getting serious diseases and conditions. They can include:  Lung cancer or lung disease.  Heart disease.  Stroke.  Heart attack.  Not being able to  have children (infertility).  Weak bones (osteoporosis) and broken bones (fractures).  If you have coughing, wheezing, and shortness of breath, those symptoms may get better when you quit. You may also get sick less often. If you are pregnant, quitting smoking can help to lower your chances of having a baby of low birth weight. What can I do to help me quit smoking? Talk with your doctor about what can help you quit smoking. Some things you can do (strategies) include:  Quitting smoking totally, instead of slowly cutting back how much you smoke over a period of time.  Going to in-person counseling. You are more likely to quit if you go to many counseling sessions.  Using resources and support systems, such as: ? Agricultural engineer with a Veterinary surgeon. ? Phone quitlines. ? Automotive engineer. ? Support groups or group counseling. ? Text messaging programs. ? Mobile phone apps or applications.  Taking medicines. Some of these medicines may have nicotine in them. If you are pregnant or breastfeeding, do not take any medicines to quit smoking unless  your doctor says it is okay. Talk with your doctor about counseling or other things that can help you.  Talk with your doctor about using more than one strategy at the same time, such as taking medicines while you are also going to in-person counseling. This can help make quitting easier. What things can I do to make it easier to quit? Quitting smoking might feel very hard at first, but there is a lot that you can do to make it easier. Take these steps:  Talk to your family and friends. Ask them to support and encourage you.  Call phone quitlines, reach out to support groups, or work with a Veterinary surgeon.  Ask people who smoke to not smoke around you.  Avoid places that make you want (trigger) to smoke, such as: ? Bars. ? Parties. ? Smoke-break areas at work.  Spend time with people who do not smoke.  Lower the stress in your life. Stress  can make you want to smoke. Try these things to help your stress: ? Getting regular exercise. ? Deep-breathing exercises. ? Yoga. ? Meditating. ? Doing a body scan. To do this, close your eyes, focus on one area of your body at a time from head to toe, and notice which parts of your body are tense. Try to relax the muscles in those areas.  Download or buy apps on your mobile phone or tablet that can help you stick to your quit plan. There are many free apps, such as QuitGuide from the [employer] developer for Disease Control and Prevention). You can find more support from smokefree.gov and other websites.  This information is not intended to replace advice given to you by your health care provider. Make sure you discuss any questions you have with your health care provider. Document Released: 05/22/2009 Document Revised: 03/23/2016 Document Reviewed: 12/10/2014 Elsevier Interactive Patient Education  2018 [employer].
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
## 2017-12-20 NOTE — Assessment & Plan Note (Signed)
Noted on scan; seeing vascular doctor; goal LDL less than 70, and her last lipid panel showed she is at target; urged smoking cessation

## 2017-12-20 NOTE — Assessment & Plan Note (Signed)
Increase the clonidine to three times a day; stop smoking; follow DASH guidelines

## 2018-01-10 ENCOUNTER — Ambulatory Visit: Payer: Self-pay | Admitting: Family Medicine

## 2018-01-10 VITALS — BP 120/68 | HR 69 | Temp 97.4°F | Wt 160.7 lb

## 2018-01-10 DIAGNOSIS — Z09 Encounter for follow-up examination after completed treatment for conditions other than malignant neoplasm: Secondary | ICD-10-CM

## 2018-01-10 DIAGNOSIS — I10 Essential (primary) hypertension: Secondary | ICD-10-CM

## 2018-01-10 DIAGNOSIS — R0781 Pleurodynia: Secondary | ICD-10-CM

## 2018-01-10 DIAGNOSIS — N951 Menopausal and female climacteric states: Secondary | ICD-10-CM

## 2018-01-10 DIAGNOSIS — Z720 Tobacco use: Secondary | ICD-10-CM

## 2018-01-10 DIAGNOSIS — R0789 Other chest pain: Secondary | ICD-10-CM

## 2018-01-10 DIAGNOSIS — R42 Dizziness and giddiness: Secondary | ICD-10-CM

## 2018-01-10 NOTE — Progress Notes (Signed)
Subjective:    Patient ID: Sheila [name], female    DOB: 09/14/1960, 57 y.o.   MRN: [id number]   PCP: Raliegh IpNatalie Dsean Vantol, NP  Chief Complaint  Patient presents with  . Hypertension     HPI  Sheila [name] has a medical history of Vitamin D Deficiency, Tobacco Use, Hypertension, COPD, and Asthma. She is here today and follow up of left rib pain.  Current Status: She states that left rib pain is improving. She continues to take Acetaminophen for pain as needed. She is currently taking all prescribed medications as directed.   She has mild hot flashes. Denies fevers, recent infections, chills, fatigue, and weight loss. She occasionally has headaches and dizziness. She denies visual changes and falls. She has a cough, which she r/t smoking. Denies chest pain, heart palpitations, and shortness of breath.   She denies GI symptoms.   She continues to have generalized joint pains.   Past Medical History:  Diagnosis Date  . Aorto-iliac atherosclerosis (HCC) 01/06/2016  . Asthma   . COPD (chronic obstructive pulmonary disease) (HCC)   . Hypertension   . Tobacco abuse 01/06/2016  . Vitamin D deficiency 01/06/2016    Family History  Problem Relation Age of Onset  . COPD Mother   . Heart failure Mother   . COPD Father   . Heart failure Father     Social History   Socioeconomic History  . Marital status: Divorced    Spouse name: Not on file  . Number of children: Not on file  . Years of education: Not on file  . Highest education level: Not on file  Occupational History  . Occupation: unemployed  Social Needs  . Financial resource strain: Not on file  . Food insecurity:    Worry: Never true    Inability: Never true  . Transportation needs:    Medical: No    Non-medical: No  Tobacco Use  . Smoking status: Current Every Day Smoker    Packs/day: 1.00    Years: 30.00    Pack years: 30.00    Types: Cigarettes  . Smokeless tobacco: Never Used  Substance and Sexual Activity  .  Alcohol use: No    Alcohol/week: 0.0 oz  . Drug use: No  . Sexual activity: Never  Lifestyle  . Physical activity:    Days per week: 6 days    Minutes per session: 40 min  . Stress: To some extent  Relationships  . Social connections:    Talks on phone: More than three times a week    Gets together: Three times a week    Attends religious service: 1 to 4 times per year    Active member of club or organization: No    Attends meetings of clubs or organizations: Never    Relationship status: Living with partner  Other Topics Concern  . Not on file  Social History Narrative  . Not on file   Past Surgical History:  Procedure Laterality Date  . ANKLE FRACTURE SURGERY Left 11/07/2009  . left ankle hardware removal    . left ankle surgery    . LOWER EXTREMITY ANGIOGRAPHY Left 10/17/2017   Procedure: LOWER EXTREMITY ANGIOGRAPHY;  Surgeon: Annice Needyew, Jason S, MD;  Location: [facility] INVASIVE CV LAB;  Service: Cardiovascular;  Laterality: Left;    There is no immunization history for the selected administration types on file for this patient.  Current Meds  Medication Sig  . amLODipine (NORVASC) 10 MG tablet  Take 1 tablet (10 mg total) by mouth daily.  Marland Kitchen aspirin EC 81 MG tablet Take by mouth.  Marland Kitchen atorvastatin (LIPITOR) 10 MG tablet Take 1 tablet (10 mg total) by mouth daily.  . carvedilol (COREG) 6.25 MG tablet Take 1 tablet (6.25 mg total) by mouth 2 (two) times daily.  . cloNIDine (CATAPRES) 0.2 MG tablet Take 1 tablet (0.2 mg total) by mouth 3 (three) times daily.  . clopidogrel (PLAVIX) 75 MG tablet Take 1 tablet (75 mg total) by mouth daily.  Marland Kitchen gabapentin (NEURONTIN) 300 MG capsule TAKE ONE CAPSULE BY MOUTH AT BEDTIME  . lisinopril (PRINIVIL,ZESTRIL) 40 MG tablet Take 1 tablet (40 mg total) by mouth daily.  . pantoprazole (PROTONIX) 40 MG tablet Take 1 tablet (40 mg total) by mouth daily.    Allergies  Allergen Reactions  . Vicodin [Hydrocodone-Acetaminophen] Nausea And Vomiting  . Hct  [Hydrochlorothiazide] Palpitations  . Tramadol Palpitations    BP 120/68   Pulse 69   Temp (!) 97.4 F (36.3 C)   Wt 160 lb 11.5 oz (72.9 kg)   LMP 08/09/2008 (Within Years)   BMI 29.40 kg/m      Review of Systems  Constitutional: Negative.   HENT: Negative.   Eyes: Negative.   Respiratory: Negative.   Cardiovascular: Negative.   Gastrointestinal: Negative.   Endocrine: Negative.   Genitourinary: Negative.   Musculoskeletal: Positive for arthralgias (generalized joint pain).       Mild left rib pain.   Skin: Negative.   Allergic/Immunologic: Negative.   Neurological: Positive for headaches (occasional ).  Hematological: Negative.   Psychiatric/Behavioral: Negative.    Objective:   Physical Exam  Constitutional: She is oriented to person, place, and time. She appears well-developed and well-nourished.  HENT:  Head: Normocephalic.  Right Ear: External ear normal.  Left Ear: External ear normal.  Nose: Nose normal.  Mouth/Throat: Oropharynx is clear and moist.  Eyes: Pupils are equal, round, and reactive to light. Conjunctivae and EOM are normal.  Neck: Normal range of motion. Neck supple.  Cardiovascular: Normal rate, regular rhythm, normal heart sounds and intact distal pulses.  Pulmonary/Chest: Effort normal and breath sounds normal.  Abdominal: Soft. Bowel sounds are normal.  Musculoskeletal: She exhibits tenderness (mild left rib ).  Neurological: She is alert and oriented to person, place, and time.  Skin: Skin is warm and dry. Capillary refill takes less than 2 seconds.  Psychiatric: She has a normal mood and affect. Her behavior is normal. Judgment and thought content normal.  Nursing note and vitals reviewed.  Assessment & Plan:   1. Essential hypertension Blood pressure is 120/68 today. She will continue Amlodipine, Carvedilol, and Clonidine as directed.   2. Tobacco abuse She continues to smoke. She will call office when she is ready to quit smoking.    3. Dizziness Stable. She is counseled to change positions slowly when ambulating.   4. Rib pain on left side Improving. Continue OTC pain meds as needed.   5. Hot flashes due to menopause Stable. She will continue Gabapentin as directed.   6. Follow up She will follow up in 3 months  No orders of the defined types were placed in this encounter.   Raliegh Ip,  MSN, FNP-BC [facility] [address] [place], [place] [address] [phone]

## 2018-01-30 ENCOUNTER — Ambulatory Visit
Admission: RE | Admit: 2018-01-30 | Discharge: 2018-01-30 | Disposition: A | Discharge status: Home / Self Care | Payer: Self-pay | Source: Ambulatory Visit | Attending: Family Medicine | Admitting: Family Medicine

## 2018-01-30 DIAGNOSIS — I700 Atherosclerosis of aorta: Secondary | ICD-10-CM | POA: Insufficient documentation

## 2018-01-30 DIAGNOSIS — R911 Solitary pulmonary nodule: Secondary | ICD-10-CM

## 2018-01-30 DIAGNOSIS — R918 Other nonspecific abnormal finding of lung field: Secondary | ICD-10-CM | POA: Insufficient documentation

## 2018-01-30 DIAGNOSIS — X58XXXA Exposure to other specified factors, initial encounter: Secondary | ICD-10-CM | POA: Insufficient documentation

## 2018-01-30 DIAGNOSIS — M4854XA Collapsed vertebra, not elsewhere classified, thoracic region, initial encounter for fracture: Secondary | ICD-10-CM | POA: Insufficient documentation

## 2018-03-10 ENCOUNTER — Other Ambulatory Visit (INDEPENDENT_AMBULATORY_CARE_PROVIDER_SITE_OTHER): Payer: Self-pay

## 2018-03-10 ENCOUNTER — Ambulatory Visit (INDEPENDENT_AMBULATORY_CARE_PROVIDER_SITE_OTHER): Payer: Self-pay | Admitting: Vascular Surgery

## 2018-03-15 ENCOUNTER — Ambulatory Visit (INDEPENDENT_AMBULATORY_CARE_PROVIDER_SITE_OTHER): Payer: Self-pay | Admitting: Vascular Surgery

## 2018-03-15 ENCOUNTER — Ambulatory Visit (INDEPENDENT_AMBULATORY_CARE_PROVIDER_SITE_OTHER): Payer: Self-pay

## 2018-03-15 ENCOUNTER — Encounter (INDEPENDENT_AMBULATORY_CARE_PROVIDER_SITE_OTHER): Payer: Self-pay | Admitting: Vascular Surgery

## 2018-03-15 VITALS — BP 149/91 | HR 55 | Resp 16 | Ht 62.0 in | Wt 154.0 lb

## 2018-03-15 DIAGNOSIS — Z720 Tobacco use: Secondary | ICD-10-CM

## 2018-03-15 DIAGNOSIS — E785 Hyperlipidemia, unspecified: Secondary | ICD-10-CM

## 2018-03-15 DIAGNOSIS — I771 Stricture of artery: Secondary | ICD-10-CM

## 2018-03-15 DIAGNOSIS — I10 Essential (primary) hypertension: Secondary | ICD-10-CM

## 2018-03-15 NOTE — Progress Notes (Signed)
Subjective:    Patient ID: Sheila [name], female    DOB: 04-Dec-1960, 57 y.o.   MRN: [id number] Chief Complaint  Patient presents with  . Follow-up    3-19month ultrasound    Patient presents for 7-month follow-up.  The patient is status post a angioplasty of the left subclavian artery on October 19, 2017.  The patient presents today without complaint.  Patient denies any left upper extremity pain or ulceration.  The patient underwent a left upper extremity arterial duplex which is notable for triphasic waveforms through the left subclavian, biphasic waveforms through the axillary, triphasic waveforms to the brachial with monophasic waveforms through the radial and ulnar arteries.  No obstruction was visualized in the left upper extremity.  Review of Systems  Constitutional: Negative.   HENT: Negative.   Eyes: Negative.   Respiratory: Negative.   Cardiovascular: Negative.   Gastrointestinal: Negative.   Endocrine: Negative.   Genitourinary: Negative.   Musculoskeletal: Negative.   Skin: Negative.   Allergic/Immunologic: Negative.   Neurological: Negative.   Hematological: Negative.   Psychiatric/Behavioral: Negative.       Objective:   Physical Exam  Constitutional: She is oriented to person, place, and time. She appears well-developed and well-nourished. No distress.  HENT:  Head: Normocephalic and atraumatic.  Right Ear: External ear normal.  Left Ear: External ear normal.  Eyes: Pupils are equal, round, and reactive to light. Conjunctivae and EOM are normal.  Neck: Normal range of motion.  Cardiovascular: Normal rate, regular rhythm, normal heart sounds and intact distal pulses.  Pulses:      Radial pulses are 2+ on the right side, and 2+ on the left side.       Dorsalis pedis pulses are 2+ on the right side, and 1+ on the left side.       Posterior tibial pulses are 2+ on the right side, and 1+ on the left side.  Left hand is warm with no ulcerations  Pulmonary/Chest:  Effort normal and breath sounds normal.  Musculoskeletal: Normal range of motion. She exhibits no edema.  Neurological: She is alert and oriented to person, place, and time.  Skin: Skin is warm and dry. She is not diaphoretic.  Psychiatric: She has a normal mood and affect. Her behavior is normal. Judgment and thought content normal.  Vitals reviewed.  BP (!) 149/91 (BP Location: Right Arm)   Pulse (!) 55   Resp 16   Ht 5\' 2"  (1.575 m)   Wt 154 lb (69.9 kg)   LMP 08/09/2008 (Within Years)   BMI 28.17 kg/m   Past Medical History:  Diagnosis Date  . Aorto-iliac atherosclerosis (HCC) 01/06/2016  . Asthma   . COPD (chronic obstructive pulmonary disease) (HCC)   . Hypertension   . Tobacco abuse 01/06/2016  . Vitamin D deficiency 01/06/2016   Social History   Socioeconomic History  . Marital status: Divorced    Spouse name: Not on file  . Number of children: Not on file  . Years of education: Not on file  . Highest education level: Not on file  Occupational History  . Occupation: unemployed  Social Needs  . Financial resource strain: Not on file  . Food insecurity:    Worry: Never true    Inability: Never true  . Transportation needs:    Medical: No    Non-medical: No  Tobacco Use  . Smoking status: Current Every Day Smoker    Packs/day: 1.00    Years: 30.00  Pack years: 30.00    Types: Cigarettes  . Smokeless tobacco: Never Used  Substance and Sexual Activity  . Alcohol use: No    Alcohol/week: 0.0 oz  . Drug use: No  . Sexual activity: Never  Lifestyle  . Physical activity:    Days per week: 6 days    Minutes per session: 40 min  . Stress: To some extent  Relationships  . Social connections:    Talks on phone: More than three times a week    Gets together: Three times a week    Attends religious service: 1 to 4 times per year    Active member of club or organization: No    Attends meetings of clubs or organizations: Never    Relationship status: Living  with partner  . Intimate partner violence:    Fear of current or ex partner: No    Emotionally abused: No    Physically abused: No    Forced sexual activity: No  Other Topics Concern  . Not on file  Social History Narrative  . Not on file   Past Surgical History:  Procedure Laterality Date  . ANKLE FRACTURE SURGERY Left 11/07/2009  . left ankle hardware removal    . left ankle surgery    . LOWER EXTREMITY ANGIOGRAPHY Left 10/17/2017   Procedure: LOWER EXTREMITY ANGIOGRAPHY;  Surgeon: Annice Needy, MD;  Location: [facility] INVASIVE CV LAB;  Service: Cardiovascular;  Laterality: Left;   Family History  Problem Relation Age of Onset  . COPD Mother   . Heart failure Mother   . COPD Father   . Heart failure Father    Allergies  Allergen Reactions  . Vicodin [Hydrocodone-Acetaminophen] Nausea And Vomiting  . Hct [Hydrochlorothiazide] Palpitations  . Tramadol Palpitations      Assessment & Plan:  Patient presents for 67-month follow-up.  The patient is status post a angioplasty of the left subclavian artery on October 19, 2017.  The patient presents today without complaint.  Patient denies any left upper extremity pain or ulceration.  The patient underwent a left upper extremity arterial duplex which is notable for triphasic waveforms through the left subclavian, biphasic waveforms through the axillary, triphasic waveforms to the brachial with monophasic waveforms through the radial and ulnar arteries.  No obstruction was visualized in the left upper extremity.  1. Subclavian artery stenosis (HCC) - Stable The patient is status post a left subclavian artery angioplasty on 10/17/2017 for left subclavian artery stenosis. The patient presents today for 4-month follow-up. The patient's previous ultrasound completed on December 02, 2017 was notable for triphasic blood flow from the subclavian to the radial/ulnar arteries.  The patient presents today with monophasic blood flow to the radial and ulnar  arteries. The patient denies any left upper extremity discomfort or ulcer formation. We discussed the possibility of moving forward with an angiogram to assess her anatomy however at this time the patient does not want to move forward with this. Will bring her back in 6 months to closely surveilled the arterials status of her left upper extremity. The patient was encouraged to call the office sooner if she starts to experience any left upper extremity pain or ulceration. The patient expressed understanding.  - VAS [place] UPPER EXTREMITY ARTERIAL DUPLEX; Future  2. Essential hypertension - Stable Encouraged good control as its slows the progression of atherosclerotic disease  3. Tobacco abuse - Stable We had a discussion for approximately five minutes regarding the absolute need for smoking cessation  due to the deleterious nature of tobacco on the vascular system. We discussed the tobacco use would diminish patency of any intervention, and likely significantly worsen progressio of disease. We discussed multiple agents for quitting including replacement therapy or medications to reduce cravings such as Chantix. The patient voices their understanding of the importance of smoking cessation.  4. Hyperlipidemia, unspecified hyperlipidemia type - Stable Encouraged good control as its slows the progression of atherosclerotic disease  Current Outpatient Medications on File Prior to Visit  Medication Sig Dispense Refill  . amLODipine (NORVASC) 10 MG tablet Take 1 tablet (10 mg total) by mouth daily. 90 tablet 3  . aspirin EC 81 MG tablet Take by mouth.    Marland Kitchen. atorvastatin (LIPITOR) 10 MG tablet Take 1 tablet (10 mg total) by mouth daily. 30 tablet 11  . carvedilol (COREG) 6.25 MG tablet Take 1 tablet (6.25 mg total) by mouth 2 (two) times daily. 180 tablet 3  . cloNIDine (CATAPRES) 0.2 MG tablet Take 1 tablet (0.2 mg total) by mouth 3 (three) times daily. 90 tablet 2  . clopidogrel (PLAVIX) 75 MG tablet  Take 1 tablet (75 mg total) by mouth daily. 30 tablet 11  . gabapentin (NEURONTIN) 300 MG capsule TAKE ONE CAPSULE BY MOUTH AT BEDTIME 30 capsule 0  . lisinopril (PRINIVIL,ZESTRIL) 40 MG tablet Take 1 tablet (40 mg total) by mouth daily. 90 tablet 4  . pantoprazole (PROTONIX) 40 MG tablet Take 1 tablet (40 mg total) by mouth daily. 30 tablet 3   No current facility-administered medications on file prior to visit.    There are no Patient Instructions on file for this visit. No follow-ups on file.  KIMBERLY A STEGMAYER, PA-C

## 2018-04-13 ENCOUNTER — Ambulatory Visit: Payer: Self-pay | Admitting: Family Medicine

## 2018-04-13 VITALS — BP 196/113 | HR 86 | Temp 97.4°F | Ht 60.0 in | Wt 154.5 lb

## 2018-04-13 DIAGNOSIS — Z780 Asymptomatic menopausal state: Secondary | ICD-10-CM

## 2018-04-13 DIAGNOSIS — J309 Allergic rhinitis, unspecified: Secondary | ICD-10-CM | POA: Insufficient documentation

## 2018-04-13 DIAGNOSIS — S22000A Wedge compression fracture of unspecified thoracic vertebra, initial encounter for closed fracture: Secondary | ICD-10-CM | POA: Insufficient documentation

## 2018-04-13 DIAGNOSIS — J301 Allergic rhinitis due to pollen: Secondary | ICD-10-CM

## 2018-04-13 DIAGNOSIS — Z720 Tobacco use: Secondary | ICD-10-CM

## 2018-04-13 DIAGNOSIS — I771 Stricture of artery: Secondary | ICD-10-CM

## 2018-04-13 DIAGNOSIS — I10 Essential (primary) hypertension: Secondary | ICD-10-CM

## 2018-04-13 MED ORDER — PANTOPRAZOLE SODIUM 40 MG PO TBEC: 40.0000 mg | DELAYED_RELEASE_TABLET | Freq: Every day | ORAL | 3 refills | Status: DC

## 2018-04-13 MED ORDER — CARVEDILOL 12.5 MG PO TABS: 12.5000 mg | ORAL_TABLET | Freq: Two times a day (BID) | ORAL | 2 refills | Status: DC

## 2018-04-13 NOTE — Progress Notes (Signed)
Primary Care Progress Note  Patient: Sheila [name] Female    DOB: [birth year]   57 y.o.   MRN: [id number] Visit Date: 04/13/2018  Today's Provider: Shirlee Latch MD  Chief Complaint  Patient presents with  . Hypertension    Reports it has been running high and low   Subjective:    HPI  HTN: - Medications: amlodipine 10mg  daily, coreg 6.25mg  BID, Clonidine 0.2mg  TID, lisinopril 40mg  daily - Compliance: good, reports no missed doses, has taken her medication today - Checking BP at home: occasionally, fluctuates high and low - Denies any SOB, CP, vision changes, LE edema, medication SEs, or symptoms of hypotension  Does not feel ready to quit smoking at this time.  Knows that this is detrimental to her health.  States that smoking is the only thing that keeps her sane.  Not taking gabapentin regularly.  Didn't feel like this was very helpful.  Worried that it will be difficult to get up and function the next morning when she takes it.  Followed by Vascular surgery for PAD, aorto-iliac atherosclerosis, subclavian artery stenosis.  She is going to follow-up again in ~5 months.  Sinus congestion x1 wk.  States this tends to happen around changes in seasons each year.    Rib pain now improved mostly.  T8 compression fracture on CT chest in 01/2018.  States that she did not know the results of this study.  She has never had a DEXA or been told that she has osteoporosis.  No injury/trauma. Has h/o rib fractures.     Allergies  Allergen Reactions  . Vicodin [Hydrocodone-Acetaminophen] Nausea And Vomiting  . Hct [Hydrochlorothiazide] Palpitations  . Tramadol Palpitations    Current Outpatient Medications:  .  amLODipine (NORVASC) 10 MG tablet, Take 1 tablet (10 mg total) by mouth daily., Disp: 90 tablet, Rfl: 3 .  aspirin EC 81 MG tablet, Take by mouth., Disp: , Rfl:  .  atorvastatin (LIPITOR) 10 MG tablet, Take 1 tablet (10 mg total) by mouth daily., Disp: 30 tablet, Rfl:  11 .  carvedilol (COREG) 6.25 MG tablet, Take 1 tablet (6.25 mg total) by mouth 2 (two) times daily., Disp: 180 tablet, Rfl: 3 .  cloNIDine (CATAPRES) 0.2 MG tablet, Take 1 tablet (0.2 mg total) by mouth 3 (three) times daily., Disp: 90 tablet, Rfl: 2 .  clopidogrel (PLAVIX) 75 MG tablet, Take 1 tablet (75 mg total) by mouth daily., Disp: 30 tablet, Rfl: 11 .  gabapentin (NEURONTIN) 300 MG capsule, TAKE ONE CAPSULE BY MOUTH AT BEDTIME, Disp: 30 capsule, Rfl: 0 .  lisinopril (PRINIVIL,ZESTRIL) 40 MG tablet, Take 1 tablet (40 mg total) by mouth daily., Disp: 90 tablet, Rfl: 4 .  pantoprazole (PROTONIX) 40 MG tablet, Take 1 tablet (40 mg total) by mouth daily., Disp: 30 tablet, Rfl: 3  Review of Systems  Constitutional: Negative.   HENT: Positive for congestion, postnasal drip, rhinorrhea, sinus pressure, sinus pain and sneezing.   Respiratory: Negative.   Cardiovascular: Negative.   Gastrointestinal: Negative.   Genitourinary: Negative.   Musculoskeletal: Positive for arthralgias, back pain and myalgias.  Skin: Negative.   Neurological: Positive for numbness. Negative for tremors, seizures, syncope, speech difficulty and weakness.  Psychiatric/Behavioral: Negative.     Social History   Tobacco Use  . Smoking status: Current Every Day Smoker    Packs/day: 1.00    Years: 30.00    Pack years: 30.00    Types: Cigarettes  . Smokeless tobacco: Never  Used  Substance Use Topics  . Alcohol use: No    Alcohol/week: 0.0 standard drinks   Objective:   BP (!) 196/113   Pulse 86   Temp (!) 97.4 F (36.3 C)   Ht 5' (1.524 m)   Wt 154 lb 8 oz (70.1 kg)   LMP 08/09/2008 (Within Years)   SpO2 96%   BMI 30.17 kg/m   Physical Exam  Constitutional: She is oriented to person, place, and time. She appears well-developed and well-nourished. No distress.  HENT:  Head: Normocephalic and atraumatic.  Right Ear: External ear normal.  Left Ear: External ear normal.  Nose: Nose normal.   Mouth/Throat: Oropharynx is clear and moist. No oropharyngeal exudate.  Eyes: Conjunctivae are normal.  Neck: Neck supple. No thyromegaly present.  Cardiovascular: Normal rate, regular rhythm, normal heart sounds and intact distal pulses.  No murmur heard. Pulmonary/Chest: Effort normal and breath sounds normal. No respiratory distress. She has no wheezes. She has no rales.  Abdominal: Soft. She exhibits no distension. There is no tenderness.  Musculoskeletal: She exhibits no edema.  Lymphadenopathy:    She has no cervical adenopathy.  Neurological: She is alert and oriented to person, place, and time.  Skin: Skin is warm and dry. Capillary refill takes less than 2 seconds. No rash noted.  Psychiatric: She has a normal mood and affect. Her behavior is normal.  Vitals reviewed.       Assessment & Plan:   1. Essential hypertension -Significantly elevated today -Asymptomatic with no signs of endorgan damage -Discussed strict return precautions and emergent precautions - Continue amlodipine, lisinopril, clonidine at current doses -Increase carvedilol to 12.5 mg twice daily -Stressed the importance of good compliance with medications - Basic Metabolic Panel (BMET)  2. Subclavian artery stenosis (HCC) -Followed by vascular surgery -Has follow-up with imaging in about 5 months  3. Tobacco abuse -Discussed the importance of tobacco cessation, especially in the setting of her arterial disease and significant hypertension -Patient is pre-contemplative and not interested in quitting at this time  4. Seasonal allergic rhinitis due to pollen -Patient's sinus congestion seems to be related to seasonal allergies - Discussed use of OTC antihistamine such as Zyrtec or Claritin daily, especially in seasons when her allergies are worse  5. Compression fracture of body of thoracic vertebra (HCC) -New problem -No injury or trauma, so this suggests this was likely an osteoporotic  fracture -Seems to have healed and patient is asymptomatic at this time -She does need a bone density scan to see if she needs treatment for osteoporosis given this atraumatic fracture - DG Bone Density; Future  6. Postmenopausal - DG Bone Density; Future   Return in about 4 weeks (around 05/11/2018) for BP f/u.   Shirlee Latch, MD MPH

## 2018-04-13 NOTE — Patient Instructions (Addendum)
Start Vitamin D3 2000 units daily  Start loratidine (Claritin)  Or Cetirizine (Zyrtec)  1 pill daily for allergies and sinus congestion

## 2018-04-14 LAB — BASIC METABOLIC PANEL
BUN / CREAT RATIO: 12 (ref 9–23)
BUN: 8 mg/dL (ref 6–24)
CO2: 20 mmol/L (ref 20–29)
Calcium: 11.7 mg/dL — ABNORMAL HIGH (ref 8.7–10.2)
Chloride: 107 mmol/L — ABNORMAL HIGH (ref 96–106)
Creatinine, Ser: 0.69 mg/dL (ref 0.57–1.00)
GFR calc non Af Amer: 96 mL/min/{1.73_m2} (ref >59)
GFR, EST AFRICAN AMERICAN: 111 mL/min/{1.73_m2} (ref >59)
GLUCOSE: 77 mg/dL (ref 65–99)
Potassium: 4 mmol/L (ref 3.5–5.2)
SODIUM: 145 mmol/L — AB (ref 134–144)

## 2018-05-11 ENCOUNTER — Ambulatory Visit: Payer: Self-pay | Admitting: Family Medicine

## 2018-05-11 DIAGNOSIS — I708 Atherosclerosis of other arteries: Secondary | ICD-10-CM

## 2018-05-11 DIAGNOSIS — Z720 Tobacco use: Secondary | ICD-10-CM

## 2018-05-11 DIAGNOSIS — I10 Essential (primary) hypertension: Secondary | ICD-10-CM

## 2018-05-11 DIAGNOSIS — I700 Atherosclerosis of aorta: Secondary | ICD-10-CM

## 2018-05-11 MED ORDER — CARVEDILOL 25 MG PO TABS: 25.0000 mg | ORAL_TABLET | Freq: Two times a day (BID) | ORAL | 5 refills | Status: DC

## 2018-05-11 NOTE — Progress Notes (Signed)
Subjective:    Patient ID: Sheila [name], female    DOB: [birth year], 57 y.o.   MRN: [id number]  HPI  Patient Active Problem List   Diagnosis Date Noted  . Allergic rhinitis 04/13/2018  . Compression fracture of body of thoracic vertebra (HCC) 04/13/2018  . Hyperlipidemia 03/15/2018  . 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 PAD (peripheral artery disease) (HCC) 11/22/2017  . Malignant hypertension 10/20/2017  . Carotid stenosis 10/14/2017  . Subclavian artery stenosis (HCC) 08/11/2017  . Dizziness 03/08/2017  . Left sided numbness 03/08/2017  . Solitary pulmonary nodule 03/08/2017  . GERD (gastroesophageal reflux disease) 03/08/2017  . Abnormal EKG 02/26/2017  . Hypokalemia 02/26/2017  . Urge incontinence of urine 05/04/2016  . Recurrent UTI 03/02/2016  . COPD (chronic obstructive pulmonary disease) (HCC) 01/06/2016  . Adrenal nodule (HCC) 01/06/2016  . Tobacco abuse 01/06/2016  . Aorto-iliac atherosclerosis (HCC) 01/06/2016  . Vitamin D deficiency 01/06/2016  . Lower abdominal pain 01/06/2016  . Myopia of both eyes 01/06/2016  . Hypercalcemia 01/06/2016  . Essential hypertension 12/11/2015  . Infection and inflammatory reaction due to other internal orthopedic device, implant, and graft 02/11/2012     Review of Systems      Objective:   Physical Exam  Constitutional: She appears well-developed and well-nourished.  HENT:  Head: Normocephalic and atraumatic.  Eyes: Conjunctivae are normal.  Cardiovascular: Normal rate and regular rhythm.  Pulmonary/Chest: Effort normal and breath sounds normal.  Musculoskeletal: She exhibits edema.  Trace-1+ of left lower leg.  Neurological: She displays abnormal reflex.  Psychiatric: She has a normal mood and affect. Her behavior is normal. Judgment and thought content normal.    BP (!) 189/105 (BP Location: Right Arm, Patient Position: Sitting)   Pulse 75   Temp 97.7 F (36.5 C) (Oral)   Ht  5' (1.524 m)   Wt 156 lb 6.4 oz (70.9 kg)   LMP 08/09/2008 (Within Years)   BMI 30.54 kg/m   Allergies as of 05/11/2018      Reactions   Vicodin [hydrocodone-acetaminophen] Nausea And Vomiting   Vicodin [hydrocodone-acetaminophen]    Abd pain, N/V   Hct [hydrochlorothiazide] Palpitations   Tramadol Palpitations      Medication List        Accurate as of 05/11/18  6:53 PM. Always use your most recent med list.          amLODipine 10 MG tablet Commonly known as:  NORVASC Take 1 tablet (10 mg total) by mouth daily.   aspirin EC 81 MG tablet Take by mouth.   atorvastatin 10 MG tablet Commonly known as:  LIPITOR Take 1 tablet (10 mg total) by mouth daily.   carvedilol 12.5 MG tablet Commonly known as:  COREG Take 1 tablet (12.5 mg total) by mouth 2 (two) times daily.   cloNIDine 0.2 MG tablet Commonly known as:  CATAPRES Take 1 tablet (0.2 mg total) by mouth 3 (three) times daily.   clopidogrel 75 MG tablet Commonly known as:  PLAVIX Take 1 tablet (75 mg total) by mouth daily.   gabapentin 300 MG capsule Commonly known as:  NEURONTIN TAKE ONE CAPSULE BY MOUTH AT BEDTIME   lisinopril 40 MG tablet Commonly known as:  PRINIVIL,ZESTRIL Take 1 tablet (40 mg total) by mouth daily.   pantoprazole 40 MG tablet Commonly known as:  PROTONIX Take 1 tablet (40 mg total) by mouth daily.           Assessment &  Plan:  1. Hypercalcemia - PTH, Intact and Calcium  2. Essential hypertension 1 month F/U  3. Aorto-iliac atherosclerosis (HCC)   4. Malignant hypertension   5. Tobacco abuse Stop

## 2018-05-12 LAB — SPECIMEN STATUS

## 2018-05-13 LAB — PTH, INTACT AND CALCIUM: CALCIUM: 11.8 mg/dL — AB (ref 8.7–10.2)

## 2018-05-13 LAB — SPECIMEN STATUS REPORT

## 2018-05-15 ENCOUNTER — Other Ambulatory Visit: Payer: Self-pay | Admitting: Internal Medicine

## 2018-05-15 ENCOUNTER — Encounter: Payer: Self-pay | Admitting: Pharmacist

## 2018-05-15 DIAGNOSIS — I10 Essential (primary) hypertension: Secondary | ICD-10-CM

## 2018-05-15 NOTE — Progress Notes (Deleted)
Medication Management Clinic Visit Note  Patient: Sheila [name] MRN: [id number] Date of Birth: [birth year] PCP: System, Provider Not In   [name] 57 y.o. female presents for a 46mo f/u MTM visit today. Main objectives of today's visit are to check BP and assess for continued compliance. Pt has stated at both MTM and office visits that she rarely misses doses but has not had her clonidine filled since 12/20/17.  LMP 08/09/2008 (Within Years)   Patient Information   Past Medical History:  Diagnosis Date  . Aorto-iliac atherosclerosis (HCC) 01/06/2016  . Asthma   . COPD (chronic obstructive pulmonary disease) (HCC)   . Hypertension   . Tobacco abuse 01/06/2016  . Vitamin D deficiency 01/06/2016      Past Surgical History:  Procedure Laterality Date  . ANKLE FRACTURE SURGERY Left 11/07/2009  . left ankle hardware removal    . left ankle surgery    . LOWER EXTREMITY ANGIOGRAPHY Left 10/17/2017   Procedure: LOWER EXTREMITY ANGIOGRAPHY;  Surgeon: Annice Needy, MD;  Location: [facility] INVASIVE CV LAB;  Service: Cardiovascular;  Laterality: Left;     Family History  Problem Relation Age of Onset  . COPD Mother   . Heart failure Mother   . COPD Father   . Heart failure Father     New Diagnoses (since last visit):   Family Support: {Family Support:210800003}  Lifestyle Diet: Breakfast:*** Lunch:*** Dinner:*** Drinks:***            Social History   Substance and Sexual Activity  Alcohol Use No  . Alcohol/week: 0.0 standard drinks      Social History   Tobacco Use  Smoking Status Current Every Day Smoker  . Packs/day: 1.00  . Years: 30.00  . Pack years: 30.00  . Types: Cigarettes  Smokeless Tobacco Never Used      Health Maintenance  Topic Date Due  . Hepatitis C Screening  [birth year]  . TETANUS/TDAP  04/10/1979  . PAP SMEAR  04/09/1981  . MAMMOGRAM  04/09/2010  . COLONOSCOPY  04/09/2010  . INFLUENZA VACCINE  03/09/2018  . HIV Screening  Completed     Assessment and Plan:  1. Medication Compliance: Patient is able to correctly state names of medication/indications/frequency for each of her medications without prompting. She states that she takes her medications regularly, at scheduled times and rarely misses doses.   2. Hypertension: Patient currently taking amlodipine, carvedilol, clonidine and lisinopril. She sates her BP usually runs 160-170 systolic at home, though has been as low as 118 systolic, recently. BP elevated in office today, though could be due to smoking prior to visit and not taking her medications before her visit this morning. Clonidine was added ~3.5 weeks ago, and patient has tolerated this well with the exception of ~1 missed dose that left her feeling "swimmy headed" and with a headache due to hypertension. Discussed the potential for rebound hypertension with missed doses, patient verbalized understanding. Discussed limiting sodium intake (goal <2,300 mg daily) and smoking cessation in effort to lower blood pressure, patient verbalized understanding.   3. Atherosclerosis: Patient currently taking clopidogrel and ASA, well tolerated, no signs or symptoms of bleeding. Potential DDI noted with esomeprazole and clopidogrel (patient aware, MD faxed).   4. GERD: Patient changed to pantoprazole, well controlled.   5. Smoking: Patient currently smoking ~1 PPD (previously down to ~1/2 PPD, increased as of late due to family stressors). Patient states she has tried NRT patches and quitting cold [place] in the  past but eventually relapsed. She is open to discussion of smoking cessation, but is not ready to commit to quitting at this time. Made patient aware of resources available when she does decide she is ready to quit.

## 2018-05-18 ENCOUNTER — Encounter: Payer: Self-pay | Admitting: Pharmacist

## 2018-05-18 ENCOUNTER — Other Ambulatory Visit: Payer: Self-pay | Admitting: Adult Health Nurse Practitioner

## 2018-05-25 ENCOUNTER — Other Ambulatory Visit: Payer: Self-pay

## 2018-05-25 DIAGNOSIS — I10 Essential (primary) hypertension: Secondary | ICD-10-CM

## 2018-05-25 MED ORDER — LISINOPRIL 40 MG PO TABS: 40.0000 mg | ORAL_TABLET | Freq: Every day | ORAL | 4 refills | Status: DC

## 2018-05-26 LAB — PTH, INTACT AND CALCIUM: Calcium: 11.4 mg/dL — ABNORMAL HIGH (ref 8.7–10.2)

## 2018-06-13 ENCOUNTER — Ambulatory Visit: Payer: Self-pay | Admitting: Urology

## 2018-06-13 ENCOUNTER — Telehealth: Payer: Self-pay | Admitting: Urology

## 2018-06-13 DIAGNOSIS — I10 Essential (primary) hypertension: Secondary | ICD-10-CM

## 2018-06-13 NOTE — Telephone Encounter (Signed)
Would you check on the status of her bone density scan?

## 2018-06-13 NOTE — Progress Notes (Signed)
Subjective:    Patient ID: Sheila [name], female    DOB: [birth year], 57 y.o.   MRN: [id number]  HPI   Patient states she is taking her BP meds as directed - under a lot of social stress  Hasn't heard about the bone density appointment.   Patient Active Problem List   Diagnosis Date Noted  . Allergic rhinitis 04/13/2018  . Compression fracture of body of thoracic vertebra (HCC) 04/13/2018  . Hyperlipidemia 03/15/2018  . 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 PAD (peripheral artery disease) (HCC) 11/22/2017  . Malignant hypertension 10/20/2017  . Carotid stenosis 10/14/2017  . Subclavian artery stenosis (HCC) 08/11/2017  . Dizziness 03/08/2017  . Left sided numbness 03/08/2017  . Solitary pulmonary nodule 03/08/2017  . GERD (gastroesophageal reflux disease) 03/08/2017  . Abnormal EKG 02/26/2017  . Hypokalemia 02/26/2017  . Urge incontinence of urine 05/04/2016  . Recurrent UTI 03/02/2016  . COPD (chronic obstructive pulmonary disease) (HCC) 01/06/2016  . Adrenal nodule (HCC) 01/06/2016  . Tobacco abuse 01/06/2016  . Aorto-iliac atherosclerosis (HCC) 01/06/2016  . Vitamin D deficiency 01/06/2016  . Lower abdominal pain 01/06/2016  . Myopia of both eyes 01/06/2016  . Hypercalcemia 01/06/2016  . Essential hypertension 12/11/2015  . Infection and inflammatory reaction due to other internal orthopedic device, implant, and graft 02/11/2012     Review of Systems      Objective:   Physical Exam  Constitutional: She is oriented to person, place, and time. She appears well-developed and well-nourished.  HENT:  Head: Normocephalic and atraumatic.  Eyes: Conjunctivae are normal.  Cardiovascular: Normal rate and regular rhythm.  Pulmonary/Chest: Effort normal and breath sounds normal.  Musculoskeletal: Normal range of motion.  Neurological: She is alert and oriented to person, place, and time.  Skin: Skin is dry.  Psychiatric: She has a  normal mood and affect. Her behavior is normal. Judgment and thought content normal.    BP (!) 159/94   Pulse 65   Temp (!) 97.3 F (36.3 C)   Ht 5' (1.524 m)   Wt 162 lb 11.2 oz (73.8 kg)   LMP 08/09/2008 (Within Years)   BMI 31.78 kg/m   Allergies as of 06/13/2018      Reactions   Vicodin [hydrocodone-acetaminophen] Nausea And Vomiting   Vicodin [hydrocodone-acetaminophen]    Abd pain, N/V   Hct [hydrochlorothiazide] Palpitations   Tramadol Palpitations      Medication List        Accurate as of 06/13/18  7:38 PM. Always use your most recent med list.          amLODipine 10 MG tablet Commonly known as:  NORVASC Take 1 tablet (10 mg total) by mouth daily.   aspirin EC 81 MG tablet Take by mouth.   atorvastatin 10 MG tablet Commonly known as:  LIPITOR Take 1 tablet (10 mg total) by mouth daily.   carvedilol 25 MG tablet Commonly known as:  COREG Take 1 tablet (25 mg total) by mouth 2 (two) times daily with a meal.   cloNIDine 0.2 MG tablet Commonly known as:  CATAPRES TAKE ONE TABLET BY MOUTH 3 TIMES A DAY   clopidogrel 75 MG tablet Commonly known as:  PLAVIX Take 1 tablet (75 mg total) by mouth daily.   gabapentin 300 MG capsule Commonly known as:  NEURONTIN TAKE ONE CAPSULE BY MOUTH AT BEDTIME   lisinopril 40 MG tablet Commonly known as:  PRINIVIL,ZESTRIL Take 1 tablet (40 mg total)  by mouth daily.   pantoprazole 40 MG tablet Commonly known as:  PROTONIX Take 1 tablet (40 mg total) by mouth daily.           Assessment & Plan:  1. Hypercalcemia - PTH, Intact and Calcium - not the correct specimen container - lab not done    2. Essential hypertension Not at goal   3. Aorto-iliac atherosclerosis (HCC)   4. Malignant hypertension Check TSH  5. Tobacco abuse Stop

## 2018-06-14 LAB — TSH

## 2018-06-14 LAB — PTH, INTACT AND CALCIUM: PTH: 30 pg/mL (ref 15–65)

## 2018-06-14 NOTE — Addendum Note (Signed)
Addended by: Dustin Flock D on: 06/14/2018 09:19 AM   Modules accepted: Orders

## 2018-06-15 LAB — TSH: TSH: 3.27 u[IU]/mL (ref 0.450–4.500)

## 2018-06-15 NOTE — Telephone Encounter (Signed)
Spoke with patient. Need to update [facility]. Scan can be done at [facility].

## 2018-06-29 ENCOUNTER — Ambulatory Visit: Payer: Self-pay | Admitting: Adult Health Nurse Practitioner

## 2018-06-29 DIAGNOSIS — I10 Essential (primary) hypertension: Secondary | ICD-10-CM

## 2018-06-29 MED ORDER — CLONIDINE HCL 0.3 MG PO TABS: 0.3000 mg | ORAL_TABLET | Freq: Two times a day (BID) | ORAL | 3 refills | Status: DC

## 2018-06-29 NOTE — Progress Notes (Signed)
Established Patient Office Visit  Subjective:  Patient ID: Sheila [name], female    DOB: Dec 14, 1960  Age: 57 y.o. MRN: [id number]  CC:  Chief Complaint  Patient presents with  . Follow-up    HPI AHNYA AKRE presents for lab results. BP not controlled at 211/112.  HTN: Pt reports her BP has been running 150/90 at home and she endorses taking all meds as rx.   Past Medical History:  Diagnosis Date  . Aorto-iliac atherosclerosis (HCC) 01/06/2016  . Asthma   . COPD (chronic obstructive pulmonary disease) (HCC)   . Hypertension   . Tobacco abuse 01/06/2016  . Vitamin D deficiency 01/06/2016    Past Surgical History:  Procedure Laterality Date  . ANKLE FRACTURE SURGERY Left 11/07/2009  . left ankle hardware removal    . left ankle surgery    . LOWER EXTREMITY ANGIOGRAPHY Left 10/17/2017   Procedure: LOWER EXTREMITY ANGIOGRAPHY;  Surgeon: Annice Needy, MD;  Location: [facility] INVASIVE CV LAB;  Service: Cardiovascular;  Laterality: Left;    Family History  Problem Relation Age of Onset  . COPD Mother   . Heart failure Mother   . COPD Father   . Heart failure Father     Social History   Socioeconomic History  . Marital status: Divorced    Spouse name: Not on file  . Number of children: Not on file  . Years of education: Not on file  . Highest education level: GED or equivalent  Occupational History  . Occupation: unemployed  Social Needs  . Financial resource strain: Somewhat hard  . Food insecurity:    Worry: Never true    Inability: Never true  . Transportation needs:    Medical: No    Non-medical: No  Tobacco Use  . Smoking status: Current Every Day Smoker    Packs/day: 1.00    Years: 30.00    Pack years: 30.00    Types: Cigarettes  . Smokeless tobacco: Never Used  Substance and Sexual Activity  . Alcohol use: No    Alcohol/week: 0.0 standard drinks  . Drug use: No  . Sexual activity: Never  Lifestyle  . Physical activity:    Days per week: 6 days    Minutes per session: 40 min  . Stress: To some extent  Relationships  . Social connections:    Talks on phone: More than three times a week    Gets together: Three times a week    Attends religious service: 1 to 4 times per year    Active member of club or organization: No    Attends meetings of clubs or organizations: Never    Relationship status: Living with partner  . Intimate partner violence:    Fear of current or ex partner: No    Emotionally abused: No    Physically abused: No    Forced sexual activity: No  Other Topics Concern  . Not on file  Social History Narrative   Food stamps for both her and friend she has been with for 23 years. Some months run low, but always make it.     Outpatient Medications Prior to Visit  Medication Sig Dispense Refill  . amLODipine (NORVASC) 10 MG tablet Take 1 tablet (10 mg total) by mouth daily. 90 tablet 3  . aspirin EC 81 MG tablet Take by mouth.    Marland Kitchen atorvastatin (LIPITOR) 10 MG tablet Take 1 tablet (10 mg total) by mouth daily. 30 tablet 11  .  carvedilol (COREG) 25 MG tablet Take 1 tablet (25 mg total) by mouth 2 (two) times daily with a meal. 60 tablet 5  . clopidogrel (PLAVIX) 75 MG tablet Take 1 tablet (75 mg total) by mouth daily. 30 tablet 11  . gabapentin (NEURONTIN) 300 MG capsule TAKE ONE CAPSULE BY MOUTH AT BEDTIME 30 capsule 0  . lisinopril (PRINIVIL,ZESTRIL) 40 MG tablet Take 1 tablet (40 mg total) by mouth daily. 90 tablet 4  . pantoprazole (PROTONIX) 40 MG tablet Take 1 tablet (40 mg total) by mouth daily. 30 tablet 3  . cloNIDine (CATAPRES) 0.2 MG tablet TAKE ONE TABLET BY MOUTH 3 TIMES A DAY 270 tablet 0   No facility-administered medications prior to visit.     Allergies  Allergen Reactions  . Vicodin [Hydrocodone-Acetaminophen] Nausea And Vomiting  . Vicodin [Hydrocodone-Acetaminophen]     Abd pain, N/V  . Hct [Hydrochlorothiazide] Palpitations  . Tramadol Palpitations    ROS Review of Systems  All other  systems reviewed and are negative.     Objective:    Physical Exam  Constitutional: She is oriented to person, place, and time. She appears well-developed and well-nourished.  Cardiovascular: Normal rate, normal heart sounds and intact distal pulses.  Pulmonary/Chest: Effort normal and breath sounds normal.  Abdominal: Soft. Bowel sounds are normal.  Neurological: She is alert and oriented to person, place, and time.  Skin: Skin is warm and dry.  Psychiatric: She has a normal mood and affect. Her behavior is normal. Judgment and thought content normal.  Vitals reviewed.   BP (!) 211/112 (BP Location: Right Arm)   Pulse 80   Temp (!) 97.5 F (36.4 C)   Ht 5\' 5"  (1.651 m)   Wt 159 lb 8 oz (72.3 kg)   LMP 08/09/2008 (Within Years)   BMI 26.54 kg/m  Wt Readings from Last 3 Encounters:  06/29/18 159 lb 8 oz (72.3 kg)  06/13/18 162 lb 11.2 oz (73.8 kg)  05/11/18 156 lb 6.4 oz (70.9 kg)     Health Maintenance Due  Topic Date Due  . Hepatitis C Screening  06-Aug-1961  . TETANUS/TDAP  04/10/1979  . PAP SMEAR  04/09/1981  . MAMMOGRAM  04/09/2010  . COLONOSCOPY  04/09/2010  . INFLUENZA VACCINE  03/09/2018    There are no preventive care reminders to display for this patient.  Lab Results  Component Value Date   TSH CANCELED 06/13/2018   TSH 3.270 06/13/2018   Lab Results  Component Value Date   WBC WILL FOLLOW 05/11/2018   HGB WILL FOLLOW 05/11/2018   HCT WILL FOLLOW 05/11/2018   MCV WILL FOLLOW 05/11/2018   PLT WILL FOLLOW 05/11/2018   Lab Results  Component Value Date   NA 145 (H) 04/13/2018   K 4.0 04/13/2018   CO2 20 04/13/2018   GLUCOSE 77 04/13/2018   BUN 8 04/13/2018   CREATININE 0.69 04/13/2018   BILITOT 0.3 11/29/2017   ALKPHOS 87 11/29/2017   AST 19 11/29/2017   ALT 16 11/29/2017   PROT 6.6 11/29/2017   ALBUMIN 3.4 (L) 11/29/2017   CALCIUM CANCELED 06/13/2018   ANIONGAP 4 (L) 02/27/2017   Lab Results  Component Value Date   CHOL 131  11/29/2017   Lab Results  Component Value Date   HDL 37 (L) 11/29/2017   Lab Results  Component Value Date   LDLCALC 52 11/29/2017   Lab Results  Component Value Date   TRIG 212 (H) 11/29/2017   Lab Results  Component Value Date   CHOLHDL 3.5 11/29/2017   Lab Results  Component Value Date   HGBA1C 5.6 11/29/2017      Assessment & Plan:   Problem List Items Addressed This Visit    None    Visit Diagnoses    HTN (hypertension), malignant       increase clonidine; DASH guidelines   Relevant Medications   cloNIDine (CATAPRES) 0.3 MG tablet      Meds ordered this encounter  Medications  . cloNIDine (CATAPRES) 0.3 MG tablet    Sig: Take 1 tablet (0.3 mg total) by mouth 2 (two) times daily.    Dispense:  60 tablet    Refill:  3    LF 12/20/17 - PT SHOULD BE OUT   HTN: Not controlled.  Increase Clonidine to 0.3 Reviewed signs and symptom of CVA.   Hypercalcemia:  Elevated at 11.4 Gave education information to manage   Refer to Nephrology for malignant HTN and Hypercalcemia.  Follow-up: Return in about 4 weeks (around 07/27/2018).   F/u in 4 weeks to monitor HTN.   Faiza Bansal [place]

## 2018-06-29 NOTE — Patient Instructions (Signed)
Avoid factors that can aggravate hypercalcemia, including thiazide diuretics and lithium carbonate therapy, volume depletion, prolonged bed rest or inactivity, and a high calcium diet (>1000 mg/day). Adequate hydration (at least six to eight glasses of water per day)

## 2018-07-27 ENCOUNTER — Encounter: Payer: Self-pay | Admitting: Adult Health

## 2018-07-27 ENCOUNTER — Ambulatory Visit: Payer: Self-pay | Admitting: Adult Health

## 2018-07-27 VITALS — BP 170/87 | HR 69 | Ht 61.0 in | Wt 163.3 lb

## 2018-07-27 DIAGNOSIS — Z720 Tobacco use: Secondary | ICD-10-CM

## 2018-07-27 DIAGNOSIS — J42 Unspecified chronic bronchitis: Secondary | ICD-10-CM

## 2018-07-27 DIAGNOSIS — I10 Essential (primary) hypertension: Secondary | ICD-10-CM

## 2018-07-27 MED ORDER — CLONIDINE HCL 0.3 MG PO TABS: 0.3000 mg | ORAL_TABLET | Freq: Three times a day (TID) | ORAL | 3 refills | Status: DC

## 2018-07-27 MED ORDER — HYDRALAZINE HCL 25 MG PO TABS: 25.0000 mg | ORAL_TABLET | Freq: Two times a day (BID) | ORAL | 2 refills | Status: DC

## 2018-07-27 NOTE — Progress Notes (Signed)
Patient: Sheila [name] Female    DOB: 07/14/1961   57 y.o.   MRN: [id number] Visit Date: 07/27/2018  Today's Provider: Shawn RouteMagddalene S Tukov-Yual, NP   No chief complaint on file.  Subjective:    HPI  57 y/o current smoker presenting for re-evaluation of uncontrolled hypertension. BP remains >160/90. Her clonidine was increased to to 0.3mg  tid. She reports taking all medications as prescribed. She reports dizziness and occasional headaches that occur mostly when her blood pressure is high. She denies chest pain, palpations, nausea and vomiting  Allergies  Allergen Reactions  . Vicodin [Hydrocodone-Acetaminophen] Nausea And Vomiting  . Vicodin [Hydrocodone-Acetaminophen]     Abd pain, N/V  . Hct [Hydrochlorothiazide] Palpitations  . Tramadol Palpitations   Previous Medications   AMLODIPINE (NORVASC) 10 MG TABLET    Take 1 tablet (10 mg total) by mouth daily.   ASPIRIN EC 81 MG TABLET    Take by mouth.   ATORVASTATIN (LIPITOR) 10 MG TABLET    Take 1 tablet (10 mg total) by mouth daily.   CARVEDILOL (COREG) 25 MG TABLET    Take 1 tablet (25 mg total) by mouth 2 (two) times daily with a meal.   CLONIDINE (CATAPRES) 0.3 MG TABLET    Take 1 tablet (0.3 mg total) by mouth 2 (two) times daily.   CLOPIDOGREL (PLAVIX) 75 MG TABLET    Take 1 tablet (75 mg total) by mouth daily.   GABAPENTIN (NEURONTIN) 300 MG CAPSULE    TAKE ONE CAPSULE BY MOUTH AT BEDTIME   LISINOPRIL (PRINIVIL,ZESTRIL) 40 MG TABLET    Take 1 tablet (40 mg total) by mouth daily.   PANTOPRAZOLE (PROTONIX) 40 MG TABLET    Take 1 tablet (40 mg total) by mouth daily.    Review of Systems  Constitutional: Positive for fatigue.  Respiratory: Negative.   Cardiovascular: Negative.   Gastrointestinal: Negative.   Musculoskeletal: Negative.   Skin: Negative.   Neurological: Positive for dizziness, weakness and headaches. Negative for seizures and numbness.    Social History   Tobacco Use  . Smoking status: Current Every Day  Smoker    Packs/day: 1.00    Years: 30.00    Pack years: 30.00    Types: Cigarettes  . Smokeless tobacco: Never Used  Substance Use Topics  . Alcohol use: No    Alcohol/week: 0.0 standard drinks   Objective:   LMP 08/09/2008 (Within Years)   Physical Exam Vitals signs and nursing note reviewed.  Constitutional:      Appearance: She is normal weight.  Eyes:     Conjunctiva/sclera: Conjunctivae normal.     Pupils: Pupils are equal, round, and reactive to light.  Cardiovascular:     Rate and Rhythm: Normal rate and regular rhythm.     Pulses: Normal pulses.     Heart sounds: Normal heart sounds.  Pulmonary:     Effort: Pulmonary effort is normal.     Breath sounds: Normal breath sounds.  Abdominal:     General: Bowel sounds are normal.     Palpations: Abdomen is soft.  Musculoskeletal: Normal range of motion.  Neurological:     Mental Status: She is alert.       Assessment & Plan:     1. Malignant hypertension Increase clonidine to 0.3 mg tid and start hydralazine 25 mg bid. Monitor BP and bring a log to the clinic with medications in 1 week. Go to the ED if sudden onset speech difficulty or weakness  2.  Chronic bronchitis, unspecified chronic bronchitis type (HCC) Continue current medictions  3. Tobacco abuse Smoking cessation advice given  Shawn RouteMagddalene S Tukov-Yual, NP   [facility]

## 2018-07-28 ENCOUNTER — Encounter: Payer: Self-pay | Admitting: Adult Health

## 2018-08-10 ENCOUNTER — Other Ambulatory Visit: Payer: Self-pay

## 2018-08-10 ENCOUNTER — Encounter: Payer: Self-pay | Admitting: Gerontology

## 2018-08-10 ENCOUNTER — Ambulatory Visit: Payer: Self-pay | Admitting: Gerontology

## 2018-08-10 VITALS — BP 200/90 | HR 69 | Ht 62.0 in | Wt 158.9 lb

## 2018-08-10 DIAGNOSIS — I10 Essential (primary) hypertension: Secondary | ICD-10-CM

## 2018-08-10 DIAGNOSIS — Z0000 Encounter for general adult medical examination without abnormal findings: Secondary | ICD-10-CM

## 2018-08-10 DIAGNOSIS — K219 Gastro-esophageal reflux disease without esophagitis: Secondary | ICD-10-CM

## 2018-08-10 DIAGNOSIS — Z1231 Encounter for screening mammogram for malignant neoplasm of breast: Secondary | ICD-10-CM | POA: Insufficient documentation

## 2018-08-10 DIAGNOSIS — Z720 Tobacco use: Secondary | ICD-10-CM

## 2018-08-10 NOTE — Patient Instructions (Signed)
DASH Eating Plan DASH stands for "Dietary Approaches to Stop Hypertension." The DASH eating plan is a healthy eating plan that has been shown to reduce high blood pressure (hypertension). It may also reduce your risk for type 2 diabetes, heart disease, and stroke. The DASH eating plan may also help with weight loss. What are tips for following this plan?  General guidelines  Avoid eating more than 2,300 mg (milligrams) of salt (sodium) a day. If you have hypertension, you may need to reduce your sodium intake to 1,500 mg a day.  Limit alcohol intake to no more than 1 drink a day for nonpregnant women and 2 drinks a day for men. One drink equals 12 oz of beer, 5 oz of wine, or 1 oz of hard liquor.  Work with your health care provider to maintain a healthy body weight or to lose weight. Ask what an ideal weight is for you.  Get at least 30 minutes of exercise that causes your heart to beat faster (aerobic exercise) most days of the week. Activities may include walking, swimming, or biking.  Work with your health care provider or diet and nutrition specialist (dietitian) to adjust your eating plan to your individual calorie needs. Reading food labels   Check food labels for the amount of sodium per serving. Choose foods with less than 5 percent of the Daily Value of sodium. Generally, foods with less than 300 mg of sodium per serving fit into this eating plan.  To find whole grains, look for the word "whole" as the first word in the ingredient list. Shopping  Buy products labeled as "low-sodium" or "no salt added."  Buy fresh foods. Avoid canned foods and premade or frozen meals. Cooking  Avoid adding salt when cooking. Use salt-free seasonings or herbs instead of table salt or sea salt. Check with your health care provider or pharmacist before using salt substitutes.  Do not fry foods. Cook foods using healthy methods such as baking, boiling, grilling, and broiling instead.  Cook with  heart-healthy oils, such as olive, canola, soybean, or sunflower oil. Meal planning  Eat a balanced diet that includes: ? 5 or more servings of fruits and vegetables each day. At each meal, try to fill half of your plate with fruits and vegetables. ? Up to 6-8 servings of whole grains each day. ? Less than 6 oz of lean meat, poultry, or fish each day. A 3-oz serving of meat is about the same size as a deck of cards. One egg equals 1 oz. ? 2 servings of low-fat dairy each day. ? A serving of nuts, seeds, or beans 5 times each week. ? Heart-healthy fats. Healthy fats called Omega-3 fatty acids are found in foods such as flaxseeds and coldwater fish, like sardines, salmon, and mackerel.  Limit how much you eat of the following: ? Canned or prepackaged foods. ? Food that is high in trans fat, such as fried foods. ? Food that is high in saturated fat, such as fatty meat. ? Sweets, desserts, sugary drinks, and other foods with added sugar. ? Full-fat dairy products.  Do not salt foods before eating.  Try to eat at least 2 vegetarian meals each week.  Eat more home-cooked food and less restaurant, buffet, and fast food.  When eating at a restaurant, ask that your food be prepared with less salt or no salt, if possible. What foods are recommended? The items listed may not be a complete list. Talk with your dietitian about   what dietary choices are best for you. Grains Whole-grain or whole-wheat bread. Whole-grain or whole-wheat pasta. Brown rice. Oatmeal. Quinoa. Bulgur. Whole-grain and low-sodium cereals. Pita bread. Low-fat, low-sodium crackers. Whole-wheat flour tortillas. Vegetables Fresh or frozen vegetables (raw, steamed, roasted, or grilled). Low-sodium or reduced-sodium tomato and vegetable juice. Low-sodium or reduced-sodium tomato sauce and tomato paste. Low-sodium or reduced-sodium canned vegetables. Fruits All fresh, dried, or frozen fruit. Canned fruit in natural juice (without  added sugar). Meat and other protein foods Skinless chicken or turkey. Ground chicken or turkey. Pork with fat trimmed off. Fish and seafood. Egg whites. Dried beans, peas, or lentils. Unsalted nuts, nut butters, and seeds. Unsalted canned beans. Lean cuts of beef with fat trimmed off. Low-sodium, lean deli meat. Dairy Low-fat (1%) or fat-free (skim) milk. Fat-free, low-fat, or reduced-fat cheeses. Nonfat, low-sodium ricotta or cottage cheese. Low-fat or nonfat yogurt. Low-fat, low-sodium cheese. Fats and oils Soft margarine without trans fats. Vegetable oil. Low-fat, reduced-fat, or light mayonnaise and salad dressings (reduced-sodium). Canola, safflower, olive, soybean, and sunflower oils. Avocado. Seasoning and other foods Herbs. Spices. Seasoning mixes without salt. Unsalted popcorn and pretzels. Fat-free sweets. What foods are not recommended? The items listed may not be a complete list. Talk with your dietitian about what dietary choices are best for you. Grains Baked goods made with fat, such as croissants, muffins, or some breads. Dry pasta or rice meal packs. Vegetables Creamed or fried vegetables. Vegetables in a cheese sauce. Regular canned vegetables (not low-sodium or reduced-sodium). Regular canned tomato sauce and paste (not low-sodium or reduced-sodium). Regular tomato and vegetable juice (not low-sodium or reduced-sodium). Pickles. Olives. Fruits Canned fruit in a light or heavy syrup. Fried fruit. Fruit in cream or butter sauce. Meat and other protein foods Fatty cuts of meat. Ribs. Fried meat. Bacon. Sausage. Bologna and other processed lunch meats. Salami. Fatback. Hotdogs. Bratwurst. Salted nuts and seeds. Canned beans with added salt. Canned or smoked fish. Whole eggs or egg yolks. Chicken or turkey with skin. Dairy Whole or 2% milk, cream, and half-and-half. Whole or full-fat cream cheese. Whole-fat or sweetened yogurt. Full-fat cheese. Nondairy creamers. Whipped toppings.  Processed cheese and cheese spreads. Fats and oils Butter. Stick margarine. Lard. Shortening. Ghee. Bacon fat. Tropical oils, such as coconut, palm kernel, or palm oil. Seasoning and other foods Salted popcorn and pretzels. Onion salt, garlic salt, seasoned salt, table salt, and sea salt. Worcestershire sauce. Tartar sauce. Barbecue sauce. Teriyaki sauce. Soy sauce, including reduced-sodium. Steak sauce. Canned and packaged gravies. Fish sauce. Oyster sauce. Cocktail sauce. Horseradish that you find on the shelf. Ketchup. Mustard. Meat flavorings and tenderizers. Bouillon cubes. Hot sauce and Tabasco sauce. Premade or packaged marinades. Premade or packaged taco seasonings. Relishes. Regular salad dressings. Where to find more information:  National Heart, Lung, and Blood Institute: www.nhlbi.nih.gov  American Heart Association: www.heart.org Summary  The DASH eating plan is a healthy eating plan that has been shown to reduce high blood pressure (hypertension). It may also reduce your risk for type 2 diabetes, heart disease, and stroke.  With the DASH eating plan, you should limit salt (sodium) intake to 2,300 mg a day. If you have hypertension, you may need to reduce your sodium intake to 1,500 mg a day.  When on the DASH eating plan, aim to eat more fresh fruits and vegetables, whole grains, lean proteins, low-fat dairy, and heart-healthy fats.  Work with your health care provider or diet and nutrition specialist (dietitian) to adjust your eating plan to your   individual calorie needs. This information is not intended to replace advice given to you by your health care provider. Make sure you discuss any questions you have with your health care provider. Document Released: 07/15/2011 Document Revised: 07/19/2016 Document Reviewed: 07/19/2016 Elsevier Interactive Patient Education  2019 Elsevier Inc. Smoking Tobacco Information, Adult Smoking tobacco can be harmful to your health. Tobacco  contains a poisonous (toxic), colorless chemical called nicotine. Nicotine is addictive. It changes the brain and can make it hard to stop smoking. Tobacco also has other toxic chemicals that can hurt your body and raise your risk of many cancers. How can smoking tobacco affect me? Smoking tobacco puts you at risk for:  Cancer. Smoking is most commonly associated with lung cancer, but can also lead to cancer in other parts of the body.  Chronic obstructive pulmonary disease (COPD). This is a long-term lung condition that makes it hard to breathe. It also gets worse over time.  High blood pressure (hypertension), heart disease, stroke, or heart attack.  Lung infections, such as pneumonia.  Cataracts. This is when the lenses in the eyes become clouded.  Digestive problems. This may include peptic ulcers, heartburn, and gastroesophageal reflux disease (GERD).  Oral health problems, such as gum disease and tooth loss.  Loss of taste and smell. Smoking can affect your appearance by causing:  Wrinkles.  Yellow or stained teeth, fingers, and fingernails. Smoking tobacco can also affect your social life, because:  It may be challenging to find places to smoke when away from home. Many workplaces, restaurants, hotels, and public places are tobacco-free.  Smoking is expensive. This is due to the cost of tobacco and the long-term costs of treating health problems from smoking.  Secondhand smoke may affect those around you. Secondhand smoke can cause lung cancer, breathing problems, and heart disease. Children of smokers have a higher risk for: ? Sudden infant death syndrome (SIDS). ? Ear infections. ? Lung infections. If you currently smoke tobacco, quitting now can help you:  Lead a longer and healthier life.  Look, smell, breathe, and feel better over time.  Save money.  Protect others from the harms of secondhand smoke. What actions can I take to prevent health problems? Quit  smoking   Do not start smoking. Quit if you already do.  Make a plan to quit smoking and commit to it. Look for programs to help you and ask your health care provider for recommendations and ideas.  Set a date and write down all the reasons you want to quit.  Let your friends and family know you are quitting so they can help and support you. Consider finding friends who also want to quit. It can be easier to quit with someone else, so that you can support each other.  Talk with your health care provider about using nicotine replacement medicines to help you quit, such as gum, lozenges, patches, sprays, or pills.  Do not replace cigarette smoking with electronic cigarettes, which are commonly called e-cigarettes. The safety of e-cigarettes is not known, and some may contain harmful chemicals.  If you try to quit but return to smoking, stay positive. It is common to slip up when you first quit, so take it one day at a time.  Be prepared for cravings. When you feel the urge to smoke, chew gum or suck on hard candy. Lifestyle  Stay busy and take care of your body.  Drink enough fluid to keep your urine pale yellow.  Get plenty of   exercise and eat a healthy diet. This can help prevent weight gain after quitting.  Monitor your eating habits. Quitting smoking can cause you to have a larger appetite than when you smoke.  Find ways to relax. Go out with friends or family to a movie or a restaurant where people do not smoke.  Ask your health care provider about having regular tests (screenings) to check for cancer. This may include blood tests, imaging tests, and other tests.  Find ways to manage your stress, such as meditation, yoga, or exercise. Where to find support To get support to quit smoking, consider:  Asking your health care provider for more information and resources.  Taking classes to learn more about quitting smoking.  Looking for local organizations that offer resources  about quitting smoking.  Joining a support group for people who want to quit smoking in your local community.  Calling the smokefree.gov counselor helpline: 1-800-Quit-Now (1-800-784-8669) Where to find more information You may find more information about quitting smoking from:  HelpGuide.org: www.helpguide.org  Smokefree.gov: smokefree.gov  American Lung Association: www.lung.org Contact a health care provider if you:  Have problems breathing.  Notice that your lips, nose, or fingers turn blue.  Have chest pain.  Are coughing up blood.  Feel faint or you pass out.  Have other health changes that cause you to worry. Summary  Smoking tobacco can negatively affect your health, the health of those around you, your finances, and your social life.  Do not start smoking. Quit if you already do. If you need help quitting, ask your health care provider.  Think about joining a support group for people who want to quit smoking in your local community. There are many effective programs that will help you to quit this behavior. This information is not intended to replace advice given to you by your health care provider. Make sure you discuss any questions you have with your health care provider. Document Released: 08/10/2016 Document Revised: 09/14/2017 Document Reviewed: 08/10/2016 Elsevier Interactive Patient Education  2019 Elsevier Inc.  

## 2018-08-10 NOTE — Progress Notes (Signed)
Patient: Sheila [name] Female    DOB: 02/14/1961   58 y.o.   MRN: [id number] Visit Date: 08/10/2018  Today's Provider: Rolm Galahioma E Pax Reasoner, NP   Chief Complaint  Patient presents with  . Follow-up    high blood pressure   Subjective:    HPI Sheila [name] is a 58 y/o female that presents for evaluation of her uncontrolled hypertension. She reports not taking her blood pressure medicine in the mornings when she has to drive, and has missed taking it at least 1 time during last week. She admits to taking her BP medicines as ordered, but sometimes take 0.3 mg Coreg 2 times daily instead of TID. She reports being lightheaded sometimes, but denies syncopal episodes, fall, chest pain,palpitation, shortness of breath.  She reports smoking 1 pack of cigarette a day and admits the desire to quit.     Allergies  Allergen Reactions  . Vicodin [Hydrocodone-Acetaminophen] Nausea And Vomiting  . Vicodin [Hydrocodone-Acetaminophen]     Abd pain, N/V  . Hct [Hydrochlorothiazide] Palpitations  . Tramadol Palpitations   Previous Medications   AMLODIPINE (NORVASC) 10 MG TABLET    Take 1 tablet (10 mg total) by mouth daily.   ASPIRIN EC 81 MG TABLET    Take by mouth.   ATORVASTATIN (LIPITOR) 10 MG TABLET    Take 1 tablet (10 mg total) by mouth daily.   CARVEDILOL (COREG) 25 MG TABLET    Take 1 tablet (25 mg total) by mouth 2 (two) times daily with a meal.   CLONIDINE (CATAPRES) 0.3 MG TABLET    Take 1 tablet (0.3 mg total) by mouth 3 (three) times daily.   CLOPIDOGREL (PLAVIX) 75 MG TABLET    Take 1 tablet (75 mg total) by mouth daily.   GABAPENTIN (NEURONTIN) 300 MG CAPSULE    TAKE ONE CAPSULE BY MOUTH AT BEDTIME   HYDRALAZINE (APRESOLINE) 25 MG TABLET    Take 1 tablet (25 mg total) by mouth 2 (two) times daily.   LISINOPRIL (PRINIVIL,ZESTRIL) 40 MG TABLET    Take 1 tablet (40 mg total) by mouth daily.   PANTOPRAZOLE (PROTONIX) 40 MG TABLET    Take 1 tablet (40 mg total) by mouth daily.    Review of  Systems  Constitutional: Negative.   HENT: Negative.   Eyes: Negative.   Respiratory: Negative.   Cardiovascular: Negative.   Gastrointestinal: Negative.   Genitourinary: Negative.   Musculoskeletal: Negative.   Skin: Negative.   Neurological: Positive for light-headedness.  Psychiatric/Behavioral: Negative.     Social History   Tobacco Use  . Smoking status: Current Every Day Smoker    Packs/day: 1.00    Years: 30.00    Pack years: 30.00    Types: Cigarettes  . Smokeless tobacco: Never Used  Substance Use Topics  . Alcohol use: No    Alcohol/week: 0.0 standard drinks   Objective:   BP (!) 200/90 (BP Location: Right Arm, Patient Position: Sitting, Cuff Size: Large)   Pulse 69   Ht 5\' 2"  (1.575 m)   Wt 158 lb 14.4 oz (72.1 kg)   LMP 08/09/2008 (Within Years)   SpO2 97%   BMI 29.06 kg/m   Physical Exam Constitutional:      Appearance: Normal appearance.  HENT:     Head: Normocephalic and atraumatic.  Eyes:     Extraocular Movements: Extraocular movements intact.     Pupils: Pupils are equal, round, and reactive to light.  Neck:     Musculoskeletal: Normal  range of motion.  Cardiovascular:     Rate and Rhythm: Normal rate and regular rhythm.  Pulmonary:     Effort: Pulmonary effort is normal.     Breath sounds: Normal breath sounds.  Musculoskeletal: Normal range of motion.  Skin:    General: Skin is warm and dry.  Neurological:     General: No focal deficit present.     Mental Status: She is alert and oriented to person, place, and time.  Psychiatric:        Mood and Affect: Mood normal.        Behavior: Behavior normal.         Assessment & Plan:         1. Malignant hypertension - Ms. Gravely has uncontrolled hypertension due to non compliance with medication, BP was rechecked during visit and it was 200/90, she was advised to take BP medicines when she gets home, monitor and document BP at home and bring log to clinic next week. She was advised  to continue on low salt diet - She was advised to go to the nearest ED with worsening headache, dizziness, visual changes.  2. Tobacco abuse - She was strongly advised on smoking cessation - [place] quit line brochure was provided.  3. Gastroesophageal reflux disease without esophagitis - She continues to take 40 mg Protonix daily with relief.  4. Health care maintenance - Follow up in 1 week     Shaleka Brines Trellis Paganini, NP   [facility]

## 2018-08-17 ENCOUNTER — Other Ambulatory Visit: Payer: Self-pay

## 2018-08-17 ENCOUNTER — Encounter: Payer: Self-pay | Admitting: Gerontology

## 2018-08-17 ENCOUNTER — Ambulatory Visit: Payer: Self-pay | Admitting: Gerontology

## 2018-08-17 VITALS — BP 137/80 | HR 55 | Wt 162.9 lb

## 2018-08-17 DIAGNOSIS — Z720 Tobacco use: Secondary | ICD-10-CM

## 2018-08-17 DIAGNOSIS — I10 Essential (primary) hypertension: Secondary | ICD-10-CM

## 2018-08-17 DIAGNOSIS — Z0000 Encounter for general adult medical examination without abnormal findings: Secondary | ICD-10-CM

## 2018-08-17 MED ORDER — CARVEDILOL 25 MG PO TABS: 12.5000 mg | ORAL_TABLET | Freq: Two times a day (BID) | ORAL | 5 refills | Status: DC

## 2018-08-17 NOTE — Progress Notes (Signed)
Patient: IZABELLAH DADISMAN Female    DOB: 1961-05-15   58 y.o.   MRN: [id number] Visit Date: 08/17/2018  Today's Provider: Langston Reusing, NP   Chief Complaint  Patient presents with  . Follow-up    hypertension   Subjective:    HPI  Ms. Everlean Cherry 58 y/o female presents for f/u of uncontrolled hypertension. She reports that she takes her blood pressure medicine as prescribed, and she admits to checking her BP at home, but did not bring her BP log.She stated that her BP was 146/92 this morning prior to clinic visit.  She admits to cutting back on smoking to 1/2 pack a day and has increased her water intake. She admits to eating low salt diet. She denies chest pain, palpitation, light headedness, peripheral edema, shortness, fever, chills. Otherwise she reports that she's doing well.    Allergies  Allergen Reactions  . Vicodin [Hydrocodone-Acetaminophen] Nausea And Vomiting  . Vicodin [Hydrocodone-Acetaminophen]     Abd pain, N/V  . Hct [Hydrochlorothiazide] Palpitations  . Tramadol Palpitations   Previous Medications   AMLODIPINE (NORVASC) 10 MG TABLET    Take 1 tablet (10 mg total) by mouth daily.   ASPIRIN EC 81 MG TABLET    Take by mouth.   ATORVASTATIN (LIPITOR) 10 MG TABLET    Take 1 tablet (10 mg total) by mouth daily.   CLONIDINE (CATAPRES) 0.3 MG TABLET    Take 1 tablet (0.3 mg total) by mouth 3 (three) times daily.   CLOPIDOGREL (PLAVIX) 75 MG TABLET    Take 1 tablet (75 mg total) by mouth daily.   GABAPENTIN (NEURONTIN) 300 MG CAPSULE    TAKE ONE CAPSULE BY MOUTH AT BEDTIME   HYDRALAZINE (APRESOLINE) 25 MG TABLET    Take 1 tablet (25 mg total) by mouth 2 (two) times daily.   LISINOPRIL (PRINIVIL,ZESTRIL) 40 MG TABLET    Take 1 tablet (40 mg total) by mouth daily.   PANTOPRAZOLE (PROTONIX) 40 MG TABLET    Take 1 tablet (40 mg total) by mouth daily.    Review of Systems  Constitutional: Negative.   HENT: Positive for sinus pressure.   Eyes: Negative.   Respiratory:  Negative.   Cardiovascular: Negative.   Gastrointestinal: Negative.   Endocrine: Negative.   Genitourinary: Negative.   Musculoskeletal: Negative.   Skin: Negative.   Neurological: Negative.   Psychiatric/Behavioral: Negative.     Social History   Tobacco Use  . Smoking status: Current Every Day Smoker    Packs/day: 1.00    Years: 30.00    Pack years: 30.00    Types: Cigarettes  . Smokeless tobacco: Never Used  Substance Use Topics  . Alcohol use: No    Alcohol/week: 0.0 standard drinks   Objective:   BP 137/80 (BP Location: Right Arm, Patient Position: Sitting, Cuff Size: Large)   Pulse (!) 55   Wt 162 lb 14.4 oz (73.9 kg)   LMP 08/09/2008 (Within Years)   SpO2 99%   BMI 29.79 kg/m   Physical Exam Constitutional:      Appearance: Normal appearance.  HENT:     Head: Normocephalic and atraumatic.     Mouth/Throat:     Mouth: Mucous membranes are moist.  Eyes:     Extraocular Movements: Extraocular movements intact.     Pupils: Pupils are equal, round, and reactive to light.  Neck:     Musculoskeletal: Normal range of motion.  Cardiovascular:     Rate and Rhythm: Regular rhythm.  Bradycardia present.     Pulses: Normal pulses.     Heart sounds: Normal heart sounds.  Pulmonary:     Effort: Pulmonary effort is normal.     Breath sounds: Normal breath sounds.  Abdominal:     General: Bowel sounds are normal.     Palpations: Abdomen is soft.  Musculoskeletal: Normal range of motion.  Skin:    General: Skin is warm.  Neurological:     General: No focal deficit present.     Mental Status: She is alert and oriented to person, place, and time.  Psychiatric:        Mood and Affect: Mood normal.        Behavior: Behavior normal.        Thought Content: Thought content normal.        Judgment: Judgment normal.         Assessment & Plan:         1. Malignant hypertension - Ms Barlowe has greatly improved in adhering to her medication regimen. Her BP was  rechecked during visit and it was 137/80. - She was advised to check blood pressure at home, keep log and bring to clinic during her next visit. She was advised to continue on low salt diet.  - Urine Microalbumin w/creat. Ratio to evaluate for hypertensive nephropathy. - Ms. Gravely's Coreg was decreased to 12.5 mg bid due to bradycardia. - Urinalysis - Comp Met (CMET)  2. Tobacco abuse - She was encouraged on smoking cessation  3. Health care maintenance  - CBC w/Diff - Lipid Profile - TSH - HgB A1c - Cytology - PAP( [place]  - Mammogram was referred. - Follow up in 1 month   Chioma E Iloabachie, NP   [facility]  

## 2018-08-17 NOTE — Patient Instructions (Signed)
DASH Eating Plan DASH stands for "Dietary Approaches to Stop Hypertension." The DASH eating plan is a healthy eating plan that has been shown to reduce high blood pressure (hypertension). It may also reduce your risk for type 2 diabetes, heart disease, and stroke. The DASH eating plan may also help with weight loss. What are tips for following this plan?  General guidelines  Avoid eating more than 2,300 mg (milligrams) of salt (sodium) a day. If you have hypertension, you may need to reduce your sodium intake to 1,500 mg a day.  Limit alcohol intake to no more than 1 drink a day for nonpregnant women and 2 drinks a day for men. One drink equals 12 oz of beer, 5 oz of wine, or 1 oz of hard liquor.  Work with your health care provider to maintain a healthy body weight or to lose weight. Ask what an ideal weight is for you.  Get at least 30 minutes of exercise that causes your heart to beat faster (aerobic exercise) most days of the week. Activities may include walking, swimming, or biking.  Work with your health care provider or diet and nutrition specialist (dietitian) to adjust your eating plan to your individual calorie needs. Reading food labels   Check food labels for the amount of sodium per serving. Choose foods with less than 5 percent of the Daily Value of sodium. Generally, foods with less than 300 mg of sodium per serving fit into this eating plan.  To find whole grains, look for the word "whole" as the first word in the ingredient list. Shopping  Buy products labeled as "low-sodium" or "no salt added."  Buy fresh foods. Avoid canned foods and premade or frozen meals. Cooking  Avoid adding salt when cooking. Use salt-free seasonings or herbs instead of table salt or sea salt. Check with your health care provider or pharmacist before using salt substitutes.  Do not fry foods. Cook foods using healthy methods such as baking, boiling, grilling, and broiling instead.  Cook with  heart-healthy oils, such as olive, canola, soybean, or sunflower oil. Meal planning  Eat a balanced diet that includes: ? 5 or more servings of fruits and vegetables each day. At each meal, try to fill half of your plate with fruits and vegetables. ? Up to 6-8 servings of whole grains each day. ? Less than 6 oz of lean meat, poultry, or fish each day. A 3-oz serving of meat is about the same size as a deck of cards. One egg equals 1 oz. ? 2 servings of low-fat dairy each day. ? A serving of nuts, seeds, or beans 5 times each week. ? Heart-healthy fats. Healthy fats called Omega-3 fatty acids are found in foods such as flaxseeds and coldwater fish, like sardines, salmon, and mackerel.  Limit how much you eat of the following: ? Canned or prepackaged foods. ? Food that is high in trans fat, such as fried foods. ? Food that is high in saturated fat, such as fatty meat. ? Sweets, desserts, sugary drinks, and other foods with added sugar. ? Full-fat dairy products.  Do not salt foods before eating.  Try to eat at least 2 vegetarian meals each week.  Eat more home-cooked food and less restaurant, buffet, and fast food.  When eating at a restaurant, ask that your food be prepared with less salt or no salt, if possible. What foods are recommended? The items listed may not be a complete list. Talk with your dietitian about   what dietary choices are best for you. Grains Whole-grain or whole-wheat bread. Whole-grain or whole-wheat pasta. Brown rice. Oatmeal. Quinoa. Bulgur. Whole-grain and low-sodium cereals. Pita bread. Low-fat, low-sodium crackers. Whole-wheat flour tortillas. Vegetables Fresh or frozen vegetables (raw, steamed, roasted, or grilled). Low-sodium or reduced-sodium tomato and vegetable juice. Low-sodium or reduced-sodium tomato sauce and tomato paste. Low-sodium or reduced-sodium canned vegetables. Fruits All fresh, dried, or frozen fruit. Canned fruit in natural juice (without  added sugar). Meat and other protein foods Skinless chicken or turkey. Ground chicken or turkey. Pork with fat trimmed off. Fish and seafood. Egg whites. Dried beans, peas, or lentils. Unsalted nuts, nut butters, and seeds. Unsalted canned beans. Lean cuts of beef with fat trimmed off. Low-sodium, lean deli meat. Dairy Low-fat (1%) or fat-free (skim) milk. Fat-free, low-fat, or reduced-fat cheeses. Nonfat, low-sodium ricotta or cottage cheese. Low-fat or nonfat yogurt. Low-fat, low-sodium cheese. Fats and oils Soft margarine without trans fats. Vegetable oil. Low-fat, reduced-fat, or light mayonnaise and salad dressings (reduced-sodium). Canola, safflower, olive, soybean, and sunflower oils. Avocado. Seasoning and other foods Herbs. Spices. Seasoning mixes without salt. Unsalted popcorn and pretzels. Fat-free sweets. What foods are not recommended? The items listed may not be a complete list. Talk with your dietitian about what dietary choices are best for you. Grains Baked goods made with fat, such as croissants, muffins, or some breads. Dry pasta or rice meal packs. Vegetables Creamed or fried vegetables. Vegetables in a cheese sauce. Regular canned vegetables (not low-sodium or reduced-sodium). Regular canned tomato sauce and paste (not low-sodium or reduced-sodium). Regular tomato and vegetable juice (not low-sodium or reduced-sodium). Pickles. Olives. Fruits Canned fruit in a light or heavy syrup. Fried fruit. Fruit in cream or butter sauce. Meat and other protein foods Fatty cuts of meat. Ribs. Fried meat. Bacon. Sausage. Bologna and other processed lunch meats. Salami. Fatback. Hotdogs. Bratwurst. Salted nuts and seeds. Canned beans with added salt. Canned or smoked fish. Whole eggs or egg yolks. Chicken or turkey with skin. Dairy Whole or 2% milk, cream, and half-and-half. Whole or full-fat cream cheese. Whole-fat or sweetened yogurt. Full-fat cheese. Nondairy creamers. Whipped toppings.  Processed cheese and cheese spreads. Fats and oils Butter. Stick margarine. Lard. Shortening. Ghee. Bacon fat. Tropical oils, such as coconut, palm kernel, or palm oil. Seasoning and other foods Salted popcorn and pretzels. Onion salt, garlic salt, seasoned salt, table salt, and sea salt. Worcestershire sauce. Tartar sauce. Barbecue sauce. Teriyaki sauce. Soy sauce, including reduced-sodium. Steak sauce. Canned and packaged gravies. Fish sauce. Oyster sauce. Cocktail sauce. Horseradish that you find on the shelf. Ketchup. Mustard. Meat flavorings and tenderizers. Bouillon cubes. Hot sauce and Tabasco sauce. Premade or packaged marinades. Premade or packaged taco seasonings. Relishes. Regular salad dressings. Where to find more information:  National Heart, Lung, and Blood Institute: www.nhlbi.nih.gov  American Heart Association: www.heart.org Summary  The DASH eating plan is a healthy eating plan that has been shown to reduce high blood pressure (hypertension). It may also reduce your risk for type 2 diabetes, heart disease, and stroke.  With the DASH eating plan, you should limit salt (sodium) intake to 2,300 mg a day. If you have hypertension, you may need to reduce your sodium intake to 1,500 mg a day.  When on the DASH eating plan, aim to eat more fresh fruits and vegetables, whole grains, lean proteins, low-fat dairy, and heart-healthy fats.  Work with your health care provider or diet and nutrition specialist (dietitian) to adjust your eating plan to your   individual calorie needs. This information is not intended to replace advice given to you by your health care provider. Make sure you discuss any questions you have with your health care provider. Document Released: 07/15/2011 Document Revised: 07/19/2016 Document Reviewed: 07/19/2016 Elsevier Interactive Patient Education  2019 Elsevier Inc. Smoking Tobacco Information, Adult Smoking tobacco can be harmful to your health. Tobacco  contains a poisonous (toxic), colorless chemical called nicotine. Nicotine is addictive. It changes the brain and can make it hard to stop smoking. Tobacco also has other toxic chemicals that can hurt your body and raise your risk of many cancers. How can smoking tobacco affect me? Smoking tobacco puts you at risk for:  Cancer. Smoking is most commonly associated with lung cancer, but can also lead to cancer in other parts of the body.  Chronic obstructive pulmonary disease (COPD). This is a long-term lung condition that makes it hard to breathe. It also gets worse over time.  High blood pressure (hypertension), heart disease, stroke, or heart attack.  Lung infections, such as pneumonia.  Cataracts. This is when the lenses in the eyes become clouded.  Digestive problems. This may include peptic ulcers, heartburn, and gastroesophageal reflux disease (GERD).  Oral health problems, such as gum disease and tooth loss.  Loss of taste and smell. Smoking can affect your appearance by causing:  Wrinkles.  Yellow or stained teeth, fingers, and fingernails. Smoking tobacco can also affect your social life, because:  It may be challenging to find places to smoke when away from home. Many workplaces, restaurants, hotels, and public places are tobacco-free.  Smoking is expensive. This is due to the cost of tobacco and the long-term costs of treating health problems from smoking.  Secondhand smoke may affect those around you. Secondhand smoke can cause lung cancer, breathing problems, and heart disease. Children of smokers have a higher risk for: ? Sudden infant death syndrome (SIDS). ? Ear infections. ? Lung infections. If you currently smoke tobacco, quitting now can help you:  Lead a longer and healthier life.  Look, smell, breathe, and feel better over time.  Save money.  Protect others from the harms of secondhand smoke. What actions can I take to prevent health problems? Quit  smoking   Do not start smoking. Quit if you already do.  Make a plan to quit smoking and commit to it. Look for programs to help you and ask your health care provider for recommendations and ideas.  Set a date and write down all the reasons you want to quit.  Let your friends and family know you are quitting so they can help and support you. Consider finding friends who also want to quit. It can be easier to quit with someone else, so that you can support each other.  Talk with your health care provider about using nicotine replacement medicines to help you quit, such as gum, lozenges, patches, sprays, or pills.  Do not replace cigarette smoking with electronic cigarettes, which are commonly called e-cigarettes. The safety of e-cigarettes is not known, and some may contain harmful chemicals.  If you try to quit but return to smoking, stay positive. It is common to slip up when you first quit, so take it one day at a time.  Be prepared for cravings. When you feel the urge to smoke, chew gum or suck on hard candy. Lifestyle  Stay busy and take care of your body.  Drink enough fluid to keep your urine pale yellow.  Get plenty of   exercise and eat a healthy diet. This can help prevent weight gain after quitting.  Monitor your eating habits. Quitting smoking can cause you to have a larger appetite than when you smoke.  Find ways to relax. Go out with friends or family to a movie or a restaurant where people do not smoke.  Ask your health care provider about having regular tests (screenings) to check for cancer. This may include blood tests, imaging tests, and other tests.  Find ways to manage your stress, such as meditation, yoga, or exercise. Where to find support To get support to quit smoking, consider:  Asking your health care provider for more information and resources.  Taking classes to learn more about quitting smoking.  Looking for local organizations that offer resources  about quitting smoking.  Joining a support group for people who want to quit smoking in your local community.  Calling the smokefree.gov counselor helpline: 1-800-Quit-Now (1-800-784-8669) Where to find more information You may find more information about quitting smoking from:  HelpGuide.org: www.helpguide.org  Smokefree.gov: smokefree.gov  American Lung Association: www.lung.org Contact a health care provider if you:  Have problems breathing.  Notice that your lips, nose, or fingers turn blue.  Have chest pain.  Are coughing up blood.  Feel faint or you pass out.  Have other health changes that cause you to worry. Summary  Smoking tobacco can negatively affect your health, the health of those around you, your finances, and your social life.  Do not start smoking. Quit if you already do. If you need help quitting, ask your health care provider.  Think about joining a support group for people who want to quit smoking in your local community. There are many effective programs that will help you to quit this behavior. This information is not intended to replace advice given to you by your health care provider. Make sure you discuss any questions you have with your health care provider. Document Released: 08/10/2016 Document Revised: 09/14/2017 Document Reviewed: 08/10/2016 Elsevier Interactive Patient Education  2019 Elsevier Inc.  

## 2018-08-19 LAB — CBC WITH DIFFERENTIAL/PLATELET
Basophils Absolute: 0.1 10*3/uL (ref 0.0–0.2)
Basos: 1 %
EOS (ABSOLUTE): 0.2 10*3/uL (ref 0.0–0.4)
Eos: 2 %
Hematocrit: 38.8 % (ref 34.0–46.6)
Hemoglobin: 13.2 g/dL (ref 11.1–15.9)
IMMATURE GRANS (ABS): 0 10*3/uL (ref 0.0–0.1)
Immature Granulocytes: 0 %
LYMPHS: 36 %
Lymphocytes Absolute: 3.7 10*3/uL — ABNORMAL HIGH (ref 0.7–3.1)
MCH: 32 pg (ref 26.6–33.0)
MCHC: 34 g/dL (ref 31.5–35.7)
MCV: 94 fL (ref 79–97)
Monocytes Absolute: 0.8 10*3/uL (ref 0.1–0.9)
Monocytes: 8 %
Neutrophils Absolute: 5.4 10*3/uL (ref 1.4–7.0)
Neutrophils: 53 %
Platelets: 315 10*3/uL (ref 150–450)
RBC: 4.12 x10E6/uL (ref 3.77–5.28)
RDW: 12.2 % (ref 11.7–15.4)
WBC: 10.2 10*3/uL (ref 3.4–10.8)

## 2018-08-19 LAB — COMPREHENSIVE METABOLIC PANEL
ALT: 14 IU/L (ref 0–32)
AST: 13 IU/L (ref 0–40)
Albumin/Globulin Ratio: 1.9 (ref 1.2–2.2)
Albumin: 3.8 g/dL (ref 3.5–5.5)
Alkaline Phosphatase: 86 IU/L (ref 39–117)
BUN/Creatinine Ratio: 11 (ref 9–23)
BUN: 8 mg/dL (ref 6–24)
Bilirubin Total: <0.2 mg/dL (ref 0.0–1.2)
CO2: 21 mmol/L (ref 20–29)
Calcium: 10.9 mg/dL — ABNORMAL HIGH (ref 8.7–10.2)
Chloride: 111 mmol/L — ABNORMAL HIGH (ref 96–106)
Creatinine, Ser: 0.7 mg/dL (ref 0.57–1.00)
GFR calc Af Amer: 110 mL/min/{1.73_m2} (ref >59)
GFR calc non Af Amer: 96 mL/min/{1.73_m2} (ref >59)
Globulin, Total: 2 g/dL (ref 1.5–4.5)
Glucose: 89 mg/dL (ref 65–99)
Potassium: 4.1 mmol/L (ref 3.5–5.2)
Sodium: 144 mmol/L (ref 134–144)
Total Protein: 5.8 g/dL — ABNORMAL LOW (ref 6.0–8.5)

## 2018-08-19 LAB — URINALYSIS
BILIRUBIN UA: NEGATIVE
Glucose, UA: NEGATIVE
KETONES UA: NEGATIVE
Leukocytes, UA: NEGATIVE
NITRITE UA: POSITIVE — AB
PH UA: 6.5 (ref 5.0–7.5)
Protein, UA: NEGATIVE
RBC UA: NEGATIVE
Urobilinogen, Ur: 0.2 mg/dL (ref 0.2–1.0)

## 2018-08-19 LAB — LIPID PANEL
Chol/HDL Ratio: 2.9 ratio (ref 0.0–4.4)
Cholesterol, Total: 119 mg/dL (ref 100–199)
HDL: 41 mg/dL (ref >39)
LDL Calculated: 56 mg/dL (ref 0–99)
Triglycerides: 109 mg/dL (ref 0–149)
VLDL CHOLESTEROL CAL: 22 mg/dL (ref 5–40)

## 2018-08-19 LAB — MICROALBUMIN / CREATININE URINE RATIO
CREATININE, UR: 9.7 mg/dL
Microalbumin, Urine: 10 ug/mL

## 2018-08-19 LAB — TSH: TSH: 2.07 u[IU]/mL (ref 0.450–4.500)

## 2018-08-19 LAB — HEMOGLOBIN A1C
Est. average glucose Bld gHb Est-mCnc: 114 mg/dL
Hgb A1c MFr Bld: 5.6 % (ref 4.8–5.6)

## 2018-08-22 ENCOUNTER — Other Ambulatory Visit: Payer: Self-pay

## 2018-08-22 ENCOUNTER — Other Ambulatory Visit: Payer: Self-pay | Admitting: Gerontology

## 2018-08-22 DIAGNOSIS — N390 Urinary tract infection, site not specified: Secondary | ICD-10-CM

## 2018-08-23 LAB — URINALYSIS
Bilirubin, UA: NEGATIVE
Glucose, UA: NEGATIVE
Ketones, UA: NEGATIVE
LEUKOCYTES UA: NEGATIVE
Nitrite, UA: NEGATIVE
Protein, UA: NEGATIVE
Specific Gravity, UA: 1.01 (ref 1.005–1.030)
Urobilinogen, Ur: 0.2 mg/dL (ref 0.2–1.0)
pH, UA: 5.5 (ref 5.0–7.5)

## 2018-08-23 LAB — SPECIMEN STATUS REPORT

## 2018-09-19 ENCOUNTER — Ambulatory Visit (INDEPENDENT_AMBULATORY_CARE_PROVIDER_SITE_OTHER): Payer: Self-pay | Admitting: Vascular Surgery

## 2018-09-19 ENCOUNTER — Ambulatory Visit (INDEPENDENT_AMBULATORY_CARE_PROVIDER_SITE_OTHER): Payer: Self-pay

## 2018-09-19 ENCOUNTER — Encounter (INDEPENDENT_AMBULATORY_CARE_PROVIDER_SITE_OTHER): Payer: Self-pay | Admitting: Vascular Surgery

## 2018-09-19 ENCOUNTER — Other Ambulatory Visit: Payer: Self-pay

## 2018-09-19 VITALS — BP 118/77 | HR 56 | Resp 12 | Ht 62.0 in | Wt 159.0 lb

## 2018-09-19 DIAGNOSIS — I6523 Occlusion and stenosis of bilateral carotid arteries: Secondary | ICD-10-CM

## 2018-09-19 DIAGNOSIS — I771 Stricture of artery: Secondary | ICD-10-CM

## 2018-09-19 DIAGNOSIS — I10 Essential (primary) hypertension: Secondary | ICD-10-CM

## 2018-09-19 DIAGNOSIS — Z720 Tobacco use: Secondary | ICD-10-CM

## 2018-09-19 DIAGNOSIS — F17210 Nicotine dependence, cigarettes, uncomplicated: Secondary | ICD-10-CM

## 2018-09-19 NOTE — Progress Notes (Signed)
MRN : [id number]  Sheila [name] is a 58 y.o. ([birth year]) female who presents with chief complaint of  Chief Complaint  Patient presents with  . Follow-up  .  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  She underwent left subclavian artery angioplasty last year and her arm has felt much better.  She does describe some tingling and numbness in her fingers on the left hand but she says that happens in the right hand as well.  She continues to smoke and again is advised to stop smoking.  She reports no ulcerations or infection.  Her noninvasive studies show triphasic waveforms in the left subclavian, axillary, and brachial arteries.  She does have monophasic waveforms down in the radial and ulnar arteries.  Current Outpatient Medications  Medication Sig Dispense Refill  . amLODipine (NORVASC) 10 MG tablet Take 1 tablet (10 mg total) by mouth daily. 90 tablet 3  . aspirin EC 81 MG tablet Take by mouth.    Marland Kitchen atorvastatin (LIPITOR) 10 MG tablet Take 1 tablet (10 mg total) by mouth daily. 30 tablet 11  . carvedilol (COREG) 25 MG tablet Take 0.5 tablets (12.5 mg total) by mouth 2 (two) times daily with a meal. 60 tablet 5  . cloNIDine (CATAPRES) 0.3 MG tablet Take 1 tablet (0.3 mg total) by mouth 3 (three) times daily. 60 tablet 3  . clopidogrel (PLAVIX) 75 MG tablet Take 1 tablet (75 mg total) by mouth daily. 30 tablet 11  . hydrALAZINE (APRESOLINE) 25 MG tablet Take 1 tablet (25 mg total) by mouth 2 (two) times daily. 60 tablet 2  . lisinopril (PRINIVIL,ZESTRIL) 40 MG tablet Take 1 tablet (40 mg total) by mouth daily. 90 tablet 4  . pantoprazole (PROTONIX) 40 MG tablet Take 1 tablet (40 mg total) by mouth daily. 30 tablet 3  . gabapentin (NEURONTIN) 300 MG capsule TAKE ONE CAPSULE BY MOUTH AT BEDTIME (Patient not taking: Reported on 09/19/2018) 30 capsule 0   No current facility-administered medications for this visit.     Past Medical  History:  Diagnosis Date  . Aorto-iliac atherosclerosis (HCC) 01/06/2016  . Asthma   . COPD (chronic obstructive pulmonary disease) (HCC)   . Hypertension   . Tobacco abuse 01/06/2016  . Vitamin D deficiency 01/06/2016    Past Surgical History:  Procedure Laterality Date  . ANKLE FRACTURE SURGERY Left 11/07/2009  . left ankle hardware removal    . left ankle surgery    . LOWER EXTREMITY ANGIOGRAPHY Left 10/17/2017   Procedure: LOWER EXTREMITY ANGIOGRAPHY;  Surgeon: Annice Needy, MD;  Location: [facility];  Service: Cardiovascular;  Laterality: Left;    Social History        Tobacco Use  . Smoking status: Current Every Day Smoker    Packs/day: 1.00    Years: 30.00    Pack years: 30.00    Types: Cigarettes  . Smokeless tobacco: Never Used  Substance Use Topics  . Alcohol use: No    Alcohol/week: 0.0 oz  . Drug use: No     Family History      Family History  Problem Relation Age of Onset  . COPD Mother   . Heart failure Mother   . COPD Father   . Heart failure Father          Allergies  Allergen Reactions  . Vicodin [Hydrocodone-Acetaminophen] Nausea And Vomiting  . Hct [Hydrochlorothiazide] Palpitations  . Tramadol Palpitations  REVIEW OF SYSTEMS(Negative unless checked)  Constitutional: [] ?Weight loss[] ?Fever[] ?Chills Cardiac:[] ?Chest pain[] ?Chest pressure[] ?Palpitations [] ?Shortness of breath when laying flat [] ?Shortness of breath at rest [] ?Shortness of breath with exertion. Vascular: [] ?Pain in legs with walking[] ?Pain in legsat rest[] ?Pain in legs when laying flat [] ?Claudication [] ?Pain in feet when walking [] ?Pain in feet at rest [] ?Pain in feet when laying flat [] ?History of DVT [] ?Phlebitis [] ?Swelling in legs [] ?Varicose veins [] ?Non-healing ulcers Pulmonary: [] ?Uses home oxygen [] ?Productive cough[] ?Hemoptysis [] ?Wheeze [] ?COPD  [] ?Asthma Neurologic: [x] ?Dizziness [] ?Blackouts [] ?Seizures [] ?History of stroke [] ?History of TIA[] ?Aphasia [] ?Temporary blindness[] ?Dysphagia [x] ?Weaknessor numbness in arms [] ?Weakness or numbnessin legs Musculoskeletal: [x] ?Arthritis [] ?Joint swelling [x] ?Joint pain [] ?Low back pain Hematologic:[] ?Easy bruising[] ?Easy bleeding [] ?Hypercoagulable state [] ?Anemic  Gastrointestinal:[] ?Blood in stool[] ?Vomiting blood[] ?Gastroesophageal reflux/heartburn[] ?Abdominal pain Genitourinary: [] ?Chronic kidney disease [] ?Difficulturination [] ?Frequenturination [] ?Burning with urination[] ?Hematuria Skin: [] ?Rashes [] ?Ulcers [] ?Wounds Psychological: [] ?History of anxiety[] ?History of major depression.   Physical Examination  Vitals:   09/19/18 1431  BP: 118/77  Pulse: (!) 56  Resp: 12  Weight: 159 lb (72.1 kg)  Height: 5\' 2"  (1.575 m)   Body mass index is 29.08 kg/m. Gen:  WD/WN, NAD. Appears much older than stated age. Head: [place]/AT, No temporalis wasting. Ear/Nose/Throat: Hearing grossly intact, nares w/o erythema or drainage, trachea midline Eyes: Conjunctiva clear. Sclera non-icteric Neck: Supple.  Left carotid bruit  Pulmonary:  Good air movement, equal and clear to auscultation bilaterally.  Cardiac: RRR, No JVD Vascular:  Vessel Right Left  Radial 1+ Palpable 1+ Palpable                                    Musculoskeletal: M/S 5/5 throughout.  No deformity or atrophy. No edema. Neurologic: CN 2-12 intact. Sensation grossly intact in extremities.  Symmetrical.  Speech is fluent. Motor exam as listed above. Psychiatric: Judgment intact, Mood & affect appropriate for pt's clinical situation. Dermatologic: No rashes or ulcers noted.  No cellulitis or open wounds.      CBC Lab Results  Component Value Date   WBC 10.2 08/17/2018   HGB 13.2 08/17/2018   HCT 38.8 08/17/2018   MCV 94 08/17/2018   PLT 315  08/17/2018    BMET    Component Value Date/Time   NA 144 08/17/2018 1101   NA 143 12/04/2013 1330   K 4.1 08/17/2018 1101   K 3.5 12/04/2013 1330   CL 111 (H) 08/17/2018 1101   CL 111 (H) 12/04/2013 1330   CO2 21 08/17/2018 1101   CO2 27 12/04/2013 1330   GLUCOSE 89 08/17/2018 1101   GLUCOSE 113 (H) 02/27/2017 0305   GLUCOSE 90 12/04/2013 1330   BUN 8 08/17/2018 1101   BUN 8 12/04/2013 1330   CREATININE 0.70 08/17/2018 1101   CREATININE 0.71 12/04/2013 1330   CALCIUM 10.9 (H) 08/17/2018 1101   CALCIUM 10.3 (H) 12/03/2015 1854   GFRNONAA 96 08/17/2018 1101   GFRNONAA >60 12/04/2013 1330   GFRAA 110 08/17/2018 1101   GFRAA >60 12/04/2013 1330   CrCl cannot be calculated (Patient's most recent lab result is older than the maximum 21 days allowed.).  COAG Lab Results  Component Value Date   INR 0.87 05/08/2010    Radiology No results found.   Assessment/Plan Essential hypertension blood pressure control important in reducing the progression of atherosclerotic disease. On appropriate oral medications.   Carotid stenosis Mild at last check. 1-39% bilaterally. Continue aspirin. Recheck at next visit   Tobacco abuse We had a discussion for approximately  3 minutes regarding the absolute need for smoking cessation due to the deleterious nature of tobacco on the vascular system. We discussed the tobacco use would diminish patency of any intervention, and likely significantly worsen progressio of disease. We discussed multiple agents for quitting including replacement therapy or medications to reduce cravings such as Chantix. The patient voices their understanding of the importance of smoking cessation.  Subclavian artery stenosis (HCC) Her noninvasive studies show triphasic waveforms in the left subclavian, axillary, and brachial arteries.  She does have monophasic waveforms down in the radial and ulnar arteries.  No disabling symptoms at this time.  I will keep on her  to stop smoking.  I have also recommended continuing a 1682-month interval follow-up with the monophasic flow distally.  She will contact our office with any changes in the interim.    Festus BarrenJason Dew, MD  09/19/2018 3:17 PM    This note was created with Dragon medical transcription system.  Any errors from dictation are purely unintentional

## 2018-09-19 NOTE — Assessment & Plan Note (Signed)
Her noninvasive studies show triphasic waveforms in the left subclavian, axillary, and brachial arteries.  She does have monophasic waveforms down in the radial and ulnar arteries.  No disabling symptoms at this time.  I will keep on her to stop smoking.  I have also recommended continuing a 54-month interval follow-up with the monophasic flow distally.  She will contact our office with any changes in the interim.

## 2018-09-21 ENCOUNTER — Encounter: Payer: Self-pay | Admitting: Gerontology

## 2018-09-21 ENCOUNTER — Other Ambulatory Visit: Payer: Self-pay

## 2018-09-21 ENCOUNTER — Other Ambulatory Visit (INDEPENDENT_AMBULATORY_CARE_PROVIDER_SITE_OTHER): Payer: Self-pay | Admitting: Vascular Surgery

## 2018-09-21 ENCOUNTER — Ambulatory Visit: Payer: Self-pay | Admitting: Gerontology

## 2018-09-21 VITALS — BP 108/64 | HR 49 | Ht 62.0 in | Wt 158.0 lb

## 2018-09-21 DIAGNOSIS — I10 Essential (primary) hypertension: Secondary | ICD-10-CM

## 2018-09-21 DIAGNOSIS — Z09 Encounter for follow-up examination after completed treatment for conditions other than malignant neoplasm: Secondary | ICD-10-CM

## 2018-09-21 DIAGNOSIS — R001 Bradycardia, unspecified: Secondary | ICD-10-CM

## 2018-09-21 DIAGNOSIS — I771 Stricture of artery: Secondary | ICD-10-CM

## 2018-09-21 DIAGNOSIS — Z0000 Encounter for general adult medical examination without abnormal findings: Secondary | ICD-10-CM

## 2018-09-21 DIAGNOSIS — R319 Hematuria, unspecified: Secondary | ICD-10-CM

## 2018-09-21 NOTE — Patient Instructions (Addendum)
STOP TAKING CARVEDILOL 25 MG   DASH Eating Plan DASH stands for "Dietary Approaches to Stop Hypertension." The DASH eating plan is a healthy eating plan that has been shown to reduce high blood pressure (hypertension). It may also reduce your risk for type 2 diabetes, heart disease, and stroke. The DASH eating plan may also help with weight loss. What are tips for following this plan?  General guidelines  Avoid eating more than 2,300 mg (milligrams) of salt (sodium) a day. If you have hypertension, you may need to reduce your sodium intake to 1,500 mg a day.  Limit alcohol intake to no more than 1 drink a day for nonpregnant women and 2 drinks a day for men. One drink equals 12 oz of beer, 5 oz of wine, or 1 oz of hard liquor.  Work with your health care provider to maintain a healthy body weight or to lose weight. Ask what an ideal weight is for you.  Get at least 30 minutes of exercise that causes your heart to beat faster (aerobic exercise) most days of the week. Activities may include walking, swimming, or biking.  Work with your health care provider or diet and nutrition specialist (dietitian) to adjust your eating plan to your individual calorie needs. Reading food labels   Check food labels for the amount of sodium per serving. Choose foods with less than 5 percent of the Daily Value of sodium. Generally, foods with less than 300 mg of sodium per serving fit into this eating plan.  To find whole grains, look for the word "whole" as the first word in the ingredient list. Shopping  Buy products labeled as "low-sodium" or "no salt added."  Buy fresh foods. Avoid canned foods and premade or frozen meals. Cooking  Avoid adding salt when cooking. Use salt-free seasonings or herbs instead of table salt or sea salt. Check with your health care provider or pharmacist before using salt substitutes.  Do not fry foods. Cook foods using healthy methods such as baking, boiling, grilling,  and broiling instead.  Cook with heart-healthy oils, such as olive, canola, soybean, or sunflower oil. Meal planning  Eat a balanced diet that includes: ? 5 or more servings of fruits and vegetables each day. At each meal, try to fill half of your plate with fruits and vegetables. ? Up to 6-8 servings of whole grains each day. ? Less than 6 oz of lean meat, poultry, or fish each day. A 3-oz serving of meat is about the same size as a deck of cards. One egg equals 1 oz. ? 2 servings of low-fat dairy each day. ? A serving of nuts, seeds, or beans 5 times each week. ? Heart-healthy fats. Healthy fats called Omega-3 fatty acids are found in foods such as flaxseeds and coldwater fish, like sardines, salmon, and mackerel.  Limit how much you eat of the following: ? Canned or prepackaged foods. ? Food that is high in trans fat, such as fried foods. ? Food that is high in saturated fat, such as fatty meat. ? Sweets, desserts, sugary drinks, and other foods with added sugar. ? Full-fat dairy products.  Do not salt foods before eating.  Try to eat at least 2 vegetarian meals each week.  Eat more home-cooked food and less restaurant, buffet, and fast food.  When eating at a restaurant, ask that your food be prepared with less salt or no salt, if possible. What foods are recommended? The items listed may not be a  complete list. Talk with your dietitian about what dietary choices are best for you. Grains Whole-grain or whole-wheat bread. Whole-grain or whole-wheat pasta. Brown rice. [place]. Bulgur. Whole-grain and low-sodium cereals. Pita bread. Low-fat, low-sodium crackers. Whole-wheat flour tortillas. Vegetables Fresh or frozen vegetables (raw, steamed, roasted, or grilled). Low-sodium or reduced-sodium tomato and vegetable juice. Low-sodium or reduced-sodium tomato sauce and tomato paste. Low-sodium or reduced-sodium canned vegetables. Fruits All fresh, dried, or frozen fruit. Canned  fruit in natural juice (without added sugar). Meat and other protein foods Skinless chicken or [place]. Ground chicken or [place]. Pork with fat trimmed off. Fish and seafood. Egg whites. Dried beans, peas, or lentils. Unsalted nuts, nut butters, and seeds. Unsalted canned beans. Lean cuts of beef with fat trimmed off. Low-sodium, lean deli meat. Dairy Low-fat (1%) or fat-free (skim) milk. Fat-free, low-fat, or reduced-fat cheeses. Nonfat, low-sodium ricotta or cottage cheese. Low-fat or nonfat yogurt. Low-fat, low-sodium cheese. Fats and oils Soft margarine without trans fats. Vegetable oil. Low-fat, reduced-fat, or light mayonnaise and salad dressings (reduced-sodium). Canola, safflower, olive, soybean, and sunflower oils. Avocado. Seasoning and other foods Herbs. Spices. Seasoning mixes without salt. Unsalted popcorn and pretzels. Fat-free sweets. What foods are not recommended? The items listed may not be a complete list. Talk with your dietitian about what dietary choices are best for you. Grains Baked goods made with fat, such as croissants, muffins, or some breads. Dry pasta or rice meal packs. Vegetables Creamed or fried vegetables. Vegetables in a cheese sauce. Regular canned vegetables (not low-sodium or reduced-sodium). Regular canned tomato sauce and paste (not low-sodium or reduced-sodium). Regular tomato and vegetable juice (not low-sodium or reduced-sodium). Angie Fava. Olives. Fruits Canned fruit in a light or heavy syrup. Fried fruit. Fruit in cream or butter sauce. Meat and other protein foods Fatty cuts of meat. Ribs. Fried meat. Berniece Salines. Sausage. Bologna and other processed lunch meats. Salami. Fatback. Hotdogs. Bratwurst. Salted nuts and seeds. Canned beans with added salt. Canned or smoked fish. Whole eggs or egg yolks. Chicken or [place] with skin. Dairy Whole or 2% milk, cream, and half-and-half. Whole or full-fat cream cheese. Whole-fat or sweetened yogurt. Full-fat cheese.  Nondairy creamers. Whipped toppings. Processed cheese and cheese spreads. Fats and oils Butter. Stick margarine. Lard. Shortening. Ghee. Bacon fat. Tropical oils, such as coconut, palm kernel, or palm oil. Seasoning and other foods Salted popcorn and pretzels. Onion salt, garlic salt, seasoned salt, table salt, and sea salt. Worcestershire sauce. Tartar sauce. Barbecue sauce. Teriyaki sauce. Soy sauce, including reduced-sodium. Steak sauce. Canned and packaged gravies. Fish sauce. Oyster sauce. Cocktail sauce. Horseradish that you find on the shelf. Ketchup. Mustard. Meat flavorings and tenderizers. Bouillon cubes. Hot sauce and Tabasco sauce. Premade or packaged marinades. Premade or packaged taco seasonings. Relishes. Regular salad dressings. Where to find more information:  National Heart, Lung, and [place]: https://wilson-eaton.com/  American Heart Association: www.heart.org Summary  The DASH eating plan is a healthy eating plan that has been shown to reduce high blood pressure (hypertension). It may also reduce your risk for type 2 diabetes, heart disease, and stroke.  With the DASH eating plan, you should limit salt (sodium) intake to 2,300 mg a day. If you have hypertension, you may need to reduce your sodium intake to 1,500 mg a day.  When on the DASH eating plan, aim to eat more fresh fruits and vegetables, whole grains, lean proteins, low-fat dairy, and heart-healthy fats.  Work with your health care provider or diet and nutrition specialist (dietitian)  to adjust your eating plan to your individual calorie needs. This information is not intended to replace advice given to you by your health care provider. Make sure you discuss any questions you have with your health care provider. Document Released: 07/15/2011 Document Revised: 07/19/2016 Document Reviewed: 07/19/2016 Elsevier Interactive Patient Education  2019 [employer].

## 2018-09-21 NOTE — Progress Notes (Signed)
Established Patient Office Visit  Subjective:  Patient ID: Sheila [name], female    DOB: 1961/02/24  Age: 58 y.o. MRN: [id number]  CC:  Chief Complaint  Patient presents with  . Follow-up    high blood pressure    HPI Sheila [name] presents for follow up on hypertension, she reports that she continues to take Amlodipine 10 mg , Carvedilol 12.5 mg bid, clonidine 0.3 mg tid, hydralazine 25 mg bid and lisinopril 40 mg daily. she denies light headedness, chest pain, palpitation, peripheral edema and she states that she checks her blood pressure at home and sometimes SBP is in the low to mid 150's and DPB in the low 70's. She reports that her heart rate has being in the low to mid 50's, she denies dizziness and 25 mg Carvedilol will be discontinued. She states that she smokes 1/2 pack of cigarette a day and continues to work on quitting. Otherwise she denies fever, chills and has no further complaint.  Past Medical History:  Diagnosis Date  . Aorto-iliac atherosclerosis (HCC) 01/06/2016  . Asthma   . COPD (chronic obstructive pulmonary disease) (HCC)   . Hypertension   . Tobacco abuse 01/06/2016  . Vitamin D deficiency 01/06/2016    Past Surgical History:  Procedure Laterality Date  . ANKLE FRACTURE SURGERY Left 11/07/2009  . left ankle hardware removal    . left ankle surgery    . LOWER EXTREMITY ANGIOGRAPHY Left 10/17/2017   Procedure: LOWER EXTREMITY ANGIOGRAPHY;  Surgeon: Annice Needy, MD;  Location: [facility];  Service: Cardiovascular;  Laterality: Left;    Family History  Problem Relation Age of Onset  . COPD Mother   . Heart failure Mother   . COPD Father   . Heart failure Father     Social History   Socioeconomic History  . Marital status: Divorced    Spouse name: Not on file  . Number of children: Not on file  . Years of education: Not on file  . Highest education level: GED or equivalent  Occupational History  . Occupation: unemployed  Social Needs   . Financial resource strain: Somewhat hard  . Food insecurity:    Worry: Never true    Inability: Never true  . Transportation needs:    Medical: No    Non-medical: No  Tobacco Use  . Smoking status: Current Every Day Smoker    Packs/day: 1.00    Years: 30.00    Pack years: 30.00    Types: Cigarettes  . Smokeless tobacco: Never Used  Substance and Sexual Activity  . Alcohol use: No    Alcohol/week: 0.0 standard drinks  . Drug use: No  . Sexual activity: Never  Lifestyle  . Physical activity:    Days per week: 6 days    Minutes per session: 40 min  . Stress: To some extent  Relationships  . Social connections:    Talks on phone: More than three times a week    Gets together: Three times a week    Attends religious service: 1 to 4 times per year    Active member of club or organization: No    Attends meetings of clubs or organizations: Never    Relationship status: Living with partner  . Intimate partner violence:    Fear of current or ex partner: No    Emotionally abused: No    Physically abused: No    Forced sexual activity: No  Other Topics Concern  .  Not on file  Social History Narrative   Food stamps for both her and friend she has been with for 23 years. Some months run low, but always make it.     Outpatient Medications Prior to Visit  Medication Sig Dispense Refill  . amLODipine (NORVASC) 10 MG tablet Take 1 tablet (10 mg total) by mouth daily. 90 tablet 3  . aspirin EC 81 MG tablet Take by mouth.    Marland Kitchen. atorvastatin (LIPITOR) 10 MG tablet Take 1 tablet (10 mg total) by mouth daily. 30 tablet 11  . cloNIDine (CATAPRES) 0.3 MG tablet Take 1 tablet (0.3 mg total) by mouth 3 (three) times daily. 60 tablet 3  . clopidogrel (PLAVIX) 75 MG tablet Take 1 tablet (75 mg total) by mouth daily. 30 tablet 11  . gabapentin (NEURONTIN) 300 MG capsule TAKE ONE CAPSULE BY MOUTH AT BEDTIME 30 capsule 0  . hydrALAZINE (APRESOLINE) 25 MG tablet Take 1 tablet (25 mg total) by  mouth 2 (two) times daily. 60 tablet 2  . lisinopril (PRINIVIL,ZESTRIL) 40 MG tablet Take 1 tablet (40 mg total) by mouth daily. 90 tablet 4  . pantoprazole (PROTONIX) 40 MG tablet Take 1 tablet (40 mg total) by mouth daily. 30 tablet 3  . carvedilol (COREG) 25 MG tablet Take 0.5 tablets (12.5 mg total) by mouth 2 (two) times daily with a meal. 60 tablet 5   No facility-administered medications prior to visit.     Allergies  Allergen Reactions  . Vicodin [Hydrocodone-Acetaminophen] Nausea And Vomiting  . Vicodin [Hydrocodone-Acetaminophen]     Abd pain, N/V  . Hct [Hydrochlorothiazide] Palpitations  . Tramadol Palpitations    ROS Review of Systems  Constitutional: Negative.   HENT: Negative.   Eyes: Negative.   Respiratory: Negative.   Cardiovascular: Negative.   Gastrointestinal: Negative.   Genitourinary: Negative.   Musculoskeletal: Myalgias: chronic muscle aches and pains.  Skin: Negative.   Neurological: Negative.   Hematological: Bruises/bleeds easily.  Psychiatric/Behavioral: Negative.       Objective:    Physical Exam  Constitutional: She is oriented to person, place, and time. She appears well-developed and well-nourished.  HENT:  Head: Normocephalic and atraumatic.  Eyes: Pupils are equal, round, and reactive to light. EOM are normal.  Neck: Normal range of motion.  Cardiovascular: Regular rhythm. Bradycardia present.  Pulmonary/Chest: Effort normal and breath sounds normal.  Abdominal: Soft. Bowel sounds are normal.  Musculoskeletal: Normal range of motion.  Neurological: She is alert and oriented to person, place, and time.  Skin: Skin is warm.  Psychiatric: She has a normal mood and affect. Her behavior is normal. Judgment and thought content normal.    BP 108/64 (BP Location: Right Arm, Patient Position: Sitting, Cuff Size: Large)   Pulse (!) 49   Ht 5\' 2"  (1.575 m)   Wt 158 lb (71.7 kg)   LMP 08/09/2008 (Within Years)   SpO2 99%   BMI 28.90  kg/m  Wt Readings from Last 3 Encounters:  09/21/18 158 lb (71.7 kg)  09/19/18 159 lb (72.1 kg)  08/17/18 162 lb 14.4 oz (73.9 kg)   She reports making dietary changes, exercise 30 minutes daily,and continues to decrease her caloric intake.  Health Maintenance Due  Topic Date Due  . Hepatitis C Screening  10-02-1960  . TETANUS/TDAP  04/10/1979  . PAP SMEAR-Modifier  04/09/1981  . MAMMOGRAM  04/09/2010  . COLONOSCOPY  04/09/2010  . INFLUENZA VACCINE  03/09/2018   She declines influenza vaccine, and was provided  information to schedule Mammogram and pap smear. She was provided with stool card for occult blood screening.  There are no preventive care reminders to display for this patient.  Lab Results  Component Value Date   TSH 2.070 08/17/2018   Lab Results  Component Value Date   WBC 10.2 08/17/2018   HGB 13.2 08/17/2018   HCT 38.8 08/17/2018   MCV 94 08/17/2018   PLT 315 08/17/2018   Lab Results  Component Value Date   NA 144 08/17/2018   K 4.1 08/17/2018   CO2 21 08/17/2018   GLUCOSE 89 08/17/2018   BUN 8 08/17/2018   CREATININE 0.70 08/17/2018   BILITOT <0.2 08/17/2018   ALKPHOS 86 08/17/2018   AST 13 08/17/2018   ALT 14 08/17/2018   PROT 5.8 (L) 08/17/2018   ALBUMIN 3.8 08/17/2018   CALCIUM 10.9 (H) 08/17/2018   ANIONGAP 4 (L) 02/27/2017   She was advised to increase dietary protein intake. Lab Results  Component Value Date   CHOL 119 08/17/2018   Lab Results  Component Value Date   HDL 41 08/17/2018   Lab Results  Component Value Date   LDLCALC 56 08/17/2018   Lab Results  Component Value Date   TRIG 109 08/17/2018   Lab Results  Component Value Date   CHOLHDL 2.9 08/17/2018   Lab Results  Component Value Date   HGBA1C 5.6 08/17/2018      Assessment & Plan:   Problem List Items Addressed This Visit      Cardiovascular and Mediastinum   Essential hypertension - Primary     Other   Health care maintenance    Other Visit  Diagnoses    Bradycardia       Follow up       Hematuria, unspecified type       Relevant Orders   Urinalysis      No orders of the defined types were placed in this encounter. 1. Essential hypertension - Ms. Gravely's blood pressure has greatly improved, she's complying with her treatment regimen. Her goal BP is < 140/90. Blood pressure was rechecked during visit and it was 108/ 64. Her Carvedilol 25 mg will be discontinued due to bradycardia, and she will continue to take her other blood pressure medication. She was encouraged to continue on low salt diet and weight loss regimen.  2. Bradycardia - Carvedilol 25 mg will be discontinued and she was encouraged to monitor and document blood pressure and pulse, and bring log to next appointment.  3. Health care maintenance - She was provided with stool card for occult blood screening and was encouraged to schedule pap smear, mammogram and Hep C screening.   4. Hematuria, unspecified type  -  Urine was collected for Urinalysis to re evaluate hematuria.   Follow-up: Return in about 2 weeks (around 10/05/2018), or if symptoms worsen or fail to improve.    Lonzie Simmer Trellis Paganini, NP

## 2018-09-22 LAB — URINALYSIS
Bilirubin, UA: NEGATIVE
Glucose, UA: NEGATIVE
Ketones, UA: NEGATIVE
Leukocytes, UA: NEGATIVE
Nitrite, UA: POSITIVE — AB
Protein, UA: NEGATIVE
RBC, UA: NEGATIVE
Specific Gravity, UA: 1.009 (ref 1.005–1.030)
Urobilinogen, Ur: 0.2 mg/dL (ref 0.2–1.0)
pH, UA: 6 (ref 5.0–7.5)

## 2018-09-22 LAB — SPECIMEN STATUS REPORT

## 2018-09-26 ENCOUNTER — Other Ambulatory Visit: Payer: Self-pay | Admitting: Gerontology

## 2018-09-26 DIAGNOSIS — N390 Urinary tract infection, site not specified: Secondary | ICD-10-CM

## 2018-09-26 MED ORDER — PHENAZOPYRIDINE HCL 200 MG PO TABS: 200.0000 mg | ORAL_TABLET | Freq: Every day | ORAL | 0 refills | Status: DC

## 2018-09-26 MED ORDER — NITROFURANTOIN MONOHYD MACRO 100 MG PO CAPS: 100.0000 mg | ORAL_CAPSULE | Freq: Two times a day (BID) | ORAL | 0 refills | Status: DC

## 2018-10-05 ENCOUNTER — Encounter: Payer: Self-pay | Admitting: Gerontology

## 2018-10-05 ENCOUNTER — Ambulatory Visit: Payer: Self-pay | Admitting: Gerontology

## 2018-10-05 ENCOUNTER — Other Ambulatory Visit: Payer: Self-pay

## 2018-10-05 VITALS — BP 175/101 | HR 97 | Ht 62.0 in | Wt 156.3 lb

## 2018-10-05 DIAGNOSIS — I10 Essential (primary) hypertension: Secondary | ICD-10-CM

## 2018-10-05 DIAGNOSIS — J0100 Acute maxillary sinusitis, unspecified: Secondary | ICD-10-CM

## 2018-10-05 DIAGNOSIS — Z720 Tobacco use: Secondary | ICD-10-CM

## 2018-10-05 DIAGNOSIS — N390 Urinary tract infection, site not specified: Secondary | ICD-10-CM

## 2018-10-05 MED ORDER — SALINE SPRAY 0.65 % NA SOLN: 1.0000 | NASAL | 0 refills | Status: DC | PRN

## 2018-10-05 MED ORDER — FLUTICASONE PROPIONATE 50 MCG/ACT NA SUSP: 2.0000 | Freq: Every day | NASAL | 0 refills | Status: DC

## 2018-10-05 NOTE — Progress Notes (Signed)
Established Patient Office Visit  Subjective:  Patient ID: Sheila [name], female    DOB: 03-30-1961  Age: 58 y.o. MRN: [id number]  CC:  Chief Complaint  Patient presents with  . Follow-up    HPI Sheila [name] presents for follow up on HTN, she states that she takes Amlodipine 10 mg daily, clonidine 0.3 mg tid, hydralazine 25 mg bid and lisinopril 40 mg daily. She reports checking her blood pressure at home and it was 142/90. She denies peripheral edema. She continues to smoke 1/2 pack of cigarette daily and states that she's cutting back.  She reports having sinus pressure and frontal headache, She states that she is having 8/10 non radiating headache. She states that taking 650 mg tylenol relieves pain, and the sinus pressure has being going on  for 2 weeks. She c/o having rhinorrhea, sore throat and cough. She reports coughing up small amount of yellowish phlegm. She denies fever, chills, chest pain, palpitation and shortness of breath.  Dysuria: She states that she finished the course of antibiotics for UTI, but continues to have dysuria, urinary frequency, urgency and bilateral flank pain. She denies hematuria, fever and chills. Urine will be collected for UA/Culture.  Past Medical History:  Diagnosis Date  . Aorto-iliac atherosclerosis (HCC) 01/06/2016  . Asthma   . COPD (chronic obstructive pulmonary disease) (HCC)   . Hypertension   . Tobacco abuse 01/06/2016  . Vitamin D deficiency 01/06/2016    Past Surgical History:  Procedure Laterality Date  . ANKLE FRACTURE SURGERY Left 11/07/2009  . left ankle hardware removal    . left ankle surgery    . LOWER EXTREMITY ANGIOGRAPHY Left 10/17/2017   Procedure: LOWER EXTREMITY ANGIOGRAPHY;  Surgeon: Sheila Needy, MD;  Location: [facility];  Service: Cardiovascular;  Laterality: Left;    Family History  Problem Relation Age of Onset  . COPD Mother   . Heart failure Mother   . COPD Father   . Heart failure Father      Social History   Socioeconomic History  . Marital status: Divorced    Spouse name: Not on file  . Number of children: Not on file  . Years of education: Not on file  . Highest education level: GED or equivalent  Occupational History  . Occupation: unemployed  Social Needs  . Financial resource strain: Somewhat hard  . Food insecurity:    Worry: Never true    Inability: Never true  . Transportation needs:    Medical: No    Non-medical: No  Tobacco Use  . Smoking status: Current Every Day Smoker    Packs/day: 1.00    Years: 30.00    Pack years: 30.00    Types: Cigarettes  . Smokeless tobacco: Never Used  Substance and Sexual Activity  . Alcohol use: No    Alcohol/week: 0.0 standard drinks  . Drug use: No  . Sexual activity: Never  Lifestyle  . Physical activity:    Days per week: 6 days    Minutes per session: 40 min  . Stress: To some extent  Relationships  . Social connections:    Talks on phone: More than three times a week    Gets together: Three times a week    Attends religious service: 1 to 4 times per year    Active member of club or organization: No    Attends meetings of clubs or organizations: Never    Relationship status: Living with partner  .  Intimate partner violence:    Fear of current or ex partner: No    Emotionally abused: No    Physically abused: No    Forced sexual activity: No  Other Topics Concern  . Not on file  Social History Narrative   Food stamps for both her and friend she has been with for 23 years. Some months run low, but always make it.     Outpatient Medications Prior to Visit  Medication Sig Dispense Refill  . amLODipine (NORVASC) 10 MG tablet Take 1 tablet (10 mg total) by mouth daily. 90 tablet 3  . aspirin EC 81 MG tablet Take by mouth.    Marland Kitchen. atorvastatin (LIPITOR) 10 MG tablet Take 1 tablet (10 mg total) by mouth daily. 30 tablet 11  . cloNIDine (CATAPRES) 0.3 MG tablet Take 1 tablet (0.3 mg total) by mouth 3 (three)  times daily. 60 tablet 3  . clopidogrel (PLAVIX) 75 MG tablet TAKE ONE TABLET BY MOUTH EVERY DAY 90 tablet 0  . gabapentin (NEURONTIN) 300 MG capsule TAKE ONE CAPSULE BY MOUTH AT BEDTIME 30 capsule 0  . hydrALAZINE (APRESOLINE) 25 MG tablet Take 1 tablet (25 mg total) by mouth 2 (two) times daily. 60 tablet 2  . lisinopril (PRINIVIL,ZESTRIL) 40 MG tablet Take 1 tablet (40 mg total) by mouth daily. 90 tablet 4  . pantoprazole (PROTONIX) 40 MG tablet Take 1 tablet (40 mg total) by mouth daily. 30 tablet 3  . phenazopyridine (PYRIDIUM) 200 MG tablet Take 1 tablet (200 mg total) by mouth daily. 2 tablet 0  . nitrofurantoin, macrocrystal-monohydrate, (MACROBID) 100 MG capsule Take 1 capsule (100 mg total) by mouth 2 (two) times daily. 10 capsule 0   No facility-administered medications prior to visit.     Allergies  Allergen Reactions  . Vicodin [Hydrocodone-Acetaminophen] Nausea And Vomiting  . Vicodin [Hydrocodone-Acetaminophen]     Abd pain, N/V  . Hct [Hydrochlorothiazide] Palpitations  . Tramadol Palpitations    ROS Review of Systems  Constitutional: Negative.   HENT: Negative.   Respiratory: Negative.   Cardiovascular: Negative.   Gastrointestinal: Negative.   Genitourinary: Positive for dysuria, flank pain, frequency and urgency.  Skin: Negative.   Neurological: Negative.   Hematological: Bruises/bleeds easily.  Psychiatric/Behavioral: Negative.       Objective:    Physical Exam  Constitutional: She is oriented to person, place, and time. She appears well-developed and well-nourished.  HENT:  Head: Normocephalic and atraumatic.  Right Ear: Hearing, tympanic membrane, external ear and ear canal normal.  Left Ear: Hearing, tympanic membrane, external ear and ear canal normal.  Nose: Right sinus exhibits maxillary sinus tenderness and frontal sinus tenderness. Left sinus exhibits maxillary sinus tenderness and frontal sinus tenderness.  Mouth/Throat: Uvula is midline,  oropharynx is clear and moist and mucous membranes are normal.  Eyes: Pupils are equal, round, and reactive to light.  Cardiovascular: Normal rate and regular rhythm.  Pulmonary/Chest: Effort normal and breath sounds normal.  Abdominal: Soft. Bowel sounds are normal. There is CVA tenderness.  Genitourinary:    Genitourinary Comments: Deferred per patient   Musculoskeletal: Normal range of motion.  Neurological: She is alert and oriented to person, place, and time.  Skin: Skin is warm.  Psychiatric: She has a normal mood and affect. Her behavior is normal. Judgment and thought content normal.    BP (!) 175/101 (BP Location: Right Arm, Patient Position: Sitting, Cuff Size: Large)   Pulse 97   Ht 5\' 2"  (1.575 m)  Wt 156 lb 4.8 oz (70.9 kg)   LMP 08/09/2008 (Within Years)   SpO2 95%   BMI 28.59 kg/m  Wt Readings from Last 3 Encounters:  10/05/18 156 lb 4.8 oz (70.9 kg)  09/21/18 158 lb (71.7 kg)  09/19/18 159 lb (72.1 kg)    Health Maintenance Due  Topic Date Due  . Hepatitis C Screening  [birth year]  . TETANUS/TDAP  04/10/1979  . PAP SMEAR-Modifier  04/09/1981  . MAMMOGRAM  04/09/2010  . COLONOSCOPY  04/09/2010  . INFLUENZA VACCINE  03/09/2018    Will follow up on her health maintenance care at follow up appointment.  Lab Results  Component Value Date   TSH 2.070 08/17/2018   Lab Results  Component Value Date   WBC 10.2 08/17/2018   HGB 13.2 08/17/2018   HCT 38.8 08/17/2018   MCV 94 08/17/2018   PLT 315 08/17/2018   Lab Results  Component Value Date   NA 144 08/17/2018   K 4.1 08/17/2018   CO2 21 08/17/2018   GLUCOSE 89 08/17/2018   BUN 8 08/17/2018   CREATININE 0.70 08/17/2018   BILITOT <0.2 08/17/2018   ALKPHOS 86 08/17/2018   AST 13 08/17/2018   ALT 14 08/17/2018   PROT 5.8 (L) 08/17/2018   ALBUMIN 3.8 08/17/2018   CALCIUM 10.9 (H) 08/17/2018   ANIONGAP 4 (L) 02/27/2017   Lab Results  Component Value Date   CHOL 119 08/17/2018   Lab Results   Component Value Date   HDL 41 08/17/2018   Lab Results  Component Value Date   LDLCALC 56 08/17/2018   Lab Results  Component Value Date   TRIG 109 08/17/2018   Lab Results  Component Value Date   CHOLHDL 2.9 08/17/2018   Lab Results  Component Value Date   HGBA1C 5.6 08/17/2018      Assessment & Plan:   Problem List Items Addressed This Visit      Other   Tobacco abuse    Other Visit Diagnoses    HTN (hypertension), malignant    -  Primary   Urinary tract infection without hematuria, site unspecified       Relevant Orders   CBC w/Diff   Urinalysis   Urine Culture   Acute non-recurrent maxillary sinusitis       Relevant Medications   fluticasone (FLONASE) 50 MCG/ACT nasal spray   sodium chloride (OCEAN) 0.65 % SOLN nasal spray    1. HTN (hypertension), malignant - Uncontrolled blood pressure, she states that she takes medication as ordered, but never brings blood pressure log to appointment. - She was advised to continue on current treatment regimen, check and document blood pressure and bring log to clinic next week. - She was encouraged to continue on low salt diet.  2. Urinary tract infection without hematuria, site unspecified - Routine labs was collected to evaluate for UTI and pyelonephritis. - CBC w/Diff; Future - Urinalysis; Future - Urine Culture; Future  3. Acute non-recurrent maxillary sinusitis - She will start using Flonase and sodium chloride nasal spray for sinusitis - Cbc was collected to rule out bacterial infection -She will continue 650 mg tylenol for headache every 6-8 hours as needed and go to the ED for worsening headache and other neurological changes.  4. Tobacco abuse - She was strongly encouraged on smoking cessation.   Meds ordered this encounter  Medications  . fluticasone (FLONASE) 50 MCG/ACT nasal spray    Sig: Place 2 sprays into both nostrils daily.  Dispense:  16 g    Refill:  0  . sodium chloride (OCEAN) 0.65 %  SOLN nasal spray    Sig: Place 1 spray into both nostrils as needed for congestion.    Dispense:  1 Bottle    Refill:  0    Follow-up: Return in about 6 days (around 10/11/2018), or if symptoms worsen or fail to improve.    Stavroula Rohde Trellis Paganini, NP

## 2018-10-05 NOTE — Patient Instructions (Signed)
DASH Eating Plan  DASH stands for "Dietary Approaches to Stop Hypertension." The DASH eating plan is a healthy eating plan that has been shown to reduce high blood pressure (hypertension). It may also reduce your risk for type 2 diabetes, heart disease, and stroke. The DASH eating plan may also help with weight loss.  What are tips for following this plan?    General guidelines   Avoid eating more than 2,300 mg (milligrams) of salt (sodium) a day. If you have hypertension, you may need to reduce your sodium intake to 1,500 mg a day.   Limit alcohol intake to no more than 1 drink a day for nonpregnant women and 2 drinks a day for men. One drink equals 12 oz of beer, 5 oz of wine, or 1 oz of hard liquor.   Work with your health care provider to maintain a healthy body weight or to lose weight. Ask what an ideal weight is for you.   Get at least 30 minutes of exercise that causes your heart to beat faster (aerobic exercise) most days of the week. Activities may include walking, swimming, or biking.   Work with your health care provider or diet and nutrition specialist (dietitian) to adjust your eating plan to your individual calorie needs.  Reading food labels     Check food labels for the amount of sodium per serving. Choose foods with less than 5 percent of the Daily Value of sodium. Generally, foods with less than 300 mg of sodium per serving fit into this eating plan.   To find whole grains, look for the word "whole" as the first word in the ingredient list.  Shopping   Buy products labeled as "low-sodium" or "no salt added."   Buy fresh foods. Avoid canned foods and premade or frozen meals.  Cooking   Avoid adding salt when cooking. Use salt-free seasonings or herbs instead of table salt or sea salt. Check with your health care provider or pharmacist before using salt substitutes.   Do not fry foods. Cook foods using healthy methods such as baking, boiling, grilling, and broiling instead.   Cook with  heart-healthy oils, such as olive, canola, soybean, or sunflower oil.  Meal planning   Eat a balanced diet that includes:  ? 5 or more servings of fruits and vegetables each day. At each meal, try to fill half of your plate with fruits and vegetables.  ? Up to 6-8 servings of whole grains each day.  ? Less than 6 oz of lean meat, poultry, or fish each day. A 3-oz serving of meat is about the same size as a deck of cards. One egg equals 1 oz.  ? 2 servings of low-fat dairy each day.  ? A serving of nuts, seeds, or beans 5 times each week.  ? Heart-healthy fats. Healthy fats called Omega-3 fatty acids are found in foods such as flaxseeds and coldwater fish, like sardines, salmon, and mackerel.   Limit how much you eat of the following:  ? Canned or prepackaged foods.  ? Food that is high in trans fat, such as fried foods.  ? Food that is high in saturated fat, such as fatty meat.  ? Sweets, desserts, sugary drinks, and other foods with added sugar.  ? Full-fat dairy products.   Do not salt foods before eating.   Try to eat at least 2 vegetarian meals each week.   Eat more home-cooked food and less restaurant, buffet, and fast food.     When eating at a restaurant, ask that your food be prepared with less salt or no salt, if possible.  What foods are recommended?  The items listed may not be a complete list. Talk with your dietitian about what dietary choices are best for you.  Grains  Whole-grain or whole-wheat bread. Whole-grain or whole-wheat pasta. Brown rice. Oatmeal. Quinoa. Bulgur. Whole-grain and low-sodium cereals. Pita bread. Low-fat, low-sodium crackers. Whole-wheat flour tortillas.  Vegetables  Fresh or frozen vegetables (raw, steamed, roasted, or grilled). Low-sodium or reduced-sodium tomato and vegetable juice. Low-sodium or reduced-sodium tomato sauce and tomato paste. Low-sodium or reduced-sodium canned vegetables.  Fruits  All fresh, dried, or frozen fruit. Canned fruit in natural juice (without  added sugar).  Meat and other protein foods  Skinless chicken or turkey. Ground chicken or turkey. Pork with fat trimmed off. Fish and seafood. Egg whites. Dried beans, peas, or lentils. Unsalted nuts, nut butters, and seeds. Unsalted canned beans. Lean cuts of beef with fat trimmed off. Low-sodium, lean deli meat.  Dairy  Low-fat (1%) or fat-free (skim) milk. Fat-free, low-fat, or reduced-fat cheeses. Nonfat, low-sodium ricotta or cottage cheese. Low-fat or nonfat yogurt. Low-fat, low-sodium cheese.  Fats and oils  Soft margarine without trans fats. Vegetable oil. Low-fat, reduced-fat, or light mayonnaise and salad dressings (reduced-sodium). Canola, safflower, olive, soybean, and sunflower oils. Avocado.  Seasoning and other foods  Herbs. Spices. Seasoning mixes without salt. Unsalted popcorn and pretzels. Fat-free sweets.  What foods are not recommended?  The items listed may not be a complete list. Talk with your dietitian about what dietary choices are best for you.  Grains  Baked goods made with fat, such as croissants, muffins, or some breads. Dry pasta or rice meal packs.  Vegetables  Creamed or fried vegetables. Vegetables in a cheese sauce. Regular canned vegetables (not low-sodium or reduced-sodium). Regular canned tomato sauce and paste (not low-sodium or reduced-sodium). Regular tomato and vegetable juice (not low-sodium or reduced-sodium). Pickles. Olives.  Fruits  Canned fruit in a light or heavy syrup. Fried fruit. Fruit in cream or butter sauce.  Meat and other protein foods  Fatty cuts of meat. Ribs. Fried meat. Bacon. Sausage. Bologna and other processed lunch meats. Salami. Fatback. Hotdogs. Bratwurst. Salted nuts and seeds. Canned beans with added salt. Canned or smoked fish. Whole eggs or egg yolks. Chicken or turkey with skin.  Dairy  Whole or 2% milk, cream, and half-and-half. Whole or full-fat cream cheese. Whole-fat or sweetened yogurt. Full-fat cheese. Nondairy creamers. Whipped toppings.  Processed cheese and cheese spreads.  Fats and oils  Butter. Stick margarine. Lard. Shortening. Ghee. Bacon fat. Tropical oils, such as coconut, palm kernel, or palm oil.  Seasoning and other foods  Salted popcorn and pretzels. Onion salt, garlic salt, seasoned salt, table salt, and sea salt. Worcestershire sauce. Tartar sauce. Barbecue sauce. Teriyaki sauce. Soy sauce, including reduced-sodium. Steak sauce. Canned and packaged gravies. Fish sauce. Oyster sauce. Cocktail sauce. Horseradish that you find on the shelf. Ketchup. Mustard. Meat flavorings and tenderizers. Bouillon cubes. Hot sauce and Tabasco sauce. Premade or packaged marinades. Premade or packaged taco seasonings. Relishes. Regular salad dressings.  Where to find more information:   National Heart, Lung, and Blood Institute: www.nhlbi.nih.gov   American Heart Association: www.heart.org  Summary   The DASH eating plan is a healthy eating plan that has been shown to reduce high blood pressure (hypertension). It may also reduce your risk for type 2 diabetes, heart disease, and stroke.   With the   DASH eating plan, you should limit salt (sodium) intake to 2,300 mg a day. If you have hypertension, you may need to reduce your sodium intake to 1,500 mg a day.   When on the DASH eating plan, aim to eat more fresh fruits and vegetables, whole grains, lean proteins, low-fat dairy, and heart-healthy fats.   Work with your health care provider or diet and nutrition specialist (dietitian) to adjust your eating plan to your individual calorie needs.  This information is not intended to replace advice given to you by your health care provider. Make sure you discuss any questions you have with your health care provider.  Document Released: 07/15/2011 Document Revised: 07/19/2016 Document Reviewed: 07/19/2016  Elsevier Interactive Patient Education  2019 [employer]

## 2018-10-08 ENCOUNTER — Emergency Department
Admission: EM | Admit: 2018-10-08 | Discharge: 2018-10-09 | Disposition: A | Discharge status: Home / Self Care | Payer: Self-pay | Attending: Emergency Medicine | Admitting: Emergency Medicine

## 2018-10-08 ENCOUNTER — Emergency Department: Payer: Self-pay

## 2018-10-08 ENCOUNTER — Other Ambulatory Visit: Payer: Self-pay

## 2018-10-08 DIAGNOSIS — F17210 Nicotine dependence, cigarettes, uncomplicated: Secondary | ICD-10-CM | POA: Insufficient documentation

## 2018-10-08 DIAGNOSIS — Y939 Activity, unspecified: Secondary | ICD-10-CM | POA: Insufficient documentation

## 2018-10-08 DIAGNOSIS — I10 Essential (primary) hypertension: Secondary | ICD-10-CM | POA: Insufficient documentation

## 2018-10-08 DIAGNOSIS — M79605 Pain in left leg: Secondary | ICD-10-CM | POA: Insufficient documentation

## 2018-10-08 DIAGNOSIS — Y999 Unspecified external cause status: Secondary | ICD-10-CM | POA: Insufficient documentation

## 2018-10-08 DIAGNOSIS — X58XXXA Exposure to other specified factors, initial encounter: Secondary | ICD-10-CM | POA: Insufficient documentation

## 2018-10-08 DIAGNOSIS — S32010A Wedge compression fracture of first lumbar vertebra, initial encounter for closed fracture: Secondary | ICD-10-CM | POA: Insufficient documentation

## 2018-10-08 DIAGNOSIS — N3001 Acute cystitis with hematuria: Secondary | ICD-10-CM | POA: Insufficient documentation

## 2018-10-08 DIAGNOSIS — M79604 Pain in right leg: Secondary | ICD-10-CM | POA: Insufficient documentation

## 2018-10-08 DIAGNOSIS — R109 Unspecified abdominal pain: Secondary | ICD-10-CM | POA: Insufficient documentation

## 2018-10-08 DIAGNOSIS — Y929 Unspecified place or not applicable: Secondary | ICD-10-CM | POA: Insufficient documentation

## 2018-10-08 DIAGNOSIS — J449 Chronic obstructive pulmonary disease, unspecified: Secondary | ICD-10-CM | POA: Insufficient documentation

## 2018-10-08 LAB — CBC
HEMATOCRIT: 44.5 % (ref 36.0–46.0)
HEMOGLOBIN: 14 g/dL (ref 12.0–15.0)
MCH: 31 pg (ref 26.0–34.0)
MCHC: 31.5 g/dL (ref 30.0–36.0)
MCV: 98.7 fL (ref 80.0–100.0)
Platelets: 391 10*3/uL (ref 150–400)
RBC: 4.51 MIL/uL (ref 3.87–5.11)
RDW: 13.7 % (ref 11.5–15.5)
WBC: 9.5 10*3/uL (ref 4.0–10.5)
nRBC: 0 % (ref 0.0–0.2)

## 2018-10-08 LAB — BASIC METABOLIC PANEL
Anion gap: 8 (ref 5–15)
BUN: 10 mg/dL (ref 6–20)
CHLORIDE: 110 mmol/L (ref 98–111)
CO2: 25 mmol/L (ref 22–32)
Calcium: 11.2 mg/dL — ABNORMAL HIGH (ref 8.9–10.3)
Creatinine, Ser: 0.67 mg/dL (ref 0.44–1.00)
GFR calc Af Amer: >60 mL/min (ref >60)
GFR calc non Af Amer: >60 mL/min (ref >60)
Glucose, Bld: 104 mg/dL — ABNORMAL HIGH (ref 70–99)
Potassium: 3.5 mmol/L (ref 3.5–5.1)
Sodium: 143 mmol/L (ref 135–145)

## 2018-10-08 LAB — URINALYSIS, COMPLETE (UACMP) WITH MICROSCOPIC
Bilirubin Urine: NEGATIVE
GLUCOSE, UA: NEGATIVE mg/dL
Ketones, ur: NEGATIVE mg/dL
Leukocytes,Ua: NEGATIVE
Nitrite: NEGATIVE
Protein, ur: NEGATIVE mg/dL
Specific Gravity, Urine: 1.01 (ref 1.005–1.030)
pH: 6 (ref 5.0–8.0)

## 2018-10-08 MED ORDER — CIPROFLOXACIN HCL 500 MG PO TABS
500.0000 mg | ORAL_TABLET | Freq: Once | ORAL | Status: AC
  Administered 2018-10-08: 500 mg via ORAL
  Filled 2018-10-08: qty 1

## 2018-10-08 MED ORDER — OXYCODONE-ACETAMINOPHEN 5-325 MG PO TABS: 1.0000 | ORAL_TABLET | Freq: Three times a day (TID) | ORAL | 0 refills | Status: DC | PRN

## 2018-10-08 MED ORDER — ONDANSETRON 4 MG PO TBDP: 4.0000 mg | ORAL_TABLET | Freq: Three times a day (TID) | ORAL | 0 refills | Status: DC | PRN

## 2018-10-08 MED ORDER — CIPROFLOXACIN HCL 500 MG PO TABS: 500.0000 mg | ORAL_TABLET | Freq: Two times a day (BID) | ORAL | 0 refills | Status: AC

## 2018-10-08 MED ORDER — CIPROFLOXACIN HCL 500 MG PO TABS: 500.0000 mg | ORAL_TABLET | Freq: Two times a day (BID) | ORAL | 0 refills | Status: DC

## 2018-10-08 NOTE — ED Provider Notes (Signed)
[facility] Emergency Department Provider Note       Time seen: ----------------------------------------- 9:49 PM on 10/08/2018 -----------------------------------------   I have reviewed the triage vital signs and the nursing notes.  HISTORY   Chief Complaint Flank Pain    HPI Sheila [name] is a 58 y.o. female with a history of asthma, COPD, hypertension, atherosclerosis who presents to the ED for leg pain that started a few weeks ago.  Patient reports it is bilateral.  She has been admitted in the past for UTI and for kidney infection.  She has had some nausea, denies any vomiting or diarrhea.  She does have pain with urinating.  Past Medical History:  Diagnosis Date  . Aorto-iliac atherosclerosis (HCC) 01/06/2016  . Asthma   . COPD (chronic obstructive pulmonary disease) (HCC)   . Hypertension   . Tobacco abuse 01/06/2016  . Vitamin D deficiency 01/06/2016    Patient Active Problem List   Diagnosis Date Noted  . Health care maintenance 08/10/2018  . Allergic rhinitis 04/13/2018  . Compression fracture of body of thoracic vertebra (HCC) 04/13/2018  . Hyperlipidemia 03/15/2018  . 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 PAD (peripheral artery disease) (HCC) 11/22/2017  . Malignant hypertension 10/20/2017  . Carotid stenosis 10/14/2017  . Subclavian artery stenosis (HCC) 08/11/2017  . Dizziness 03/08/2017  . Left sided numbness 03/08/2017  . Solitary pulmonary nodule 03/08/2017  . GERD (gastroesophageal reflux disease) 03/08/2017  . Abnormal EKG 02/26/2017  . Hypokalemia 02/26/2017  . Urge incontinence of urine 05/04/2016  . Recurrent UTI 03/02/2016  . COPD (chronic obstructive pulmonary disease) (HCC) 01/06/2016  . Adrenal nodule (HCC) 01/06/2016  . Tobacco abuse 01/06/2016  . Aorto-iliac atherosclerosis (HCC) 01/06/2016  . Vitamin D deficiency 01/06/2016  . Lower abdominal pain 01/06/2016  . Myopia of both  eyes 01/06/2016  . Hypercalcemia 01/06/2016  . Essential hypertension 12/11/2015  . Infection and inflammatory reaction due to other internal orthopedic device, implant, and graft 02/11/2012    Past Surgical History:  Procedure Laterality Date  . ANKLE FRACTURE SURGERY Left 11/07/2009  . left ankle hardware removal    . left ankle surgery    . LOWER EXTREMITY ANGIOGRAPHY Left 10/17/2017   Procedure: LOWER EXTREMITY ANGIOGRAPHY;  Surgeon: Annice Needy, MD;  Location: [facility] INVASIVE CV LAB;  Service: Cardiovascular;  Laterality: Left;    Allergies Vicodin [hydrocodone-acetaminophen]; Vicodin [hydrocodone-acetaminophen]; Hct [hydrochlorothiazide]; and Tramadol  Social History Social History   Tobacco Use  . Smoking status: Current Every Day Smoker    Packs/day: 1.00    Years: 30.00    Pack years: 30.00    Types: Cigarettes  . Smokeless tobacco: Never Used  Substance Use Topics  . Alcohol use: No    Alcohol/week: 0.0 standard drinks  . Drug use: No   Review of Systems Constitutional: Negative for fever. Cardiovascular: Negative for chest pain. Respiratory: Negative for shortness of breath. Gastrointestinal: Negative for abdominal pain, vomiting and diarrhea. Genitourinary: Positive for dysuria, hematuria Musculoskeletal: Positive for back pain Skin: Negative for rash. Neurological: Negative for headaches, focal weakness or numbness.  All systems negative/normal/unremarkable except as stated in the HPI  ____________________________________________   PHYSICAL EXAM:  VITAL SIGNS: ED Triage Vitals  Enc Vitals Group     BP 10/08/18 1646 (!) 201/98     Pulse Rate 10/08/18 1646 93     Resp 10/08/18 1646 18     Temp 10/08/18 1646 97.7 F (36.5 C)  Temp Source 10/08/18 1646 Oral     SpO2 10/08/18 1646 95 %     Weight 10/08/18 1646 158 lb (71.7 kg)     Height 10/08/18 1646 5\' 2"  (1.575 m)     Head Circumference --      Peak Flow --      Pain Score 10/08/18 1703 5      Pain Loc --      Pain Edu? --      Excl. in GC? --    Constitutional: Alert and oriented. Well appearing and in no distress. Eyes: Conjunctivae are normal. Normal extraocular movements. ENT      Head: Normocephalic and atraumatic.      Nose: No congestion/rhinnorhea.      Mouth/Throat: Mucous membranes are moist.      Neck: No stridor. Cardiovascular: Normal rate, regular rhythm. No murmurs, rubs, or gallops. Respiratory: Normal respiratory effort without tachypnea nor retractions. Breath sounds are clear and equal bilaterally. No wheezes/rales/rhonchi. Gastrointestinal: Soft and nontender. Normal bowel sounds.  Bilateral CVA tenderness is noted Musculoskeletal: Nontender with normal range of motion in extremities. No lower extremity tenderness nor edema. Neurologic:  Normal speech and language. No gross focal neurologic deficits are appreciated.  Skin:  Skin is warm, dry and intact. No rash noted. Psychiatric: Mood and affect are normal. Speech and behavior are normal.  ____________________________________________  ED COURSE:  As part of my medical decision making, I reviewed the following data within the electronic MEDICAL RECORD NUMBER History obtained from family if available, nursing notes, old chart and ekg, as well as notes from prior ED visits. Patient presented for back pain, we will assess with labs and imaging as indicated at this time.   Procedures ____________________________________________   LABS (pertinent positives/negatives)  Labs Reviewed  URINALYSIS, COMPLETE (UACMP) WITH MICROSCOPIC - Abnormal; Notable for the following components:      Result Value   Color, Urine STRAW (*)    APPearance CLEAR (*)    Hgb urine dipstick MODERATE (*)    Bacteria, UA RARE (*)    All other components within normal limits  BASIC METABOLIC PANEL - Abnormal; Notable for the following components:   Glucose, Bld 104 (*)    Calcium 11.2 (*)    All other components within normal limits   CBC    RADIOLOGY Images were viewed by me  CT renal protocol IMPRESSION: 1. No renal stones or obstructive uropathy. 2. Mild L1 superior endplate compression fracture, age indeterminate but new from chest CT 01/30/2018. 3. Mild colonic diverticulosis without diverticulitis. 4. Aortic Atherosclerosis (ICD10-I70.0). ____________________________________________   DIFFERENTIAL DIAGNOSIS   Renal colic, UTI, pyelonephritis, spinal stenosis, muscle strain  FINAL ASSESSMENT AND PLAN  Flank pain, UTI second to Proteus, L1 compression fracture   Plan: The patient had presented for bilateral flank pain. Patient's labs did reveal some hematuria but were otherwise unremarkable. Patient's imaging did reveal a slight L1 compression fracture.  Outpatient urinalysis and urine culture from 3 days ago revealed Proteus.  She will be started on Cipro and given pain medicine for her back.  She is cleared for outpatient follow-up.   Ulice Dash, MD    Note: This note was generated in part or whole with voice recognition software. Voice recognition is usually quite accurate but there are transcription errors that can and very often do occur. I apologize for any typographical errors that were not detected and corrected.     Emily Filbert, MD 10/08/18 2258

## 2018-10-08 NOTE — ED Triage Notes (Signed)
Pt comes via POV from home with c/o flank pain that started a few weeks ago. Pt states bilateral flank pain.  Pt states some nausea.  Pt denies any V/D. Pt states pain when urinating. Pt states some odor and the urge to pee and not able to produce.

## 2018-10-09 NOTE — ED Notes (Signed)
Pt already assessed by EDP and DC'd, critical pts to this pt sides prevent timely care  Pt reports 1 week of UTI ABX ineffective  Pt alone and driving home

## 2018-10-09 NOTE — ED Notes (Signed)
No peripheral IV placed this visit.   Discharge instructions reviewed with patient. Questions fielded by this RN. Patient verbalizes understanding of instructions. Patient discharged home in stable condition per williams. No acute distress noted at time of discharge.   

## 2018-10-10 LAB — CBC WITH DIFFERENTIAL/PLATELET
Basophils Absolute: 0.1 10*3/uL (ref 0.0–0.2)
Basos: 1 %
EOS (ABSOLUTE): 0.1 10*3/uL (ref 0.0–0.4)
Eos: 1 %
Hematocrit: 40 % (ref 34.0–46.6)
Hemoglobin: 13.7 g/dL (ref 11.1–15.9)
Immature Grans (Abs): 0 10*3/uL (ref 0.0–0.1)
Immature Granulocytes: 0 %
Lymphocytes Absolute: 2.6 10*3/uL (ref 0.7–3.1)
Lymphs: 23 %
MCH: 31.5 pg (ref 26.6–33.0)
MCHC: 34.3 g/dL (ref 31.5–35.7)
MCV: 92 fL (ref 79–97)
Monocytes Absolute: 1.2 10*3/uL — ABNORMAL HIGH (ref 0.1–0.9)
Monocytes: 11 %
Neutrophils Absolute: 7.2 10*3/uL — ABNORMAL HIGH (ref 1.4–7.0)
Neutrophils: 64 %
Platelets: 369 10*3/uL (ref 150–450)
RBC: 4.35 x10E6/uL (ref 3.77–5.28)
RDW: 12 % (ref 11.7–15.4)
WBC: 11.3 10*3/uL — ABNORMAL HIGH (ref 3.4–10.8)

## 2018-10-10 LAB — URINALYSIS
Bilirubin, UA: NEGATIVE
Glucose, UA: NEGATIVE
KETONES UA: NEGATIVE
Leukocytes, UA: NEGATIVE
Nitrite, UA: NEGATIVE
SPEC GRAV UA: 1.007 (ref 1.005–1.030)
Urobilinogen, Ur: 0.2 mg/dL (ref 0.2–1.0)
pH, UA: 7 (ref 5.0–7.5)

## 2018-10-10 LAB — URINE CULTURE

## 2018-10-11 ENCOUNTER — Other Ambulatory Visit: Payer: Self-pay

## 2018-10-11 ENCOUNTER — Ambulatory Visit: Payer: Self-pay | Admitting: Gerontology

## 2018-10-11 ENCOUNTER — Encounter: Payer: Self-pay | Admitting: Gerontology

## 2018-10-11 VITALS — BP 151/92 | HR 55 | Ht 62.0 in | Wt 158.5 lb

## 2018-10-11 DIAGNOSIS — B964 Proteus (mirabilis) (morganii) as the cause of diseases classified elsewhere: Secondary | ICD-10-CM

## 2018-10-11 DIAGNOSIS — S32010A Wedge compression fracture of first lumbar vertebra, initial encounter for closed fracture: Secondary | ICD-10-CM

## 2018-10-11 DIAGNOSIS — I10 Essential (primary) hypertension: Secondary | ICD-10-CM

## 2018-10-11 DIAGNOSIS — N390 Urinary tract infection, site not specified: Secondary | ICD-10-CM

## 2018-10-11 NOTE — Patient Instructions (Signed)
Back Exercises If you have pain in your back, do these exercises 2-3 times each day or as told by your doctor. When the pain goes away, do the exercises once each day, but repeat the steps more times for each exercise (do more repetitions). If you do not have pain in your back, do these exercises once each day or as told by your doctor. Exercises Single Knee to Chest Do these steps 3-5 times in a row for each leg: 1. Lie on your back on a firm bed or the floor with your legs stretched out. 2. Bring one knee to your chest. 3. Hold your knee to your chest by grabbing your knee or thigh. 4. Pull on your knee until you feel a gentle stretch in your lower back. 5. Keep doing the stretch for 10-30 seconds. 6. Slowly let go of your leg and straighten it. Pelvic Tilt Do these steps 5-10 times in a row: 1. Lie on your back on a firm bed or the floor with your legs stretched out. 2. Bend your knees so they point up to the ceiling. Your feet should be flat on the floor. 3. Tighten your lower belly (abdomen) muscles to press your lower back against the floor. This will make your tailbone point up to the ceiling instead of pointing down to your feet or the floor. 4. Stay in this position for 5-10 seconds while you gently tighten your muscles and breathe evenly. Cat-Cow Do these steps until your lower back bends more easily: 1. Get on your hands and knees on a firm surface. Keep your hands under your shoulders, and keep your knees under your hips. You may put padding under your knees. 2. Let your head hang down, and make your tailbone point down to the floor so your lower back is round like the back of a cat. 3. Stay in this position for 5 seconds. 4. Slowly lift your head and make your tailbone point up to the ceiling so your back hangs low (sags) like the back of a cow. 5. Stay in this position for 5 seconds.  Press-Ups Do these steps 5-10 times in a row: 1. Lie on your belly (face-down) on the  floor. 2. Place your hands near your head, about shoulder-width apart. 3. While you keep your back relaxed and keep your hips on the floor, slowly straighten your arms to raise the top half of your body and lift your shoulders. Do not use your back muscles. To make yourself more comfortable, you may change where you place your hands. 4. Stay in this position for 5 seconds. 5. Slowly return to lying flat on the floor.  Bridges Do these steps 10 times in a row: 1. Lie on your back on a firm surface. 2. Bend your knees so they point up to the ceiling. Your feet should be flat on the floor. 3. Tighten your butt muscles and lift your butt off of the floor until your waist is almost as high as your knees. If you do not feel the muscles working in your butt and the back of your thighs, slide your feet 1-2 inches farther away from your butt. 4. Stay in this position for 3-5 seconds. 5. Slowly lower your butt to the floor, and let your butt muscles relax. If this exercise is too easy, try doing it with your arms crossed over your chest. Belly Crunches Do these steps 5-10 times in a row: 1. Lie on your back on a   firm bed or the floor with your legs stretched out. 2. Bend your knees so they point up to the ceiling. Your feet should be flat on the floor. 3. Cross your arms over your chest. 4. Tip your chin a little bit toward your chest but do not bend your neck. 5. Tighten your belly muscles and slowly raise your chest just enough to lift your shoulder blades a tiny bit off of the floor. 6. Slowly lower your chest and your head to the floor. Back Lifts Do these steps 5-10 times in a row: 1. Lie on your belly (face-down) with your arms at your sides, and rest your forehead on the floor. 2. Tighten the muscles in your legs and your butt. 3. Slowly lift your chest off of the floor while you keep your hips on the floor. Keep the back of your head in line with the curve in your back. Look at the floor  while you do this. 4. Stay in this position for 3-5 seconds. 5. Slowly lower your chest and your face to the floor. Contact a doctor if:  Your back pain gets a lot worse when you do an exercise.  Your back pain does not lessen 2 hours after you exercise. If you have any of these problems, stop doing the exercises. Do not do them again unless your doctor says it is okay. Get help right away if:  You have sudden, very bad back pain. If this happens, stop doing the exercises. Do not do them again unless your doctor says it is okay. This information is not intended to replace advice given to you by your health care provider. Make sure you discuss any questions you have with your health care provider. Document Released: 08/28/2010 Document Revised: 04/19/2018 Document Reviewed: 09/19/2014 Elsevier Interactive Patient Education  2019 [employer] DASH Eating Plan DASH stands for "Dietary Approaches to Stop Hypertension." The DASH eating plan is a healthy eating plan that has been shown to reduce high blood pressure (hypertension). It may also reduce your risk for type 2 diabetes, heart disease, and stroke. The DASH eating plan may also help with weight loss. What are tips for following this plan?  General guidelines  Avoid eating more than 2,300 mg (milligrams) of salt (sodium) a day. If you have hypertension, you may need to reduce your sodium intake to 1,500 mg a day.  Limit alcohol intake to no more than 1 drink a day for nonpregnant women and 2 drinks a day for men. One drink equals 12 oz of beer, 5 oz of wine, or 1 oz of hard liquor.  Work with your health care provider to maintain a healthy body weight or to lose weight. Ask what an ideal weight is for you.  Get at least 30 minutes of exercise that causes your heart to beat faster (aerobic exercise) most days of the week. Activities may include walking, swimming, or biking.  Work with your health care provider or diet and nutrition  specialist (dietitian) to adjust your eating plan to your individual calorie needs. Reading food labels   Check food labels for the amount of sodium per serving. Choose foods with less than 5 percent of the Daily Value of sodium. Generally, foods with less than 300 mg of sodium per serving fit into this eating plan.  To find whole grains, look for the word "whole" as the first word in the ingredient list. Shopping  Buy products labeled as "low-sodium" or "no salt added."    Buy fresh foods. Avoid canned foods and premade or frozen meals. Cooking  Avoid adding salt when cooking. Use salt-free seasonings or herbs instead of table salt or sea salt. Check with your health care provider or pharmacist before using salt substitutes.  Do not fry foods. Cook foods using healthy methods such as baking, boiling, grilling, and broiling instead.  Cook with heart-healthy oils, such as olive, canola, soybean, or sunflower oil. Meal planning  Eat a balanced diet that includes: ? 5 or more servings of fruits and vegetables each day. At each meal, try to fill half of your plate with fruits and vegetables. ? Up to 6-8 servings of whole grains each day. ? Less than 6 oz of lean meat, poultry, or fish each day. A 3-oz serving of meat is about the same size as a deck of cards. One egg equals 1 oz. ? 2 servings of low-fat dairy each day. ? A serving of nuts, seeds, or beans 5 times each week. ? Heart-healthy fats. Healthy fats called Omega-3 fatty acids are found in foods such as flaxseeds and coldwater fish, like sardines, salmon, and mackerel.  Limit how much you eat of the following: ? Canned or prepackaged foods. ? Food that is high in trans fat, such as fried foods. ? Food that is high in saturated fat, such as fatty meat. ? Sweets, desserts, sugary drinks, and other foods with added sugar. ? Full-fat dairy products.  Do not salt foods before eating.  Try to eat at least 2 vegetarian meals each  week.  Eat more home-cooked food and less restaurant, buffet, and fast food.  When eating at a restaurant, ask that your food be prepared with less salt or no salt, if possible. What foods are recommended? The items listed may not be a complete list. Talk with your dietitian about what dietary choices are best for you. Grains Whole-grain or whole-wheat bread. Whole-grain or whole-wheat pasta. Brown rice. Oatmeal. Quinoa. Bulgur. Whole-grain and low-sodium cereals. Pita bread. Low-fat, low-sodium crackers. Whole-wheat flour tortillas. Vegetables Fresh or frozen vegetables (raw, steamed, roasted, or grilled). Low-sodium or reduced-sodium tomato and vegetable juice. Low-sodium or reduced-sodium tomato sauce and tomato paste. Low-sodium or reduced-sodium canned vegetables. Fruits All fresh, dried, or frozen fruit. Canned fruit in natural juice (without added sugar). Meat and other protein foods Skinless chicken or turkey. Ground chicken or turkey. Pork with fat trimmed off. Fish and seafood. Egg whites. Dried beans, peas, or lentils. Unsalted nuts, nut butters, and seeds. Unsalted canned beans. Lean cuts of beef with fat trimmed off. Low-sodium, lean deli meat. Dairy Low-fat (1%) or fat-free (skim) milk. Fat-free, low-fat, or reduced-fat cheeses. Nonfat, low-sodium ricotta or cottage cheese. Low-fat or nonfat yogurt. Low-fat, low-sodium cheese. Fats and oils Soft margarine without trans fats. Vegetable oil. Low-fat, reduced-fat, or light mayonnaise and salad dressings (reduced-sodium). Canola, safflower, olive, soybean, and sunflower oils. Avocado. Seasoning and other foods Herbs. Spices. Seasoning mixes without salt. Unsalted popcorn and pretzels. Fat-free sweets. What foods are not recommended? The items listed may not be a complete list. Talk with your dietitian about what dietary choices are best for you. Grains Baked goods made with fat, such as croissants, muffins, or some breads. Dry pasta  or rice meal packs. Vegetables Creamed or fried vegetables. Vegetables in a cheese sauce. Regular canned vegetables (not low-sodium or reduced-sodium). Regular canned tomato sauce and paste (not low-sodium or reduced-sodium). Regular tomato and vegetable juice (not low-sodium or reduced-sodium). Pickles. Olives. Fruits Canned fruit in a   light or heavy syrup. Fried fruit. Fruit in cream or butter sauce. Meat and other protein foods Fatty cuts of meat. Ribs. Fried meat. Bacon. Sausage. Bologna and other processed lunch meats. Salami. Fatback. Hotdogs. Bratwurst. Salted nuts and seeds. Canned beans with added salt. Canned or smoked fish. Whole eggs or egg yolks. Chicken or turkey with skin. Dairy Whole or 2% milk, cream, and half-and-half. Whole or full-fat cream cheese. Whole-fat or sweetened yogurt. Full-fat cheese. Nondairy creamers. Whipped toppings. Processed cheese and cheese spreads. Fats and oils Butter. Stick margarine. Lard. Shortening. Ghee. Bacon fat. Tropical oils, such as coconut, palm kernel, or palm oil. Seasoning and other foods Salted popcorn and pretzels. Onion salt, garlic salt, seasoned salt, table salt, and sea salt. Worcestershire sauce. Tartar sauce. Barbecue sauce. Teriyaki sauce. Soy sauce, including reduced-sodium. Steak sauce. Canned and packaged gravies. Fish sauce. Oyster sauce. Cocktail sauce. Horseradish that you find on the shelf. Ketchup. Mustard. Meat flavorings and tenderizers. Bouillon cubes. Hot sauce and Tabasco sauce. Premade or packaged marinades. Premade or packaged taco seasonings. Relishes. Regular salad dressings. Where to find more information:  National Heart, Lung, and Blood Institute: www.nhlbi.nih.gov  American Heart Association: www.heart.org Summary  The DASH eating plan is a healthy eating plan that has been shown to reduce high blood pressure (hypertension). It may also reduce your risk for type 2 diabetes, heart disease, and stroke.  With the  DASH eating plan, you should limit salt (sodium) intake to 2,300 mg a day. If you have hypertension, you may need to reduce your sodium intake to 1,500 mg a day.  When on the DASH eating plan, aim to eat more fresh fruits and vegetables, whole grains, lean proteins, low-fat dairy, and heart-healthy fats.  Work with your health care provider or diet and nutrition specialist (dietitian) to adjust your eating plan to your individual calorie needs. This information is not intended to replace advice given to you by your health care provider. Make sure you discuss any questions you have with your health care provider. Document Released: 07/15/2011 Document Revised: 07/19/2016 Document Reviewed: 07/19/2016 Elsevier Interactive Patient Education  2019 Elsevier Inc.  

## 2018-10-11 NOTE — Progress Notes (Signed)
Established Patient Office Visit  Subjective:  Patient ID: Sheila [name], female    DOB: August 15, 1960  Age: 58 y.o. MRN: [id number]  CC:  Chief Complaint  Patient presents with  . Follow-up    HPI Sheila [name] presents for follow up after ED visit on 10/08/18 for UTI, mild L1 compression fracture and Hypertension.  UTI: She states that she continues on her 10 day antibiotic course of Cipro 500 mg bid. She reports dysuria, but denies hematuria, fever or chills. Urine for UA/culture will be collected after antibiotic course.  Mild L1 compression fracture: She c/o intermittent radiating dull 6/10 pain to lower back. It radiates to left lower leg but denies bladder or bowel incontinence nor sciatica. During ED visit on 10/08/18, CT abdomen and pelvis showed Mild L1 superior endplate compression fracture. She reports that she takes Percocet as needed for excruciating pain. She will follow up with Dr. Justice Rocher.  Hypertension: She states that she takes 10 mg amlodipine daily, clonidine 0.3 mg tid, hydralazine 25 mg bid, lisinopril 40 mg daily. She states that she checks her blood pressure daily and it was 179/80 yesterday because she was in pain and she continues to adhere to low salt diet. She continues to smoke 1/2 pack of cigarette daily and continues to work on quitting. She denies chest pain, palpitation, shortness of breath, and otherwise she denies further concerns.  Past Medical History:  Diagnosis Date  . Aorto-iliac atherosclerosis (HCC) 01/06/2016  . Asthma   . COPD (chronic obstructive pulmonary disease) (HCC)   . Hypertension   . Tobacco abuse 01/06/2016  . Vitamin D deficiency 01/06/2016    Past Surgical History:  Procedure Laterality Date  . ANKLE FRACTURE SURGERY Left 11/07/2009  . left ankle hardware removal    . left ankle surgery    . LOWER EXTREMITY ANGIOGRAPHY Left 10/17/2017   Procedure: LOWER EXTREMITY ANGIOGRAPHY;  Surgeon: Annice Needy, MD;  Location: [facility] IN[facility]IVE [facility]  LAB;  Service: Cardiovascular;  Laterality: Left;    Family History  Problem Relation Age of Onset  . COPD Mother   . Heart failure Mother   . COPD Father   . Heart failure Father     Social History   Socioeconomic History  . Marital status: Divorced    Spouse name: Not on file  . Number of children: Not on file  . Years of education: Not on file  . Highest education level: GED or equivalent  Occupational History  . Occupation: unemployed  Social Needs  . Financial resource strain: Somewhat hard  . Food insecurity:    Worry: Never true    Inability: Never true  . Transportation needs:    Medical: No    Non-medical: No  Tobacco Use  . Smoking status: Current Every Day Smoker    Packs/day: 1.00    Years: 30.00    Pack years: 30.00    Types: Cigarettes  . Smokeless tobacco: Never Used  Substance and Sexual Activity  . Alcohol use: No    Alcohol/week: 0.0 standard drinks  . Drug use: No  . Sexual activity: Never  Lifestyle  . Physical activity:    Days per week: 6 days    Minutes per session: 40 min  . Stress: To some extent  Relationships  . Social connections:    Talks on phone: More than three times a week    Gets together: Three times a week    Attends religious service: 1 to 4  times per year    Active member of club or organization: No    Attends meetings of clubs or organizations: Never    Relationship status: Living with partner  . Intimate partner violence:    Fear of current or ex partner: No    Emotionally abused: No    Physically abused: No    Forced sexual activity: No  Other Topics Concern  . Not on file  Social History Narrative   Food stamps for both her and friend she has been with for 23 years. Some months run low, but always make it.     Outpatient Medications Prior to Visit  Medication Sig Dispense Refill  . amLODipine (NORVASC) 10 MG tablet Take 1 tablet (10 mg total) by mouth daily. 90 tablet 3  . aspirin EC 81 MG tablet Take by  mouth.    Marland Kitchen atorvastatin (LIPITOR) 10 MG tablet Take 1 tablet (10 mg total) by mouth daily. 30 tablet 11  . ciprofloxacin (CIPRO) 500 MG tablet Take 1 tablet (500 mg total) by mouth 2 (two) times daily for 10 days. 20 tablet 0  . cloNIDine (CATAPRES) 0.3 MG tablet Take 1 tablet (0.3 mg total) by mouth 3 (three) times daily. 60 tablet 3  . clopidogrel (PLAVIX) 75 MG tablet TAKE ONE TABLET BY MOUTH EVERY DAY 90 tablet 0  . fluticasone (FLONASE) 50 MCG/ACT nasal spray Place 2 sprays into both nostrils daily. 16 g 0  . gabapentin (NEURONTIN) 300 MG capsule TAKE ONE CAPSULE BY MOUTH AT BEDTIME 30 capsule 0  . hydrALAZINE (APRESOLINE) 25 MG tablet Take 1 tablet (25 mg total) by mouth 2 (two) times daily. 60 tablet 2  . lisinopril (PRINIVIL,ZESTRIL) 40 MG tablet Take 1 tablet (40 mg total) by mouth daily. 90 tablet 4  . ondansetron (ZOFRAN ODT) 4 MG disintegrating tablet Take 1 tablet (4 mg total) by mouth every 8 (eight) hours as needed for nausea or vomiting. 20 tablet 0  . oxyCODONE-acetaminophen (PERCOCET) 5-325 MG tablet Take 1 tablet by mouth every 8 (eight) hours as needed. 20 tablet 0  . pantoprazole (PROTONIX) 40 MG tablet Take 1 tablet (40 mg total) by mouth daily. 30 tablet 3  . phenazopyridine (PYRIDIUM) 200 MG tablet Take 1 tablet (200 mg total) by mouth daily. 2 tablet 0  . sodium chloride (OCEAN) 0.65 % SOLN nasal spray Place 1 spray into both nostrils as needed for congestion. 1 Bottle 0   No facility-administered medications prior to visit.     Allergies  Allergen Reactions  . Vicodin [Hydrocodone-Acetaminophen] Nausea And Vomiting  . Vicodin [Hydrocodone-Acetaminophen]     Abd pain, N/V  . Hct [Hydrochlorothiazide] Palpitations  . Tramadol Palpitations    ROS Review of Systems  Constitutional: Negative.   HENT: Negative.   Respiratory: Negative.   Cardiovascular: Negative.   Gastrointestinal: Negative.   Genitourinary: Positive for dysuria.  Musculoskeletal: Positive for  back pain (lower back pain).  Skin: Negative.   Neurological: Negative.   Psychiatric/Behavioral: Negative.       Objective:    Physical Exam  Constitutional: She is oriented to person, place, and time. She appears well-developed and well-nourished.  HENT:  Head: Normocephalic and atraumatic.  Eyes: Pupils are equal, round, and reactive to light. EOM are normal.  Neck: Normal range of motion. Neck supple.  Cardiovascular: Normal rate and regular rhythm.  Pulmonary/Chest: Effort normal and breath sounds normal.  Abdominal: Soft. Bowel sounds are normal. There is no CVA tenderness.  Genitourinary:  Genitourinary Comments: Deferred per patient   Musculoskeletal:     Lumbar back: She exhibits tenderness (tenderness with palpation and with flexion ).  Neurological: She is alert and oriented to person, place, and time.  Skin: Skin is warm.  Psychiatric: She has a normal mood and affect. Her behavior is normal. Judgment and thought content normal.    BP (!) 151/92   Pulse (!) 55   Ht 5\' 2"  (1.575 m)   Wt 158 lb 8 oz (71.9 kg)   LMP 08/09/2008 (Within Years)   SpO2 95%   BMI 28.99 kg/m  Wt Readings from Last 3 Encounters:  10/11/18 158 lb 8 oz (71.9 kg)  10/08/18 158 lb (71.7 kg)  10/05/18 156 lb 4.8 oz (70.9 kg)   She denies dizziness, and was advised to notify provider of low heart rate. She was advised to check and document blood pressure at home and bring log to next appointment, to continue on low salt diet, and continue to cut back on smoking.  Health Maintenance Due  Topic Date Due  . Hepatitis C Screening  Jun 08, 1961  . TETANUS/TDAP  04/10/1979  . PAP SMEAR-Modifier  04/09/1981  . MAMMOGRAM  04/09/2010  . COLONOSCOPY  04/09/2010  . INFLUENZA VACCINE  03/09/2018    She was provided with mammogram, Papsmear, Hep C screening information and also stool card for occult blood screening.  Lab Results  Component Value Date   TSH 2.070 08/17/2018   Lab Results   Component Value Date   WBC 9.5 10/08/2018   HGB 14.0 10/08/2018   HCT 44.5 10/08/2018   MCV 98.7 10/08/2018   PLT 391 10/08/2018   Lab Results  Component Value Date   NA 143 10/08/2018   K 3.5 10/08/2018   CO2 25 10/08/2018   GLUCOSE 104 (H) 10/08/2018   BUN 10 10/08/2018   CREATININE 0.67 10/08/2018   BILITOT <0.2 08/17/2018   ALKPHOS 86 08/17/2018   AST 13 08/17/2018   ALT 14 08/17/2018   PROT 5.8 (L) 08/17/2018   ALBUMIN 3.8 08/17/2018   CALCIUM 11.2 (H) 10/08/2018   ANIONGAP 8 10/08/2018   Lab Results  Component Value Date   CHOL 119 08/17/2018   Lab Results  Component Value Date   HDL 41 08/17/2018   Lab Results  Component Value Date   LDLCALC 56 08/17/2018   Lab Results  Component Value Date   TRIG 109 08/17/2018   Lab Results  Component Value Date   CHOLHDL 2.9 08/17/2018   Lab Results  Component Value Date   HGBA1C 5.6 08/17/2018      Assessment & Plan:     1. HTN (hypertension), malignant - She will continue on current treatment regimen. -Low salt DASH diet -Take medications regularly on time -Exercise  regularly as tolerated -Check blood pressure at least once a week at home or nearby pharmacy and record -Goal  is less than 140/90 and normal blood pressure is 120/80  2. Urinary tract infection due to Proteus - She will complete the course of Ciprofloxacin 500 mg bid x 10 days -Monitor for effect and side effect as discussed -Drink plenty of fluid and rest -Practice good personal hygiene -Always wipe from front to back -Empty bladder as soon as you feel urge or regularly every 3 hours -Wear cotton underwear and loose fitting clothes. - Will collect urine for UA/culture during office visit.  3. Compression fracture of L1 vertebra, initial encounter [facility]) - She will continue taking Percocet as  needed for pain and will follow up with Dr Justice Rocher.   Follow-up: Return in about 20 days (around 10/31/2018), or if symptoms worsen or fail to  improve.    Keaira Whitehurst Trellis Paganini, NP

## 2018-10-24 ENCOUNTER — Other Ambulatory Visit: Payer: Self-pay

## 2018-10-24 ENCOUNTER — Ambulatory Visit: Payer: Self-pay | Admitting: Pharmacist

## 2018-10-24 ENCOUNTER — Ambulatory Visit: Payer: Self-pay | Admitting: Pharmacy Technician

## 2018-10-24 DIAGNOSIS — Z79899 Other long term (current) drug therapy: Secondary | ICD-10-CM

## 2018-10-24 NOTE — Progress Notes (Signed)
  [facility] Visit Note  Patient: Sheila [name] MRN: [id number] Date of Birth: Aug 05, 1961 PCP: Patient, No Pcp Per   Sheila [name] 58 y.o. female presents for a Medication Management visit today.  LMP 08/09/2008 (Within Years)   Patient Information   Past Medical History:  Diagnosis Date  . Aorto-iliac atherosclerosis (HCC) 01/06/2016  . Asthma   . COPD (chronic obstructive pulmonary disease) (HCC)   . Hypertension   . Tobacco abuse 01/06/2016  . Vitamin D deficiency 01/06/2016      Past Surgical History:  Procedure Laterality Date  . ANKLE FRACTURE SURGERY Left 11/07/2009  . left ankle hardware removal    . left ankle surgery    . LOWER EXTREMITY ANGIOGRAPHY Left 10/17/2017   Procedure: LOWER EXTREMITY ANGIOGRAPHY;  Surgeon: Annice Needy, MD;  Location: [facility] INVASIVE CV [facility];  Service: Cardiovascular;  Laterality: Left;     Family History  Problem Relation Age of Onset  . COPD Mother   . Heart failure Mother   . COPD Father   . Heart failure Father     New Diagnoses (since last visit):   Family Support: Good  Lifestyle Diet: Breakfast:egs and bacon Lunch: hot dogs Dinner: chicken, stewed potatoes, beans Drinks: water and dt. Mountain dew            Social History   Substance and Sexual Activity  Alcohol Use No  . Alcohol/week: 0.0 standard drinks      Social History   Tobacco Use  Smoking Status Current Every Day Smoker  . Packs/day: 1.00  . Years: 30.00  . Pack years: 30.00  . Types: Cigarettes  Smokeless Tobacco Never Used      Health Maintenance  Topic Date Due  . Hepatitis C Screening  19-Aug-1960  . TETANUS/TDAP  04/10/1979  . PAP SMEAR-Modifier  04/09/1981  . MAMMOGRAM  04/09/2010  . COLONOSCOPY  04/09/2010  . INFLUENZA VACCINE  03/09/2018  . HIV Screening  Completed     Assessment and Plan: 1. Hypertension (amlodipine, clonidine, hydralazine): checks 1-2/day. usually 120-130/90-95. Has stopped taking  Amlodipine and Lisinopril. Urged that these are scheduled medications for BP and to discuss this with PCP before discontinuing. Warned about rebound hypertension with clonidine, to not stop this drug abruptly. Did not want BP checked today due to hand/arm injury.  2. UTI S/S: recently treated for UTI. Continues t have mild flank pain, dysuria - urged to contact PCP today; patient stated she would.  3. Smoking Cessation: is not interested in quitting at this time as she has tried in the past.  Misses maybe one dose (usually morning dose) in one week time frame. Does not need pill box.  Mauri Reading, PharmD Pharmacy Resident  10/24/2018 10:24 AM

## 2018-10-24 NOTE — Progress Notes (Signed)
Met with patient completed financial assistance application for recertification. Completed [facility] application.    Patient approved to receive medication assistance as long as eligibility criteria continues to be met.    Provided patient with community resource material based on her particular needs.    [facility]

## 2018-11-07 ENCOUNTER — Ambulatory Visit: Payer: Self-pay | Admitting: Gerontology

## 2018-11-07 ENCOUNTER — Ambulatory Visit: Payer: Self-pay | Admitting: Specialist

## 2018-11-07 ENCOUNTER — Encounter: Payer: Self-pay | Admitting: Gerontology

## 2018-11-07 ENCOUNTER — Other Ambulatory Visit: Payer: Self-pay

## 2018-11-07 DIAGNOSIS — S32010A Wedge compression fracture of first lumbar vertebra, initial encounter for closed fracture: Secondary | ICD-10-CM

## 2018-11-07 DIAGNOSIS — I10 Essential (primary) hypertension: Secondary | ICD-10-CM

## 2018-11-07 DIAGNOSIS — K219 Gastro-esophageal reflux disease without esophagitis: Secondary | ICD-10-CM

## 2018-11-07 DIAGNOSIS — N390 Urinary tract infection, site not specified: Secondary | ICD-10-CM

## 2018-11-07 MED ORDER — PANTOPRAZOLE SODIUM 40 MG PO TBEC: 40.0000 mg | DELAYED_RELEASE_TABLET | Freq: Every day | ORAL | 4 refills | Status: DC

## 2018-11-07 NOTE — Patient Instructions (Signed)

## 2018-11-07 NOTE — Progress Notes (Signed)
Established Patient Office Visit  Subjective:  Patient ID: Sheila [name], female    DOB: [birth year]  Age: 58 y.o. MRN: [id number]  CC:  Chief Complaint  Patient presents with  . Follow-up    high blood pressure    HPI Sheila [name] presents for follow up for hypertension, lower back pain, gerd and recurrent UTI.  Hypertension: She states that her blood pressure was 138/92 when checked this morning and continues to take medication as ordered.She denies headache, dizziness, chest pain and palpitations.   Lower back pain: She states that she continues to experience constant dull 4/10 pain to her lower back, that radiates to both legs. She takes 1000 mg tylenol 3 times daily with minimal relief. She denies weakness to bilateral legs, bladder or bowel incontinence.   UTI: She denies dysuria, flank pain, fever, chills, but continues to experience urinary frequency and urgency.  Gerd: She reports that she's having acid reflux sometimes after she  eats,and she takes 40 mg protonix before bedtime.  She denies abdominal pain, nausea and vomiting.  Otherwise she denies further concerns.  Past Medical History:  Diagnosis Date  . Aorto-iliac atherosclerosis (HCC) 01/06/2016  . Asthma   . COPD (chronic obstructive pulmonary disease) (HCC)   . Hypertension   . Osteoporosis   . Tobacco abuse 01/06/2016  . Vitamin D deficiency 01/06/2016    Past Surgical History:  Procedure Laterality Date  . ANKLE FRACTURE SURGERY Left 11/07/2009  . left ankle hardware removal    . left ankle surgery    . LOWER EXTREMITY ANGIOGRAPHY Left 10/17/2017   Procedure: LOWER EXTREMITY ANGIOGRAPHY;  Surgeon: Annice Needy, MD;  Location: [facility] INV[facility]IVE CV [facility];  Service: Cardiovascular;  Laterality: Left;  . TUBAL LIGATION      Family History  Problem Relation Age of Onset  . COPD Mother   . Heart failure Mother   . COPD Father   . Heart failure Father     Social History   Socioeconomic History  . Marital  status: Divorced    Spouse name: Not on file  . Number of children: Not on file  . Years of education: Not on file  . Highest education level: GED or equivalent  Occupational History  . Occupation: unemployed  Social Needs  . Financial resource strain: Somewhat hard  . Food insecurity:    Worry: Never true    Inability: Never true  . Transportation needs:    Medical: No    Non-medical: No  Tobacco Use  . Smoking status: Current Every Day Smoker    Packs/day: 1.00    Years: 30.00    Pack years: 30.00    Types: Cigarettes  . Smokeless tobacco: Never Used  Substance and Sexual Activity  . Alcohol use: No    Alcohol/week: 0.0 standard drinks  . Drug use: No  . Sexual activity: Never  Lifestyle  . Physical activity:    Days per week: 6 days    Minutes per session: 40 min  . Stress: To some extent  Relationships  . Social connections:    Talks on phone: More than three times a week    Gets together: Three times a week    Attends religious service: 1 to 4 times per year    Active member of club or organization: No    Attends meetings of clubs or organizations: Never    Relationship status: Living with partner  . Intimate partner violence:    Fear  of current or ex partner: No    Emotionally abused: No    Physically abused: No    Forced sexual activity: No  Other Topics Concern  . Not on file  Social History Narrative   Food stamps for both her and friend she has been with for 23 years. Some months run low, but always make it.     Outpatient Medications Prior to Visit  Medication Sig Dispense Refill  . amLODipine (NORVASC) 10 MG tablet Take 1 tablet (10 mg total) by mouth daily. 90 tablet 3  . aspirin EC 81 MG tablet Take by mouth.    . cloNIDine (CATAPRES) 0.3 MG tablet Take 1 tablet (0.3 mg total) by mouth 3 (three) times daily. 60 tablet 3  . clopidogrel (PLAVIX) 75 MG tablet TAKE ONE TABLET BY MOUTH EVERY DAY 90 tablet 0  . fluticasone (FLONASE) 50 MCG/ACT nasal  spray Place 2 sprays into both nostrils daily. (Patient taking differently: Place 2 sprays into both nostrils daily. As needed) 16 g 0  . gabapentin (NEURONTIN) 300 MG capsule TAKE ONE CAPSULE BY MOUTH AT BEDTIME (Patient taking differently: As needed - for neuropathies) 30 capsule 0  . hydrALAZINE (APRESOLINE) 25 MG tablet Take 1 tablet (25 mg total) by mouth 2 (two) times daily. 60 tablet 2  . lisinopril (PRINIVIL,ZESTRIL) 40 MG tablet Take 1 tablet (40 mg total) by mouth daily. 90 tablet 4  . ondansetron (ZOFRAN ODT) 4 MG disintegrating tablet Take 1 tablet (4 mg total) by mouth every 8 (eight) hours as needed for nausea or vomiting. 20 tablet 0  . phenazopyridine (PYRIDIUM) 200 MG tablet Take 1 tablet (200 mg total) by mouth daily. 2 tablet 0  . sodium chloride (OCEAN) 0.65 % SOLN nasal spray Place 1 spray into both nostrils as needed for congestion. 1 Bottle 0  . oxyCODONE-acetaminophen (PERCOCET) 5-325 MG tablet Take 1 tablet by mouth every 8 (eight) hours as needed. 20 tablet 0  . pantoprazole (PROTONIX) 40 MG tablet Take 1 tablet (40 mg total) by mouth daily. 30 tablet 3  . atorvastatin (LIPITOR) 10 MG tablet Take 1 tablet (10 mg total) by mouth daily. 30 tablet 11   No facility-administered medications prior to visit.     Allergies  Allergen Reactions  . Vicodin [Hydrocodone-Acetaminophen] Nausea And Vomiting  . Vicodin [Hydrocodone-Acetaminophen]     Abd pain, N/V  . Hct [Hydrochlorothiazide] Palpitations  . Tramadol Palpitations    ROS Review of Systems  Constitutional: Negative.   HENT: Positive for sinus pressure (chronic seasonal allergy).   Respiratory: Negative.   Cardiovascular: Negative.   Gastrointestinal: Negative.   Genitourinary: Negative.   Musculoskeletal: Positive for back pain (dull lower back pain).  Skin: Negative.   Neurological: Negative.   Psychiatric/Behavioral: Negative.       Objective:    Physical Exam  LMP 08/09/2008 (Within Years)  Wt  Readings from Last 3 Encounters:  10/11/18 158 lb 8 oz (71.9 kg)  10/08/18 158 lb (71.7 kg)  10/05/18 156 lb 4.8 oz (70.9 kg)     Health Maintenance Due  Topic Date Due  . Hepatitis C Screening  [birth year]  . TETANUS/TDAP  04/10/1979  . PAP SMEAR-Modifier  04/09/1981  . MAMMOGRAM  04/09/2010  . COLONOSCOPY  04/09/2010  . INFLUENZA VACCINE  03/09/2018    There are no preventive care reminders to display for this patient.  Lab Results  Component Value Date   TSH 2.070 08/17/2018   Lab Results  Component Value  Date   WBC 9.5 10/08/2018   HGB 14.0 10/08/2018   HCT 44.5 10/08/2018   MCV 98.7 10/08/2018   PLT 391 10/08/2018   Lab Results  Component Value Date   NA 143 10/08/2018   K 3.5 10/08/2018   CO2 25 10/08/2018   GLUCOSE 104 (H) 10/08/2018   BUN 10 10/08/2018   CREATININE 0.67 10/08/2018   BILITOT <0.2 08/17/2018   ALKPHOS 86 08/17/2018   AST 13 08/17/2018   ALT 14 08/17/2018   PROT 5.8 (L) 08/17/2018   ALBUMIN 3.8 08/17/2018   CALCIUM 11.2 (H) 10/08/2018   ANIONGAP 8 10/08/2018   Lab Results  Component Value Date   CHOL 119 08/17/2018   Lab Results  Component Value Date   HDL 41 08/17/2018   Lab Results  Component Value Date   LDLCALC 56 08/17/2018   Lab Results  Component Value Date   TRIG 109 08/17/2018   Lab Results  Component Value Date   CHOLHDL 2.9 08/17/2018   Lab Results  Component Value Date   HGBA1C 5.6 08/17/2018      Assessment & Plan:    1. Gastroesophageal reflux disease without esophagitis - She was advised to start taking Protonix 30-60 minutes without meals in the morning. Will follow up in 2 weeks. - pantoprazole (PROTONIX) 40 MG tablet; Take 1 tablet (40 mg total) by mouth daily.  Dispense: 30 tablet; Refill: 4 . -Avoid spicy, fatty and fried food . Avoid sodas and sour juices . Avoid heavy meals . Avoid eating 4 hours before bedtime . Elevate head of bed at night . May use OTC Maalox or Tums as needed for heart  burns or pain in upper stomach   2. HTN (hypertension), malignant - Per patient her blood pressure is treading down. She will continue on current treatment regimen. . - Low salt DASH diet . Take medications regularly on time . Exercise regularly as tolerated . Check blood pressure at least once a week at home or a nearby pharmacy and record . Goal is less than 140/90 and normal blood pressure is 120/80    3. Urinary tract infection without hematuria, site unspecified  - Urinalysis; will be rechecked 11/08/2018. - UA/M w/rflx Culture, Routine; Future . - Drink plenty of flui . Practice good personal hygiene . Always wipe from front to back . Empty bladder as soon as you feel urge or regularly every 3 hours . Wear cotton underwear and loose fitting clothes   4. Compression fracture of L1 vertebra, initial encounter (HCC) -She will continue taking 1000 mg Tylenol every 8 hours as needed. -She will notify clinic for worsening back pain. - She will follow up with Dr [facility]    Follow-up: Return in about 2 weeks (around 11/21/2018), or if symptoms worsen or fail to improve.    Tejasvi Brissett Trellis Paganini, NP

## 2018-11-08 ENCOUNTER — Other Ambulatory Visit: Payer: Self-pay

## 2018-12-26 ENCOUNTER — Telehealth: Payer: Self-pay

## 2018-12-26 NOTE — Telephone Encounter (Signed)
Called Sheila [name] to make followup appt to discuss blood pressure. Patient stated she does not need appt at this time. She has a new primary care doctor following her blood pressure.

## 2019-03-10 IMAGING — CT CT HEAD W/O CM
3 series · 15 of 44 positions shown, 18 images · non-contrast
Comparison: None.

CLINICAL DATA: Mild headache.  Feeling off balance for 2 days.

EXAM:
CT HEAD WITHOUT CONTRAST
TECHNIQUE: Contiguous axial images were obtained from the base of the skull
through the vertex without intravenous contrast.

[Series 3: head wo · axial · 0.39mm/px · z∈[-124,-14]mm · 9 of 27 slices shown, 12 images]
[im 3/27  brain]
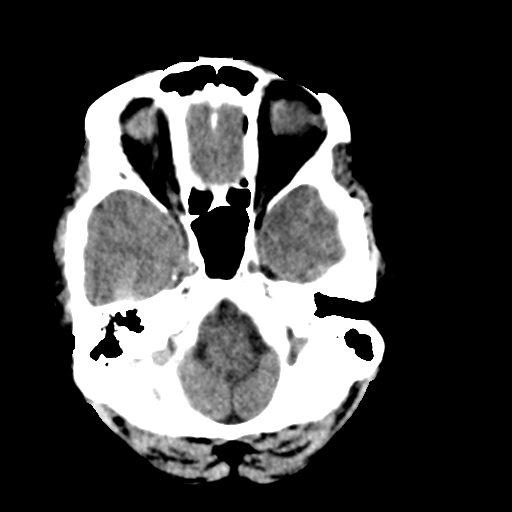
[im 3/27  bone]
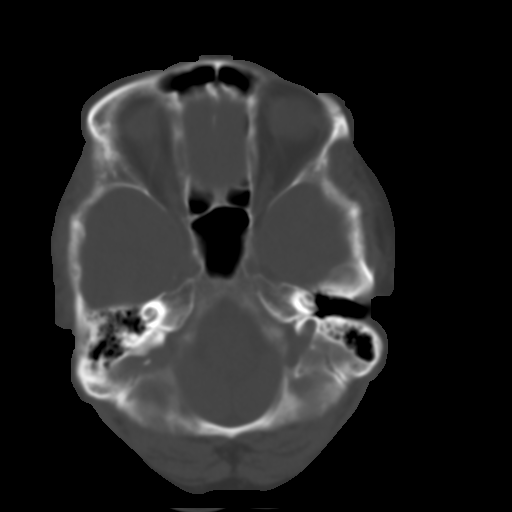
[im 6/27  brain]
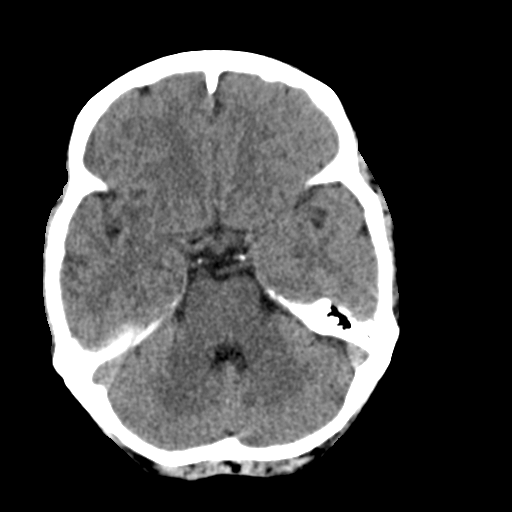
[im 8/27  brain]
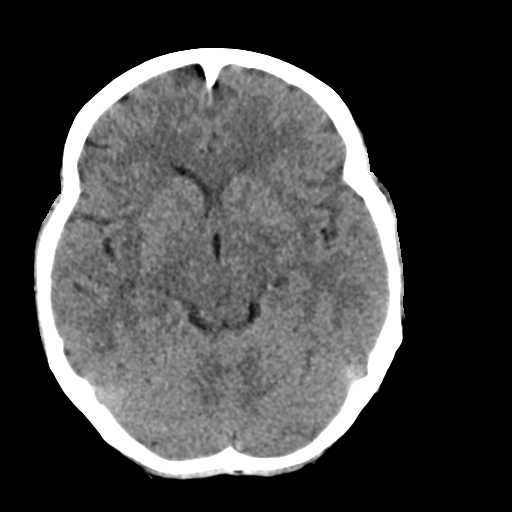
[im 11/27  brain]
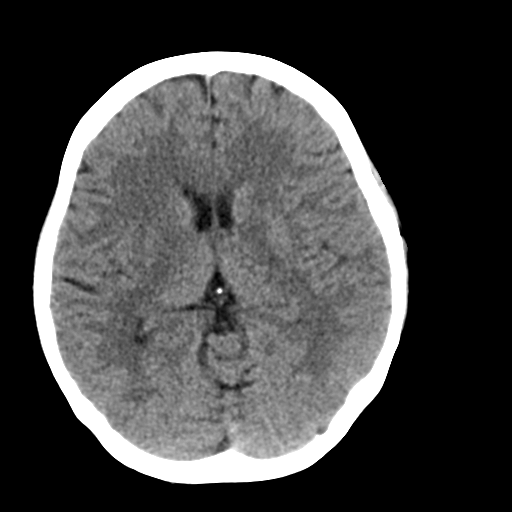
[im 14/27  brain]
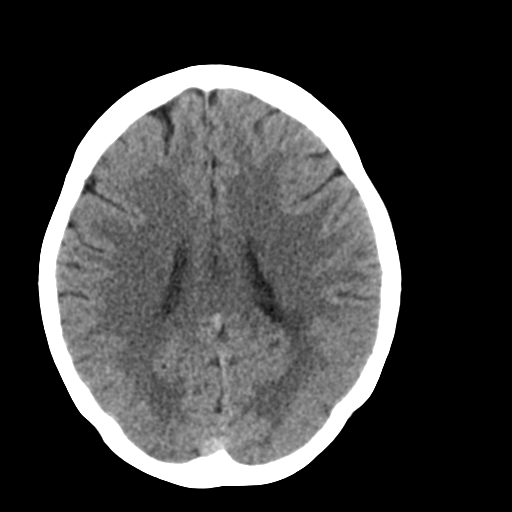
[im 14/27  bone]
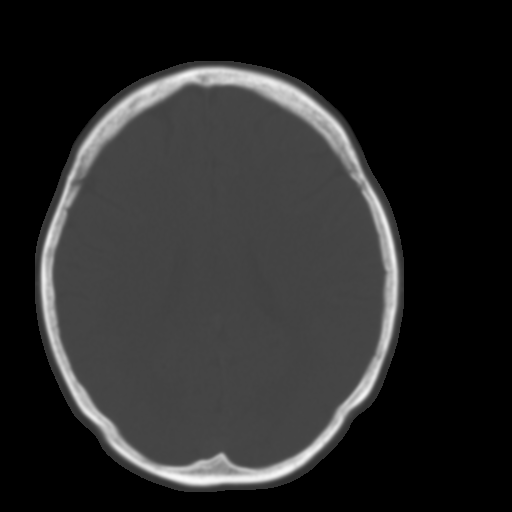
[im 17/27  brain]
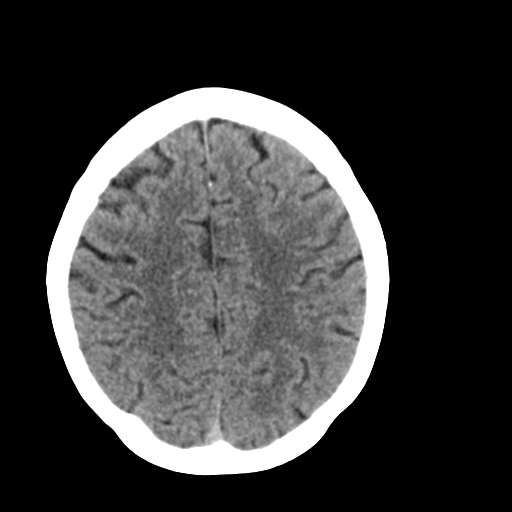
[im 20/27  brain]
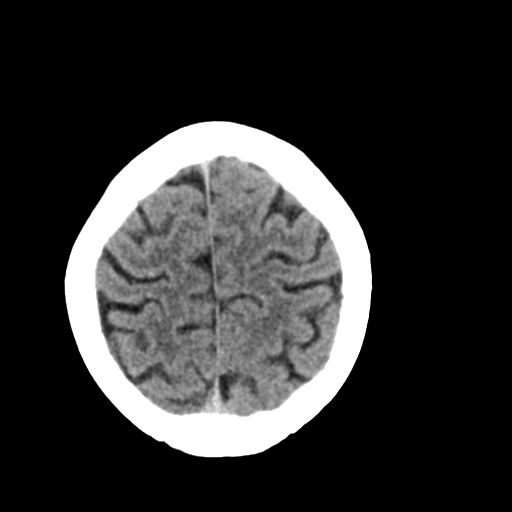
[im 22/27  brain]
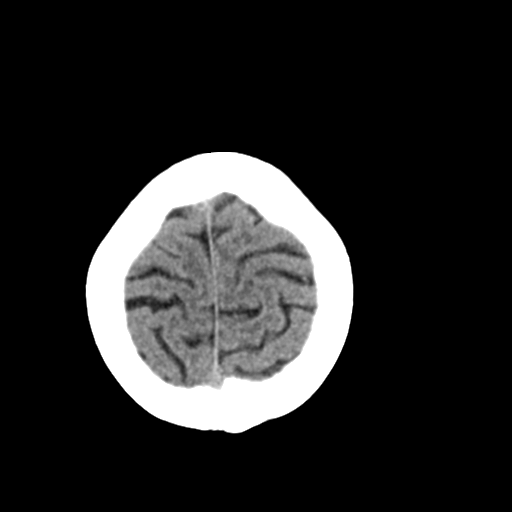
[im 25/27  brain]
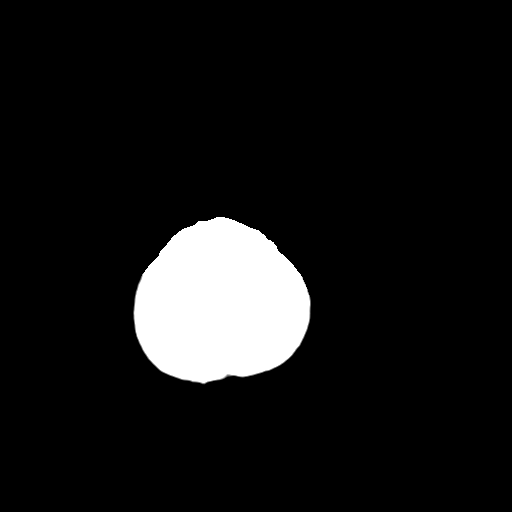
[im 25/27  bone]
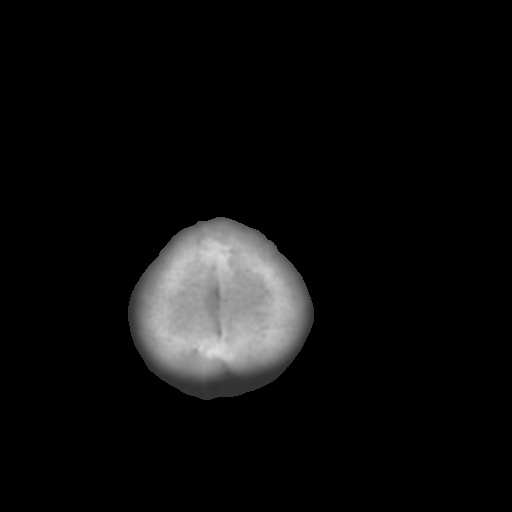

[Series 4: coronal soft tissue · coronal · 0.26mm/px · 3 of 58 slices shown]
[im 20/58  brain]
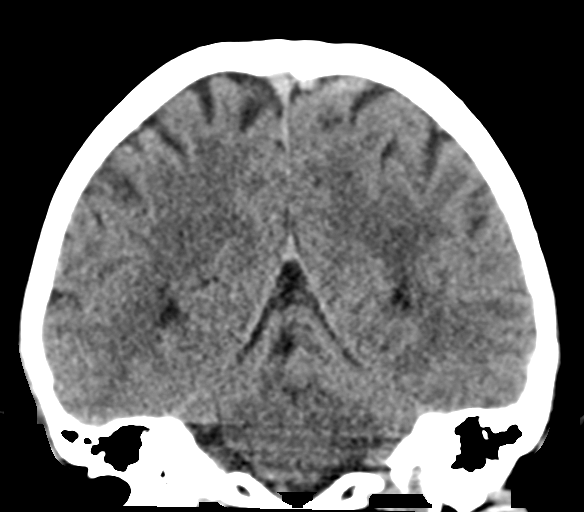
[im 26/58  brain]
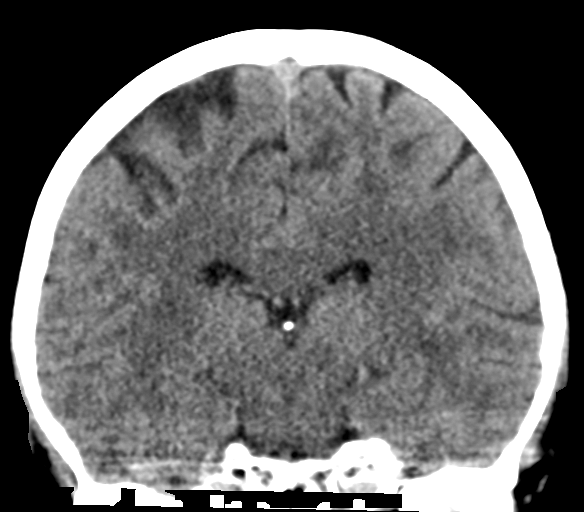
[im 32/58  brain]
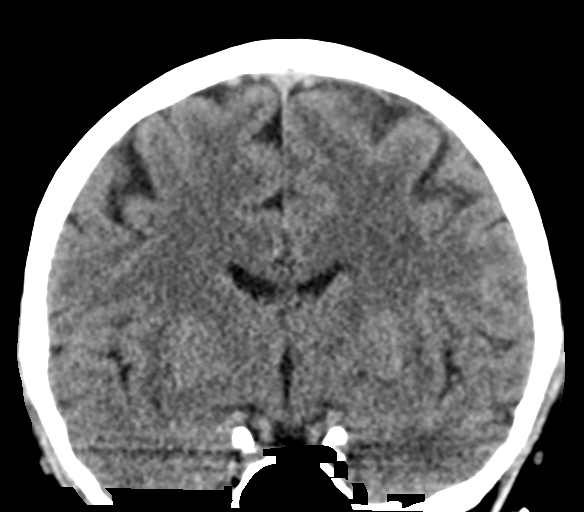

[Series 5: sagittal soft tissue · sagittal · 0.26mm/px · 3 of 52 slices shown]
[im 18/52  brain]
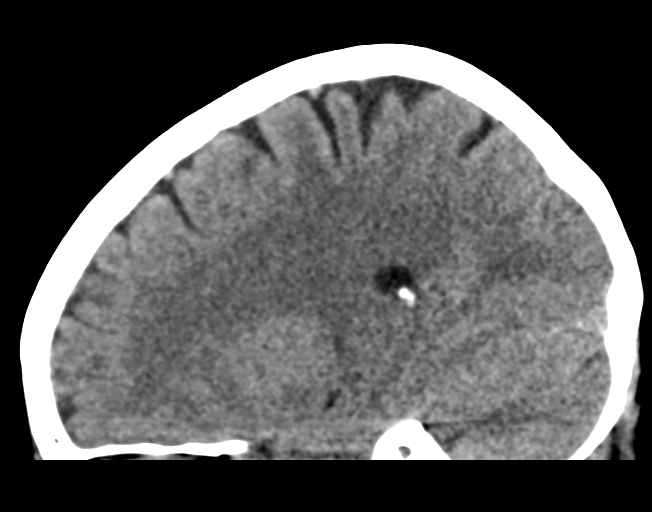
[im 26/52  brain]
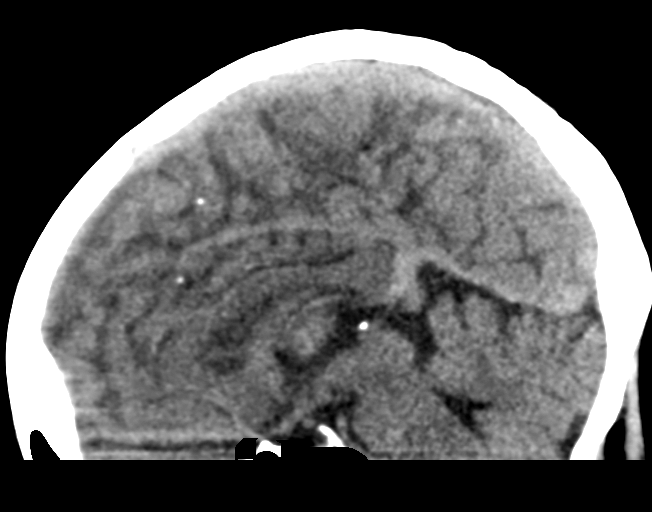
[im 35/52  brain]
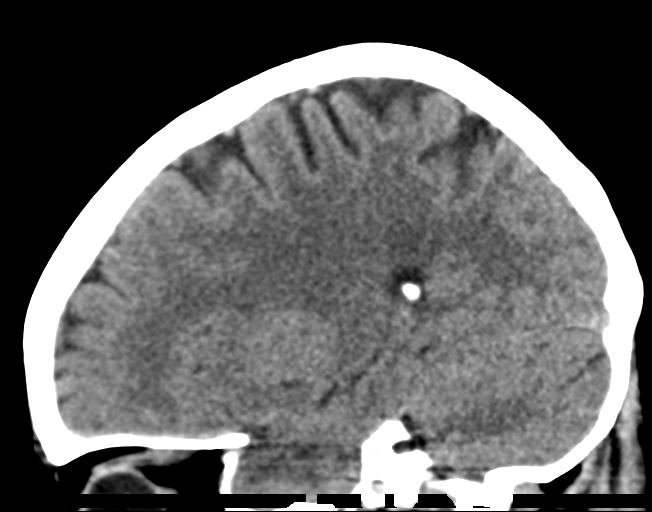

[15 of 44 positions shown; findings below may reference images not displayed]

FINDINGS: Brain: No evidence of acute infarction, hemorrhage, hydrocephalus,
extra-axial collection or mass lesion/mass effect. Suspect minimal
white matter low-density, most convincing left periatrial. This
would likely be microvascular ischemic given patient's vascular risk
factors.

Vascular: Atherosclerotic calcification.  No hyperdense vessel

Skull: No acute or aggressive finding.

Sinuses/Orbits: No acute finding.

Other: 1 slice of the right middle cranial fossa is excluded from
view.
IMPRESSION: No acute finding or explanation for the history.

## 2019-03-10 IMAGING — CR DG CHEST 2V
1 series · 2 of 2 positions shown · non-contrast
Comparison: 12/04/2015

CLINICAL DATA: Left-sided chest pain which shortness-of-breath
since yesterday.

EXAM:
CHEST  2 VIEW

[Series 1: dg chest 2 view · 0.14mm/px · 2 of 2 slices shown]
[im 1/2]
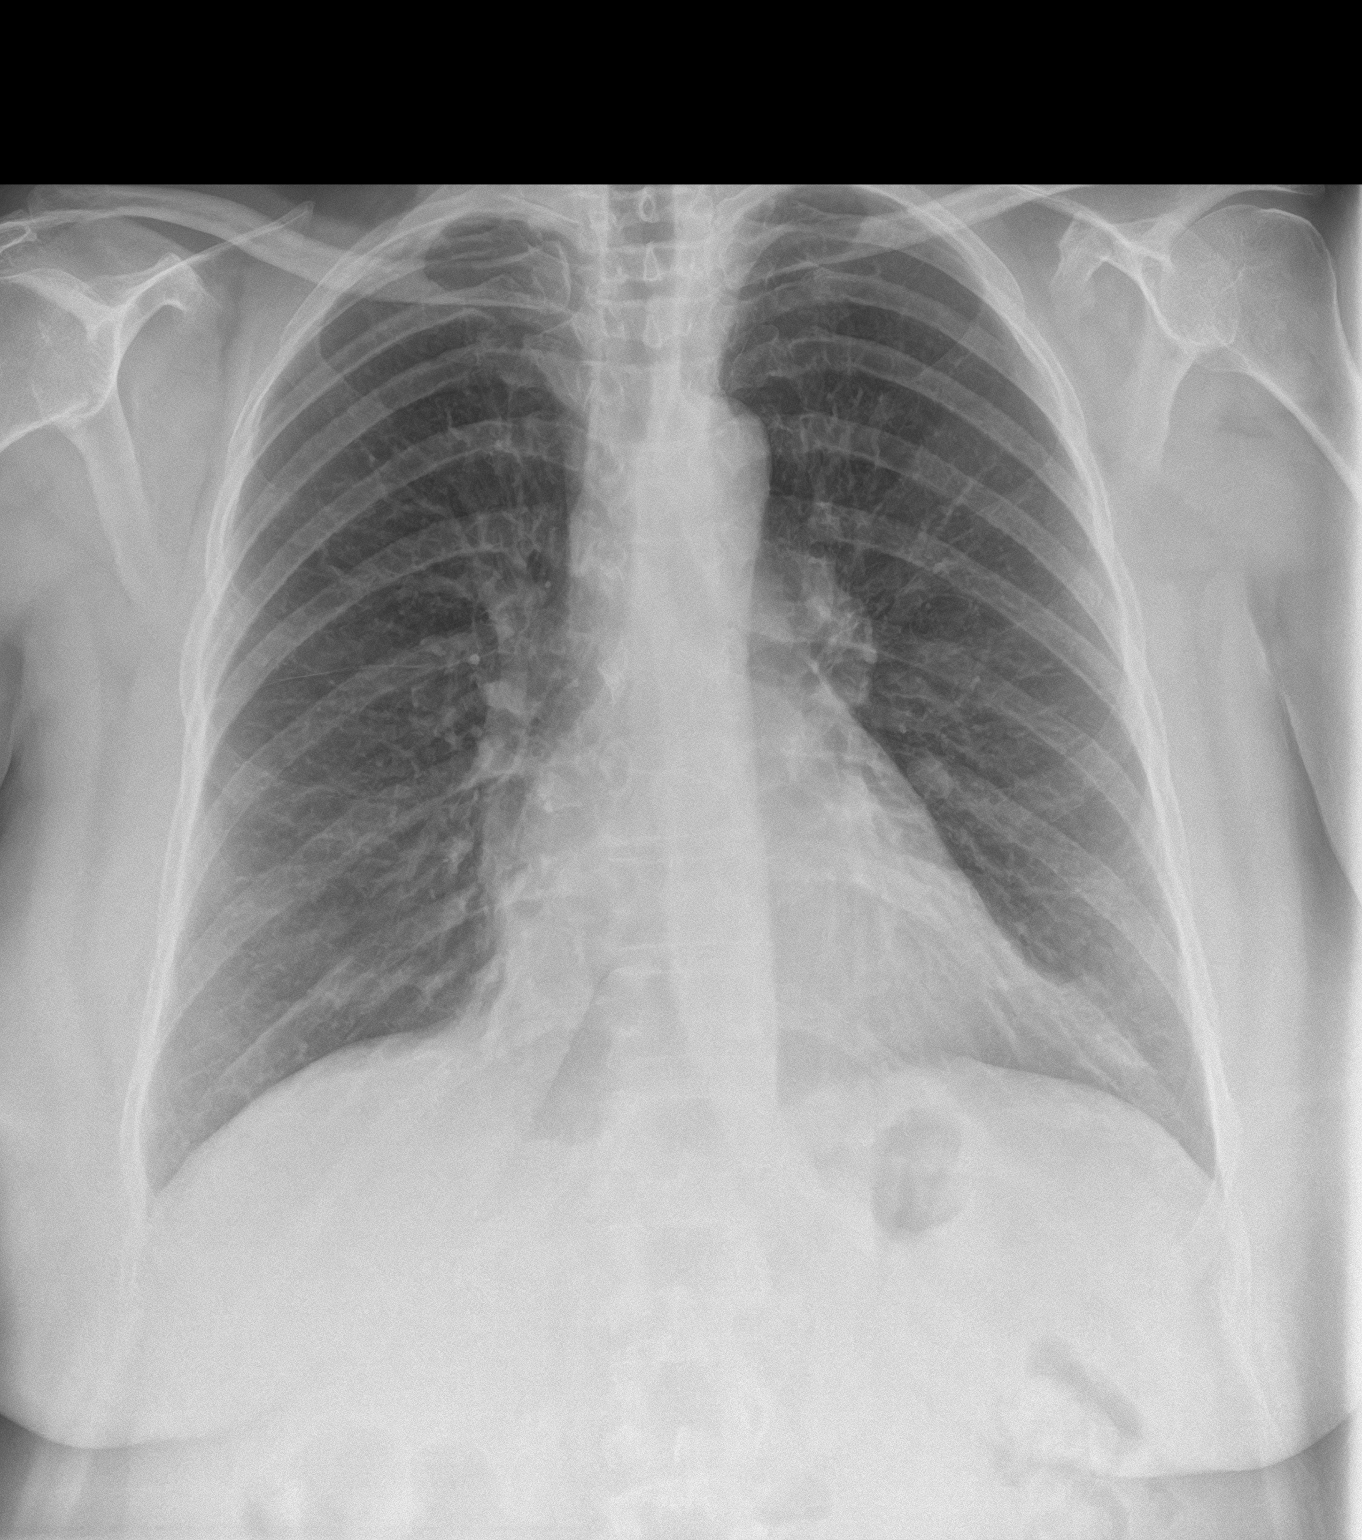
[im 2/2]
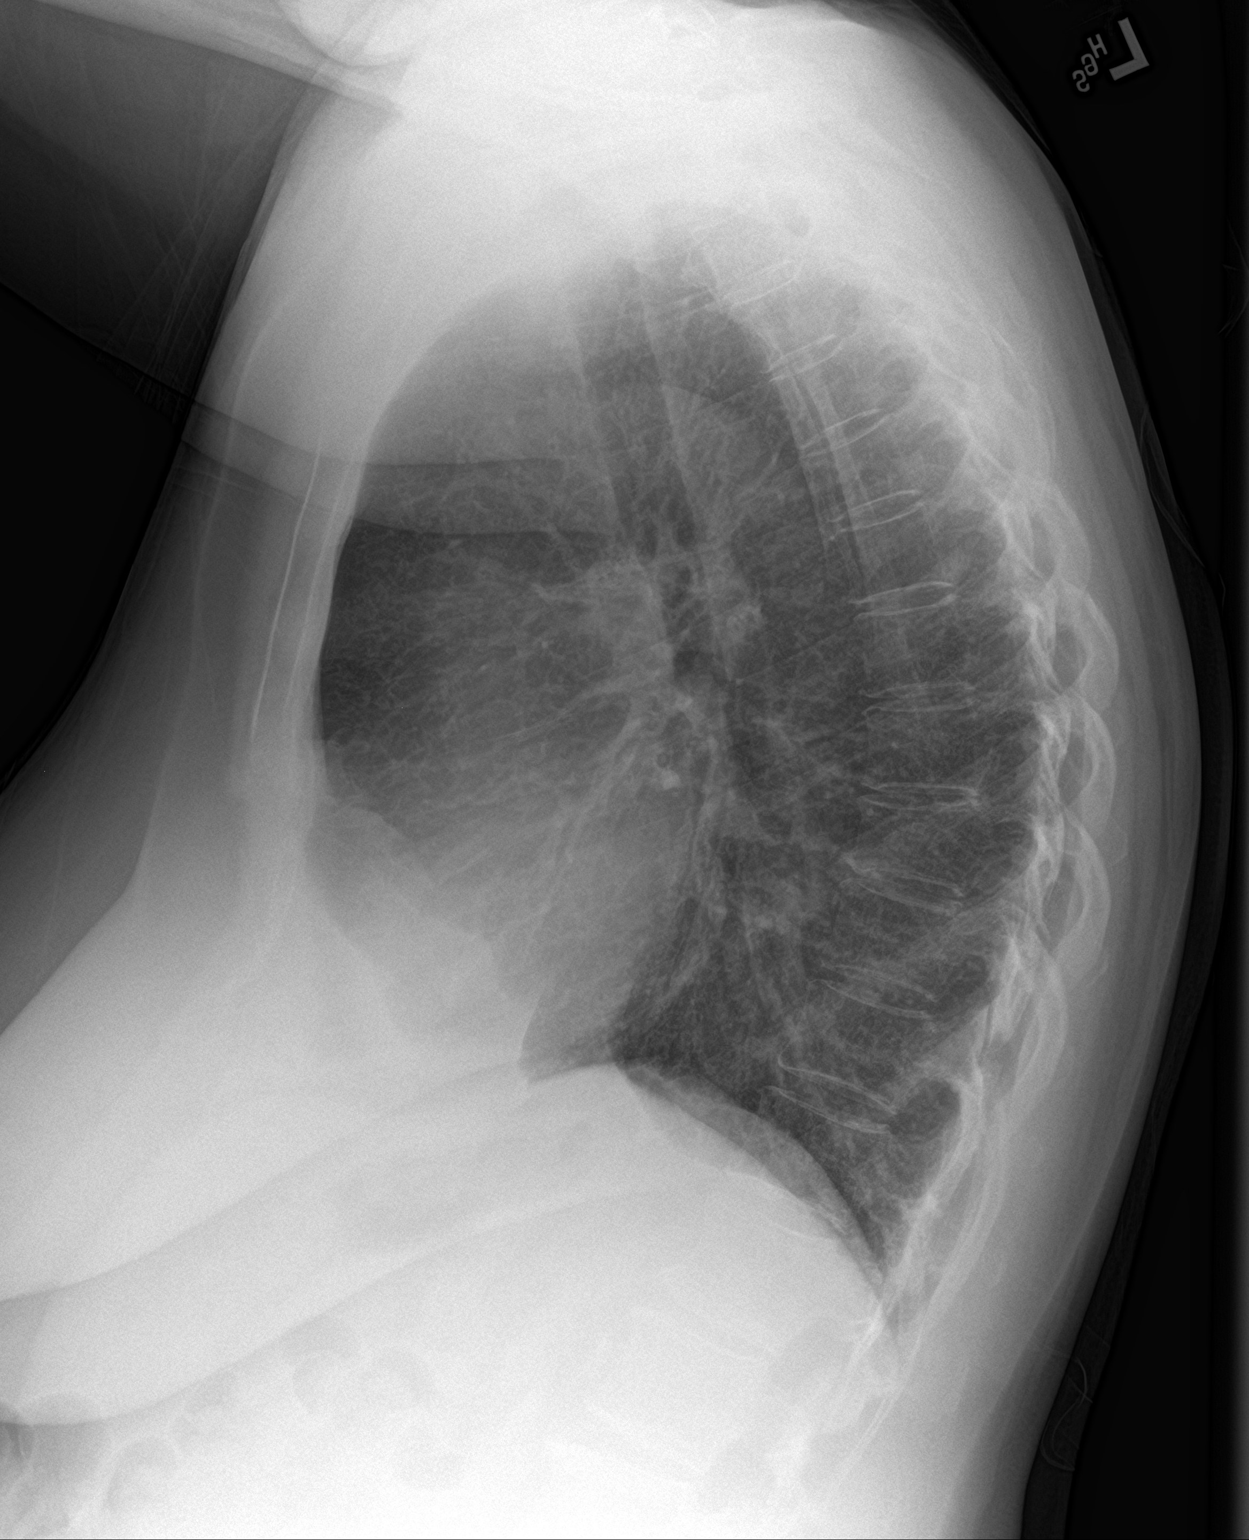

[2 of 2 positions shown; findings below may reference images not displayed]

FINDINGS: Lungs are adequately inflated without consolidation or effusion.
Possible 1 cm nodular density adjacent the left heart border.
Cardiomediastinal silhouette is within normal. Remaining bones and
soft tissues are normal.
IMPRESSION: No acute cardiopulmonary disease.

Possible 1 cm nodule adjacent the left heart border. Recommend
noncontrast chest CT on an elective basis for further evaluation.

## 2019-03-28 ENCOUNTER — Other Ambulatory Visit (INDEPENDENT_AMBULATORY_CARE_PROVIDER_SITE_OTHER): Payer: Self-pay

## 2019-03-28 ENCOUNTER — Ambulatory Visit (INDEPENDENT_AMBULATORY_CARE_PROVIDER_SITE_OTHER): Payer: Self-pay | Admitting: Nurse Practitioner

## 2019-03-28 ENCOUNTER — Encounter (INDEPENDENT_AMBULATORY_CARE_PROVIDER_SITE_OTHER): Payer: Self-pay

## 2019-05-04 ENCOUNTER — Encounter (INDEPENDENT_AMBULATORY_CARE_PROVIDER_SITE_OTHER): Payer: Self-pay | Admitting: Nurse Practitioner

## 2019-10-11 ENCOUNTER — Telehealth: Payer: Self-pay | Admitting: Cardiovascular Disease

## 2019-10-11 NOTE — Telephone Encounter (Signed)
3 attempts to schedule fu appt from recall list.   Deleting recall.   

## 2019-10-23 ENCOUNTER — Telehealth: Payer: Self-pay | Admitting: Pharmacy Technician

## 2019-10-23 NOTE — Telephone Encounter (Signed)
Patient failed to provide 2021 proof of income.  Also, it has been greater than 6 months since patient has requested medication assistance at [facility].  Patient is being made inactive.  Medication assistance can resume once patient provides updated financial information.  Patient notified.  Sherilyn Dacosta Care Manager [facility]

## 2021-08-17 DIAGNOSIS — J209 Acute bronchitis, unspecified: Secondary | ICD-10-CM | POA: Insufficient documentation

## 2022-11-26 ENCOUNTER — Other Ambulatory Visit: Payer: Self-pay

## 2022-11-26 ENCOUNTER — Emergency Department: Payer: Medicaid Other

## 2022-11-26 ENCOUNTER — Inpatient Hospital Stay
Admission: EM | Admit: 2022-11-26 | Discharge: 2022-11-30 | DRG: 281 | Disposition: A | Discharge status: Home / Self Care | Payer: Medicaid Other | Attending: Internal Medicine | Admitting: Internal Medicine

## 2022-11-26 DIAGNOSIS — I1A0 Resistant hypertension: Secondary | ICD-10-CM | POA: Diagnosis present

## 2022-11-26 DIAGNOSIS — Z720 Tobacco use: Secondary | ICD-10-CM

## 2022-11-26 DIAGNOSIS — R079 Chest pain, unspecified: Secondary | ICD-10-CM | POA: Diagnosis present

## 2022-11-26 DIAGNOSIS — Z888 Allergy status to other drugs, medicaments and biological substances status: Secondary | ICD-10-CM

## 2022-11-26 DIAGNOSIS — Z8249 Family history of ischemic heart disease and other diseases of the circulatory system: Secondary | ICD-10-CM | POA: Diagnosis not present

## 2022-11-26 DIAGNOSIS — R001 Bradycardia, unspecified: Secondary | ICD-10-CM | POA: Diagnosis not present

## 2022-11-26 DIAGNOSIS — I4891 Unspecified atrial fibrillation: Secondary | ICD-10-CM | POA: Diagnosis not present

## 2022-11-26 DIAGNOSIS — Z825 Family history of asthma and other chronic lower respiratory diseases: Secondary | ICD-10-CM

## 2022-11-26 DIAGNOSIS — J4489 Other specified chronic obstructive pulmonary disease: Secondary | ICD-10-CM | POA: Diagnosis present

## 2022-11-26 DIAGNOSIS — F17210 Nicotine dependence, cigarettes, uncomplicated: Secondary | ICD-10-CM | POA: Diagnosis present

## 2022-11-26 DIAGNOSIS — Z885 Allergy status to narcotic agent status: Secondary | ICD-10-CM | POA: Diagnosis not present

## 2022-11-26 DIAGNOSIS — R739 Hyperglycemia, unspecified: Secondary | ICD-10-CM | POA: Diagnosis present

## 2022-11-26 DIAGNOSIS — M810 Age-related osteoporosis without current pathological fracture: Secondary | ICD-10-CM | POA: Diagnosis present

## 2022-11-26 DIAGNOSIS — I700 Atherosclerosis of aorta: Secondary | ICD-10-CM | POA: Diagnosis present

## 2022-11-26 DIAGNOSIS — Z95820 Peripheral vascular angioplasty status with implants and grafts: Secondary | ICD-10-CM | POA: Diagnosis not present

## 2022-11-26 DIAGNOSIS — E559 Vitamin D deficiency, unspecified: Secondary | ICD-10-CM | POA: Diagnosis present

## 2022-11-26 DIAGNOSIS — K219 Gastro-esophageal reflux disease without esophagitis: Secondary | ICD-10-CM | POA: Diagnosis present

## 2022-11-26 DIAGNOSIS — J449 Chronic obstructive pulmonary disease, unspecified: Secondary | ICD-10-CM | POA: Diagnosis present

## 2022-11-26 DIAGNOSIS — J42 Unspecified chronic bronchitis: Secondary | ICD-10-CM | POA: Diagnosis not present

## 2022-11-26 DIAGNOSIS — E876 Hypokalemia: Secondary | ICD-10-CM | POA: Diagnosis not present

## 2022-11-26 DIAGNOSIS — E785 Hyperlipidemia, unspecified: Secondary | ICD-10-CM | POA: Diagnosis present

## 2022-11-26 DIAGNOSIS — I480 Paroxysmal atrial fibrillation: Principal | ICD-10-CM | POA: Diagnosis present

## 2022-11-26 DIAGNOSIS — I5032 Chronic diastolic (congestive) heart failure: Secondary | ICD-10-CM | POA: Diagnosis present

## 2022-11-26 DIAGNOSIS — G3281 Cerebellar ataxia in diseases classified elsewhere: Secondary | ICD-10-CM | POA: Diagnosis present

## 2022-11-26 DIAGNOSIS — I2489 Other forms of acute ischemic heart disease: Secondary | ICD-10-CM

## 2022-11-26 DIAGNOSIS — I21A1 Myocardial infarction type 2: Secondary | ICD-10-CM | POA: Diagnosis present

## 2022-11-26 DIAGNOSIS — I708 Atherosclerosis of other arteries: Secondary | ICD-10-CM | POA: Diagnosis present

## 2022-11-26 DIAGNOSIS — I160 Hypertensive urgency: Secondary | ICD-10-CM | POA: Diagnosis present

## 2022-11-26 DIAGNOSIS — I10 Essential (primary) hypertension: Secondary | ICD-10-CM | POA: Diagnosis present

## 2022-11-26 DIAGNOSIS — J441 Chronic obstructive pulmonary disease with (acute) exacerbation: Secondary | ICD-10-CM | POA: Diagnosis present

## 2022-11-26 DIAGNOSIS — I110 Hypertensive heart disease with heart failure: Secondary | ICD-10-CM | POA: Diagnosis present

## 2022-11-26 DIAGNOSIS — R0609 Other forms of dyspnea: Secondary | ICD-10-CM | POA: Insufficient documentation

## 2022-11-26 DIAGNOSIS — Z79899 Other long term (current) drug therapy: Secondary | ICD-10-CM

## 2022-11-26 DIAGNOSIS — I6529 Occlusion and stenosis of unspecified carotid artery: Secondary | ICD-10-CM | POA: Diagnosis present

## 2022-11-26 DIAGNOSIS — R7989 Other specified abnormal findings of blood chemistry: Secondary | ICD-10-CM

## 2022-11-26 DIAGNOSIS — Z7982 Long term (current) use of aspirin: Secondary | ICD-10-CM | POA: Diagnosis not present

## 2022-11-26 DIAGNOSIS — I214 Non-ST elevation (NSTEMI) myocardial infarction: Secondary | ICD-10-CM | POA: Diagnosis present

## 2022-11-26 DIAGNOSIS — I6523 Occlusion and stenosis of bilateral carotid arteries: Secondary | ICD-10-CM

## 2022-11-26 DIAGNOSIS — I739 Peripheral vascular disease, unspecified: Secondary | ICD-10-CM | POA: Diagnosis not present

## 2022-11-26 LAB — URINALYSIS, W/ REFLEX TO CULTURE (INFECTION SUSPECTED)
Bilirubin Urine: NEGATIVE
Glucose, UA: NEGATIVE mg/dL
Ketones, ur: NEGATIVE mg/dL
Leukocytes,Ua: NEGATIVE
Nitrite: NEGATIVE
Protein, ur: NEGATIVE mg/dL
Specific Gravity, Urine: 1.002 — ABNORMAL LOW (ref 1.005–1.030)
Squamous Epithelial / HPF: NONE SEEN /HPF (ref 0–5)
pH: 7 (ref 5.0–8.0)

## 2022-11-26 LAB — CBC
HCT: 46.5 % — ABNORMAL HIGH (ref 36.0–46.0)
Hemoglobin: 15.2 g/dL — ABNORMAL HIGH (ref 12.0–15.0)
MCH: 30.8 pg (ref 26.0–34.0)
MCHC: 32.7 g/dL (ref 30.0–36.0)
MCV: 94.3 fL (ref 80.0–100.0)
Platelets: 374 10*3/uL (ref 150–400)
RBC: 4.93 MIL/uL (ref 3.87–5.11)
RDW: 13.4 % (ref 11.5–15.5)
WBC: 13.8 10*3/uL — ABNORMAL HIGH (ref 4.0–10.5)
nRBC: 0 % (ref 0.0–0.2)

## 2022-11-26 LAB — BASIC METABOLIC PANEL
Anion gap: 10 (ref 5–15)
BUN: 13 mg/dL (ref 8–23)
CO2: 21 mmol/L — ABNORMAL LOW (ref 22–32)
Calcium: 9.8 mg/dL (ref 8.9–10.3)
Chloride: 100 mmol/L (ref 98–111)
Creatinine, Ser: 0.91 mg/dL (ref 0.44–1.00)
GFR, Estimated: >60 mL/min (ref >60)
Glucose, Bld: 137 mg/dL — ABNORMAL HIGH (ref 70–99)
Potassium: 3.6 mmol/L (ref 3.5–5.1)
Sodium: 131 mmol/L — ABNORMAL LOW (ref 135–145)

## 2022-11-26 LAB — TSH: TSH: 1.663 u[IU]/mL (ref 0.350–4.500)

## 2022-11-26 LAB — PROTIME-INR
INR: 1 (ref 0.8–1.2)
Prothrombin Time: 12.9 seconds (ref 11.4–15.2)

## 2022-11-26 LAB — D-DIMER, QUANTITATIVE: D-Dimer, Quant: 0.4 ug/mL-FEU (ref 0.00–0.50)

## 2022-11-26 LAB — TROPONIN I (HIGH SENSITIVITY)
Troponin I (High Sensitivity): 1402 ng/L (ref <18)
Troponin I (High Sensitivity): 1473 ng/L (ref <18)

## 2022-11-26 LAB — APTT: aPTT: 55 seconds — ABNORMAL HIGH (ref 24–36)

## 2022-11-26 LAB — HEPARIN LEVEL (UNFRACTIONATED): Heparin Unfractionated: 0.2 IU/mL — ABNORMAL LOW (ref 0.30–0.70)

## 2022-11-26 LAB — T4, FREE: Free T4: 0.73 ng/dL (ref 0.61–1.12)

## 2022-11-26 LAB — MAGNESIUM: Magnesium: 1.8 mg/dL (ref 1.7–2.4)

## 2022-11-26 MED ORDER — NITROGLYCERIN 2 % TD OINT: 1.0000 [in_us] | TOPICAL_OINTMENT | Freq: Four times a day (QID) | TRANSDERMAL | Status: AC | PRN

## 2022-11-26 MED ORDER — ACETAMINOPHEN 650 MG RE SUPP: 650.0000 mg | Freq: Four times a day (QID) | RECTAL | Status: DC | PRN

## 2022-11-26 MED ORDER — ONDANSETRON HCL 4 MG/2ML IJ SOLN: 4.0000 mg | Freq: Four times a day (QID) | INTRAMUSCULAR | Status: DC | PRN

## 2022-11-26 MED ORDER — ASPIRIN 81 MG PO TBEC
81.0000 mg | DELAYED_RELEASE_TABLET | Freq: Every day | ORAL | Status: DC
  Administered 2022-11-27 – 2022-11-28 (×2): 81 mg via ORAL
  Filled 2022-11-26 (×4): qty 1

## 2022-11-26 MED ORDER — MORPHINE SULFATE (PF) 2 MG/ML IV SOLN
2.0000 mg | INTRAVENOUS | Status: AC | PRN
  Administered 2022-11-26: 2 mg via INTRAVENOUS
  Filled 2022-11-26: qty 1

## 2022-11-26 MED ORDER — HEPARIN BOLUS VIA INFUSION
4000.0000 [IU] | Freq: Once | INTRAVENOUS | Status: AC
  Administered 2022-11-26: 4000 [IU] via INTRAVENOUS
  Filled 2022-11-26: qty 4000

## 2022-11-26 MED ORDER — HEPARIN (PORCINE) 25000 UT/250ML-% IV SOLN
1350.0000 [IU]/h | INTRAVENOUS | Status: DC
  Administered 2022-11-26: 800 [IU]/h via INTRAVENOUS
  Administered 2022-11-27: 1150 [IU]/h via INTRAVENOUS
  Administered 2022-11-28 – 2022-11-29 (×2): 1350 [IU]/h via INTRAVENOUS
  Filled 2022-11-26 (×4): qty 250

## 2022-11-26 MED ORDER — DILTIAZEM HCL 25 MG/5ML IV SOLN
15.0000 mg | Freq: Once | INTRAVENOUS | Status: AC
  Administered 2022-11-26: 15 mg via INTRAVENOUS
  Filled 2022-11-26: qty 5

## 2022-11-26 MED ORDER — ALPRAZOLAM 0.5 MG PO TABS: 0.5000 mg | ORAL_TABLET | Freq: Four times a day (QID) | ORAL | Status: DC | PRN

## 2022-11-26 MED ORDER — ACETAMINOPHEN 325 MG PO TABS
650.0000 mg | ORAL_TABLET | Freq: Four times a day (QID) | ORAL | Status: DC | PRN
  Administered 2022-11-26 – 2022-11-30 (×5): 650 mg via ORAL
  Filled 2022-11-26 (×4): qty 2

## 2022-11-26 MED ORDER — ATORVASTATIN CALCIUM 10 MG PO TABS
10.0000 mg | ORAL_TABLET | Freq: Every day | ORAL | Status: DC
  Administered 2022-11-26 – 2022-11-29 (×4): 10 mg via ORAL
  Filled 2022-11-26 (×4): qty 1

## 2022-11-26 MED ORDER — NICOTINE 21 MG/24HR TD PT24: 21.0000 mg | MEDICATED_PATCH | Freq: Every day | TRANSDERMAL | Status: DC | PRN

## 2022-11-26 MED ORDER — PANTOPRAZOLE SODIUM 40 MG PO TBEC
80.0000 mg | DELAYED_RELEASE_TABLET | Freq: Every day | ORAL | Status: DC
  Administered 2022-11-26 – 2022-11-30 (×5): 80 mg via ORAL
  Filled 2022-11-26 (×6): qty 2

## 2022-11-26 MED ORDER — ONDANSETRON HCL 4 MG PO TABS: 4.0000 mg | ORAL_TABLET | Freq: Four times a day (QID) | ORAL | Status: DC | PRN

## 2022-11-26 MED ORDER — ASPIRIN 81 MG PO CHEW
324.0000 mg | CHEWABLE_TABLET | Freq: Once | ORAL | Status: AC
  Administered 2022-11-26: 324 mg via ORAL
  Filled 2022-11-26: qty 4

## 2022-11-26 MED ORDER — DILTIAZEM HCL 25 MG/5ML IV SOLN: 10.0000 mg | Freq: Four times a day (QID) | INTRAVENOUS | Status: AC | PRN

## 2022-11-26 MED ORDER — SENNOSIDES-DOCUSATE SODIUM 8.6-50 MG PO TABS: 1.0000 | ORAL_TABLET | Freq: Every evening | ORAL | Status: DC | PRN

## 2022-11-26 MED ORDER — SODIUM CHLORIDE 0.9 % IV SOLN: Freq: Once | INTRAVENOUS | Status: AC

## 2022-11-26 NOTE — Assessment & Plan Note (Addendum)
Concerning for ACS given patient high risk: Hypertension, PAD, persistent tobacco use Nitroglycerin 1 inch topical every 6 hours as needed for chest pain, 2 days ordered Cardiology has been consulted by myself, Dr. Mariah Milling via secure chat and epic order Complete echo ordered Per cardiology, plan for left heart cath Monday, 11/29/2022 Continue heparin GTT per pharmacy Admit to PCU, inpatient

## 2022-11-26 NOTE — Assessment & Plan Note (Addendum)
Coreg 12.5 mg p.o. twice daily resume medication, however I did not resumed on admission as patient received diltiazem with EDP AM team to resume Coreg when the benefits outweigh the risk

## 2022-11-26 NOTE — ED Notes (Signed)
Purewick placed on pt. 

## 2022-11-26 NOTE — Consult Note (Signed)
Cardiology Consultation   Patient ID: TIMBERLYN PICKFORD MRN: [id number]; DOB: [birth year]  Admit date: 11/26/2022 Date of Consult: 11/26/2022  PCP:  Alease Medina, MD   [facility] Providers Cardiologist: [facility] Patient requesting consult: Amy Cox Reason for consult: Non-STEMI, atrial fibrillation with RVR  Patient Profile:   SUAD AUTREY is a 62 y.o. female with a hx of smoking, COPD, hypertension, diastolic CHF, presenting with shortness of breath, chest tightness acute onset yesterday evening persisting on and off since that time getting worse afternoon of November 26 2022, diagnosed with atrial fibrillation with RVR, non-STEMI  History of Present Illness:   Ms. Majkowski was previously seen in our clinic 2019 with history of PAD, subclavian stenosis causing left arm pain, COPD, smoking, aortoiliac atherosclerosis, malignant hypertension, prior PCI/intervention to the left subclavian October 17, 2017 Lost to follow-up after visit April 2019 Has been followed since that time at [facility] Notes indicating she was having chest pain, she  underwent PET stress test January 2023 which was low risk, no significant ischemia Echo January 2023 EF 65 to 70% No additional cardiac workup since that time  Reports developing acute onset chest pain tachycardia symptoms yesterday early evening Was bending over pulling close out of the dryer when symptoms presented Symptoms off and on all night long, somewhat better in the morning but getting worse this afternoon April 19, presented to the emergency room  On arrival to the ER noted to be in atrial fibrillation with RVR rate 180 bpm Initial troponin 1400 Given several doses of diltiazem IV push, converting to normal sinus rhythm in the emergency room Started on heparin infusion Currently resting comfortably, denies chest pain  Past Medical History:  Diagnosis Date   Aorto-iliac atherosclerosis 01/06/2016   Asthma    COPD (chronic obstructive  pulmonary disease)    Hypertension    Osteoporosis    Tobacco abuse 01/06/2016   Vitamin D deficiency 01/06/2016    Past Surgical History:  Procedure Laterality Date   ANKLE FRACTURE SURGERY Left 11/07/2009   left ankle hardware removal     left ankle surgery     LOWER EXTREMITY ANGIOGRAPHY Left 10/17/2017   Procedure: LOWER EXTREMITY ANGIOGRAPHY;  Surgeon: Annice Needy, MD;  Location: [facility] INVASIVE CV LAB;  Service: Cardiovascular;  Laterality: Left;   TUBAL LIGATION       Home Medications:  Prior to Admission medications   Medication Sig Start Date End Date Taking? Authorizing Provider  acetaminophen (TYLENOL) 500 MG tablet Take 500-1,000 mg by mouth every 6 (six) hours as needed for mild pain or moderate pain.   Yes [provider]  ALPRAZolam Prudy Feeler) 0.5 MG tablet Take 0.5 mg by mouth 4 (four) times daily as needed for anxiety.   Yes [provider]  carvedilol (COREG) 25 MG tablet Take 12.5 mg by mouth 2 (two) times daily with a meal.   Yes [provider]  omeprazole (PRILOSEC) 40 MG capsule Take 40 mg by mouth daily.   Yes [provider]  oxyCODONE-acetaminophen (PERCOCET) 7.5-325 MG tablet Take 1 tablet by mouth every 8 (eight) hours as needed for severe pain.   Yes [provider]    Inpatient Medications: Scheduled Meds:  [START ON 11/27/2022] aspirin EC  81 mg Oral Daily   atorvastatin  10 mg Oral QHS   pantoprazole  80 mg Oral Daily   Continuous Infusions:  heparin 800 Units/hr (11/26/22 1643)   PRN Meds: acetaminophen **OR** acetaminophen, ALPRAZolam, diltiazem,  nicotine, nitroGLYCERIN, ondansetron **OR** ondansetron (ZOFRAN) IV, senna-docusate  Allergies:    Allergies  Allergen Reactions   Vicodin [Hydrocodone-Acetaminophen] Nausea And Vomiting   Vicodin [Hydrocodone-Acetaminophen]     Abd pain, N/V   Hct [Hydrochlorothiazide] Palpitations   Tramadol Palpitations    Social History:   Social History    Socioeconomic History   Marital status: Divorced    Spouse name: Not on file   Number of children: Not on file   Years of education: Not on file   Highest education level: GED or equivalent  Occupational History   Occupation: unemployed  Tobacco Use   Smoking status: Every Day    Packs/day: 1.00    Years: 30.00    Additional pack years: 0.00    Total pack years: 30.00    Types: Cigarettes   Smokeless tobacco: Never  Vaping Use   Vaping Use: Never used  Substance and Sexual Activity   Alcohol use: No    Alcohol/week: 0.0 standard drinks of alcohol   Drug use: No   Sexual activity: Never  Other Topics Concern   Not on file  Social History Narrative   Food stamps for both her and friend she has been with for 23 years. Some months run low, but always make it.    Social Determinants of Health   Financial Resource Strain: Medium Risk (06/13/2018)   Overall Financial Resource Strain (CARDIA)    Difficulty of Paying Living Expenses: Somewhat hard  Food Insecurity: No Food Insecurity (01/10/2018)   Hunger Vital Sign    Worried About Running Out of Food in the Last Year: Never true    Ran Out of Food in the Last Year: Never true  Transportation Needs: No Transportation Needs (01/10/2018)   PRAPARE - Administrator, Civil Service (Medical): No    Lack of Transportation (Non-Medical): No  Physical Activity: Sufficiently Active (01/10/2018)   Exercise Vital Sign    Days of Exercise per Week: 6 days    Minutes of Exercise per Session: 40 min  Stress: Stress Concern Present (01/10/2018)   [employer] of Occupational Health - Occupational Stress Questionnaire    Feeling of Stress : To some extent  Social Connections: Moderately Integrated (01/10/2018)   Social Connection and Isolation Panel [NHANES]    Frequency of Communication with Friends and Family: More than three times a week    Frequency of Social Gatherings with Friends and Family: Three times a week    Attends  Religious Services: 1 to 4 times per year    Active Member of Clubs or Organizations: No    Attends Banker Meetings: Never    Marital Status: Living with partner  Intimate Partner Violence: Not At Risk (01/10/2018)   Humiliation, Afraid, Rape, and Kick questionnaire    Fear of Current or Ex-Partner: No    Emotionally Abused: No    Physically Abused: No    Sexually Abused: No    Family History:    Family History  Problem Relation Age of Onset   COPD Mother    Heart failure Mother    COPD Father    Heart failure Father      ROS:  Please see the history of present illness.  Review of Systems  Constitutional: Negative.   HENT: Negative.    Respiratory: Negative.    Cardiovascular: Negative.   Gastrointestinal: Negative.   Musculoskeletal: Negative.   Neurological: Negative.   Psychiatric/Behavioral: Negative.    All other systems  reviewed and are negative.   Physical Exam/Data:   Vitals:   11/26/22 1715 11/26/22 1730 11/26/22 1745 11/26/22 1815  BP: 120/86 115/83 97/76   Pulse:    67  Resp: 19 16 19 15   Temp:      SpO2:    99%  Weight:      Height:       No intake or output data in the 24 hours ending 11/26/22 1853    11/26/2022    4:24 PM 10/11/2018    9:07 AM 10/08/2018    4:46 PM  Last 3 Weights  Weight (lbs) 169 lb 158 lb 8 oz 158 lb  Weight (kg) 76.658 kg 71.895 kg 71.668 kg     Body mass index is 30.91 kg/m.  General:  Well nourished, well developed, in no acute distress HEENT: normal Neck: no JVD Vascular: No carotid bruits; Distal pulses 2+ bilaterally Cardiac:  normal S1, S2; RRR; no murmur  Lungs:  clear to auscultation bilaterally, no wheezing, rhonchi or rales  Abd: soft, nontender, no hepatomegaly  Ext: no edema Musculoskeletal:  No deformities, BUE and BLE strength normal and equal Skin: warm and dry  Neuro:  CNs 2-12 intact, no focal abnormalities noted Psych:  Normal affect   EKG:  The EKG was personally reviewed and  demonstrates:   Atrial fibrillation rate 166 bpm, diffuse ST abnormality  Telemetry:  Telemetry was personally reviewed and demonstrates:   Normal sinus rhythm  Relevant CV Studies: Echocardiogram pending  Laboratory Data:  High Sensitivity Troponin:   Recent Labs  Lab 11/26/22 1526 11/26/22 1815  TROPONINIHS 1,402* 1,473*     Chemistry Recent Labs  Lab 11/26/22 1526  NA 131*  K 3.6  CL 100  CO2 21*  GLUCOSE 137*  BUN 13  CREATININE 0.91  CALCIUM 9.8  MG 1.8  GFRNONAA >60  ANIONGAP 10    No results for input(s): "PROT", "ALBUMIN", "AST", "ALT", "ALKPHOS", "BILITOT" in the last 168 hours. Lipids No results for input(s): "CHOL", "TRIG", "HDL", "LABVLDL", "LDLCALC", "CHOLHDL" in the last 168 hours.  Hematology Recent Labs  Lab 11/26/22 1526  WBC 13.8*  RBC 4.93  HGB 15.2*  HCT 46.5*  MCV 94.3  MCH 30.8  MCHC 32.7  RDW 13.4  PLT 374   Thyroid  Recent Labs  Lab 11/26/22 1526  TSH 1.663  FREET4 0.73    BNPNo results for input(s): "BNP", "PROBNP" in the last 168 hours.  DDimer  Recent Labs  Lab 11/26/22 1526  DDIMER 0.40     Radiology/Studies:  DG Chest Port 1 View  Result Date: 11/26/2022 CLINICAL DATA:  Chest pain EXAM: PORTABLE CHEST 1 VIEW COMPARISON:  X-ray 02/26/2017.  CT 01/30/2018 FINDINGS: Overlapping cardiac leads and defibrillator pads. Enlarged cardiopericardial silhouette with some vascular congestion. No consolidation, pneumothorax or effusion. IMPRESSION: Enlarged cardiopericardial silhouette with some vascular congestion. Electronically Signed   By: Karen Kays M.D.   On: 11/26/2022 15:57     Assessment and Plan:   Atrial fibrillation with RVR By her history, onset late afternoon early evening while pulling close out of the dryer with acute onset of symptoms Converting to normal sinus rhythm in the emergency room on diltiazem push -Currently with low blood pressure limiting initiation of oral diltiazem or beta-blockers -As blood  pressure tolerates will reinitiate her outpatient carvedilol  If blood pressure allows, could consider diltiazem  -Will recommend after heparin infusion, will transition to Eliquis at discharge  2.  Non-STEMI Long history of  smoking, high risk for underlying coronary artery disease Elevated troponin out of proportion to her tachycardia ST depressions concerning for ischemia in the setting of tachycardia/atrial fibrillation -Would recommend heparin through the weekend, cardiac catheterization on Monday given high risk for coronary ischemia Echocardiogram pending  3.  Smoking/COPD We have encouraged her to continue to work on weaning her cigarettes and smoking cessation. She will continue to work on this and does not want any assistance with chantix.    4.  Essential hypertension As outpatient takes carvedilol 12.5 twice daily  5.  Hyperlipidemia Continue Lipitor  6.  PAD History of PCI of subclavian stenosis several years ago Smoking cessation recommended Aspirin statin    Total encounter time more than 80 minutes  Greater than 50% was spent in counseling and coordination of care with the patient  For questions or updates, please contact [facility] Please consult [email or web address] for contact info under    Signed, Julien Nordmann, MD  11/26/2022 6:53 PM

## 2022-11-26 NOTE — Assessment & Plan Note (Addendum)
New diagnosis of atrial fibrillation, found to be in RVR Responded to diltiazem 15 mg IV one-time dose per EDP Diltiazem 10 mg IV every 6 hours as needed for heart rate greater than 120, 12 hours coverage ordered

## 2022-11-26 NOTE — ED Notes (Signed)
Pt placed on Zoll monitor. MD/RN at bedside

## 2022-11-26 NOTE — Hospital Course (Signed)
[name] is a 62 year old female with history of persistent tobacco use, nicotine dependence, hypertension with medication resistant hypertension, hyperlipidemia, COPD, heart failure preserved ejection fraction, who presents emergency department for chief concerns of shortness of breath, dizziness, left arm numbness.  Vitals show temperature 97.8, respiration rate of 17, heart rate 65, pressure 119/84, SpO2 93% on room air.  Serum sodium is 131, potassium 2.6, chloride 100, bicarb 21, BUN of 13, serum creatinine 0.91, EGFR greater than 60, nonfasting blood glucose 137, WBC 13.8, hemoglobin 15.2, platelets of 374.  High sensitive troponin is 1402.  ED treatment: Aspirin 324 mg p.o. one-time dose, diltiazem 50 mg IV one-time dose, heparin GTT per pharmacy ordered.

## 2022-11-26 NOTE — Assessment & Plan Note (Addendum)
Patient is on aspirin 81 mg, this has been resumed Patient smokes cigarettes Counseling given

## 2022-11-26 NOTE — Assessment & Plan Note (Addendum)
As needed nicotine patch ordered Patient is not ready to quit

## 2022-11-26 NOTE — ED Provider Notes (Signed)
[facility] Provider Note    Event Date/Time   First MD Initiated Contact with Patient 11/26/22 1516     (approximate)   History   Chest Pain   HPI  Sheila [name] is a 62 y.o. female   Past medical history of tobacco use, asthma, COPD, hypertension who presents to the emergency department with lightheadedness, shortness of breath, chest discomfort since last night found to be tachycardic to the 160s to 220s but normotensive.  She has had no recent illnesses, alcohol use drug use.  She has been compliant with her medications had no changes recently.  She has had no dysrhythmias, A-fib, not on blood thinners.  On review of systems when asked if she is ever had symptoms like this before she has had intermittent chest discomfort in the past nonexertional, but never sustained like this before.  Independent Historian contributed to assessment above: Her family members at bedside to corroborate information given above  External Medical Documents Reviewed: Her nurse practitioner dermatology note dated 11/07/2018 which addresses her hypertension, and review of past medical history.      Physical Exam   Triage Vital Signs: ED Triage Vitals  Enc Vitals Group     BP 11/26/22 1512 (!) 135/91     Pulse Rate 11/26/22 1512 (!) 160     Resp 11/26/22 1512 18     Temp 11/26/22 1512 97.8 F (36.6 C)     Temp src --      SpO2 11/26/22 1512 95 %     Weight --      Height --      Head Circumference --      Peak Flow --      Pain Score 11/26/22 1510 7     Pain Loc --      Pain Edu? --      Excl. in GC? --     Most recent vital signs: Vitals:   11/26/22 1545 11/26/22 1600  BP: (!) 120/107 119/84  Pulse: 74 65  Resp: (!) 21 17  Temp:    SpO2: 94% 93%    General: Awake, no distress.  CV:  Good peripheral perfusion.  Resp:  Normal effort.  Abd:  No distention.  Other:  Awake alert comfortable euvolemic appearing, normotensive with an irregular heart  rate ranging from 160-220    ED Results / Procedures / Treatments   Labs (all labs ordered are listed, but only abnormal results are displayed) Labs Reviewed  BASIC METABOLIC PANEL - Abnormal; Notable for the following components:      Result Value   Sodium 131 (*)    CO2 21 (*)    Glucose, Bld 137 (*)    All other components within normal limits  CBC - Abnormal; Notable for the following components:   WBC 13.8 (*)    Hemoglobin 15.2 (*)    HCT 46.5 (*)    All other components within normal limits  TROPONIN I (HIGH SENSITIVITY) - Abnormal; Notable for the following components:   Troponin I (High Sensitivity) 1,402 (*)    All other components within normal limits  D-DIMER, QUANTITATIVE  MAGNESIUM  TSH  T4, FREE  URINALYSIS, W/ REFLEX TO CULTURE (INFECTION SUSPECTED)  PROTIME-INR  APTT     I ordered and reviewed the above labs they are notable for initial troponin of 1400  EKG  ED ECG REPORT I, Pilar Jarvis, the attending physician, personally viewed and interpreted this ECG.   Date:  11/26/2022  EKG Time: 1513  Rate: 166  Rhythm: AF RVR  Axis: nl  ST&T Change: no stemi    RADIOLOGY I independently reviewed and interpreted chest x-ray and see no obvious focality   PROCEDURES:  Critical Care performed: Yes, see critical care procedure note(s)  .Critical Care  Performed by: Pilar Jarvis, MD Authorized by: Pilar Jarvis, MD   Critical care provider statement:    Critical care time (minutes):  30   Critical care was time spent personally by me on the following activities:  Development of treatment plan with patient or surrogate, discussions with consultants, evaluation of patient's response to treatment, examination of patient, ordering and review of laboratory studies, ordering and review of radiographic studies, ordering and performing treatments and interventions, pulse oximetry, re-evaluation of patient's condition and review of old charts    MEDICATIONS  ORDERED IN ED: Medications  aspirin chewable tablet 324 mg (has no administration in time range)  diltiazem (CARDIZEM) injection 15 mg (15 mg Intravenous Given 11/26/22 1527)  0.9 %  sodium chloride infusion ( Intravenous New Bag/Given 11/26/22 1527)    External physician / consultants:  I spoke with hospitalist for admission and regarding care plan for this patient.   IMPRESSION / MDM / ASSESSMENT AND PLAN / ED COURSE  I reviewed the triage vital signs and the nursing notes.                                Patient's presentation is most consistent with acute presentation with potential threat to life or bodily function.  Differential diagnosis includes, but is not limited to, atrial fibrillation, SVT, electrolyte disturbance, dehydration, infection, ACS, PE   The patient is on the cardiac monitor to evaluate for evidence of arrhythmia and/or significant heart rate changes.  MDM: This is a patient with supraventricular tachycardia that appears to be atrial fibrillation and RVR, this is her first occurrence.  Most likely started last night though with vague symptoms preceding I am not certain enough that this first occurrence within 48 hours to justify a cardioversion so we opted for rate control with diltiazem 15 mg with very good effect, now sinus rhythm in the 70s remains normotensive and symptoms much improved.  She did have chest pain her initial troponin came back at 1400, EKG without STEMI.  I started heparin for her.  Although the troponin increase may be due to her heart rate, she does have chest pain along with an elevated troponin-another justification for her heparin would be her atrial fibrillation and a CHA2DS2-VASc score of 3 points.  The remainder of her labs including thyroid studies, electrolytes, D-dimer are pending.        FINAL CLINICAL IMPRESSION(S) / ED DIAGNOSES   Final diagnoses:  Atrial fibrillation, new onset  Atrial fibrillation with rapid ventricular  response  Nonspecific chest pain  Troponin level elevated     Rx / DC Orders   ED Discharge Orders     None        Note:  This document was prepared using Dragon voice recognition software and may include unintentional dictation errors.    Pilar Jarvis, MD 11/26/22 [id number]

## 2022-11-26 NOTE — ED Triage Notes (Signed)
Pt to ED via POV from home. Pt reports left sided CP that started last pm and has been intermittent. Pt states SOB, dizziness and left arm numbness. Pt with COPD and Htn. Pt denies blood thinners. Pt is everyday smoker.

## 2022-11-26 NOTE — Assessment & Plan Note (Signed)
Suspect secondary to chest pain, elevated troponin, concerning for unstable angina

## 2022-11-26 NOTE — H&P (Addendum)
History and Physical   Sheila [name] ZOX:[id number] DOB: 01/18/1961 DOA: 11/26/2022  PCP: Alease Medina, MD  Outpatient Specialists: Dr. Alla German, [facility] Cardiology Patient coming from: Home via POV  I have personally briefly reviewed patient's old medical records in [facility] EMR.  Chief Concern: Chest pain, shortness of breath  HPI: Sheila [name] is a 62 year old female with history of persistent tobacco use, nicotine dependence, hypertension with medication resistant hypertension, hyperlipidemia, COPD, heart failure preserved ejection fraction, who presents emergency department for chief concerns of shortness of breath, dizziness, left arm numbness.  Vitals show temperature 97.8, respiration rate of 17, heart rate 65, pressure 119/84, SpO2 93% on room air.  Serum sodium is 131, potassium 2.6, chloride 100, bicarb 21, BUN of 13, serum creatinine 0.91, EGFR greater than 60, nonfasting blood glucose 137, WBC 13.8, hemoglobin 15.2, platelets of 374.  High sensitive troponin is 1402.  ED treatment: Aspirin 324 mg p.o. one-time dose, diltiazem 50 mg IV one-time dose, heparin GTT per pharmacy ordered. --------------------- At bedside, patient was able to tell me her name, age, current calendar year, current location.  She reports being in a little bit of pain however the chest pain has improved from last night with the onset of chest pain.  She reports that last night she started having chest pain while sitting on the toilet. She further endorses shortness of breath and left arm pain. She had 3-4 episodes of watery brown diarrhea that has since resolved.  She reports the shortness of breath and chest pain is worse with exertion.  She reports she has never experienced this before.  She denies trauma to her person.  She denies nausea, vomiting, swelling of her lower extremities, unintentional weight gain/loss.  She reports that she infrequently has shortness of breath as she has COPD  however over the last month she has been having worsening shortness of breath with activity, exertion, walking.  Social history: She lives at home with her boyfriend.  She currently smokes about 1 pack/day and is not ready to quite quit yet.  She denies EtOH, recreational drug use.  She is retired and formally worked in Clinical biochemist.  ROS: Constitutional: no weight change, no fever ENT/Mouth: no sore throat, no rhinorrhea Eyes: no eye pain, no vision changes Cardiovascular: + chest pain, + dyspnea,  no edema, no palpitations Respiratory: no cough, no sputum, no wheezing Gastrointestinal: no nausea, no vomiting, no diarrhea, no constipation Genitourinary: no urinary incontinence, no dysuria, no hematuria Musculoskeletal: no arthralgias, no myalgias Skin: no skin lesions, no pruritus, Neuro: + weakness, no loss of consciousness, no syncope Psych: no anxiety, no depression, + decrease appetite Heme/Lymph: no bruising, no bleeding  ED Course: Discussed with emergency medicine provider, patient requiring hospitalization for chief concerns of chest pain and elevated troponin.  Assessment/Plan  Principal Problem:   NSTEMI (non-ST elevated myocardial infarction) Active Problems:   Essential hypertension   COPD (chronic obstructive pulmonary disease)   Tobacco abuse   GERD (gastroesophageal reflux disease)   Carotid stenosis   PAD (peripheral artery disease)   Cerebellar ataxia in diseases classified elsewhere   Hyperlipidemia   Dyspnea on effort   Paroxysmal atrial fibrillation with RVR   Assessment and Plan:  * NSTEMI (non-ST elevated myocardial infarction) Concerning for ACS given patient high risk: Hypertension, PAD, persistent tobacco use Nitroglycerin 1 inch topical every 6 hours as needed for chest pain, 2 days ordered Cardiology has been consulted by myself, Dr. Mariah Milling via secure chat  and epic order Complete echo ordered Per cardiology, plan for left heart cath Monday,  11/29/2022 Continue heparin GTT per pharmacy Admit to PCU, inpatient  Paroxysmal atrial fibrillation with RVR New diagnosis of atrial fibrillation, found to be in RVR Responded to diltiazem 15 mg IV one-time dose per EDP Diltiazem 10 mg IV every 6 hours as needed for heart rate greater than 120, 12 hours coverage ordered  Dyspnea on effort Suspect secondary to chest pain, elevated troponin, concerning for unstable angina  Hyperlipidemia Atorvastatin 10 mg nightly resumed  PAD (peripheral artery disease) Patient is on aspirin 81 mg, this has been resumed Patient smokes cigarettes Counseling given  Tobacco abuse As needed nicotine patch ordered Patient is not ready to quit  Essential hypertension Coreg 12.5 mg p.o. twice daily resume medication, however I did not resumed on admission as patient received diltiazem with EDP AM team to resume Coreg when the benefits outweigh the risk  Chart reviewed.   DVT prophylaxis: Heparin per pharmacy's Code Status: Full code Diet: Heart healthy; n.p.o. after midnight Family Communication: A phone call was offered, patient declined stating that everybody already knows she is in the hospital. Disposition Plan: Pending clinical course, pending cardiology evaluation Consults called: Cardiology Admission status: PCU, inpatient  Past Medical History:  Diagnosis Date   Aorto-iliac atherosclerosis 01/06/2016   Asthma    COPD (chronic obstructive pulmonary disease)    Hypertension    Osteoporosis    Tobacco abuse 01/06/2016   Vitamin D deficiency 01/06/2016   Past Surgical History:  Procedure Laterality Date   ANKLE FRACTURE SURGERY Left 11/07/2009   left ankle hardware removal     left ankle surgery     LOWER EXTREMITY ANGIOGRAPHY Left 10/17/2017   Procedure: LOWER EXTREMITY ANGIOGRAPHY;  Surgeon: Annice Needy, MD;  Location: [facility] INVASIVE CV LAB;  Service: Cardiovascular;  Laterality: Left;   TUBAL LIGATION     Social History:  reports  that she has been smoking cigarettes. She has a 30.00 pack-year smoking history. She has never used smokeless tobacco. She reports that she does not drink alcohol and does not use drugs.  Allergies  Allergen Reactions   Vicodin [Hydrocodone-Acetaminophen] Nausea And Vomiting   Vicodin [Hydrocodone-Acetaminophen]     Abd pain, N/V   Hct [Hydrochlorothiazide] Palpitations   Tramadol Palpitations   Family History  Problem Relation Age of Onset   COPD Mother    Heart failure Mother    COPD Father    Heart failure Father    Family history: Family history reviewed and pertinent for heart failure in mother and father.  Prior to Admission medications   Medication Sig Start Date End Date Taking? Authorizing Provider  acetaminophen (TYLENOL) 500 MG tablet Take 500-1,000 mg by mouth every 6 (six) hours as needed for mild pain or moderate pain.   Yes [provider]  ALPRAZolam Prudy Feeler) 0.5 MG tablet Take 0.5 mg by mouth 4 (four) times daily as needed for anxiety.   Yes [provider]  carvedilol (COREG) 25 MG tablet Take 12.5 mg by mouth 2 (two) times daily with a meal.   Yes [provider]  omeprazole (PRILOSEC) 40 MG capsule Take 40 mg by mouth daily.   Yes [provider]  oxyCODONE-acetaminophen (PERCOCET) 7.5-325 MG tablet Take 1 tablet by mouth every 8 (eight) hours as needed for severe pain.   Yes [provider]   Physical Exam: Vitals:   11/26/22 1715 11/26/22 1730 11/26/22 1745 11/26/22 1815  BP:  120/86 115/83 97/76   Pulse:    67  Resp: Temp:      SpO2:    99%  Weight:      Height:       Constitutional: appears age-appropriate, mild discomfort Eyes: PERRL, lids and conjunctivae normal ENMT: Mucous membranes are moist. Posterior pharynx clear of any exudate or lesions. Age-appropriate dentition. Hearing appropriate Neck: normal, supple, no masses, no thyromegaly Respiratory: clear to auscultation bilaterally, no  wheezing, no crackles. Normal respiratory effort. No accessory muscle use.  Cardiovascular: Regular rate and rhythm, no murmurs / rubs / gallops. No extremity edema. 2+ pedal pulses. No carotid bruits.  Abdomen: no tenderness, no masses palpated, no hepatosplenomegaly. Bowel sounds positive.  Musculoskeletal: no clubbing / cyanosis. No joint deformity upper and lower extremities. Good ROM, no contractures, no atrophy. Normal muscle tone.  Skin: no rashes, lesions, ulcers. No induration Neurologic: Sensation intact. Strength 5/5 in all 4.  Psychiatric: Normal judgment and insight. Alert and oriented x 3. Normal mood.   EKG: independently reviewed, showing atrial fibrillation with rate of 166, QTc 385 and post diltiazem 15 mg IV one-time dose, sinus rhythm with rate of 74, QTc 451.  Chest x-ray on Admission: I personally reviewed and I agree with radiologist reading as below.  DG Chest Port 1 View  Result Date: 11/26/2022 CLINICAL DATA:  Chest pain EXAM: PORTABLE CHEST 1 VIEW COMPARISON:  X-ray 02/26/2017.  CT 01/30/2018 FINDINGS: Overlapping cardiac leads and defibrillator pads. Enlarged cardiopericardial silhouette with some vascular congestion. No consolidation, pneumothorax or effusion. IMPRESSION: Enlarged cardiopericardial silhouette with some vascular congestion. Electronically Signed   By: Karen Kays M.D.   On: 11/26/2022 15:57    Labs on Admission: I have personally reviewed following labs  CBC: Recent Labs  Lab 11/26/22 1526  WBC 13.8*  HGB 15.2*  HCT 46.5*  MCV 94.3  PLT 374   Basic Metabolic Panel: Recent Labs  Lab 11/26/22 1526  NA 131*  K 3.6  CL 100  CO2 21*  GLUCOSE 137*  BUN 13  CREATININE 0.91  CALCIUM 9.8  MG 1.8   GFR: Estimated Creatinine Clearance: 61.4 mL/min (by C-G formula based on SCr of 0.91 mg/dL).  Urine analysis:    Component Value Date/Time   COLORURINE COLORLESS (A) 11/26/2022 1537   APPEARANCEUR CLEAR (A) 11/26/2022 1537    APPEARANCEUR Clear 10/05/2018 1229   LABSPEC 1.002 (L) 11/26/2022 1537   LABSPEC 1.002 12/04/2013 1330   PHURINE 7.0 11/26/2022 1537   GLUCOSEU NEGATIVE 11/26/2022 1537   GLUCOSEU Negative 12/04/2013 1330   HGBUR MODERATE (A) 11/26/2022 1537   BILIRUBINUR NEGATIVE 11/26/2022 1537   BILIRUBINUR Negative 10/05/2018 1229   BILIRUBINUR Negative 12/04/2013 1330   KETONESUR NEGATIVE 11/26/2022 1537   PROTEINUR NEGATIVE 11/26/2022 1537   UROBILINOGEN 0.2 05/08/2010 1804   NITRITE NEGATIVE 11/26/2022 1537   LEUKOCYTESUR NEGATIVE 11/26/2022 1537   LEUKOCYTESUR Negative 12/04/2013 1330   This document was prepared using Dragon Voice Recognition software and may include unintentional dictation errors.  Dr. Sedalia Muta [facility]  If 7PM-7AM, please contact overnight-coverage provider If 7AM-7PM, please contact day attending provider [email or web address]  11/26/2022, 6:56 PM

## 2022-11-26 NOTE — Consult Note (Signed)
ANTICOAGULATION CONSULT NOTE  Pharmacy Consult for heparin infusion Indication: ACS/STEMI  Allergies  Allergen Reactions   Vicodin [Hydrocodone-Acetaminophen] Nausea And Vomiting   Vicodin [Hydrocodone-Acetaminophen]     Abd pain, N/V   Hct [Hydrochlorothiazide] Palpitations   Tramadol Palpitations    Patient Measurements:   Heparin Dosing Weight: 66.8 kg  Vital Signs: Temp: 97.8 F (36.6 C) (04/19 1512) BP: 119/84 (04/19 1600) Pulse Rate: 65 (04/19 1600)  Labs: Recent Labs    11/26/22 1526  HGB 15.2*  HCT 46.5*  PLT 374  CREATININE 0.91  TROPONINIHS 1,402*    CrCl cannot be calculated (Unknown ideal weight.).   Medical History: Past Medical History:  Diagnosis Date   Aorto-iliac atherosclerosis 01/06/2016   Asthma    COPD (chronic obstructive pulmonary disease)    Hypertension    Osteoporosis    Tobacco abuse 01/06/2016   Vitamin D deficiency 01/06/2016    Medications:  No PTA anticoagulation per pharmacist review  Assessment: 62 yo female presented with shortness of breath, light headedness, and chest discomfort.  In the ED troponin found to be elevated.  Pharmacy consulted to start heparin infusion.  Baseline labs: hgb 15.2, hct 46.5, plt = 374, aPTT pending, INR pending  Goal of Therapy:  Heparin level 0.3-0.7 units/ml Monitor platelets by anticoagulation protocol: Yes   Plan:  Give 4000 units bolus x 1 Start heparin infusion at 800 units/hr Check anti-Xa level in 6 hours and daily while on heparin Continue to monitor H&H and platelets  Barrie Folk, PharmD 11/26/2022,4:21 PM

## 2022-11-26 NOTE — Assessment & Plan Note (Signed)
-   Atorvastatin 10 mg nightly resumed °

## 2022-11-27 DIAGNOSIS — I214 Non-ST elevation (NSTEMI) myocardial infarction: Secondary | ICD-10-CM | POA: Diagnosis not present

## 2022-11-27 DIAGNOSIS — I4891 Unspecified atrial fibrillation: Principal | ICD-10-CM

## 2022-11-27 DIAGNOSIS — I480 Paroxysmal atrial fibrillation: Secondary | ICD-10-CM

## 2022-11-27 LAB — BASIC METABOLIC PANEL
Anion gap: 6 (ref 5–15)
BUN: 11 mg/dL (ref 8–23)
CO2: 22 mmol/L (ref 22–32)
Calcium: 9.7 mg/dL (ref 8.9–10.3)
Chloride: 112 mmol/L — ABNORMAL HIGH (ref 98–111)
Creatinine, Ser: 0.73 mg/dL (ref 0.44–1.00)
GFR, Estimated: >60 mL/min (ref >60)
Glucose, Bld: 92 mg/dL (ref 70–99)
Potassium: 3.7 mmol/L (ref 3.5–5.1)
Sodium: 140 mmol/L (ref 135–145)

## 2022-11-27 LAB — HEMOGLOBIN A1C
Hgb A1c MFr Bld: 5.9 % — ABNORMAL HIGH (ref 4.8–5.6)
Mean Plasma Glucose: 122.63 mg/dL

## 2022-11-27 LAB — CBC
HCT: 42.5 % (ref 36.0–46.0)
Hemoglobin: 13.7 g/dL (ref 12.0–15.0)
MCH: 30.9 pg (ref 26.0–34.0)
MCHC: 32.2 g/dL (ref 30.0–36.0)
MCV: 95.7 fL (ref 80.0–100.0)
Platelets: 348 10*3/uL (ref 150–400)
RBC: 4.44 MIL/uL (ref 3.87–5.11)
RDW: 13.5 % (ref 11.5–15.5)
WBC: 12.7 10*3/uL — ABNORMAL HIGH (ref 4.0–10.5)
nRBC: 0 % (ref 0.0–0.2)

## 2022-11-27 LAB — HEPARIN LEVEL (UNFRACTIONATED)
Heparin Unfractionated: 0.15 IU/mL — ABNORMAL LOW (ref 0.30–0.70)
Heparin Unfractionated: 0.31 IU/mL (ref 0.30–0.70)
Heparin Unfractionated: 0.15 IU/mL — ABNORMAL LOW (ref 0.30–0.70)

## 2022-11-27 LAB — LIPID PANEL
Cholesterol: 193 mg/dL (ref 0–200)
HDL: 39 mg/dL — ABNORMAL LOW (ref >40)
LDL Cholesterol: 116 mg/dL — ABNORMAL HIGH (ref 0–99)
Total CHOL/HDL Ratio: 4.9 RATIO
Triglycerides: 188 mg/dL — ABNORMAL HIGH (ref <150)
VLDL: 38 mg/dL (ref 0–40)

## 2022-11-27 LAB — TROPONIN I (HIGH SENSITIVITY): Troponin I (High Sensitivity): 1086 ng/L (ref <18)

## 2022-11-27 MED ORDER — CARVEDILOL 3.125 MG PO TABS
3.1250 mg | ORAL_TABLET | Freq: Two times a day (BID) | ORAL | Status: DC
  Administered 2022-11-27 – 2022-11-28 (×3): 3.125 mg via ORAL
  Filled 2022-11-27 (×4): qty 1

## 2022-11-27 MED ORDER — HEPARIN BOLUS VIA INFUSION
1000.0000 [IU] | Freq: Once | INTRAVENOUS | Status: AC
  Administered 2022-11-27: 1000 [IU] via INTRAVENOUS
  Filled 2022-11-27: qty 1000

## 2022-11-27 MED ORDER — HEPARIN BOLUS VIA INFUSION
2000.0000 [IU] | Freq: Once | INTRAVENOUS | Status: AC
  Administered 2022-11-27: 2000 [IU] via INTRAVENOUS
  Filled 2022-11-27: qty 2000

## 2022-11-27 NOTE — Progress Notes (Signed)
[facility]  - [place] [facility]   PATIENT NAME: Sheila [name]    MR#:  [id number]  DATE OF BIRTH:  [birth year]  SUBJECTIVE:  patient seen earlier. No family at bedside. Came in with chest pain and shortness of breath. She tells me she feels a whole lot better. Denies any chest pain. She ruled in for non-STEMI.    VITALS:  Blood pressure (!) 142/115, pulse 72, temperature 97.7 F (36.5 C), temperature source Oral, resp. rate 18, height  (1.575 m), weight 76.7 kg, last menstrual period 08/09/2008, SpO2 96 %.  PHYSICAL EXAMINATION:   GENERAL:  62 y.o.-year-old patient with no acute distress.  LUNGS: Normal breath sounds bilaterally, no wheezing CARDIOVASCULAR: S1, S2 normal. No murmur   ABDOMEN: Soft, nontender, nondistended. Bowel sounds present.  EXTREMITIES: No  edema b/l.    NEUROLOGIC: nonfocal  patient is alert and awake SKIN: No obvious rash, lesion, or ulcer.   LABORATORY PANEL:  CBC Recent Labs  Lab 11/27/22 0423  WBC 12.7*  HGB 13.7  HCT 42.5  PLT 348    Chemistries  Recent Labs  Lab 11/26/22 1526 11/27/22 0423  NA 131* 140  K 3.6 3.7  CL 100 112*  CO2 21* 22  GLUCOSE 137* 92  BUN 13 11  CREATININE 0.91 0.73  CALCIUM 9.8 9.7  MG 1.8  --    RADIOLOGY:  DG Chest Port 1 View  Result Date: 11/26/2022 CLINICAL DATA:  Chest pain EXAM: PORTABLE CHEST 1 VIEW COMPARISON:  X-ray 02/26/2017.  CT 01/30/2018 FINDINGS: Overlapping cardiac leads and defibrillator pads. Enlarged cardiopericardial silhouette with some vascular congestion. No consolidation, pneumothorax or effusion. IMPRESSION: Enlarged cardiopericardial silhouette with some vascular congestion. Electronically Signed   By: Karen Kays M.D.   On: 11/26/2022 15:57    Assessment and Plan  Sheila [name] is a 62 year old female with history of persistent tobacco use, nicotine dependence, hypertension with medication resistant hypertension, hyperlipidemia, COPD, heart failure  preserved ejection fraction, who presents emergency department for chief concerns of shortness of breath, dizziness, left arm numbness.  High sensitive troponin is 1402.  ED treatment: Aspirin 324 mg p.o. one-time dose, diltiazem 50 mg IV one-time dose, heparin GTT per pharmacy ordered.  NSTEMI (non-ST elevated myocardial infarction) --Concerning for ACS given patient high risk: Hypertension, PAD, persistent tobacco use --prn nitro, heparin gtt --Cardiology has been consulted Dr. Mariah Milling --plans for [facility] on Monday -- continue Coreg and statins   Paroxysmal atrial fibrillation with RVR --New diagnosis of atrial fibrillation, found to be in RVR --Responded to diltiazem 15 mg IV one-time dose per EDP --Diltiazem 10 mg IV every 6 hours as needed for heart rate greater than 120 --on coreg --HR better --on Heparin gtt--transition to eliquis at discharge --CHADS2VASc 4   Dyspnea on effort Suspect secondary to chest pain, elevated troponin, concerning for unstable angina   Hyperlipidemia --Atorvastatin    PAD (peripheral artery disease) --Patient is on aspirin 81 mg --Patient smokes cigarettes,Counseling given   Tobacco abuse --As needed nicotine patch ordered --Patient is not ready to quit   Essential hypertension --on coreg    Procedures: Family communication :none Consults :[facility] cardiology CODE STATUS: full DVT Prophylaxis :heparin gtt Level of care: Progressive Status is: Inpatient Remains inpatient appropriate because: NSTEMI. Chat planned for monday    TOTAL TIME TAKING CARE OF THIS PATIENT: 35 minutes.  >50% time spent on counselling and coordination of care  Note: This dictation was prepared with Dragon dictation along with  smaller phrase technology. Any transcriptional errors that result from this process are unintentional.  Enedina Finner M.D    [facility]   CC: Primary care physician; Girtha Rm, Eli Phillips, MD

## 2022-11-27 NOTE — Consult Note (Signed)
ANTICOAGULATION CONSULT NOTE  Pharmacy Consult for heparin infusion Indication: ACS/STEMI and Afib  Allergies  Allergen Reactions   Vicodin [Hydrocodone-Acetaminophen] Nausea And Vomiting   Vicodin [Hydrocodone-Acetaminophen]     Abd pain, N/V   Hct [Hydrochlorothiazide] Palpitations   Tramadol Palpitations    Patient Measurements: Height:  (157.5 cm) Weight: 76.7 kg (169 lb) IBW/kg (Calculated) : 50.1 Heparin Dosing Weight: 66.8 kg  Vital Signs: Temp: 97.7 F (36.5 C) (04/20 1143) Temp Source: Oral (04/20 1143) BP: 142/115 (04/20 1430) Pulse Rate: 72 (04/20 1445)  Labs: Recent Labs    11/26/22 1526 11/26/22 1815 11/26/22 2309 11/27/22 0423 11/27/22 0800 11/27/22 1536  HGB 15.2*  --   --  13.7  --   --   HCT 46.5*  --   --  42.5  --   --   PLT 374  --   --  348  --   --   APTT  --   --  55*  --   --   --   LABPROT  --   --  12.9  --   --   --   INR  --   --  1.0  --   --   --   HEPARINUNFRC  --   --  0.20*  --  0.15* 0.31  CREATININE 0.91  --   --  0.73  --   --   TROPONINIHS 1,402* 1,473*  --  1,086*  --   --      Estimated Creatinine Clearance: 69.9 mL/min (by C-G formula based on SCr of 0.73 mg/dL).   Medical History: Past Medical History:  Diagnosis Date   Aorto-iliac atherosclerosis 01/06/2016   Asthma    COPD (chronic obstructive pulmonary disease)    Hypertension    Osteoporosis    Tobacco abuse 01/06/2016   Vitamin D deficiency 01/06/2016    Medications:  No PTA anticoagulation per pharmacist review  Assessment: 62 yo female presented with shortness of breath, light headedness, and chest discomfort.  In the ED troponin found to be elevated.  Pharmacy consulted to start heparin infusion.  Baseline labs: hgb 15.2, hct 46.5, plt = 374, aPTT 55 INR 1.0  Goal of Therapy:  Heparin level 0.3-0.7 units/ml Monitor platelets by anticoagulation protocol: Yes   0419 2309 HL 0.20, subtherapeutic  0420 0800 HL=0.15 subtherapeutic 0420 1536  HL=0.31, therapeutic x 1  Plan:  Heparin level therapeutic Continue heparin infusion at 1150 units/hr Recheck HL in 6 hrs to confirm Plan for Heart Cath on 4/22 CBC daily while on heparin  Barrie Folk, PharmD Clinical Pharmacist 11/27/2022

## 2022-11-27 NOTE — ED Notes (Signed)
Advised nurse that patient has ready bed 

## 2022-11-27 NOTE — Consult Note (Signed)
ANTICOAGULATION CONSULT NOTE  Pharmacy Consult for heparin infusion Indication: ACS/STEMI  Allergies  Allergen Reactions   Vicodin [Hydrocodone-Acetaminophen] Nausea And Vomiting   Vicodin [Hydrocodone-Acetaminophen]     Abd pain, N/V   Hct [Hydrochlorothiazide] Palpitations   Tramadol Palpitations    Patient Measurements: Height:  (157.5 cm) Weight: 76.7 kg (169 lb) IBW/kg (Calculated) : 50.1 Heparin Dosing Weight: 66.8 kg  Vital Signs: Temp: 97.8 F (36.6 C) (04/19 1936) Temp Source: Oral (04/19 1936) BP: 131/80 (04/19 2200) Pulse Rate: 56 (04/19 2200)  Labs: Recent Labs    11/26/22 1526 11/26/22 1815 11/26/22 2309  HGB 15.2*  --   --   HCT 46.5*  --   --   PLT 374  --   --   APTT  --   --  55*  LABPROT  --   --  12.9  INR  --   --  1.0  HEPARINUNFRC  --   --  0.20*  CREATININE 0.91  --   --   TROPONINIHS 1,402* 1,473*  --      Estimated Creatinine Clearance: 61.4 mL/min (by C-G formula based on SCr of 0.91 mg/dL).   Medical History: Past Medical History:  Diagnosis Date   Aorto-iliac atherosclerosis 01/06/2016   Asthma    COPD (chronic obstructive pulmonary disease)    Hypertension    Osteoporosis    Tobacco abuse 01/06/2016   Vitamin D deficiency 01/06/2016    Medications:  No PTA anticoagulation per pharmacist review  Assessment: 62 yo female presented with shortness of breath, light headedness, and chest discomfort.  In the ED troponin found to be elevated.  Pharmacy consulted to start heparin infusion.  Baseline labs: hgb 15.2, hct 46.5, plt = 374, aPTT pending, INR pending  Goal of Therapy:  Heparin level 0.3-0.7 units/ml Monitor platelets by anticoagulation protocol: Yes   0419 2309 HL 0.20, subtherapeutic x 1  Plan:  Bolus 1000 units x 1 Increase heparin infusion to 950 units/hr Recheck HL in 6 hrs after rate change CBC daily while on heparin  Otelia Sergeant, PharmD, [facility] 11/27/2022 1:31 AM

## 2022-11-27 NOTE — Consult Note (Signed)
ANTICOAGULATION CONSULT NOTE  Pharmacy Consult for heparin infusion Indication: ACS/STEMI and Afib  Allergies  Allergen Reactions   Vicodin [Hydrocodone-Acetaminophen] Nausea And Vomiting   Vicodin [Hydrocodone-Acetaminophen]     Abd pain, N/V   Hct [Hydrochlorothiazide] Palpitations   Tramadol Palpitations    Patient Measurements: Height:  (157.5 cm) Weight: 76.7 kg (169 lb) IBW/kg (Calculated) : 50.1 Heparin Dosing Weight: 66.8 kg  Vital Signs: Temp: 98 F (36.7 C) (04/20 2148) Temp Source: Oral (04/20 1601) BP: 169/96 (04/20 2148) Pulse Rate: 53 (04/20 2148)  Labs: Recent Labs    11/26/22 1526 11/26/22 1815 11/26/22 2309 11/26/22 2309 11/27/22 0423 11/27/22 0800 11/27/22 1536 11/27/22 2119  HGB 15.2*  --   --   --  13.7  --   --   --   HCT 46.5*  --   --   --  42.5  --   --   --   PLT 374  --   --   --  348  --   --   --   APTT  --   --  55*  --   --   --   --   --   LABPROT  --   --  12.9  --   --   --   --   --   INR  --   --  1.0  --   --   --   --   --   HEPARINUNFRC  --   --  0.20*   < >  --  0.15* 0.31 0.15*  CREATININE 0.91  --   --   --  0.73  --   --   --   TROPONINIHS 1,402* 1,473*  --   --  1,086*  --   --   --    < > = values in this interval not displayed.     Estimated Creatinine Clearance: 69.9 mL/min (by C-G formula based on SCr of 0.73 mg/dL).   Medical History: Past Medical History:  Diagnosis Date   Aorto-iliac atherosclerosis 01/06/2016   Asthma    COPD (chronic obstructive pulmonary disease)    Hypertension    Osteoporosis    Tobacco abuse 01/06/2016   Vitamin D deficiency 01/06/2016    Medications:  No PTA anticoagulation per pharmacist review  Assessment: 62 yo female presented with shortness of breath, light headedness, and chest discomfort.  In the ED troponin found to be elevated.  Pharmacy consulted to start heparin infusion.  Baseline labs: hgb 15.2, hct 46.5, plt = 374, aPTT 55 INR 1.0  Goal of Therapy:   Heparin level 0.3-0.7 units/ml Monitor platelets by anticoagulation protocol: Yes   0419 2309 HL 0.20, subtherapeutic  0420 0800 HL=0.15 subtherapeutic 0420 1536 HL=0.31, therapeutic x 1 0420 2119 HL=0.15, subtherapeutic/1150 > 1400 units/hr   Plan:  Give heparin bolus 2000 units IV then increase heparin infusion to 1400 units/hr Recheck HL in 6 hrs to confirm Plan for Heart Cath on 4/22 CBC daily while on heparin  Selinda Eon, PharmD Clinical Pharmacist 11/27/2022

## 2022-11-27 NOTE — Progress Notes (Signed)
Progress Note  Patient Name: Sheila [name] Date of Encounter: 11/27/2022  Primary Cardiologist: New - consult by Sheila [name]  Subjective   No further chest pain.  No dyspnea, palpitations, dizziness, presyncope, or syncope.  Has maintained sinus rhythm since spontaneous conversion earlier in the ED yesterday.  High-sensitivity troponin has trended to 1473.  Inpatient Medications    Scheduled Meds:  aspirin EC  81 mg Oral Daily   atorvastatin  10 mg Oral QHS   pantoprazole  80 mg Oral Daily   Continuous Infusions:  heparin 950 Units/hr (11/27/22 0149)   PRN Meds: acetaminophen **OR** acetaminophen, ALPRAZolam, morphine injection, nicotine, nitroGLYCERIN, ondansetron **OR** ondansetron (ZOFRAN) IV, senna-docusate   Vital Signs    Vitals:   11/27/22 0200 11/27/22 0300 11/27/22 0432 11/27/22 0440  BP: (!) 120/92 (!) 115/90 (!) 147/91   Pulse: (!) 57 (!) 54 68   Resp: (!) Temp:    98.2 F (36.8 C)  TempSrc:    Oral  SpO2: 98% 94% 94%   Weight:      Height:        Intake/Output Summary (Last 24 hours) at 11/27/2022 0753 Last data filed at 11/27/2022 0439 Gross per 24 hour  Intake 62.62 ml  Output 1275 ml  Net -1212.38 ml   Filed Weights   11/26/22 1624  Weight: 76.7 kg    Telemetry    Sinus rhythm with sinus bradycardia - Personally Reviewed  ECG    No new tracings - Personally Reviewed  Physical Exam   GEN: No acute distress.   Neck: No JVD. Cardiac: Bradycardic, no murmurs, rubs, or gallops.  Respiratory: Diminished breath sounds along the bases bilaterally bilaterally.  GI: Soft, nontender, non-distended.   MS: No edema; No deformity. Neuro:  Alert and oriented x 3; Nonfocal.  Psych: Normal affect.  Labs    Chemistry Recent Labs  Lab 11/26/22 1526 11/27/22 0423  NA 131* 140  K 3.6 3.7  CL 100 112*  CO2 21* 22  GLUCOSE 137* 92  BUN 13 11  CREATININE 0.91 0.73  CALCIUM 9.8 9.7  GFRNONAA >60 >60  ANIONGAP 10 6      Hematology Recent Labs  Lab 11/26/22 1526 11/27/22 0423  WBC 13.8* 12.7*  RBC 4.93 4.44  HGB 15.2* 13.7  HCT 46.5* 42.5  MCV 94.3 95.7  MCH 30.8 30.9  MCHC 32.7 32.2  RDW 13.4 13.5  PLT 374 348    Cardiac EnzymesNo results for input(s): "TROPONINI" in the last 168 hours. No results for input(s): "TROPIPOC" in the last 168 hours.   BNPNo results for input(s): "BNP", "PROBNP" in the last 168 hours.   DDimer  Recent Labs  Lab 11/26/22 1526  DDIMER 0.40     Radiology    DG Chest Port 1 View  Result Date: 11/26/2022 IMPRESSION: Enlarged cardiopericardial silhouette with some vascular congestion. Electronically Signed   By: Sheila [name] M.D.   On: 11/26/2022 15:57    Cardiac Studies   PET MPI 08/13/2021 [facility]): - Normal myocardial perfusion study  - No evidence of any significant ischemia or scar  - Left ventricular systolic function is normal. Post stress the ejection  fraction is > 60%. The left ventricle is relatively small.  - No significant coronary calcifications were noted on the attenuation CT  - CT shows remarkable lipomatous hypertrophy of atrial septum extending  posteriorly and superiorly  __________  2D echo 08/12/2021 [facility]): Summary   1. The left  ventricle is normal in size with mildly increased wall  thickness.   2. The left ventricular systolic function is normal, LVEF is visually  estimated at 65-70%.    3. There is grade I diastolic dysfunction (impaired relaxation).    4. The right ventricle is normal in size, with normal systolic function.    5. The right atrium is mildly dilated  in size.   Patient Profile     62 y.o. female with history of subclavian stenosis s/p stenting in 2019, HTN, and COPD with tobacco use who we are seeing for new onset Afib with RVR and NSTEMI.  Assessment & Plan    1. NSTEMI: -Currently, without symptoms of angina -High-sensitivity troponin is trended to 1473, cycle to peak -Heparin gtt -ASA -NPO at midnight on  4/22 for [facility] Monday -Echo pending -Add low-dose carvedilol as outlined below  2. New onset Afib with RVR: -Maintaining sinus rhythm following spontaneous conversion in the ED on 4/19 with heart rates ranging from the upper 40s overnight to mid 70s bpm -Add low-dose carvedilol 3.125 mg twice daily -CHADS2VASc 4 (CHF, HTN, vascular disease, sex category) -Heparin gtt for now until it is clear she will not require further inpatient invasive testing, prior to discharge recommend transitioning to apixaban 5 mg twice daily (does not meet reduced dosing criteria)  3. HTN: -Blood pressure mildly elevated this morning -Add low-dose carvedilol as outlined above  4. HLD: -Obtain lipid panel -Now on atorvastatin 80 mg  5. History of subclavian stenosis s/p stenting in 2019: -Stenotic left subclavian artery noted on ultrasound in 2022 at [facility] -BP should be obtained in the right upper extremity only at this time, as this more accurately represents her aortic pressure   -Outpatient follow up imaging  6. Hyperglycemia: -Check A1c for further risk stratification    Shared Decision Making/Informed Consent{  The risks [stroke (1 in 1000), death (1 in 1000), kidney failure [usually temporary] (1 in 500), bleeding (1 in 200), allergic reaction [possibly serious] (1 in 200)], benefits (diagnostic support and management of coronary artery disease) and alternatives of a cardiac catheterization were discussed in detail with Sheila [name] and she is willing to proceed.   For questions or updates, please contact [facility] Please consult [email or web address] for contact info under Cardiology/STEMI.    Signed, Sheila Listen, PA-C [facility] Pager: [phone] 11/27/2022, 7:53 AM

## 2022-11-27 NOTE — Consult Note (Signed)
ANTICOAGULATION CONSULT NOTE  Pharmacy Consult for heparin infusion Indication: ACS/STEMI and Afib  Allergies  Allergen Reactions   Vicodin [Hydrocodone-Acetaminophen] Nausea And Vomiting   Vicodin [Hydrocodone-Acetaminophen]     Abd pain, N/V   Hct [Hydrochlorothiazide] Palpitations   Tramadol Palpitations    Patient Measurements: Height:  (157.5 cm) Weight: 76.7 kg (169 lb) IBW/kg (Calculated) : 50.1 Heparin Dosing Weight: 66.8 kg  Vital Signs: Temp: 98.2 F (36.8 C) (04/20 0440) Temp Source: Oral (04/20 0440) BP: 144/95 (04/20 0730) Pulse Rate: 61 (04/20 0900)  Labs: Recent Labs    11/26/22 1526 11/26/22 1815 11/26/22 2309 11/27/22 0423 11/27/22 0800  HGB 15.2*  --   --  13.7  --   HCT 46.5*  --   --  42.5  --   PLT 374  --   --  348  --   APTT  --   --  55*  --   --   LABPROT  --   --  12.9  --   --   INR  --   --  1.0  --   --   HEPARINUNFRC  --   --  0.20*  --  0.15*  CREATININE 0.91  --   --  0.73  --   TROPONINIHS 1,402* 1,473*  --  1,086*  --      Estimated Creatinine Clearance: 69.9 mL/min (by C-G formula based on SCr of 0.73 mg/dL).   Medical History: Past Medical History:  Diagnosis Date   Aorto-iliac atherosclerosis 01/06/2016   Asthma    COPD (chronic obstructive pulmonary disease)    Hypertension    Osteoporosis    Tobacco abuse 01/06/2016   Vitamin D deficiency 01/06/2016    Medications:  No PTA anticoagulation per pharmacist review  Assessment: 62 yo female presented with shortness of breath, light headedness, and chest discomfort.  In the ED troponin found to be elevated.  Pharmacy consulted to start heparin infusion.  Baseline labs: hgb 15.2, hct 46.5, plt = 374, aPTT 55 INR 1.0  Goal of Therapy:  Heparin level 0.3-0.7 units/ml Monitor platelets by anticoagulation protocol: Yes   0419 2309 HL 0.20, subtherapeutic  0420 0800 HL=0.15 subtherapeutic  Plan:  0420 0800 HL=0.15 subtherapeutic Bolus 2000 units x 1 Increase  heparin infusion to 1150 units/hr Recheck HL in 6 hrs after rate change Plan for Heart Cath on 4/22 CBC daily while on heparin  Bari Mantis PharmD Clinical Pharmacist 11/27/2022

## 2022-11-28 ENCOUNTER — Inpatient Hospital Stay (HOSPITAL_COMMUNITY)
Admit: 2022-11-28 | Discharge: 2022-11-28 | Disposition: A | Discharge status: Home / Self Care | Payer: Medicaid Other | Attending: Internal Medicine | Admitting: Internal Medicine

## 2022-11-28 DIAGNOSIS — I10 Essential (primary) hypertension: Secondary | ICD-10-CM | POA: Diagnosis not present

## 2022-11-28 DIAGNOSIS — I214 Non-ST elevation (NSTEMI) myocardial infarction: Secondary | ICD-10-CM

## 2022-11-28 DIAGNOSIS — I4891 Unspecified atrial fibrillation: Secondary | ICD-10-CM | POA: Diagnosis not present

## 2022-11-28 LAB — BASIC METABOLIC PANEL
Anion gap: 10 (ref 5–15)
BUN: 15 mg/dL (ref 8–23)
CO2: 22 mmol/L (ref 22–32)
Calcium: 10.3 mg/dL (ref 8.9–10.3)
Chloride: 110 mmol/L (ref 98–111)
Creatinine, Ser: 0.83 mg/dL (ref 0.44–1.00)
GFR, Estimated: >60 mL/min (ref >60)
Glucose, Bld: 89 mg/dL (ref 70–99)
Potassium: 3.4 mmol/L — ABNORMAL LOW (ref 3.5–5.1)
Sodium: 142 mmol/L (ref 135–145)

## 2022-11-28 LAB — ECHOCARDIOGRAM COMPLETE
AR max vel: 1.98 cm2
AV Peak grad: 7.1 mmHg
Ao pk vel: 1.33 m/s
Area-P 1/2: 2.21 cm2
Height: 62 in
S' Lateral: 3.2 cm
Weight: 2704 oz

## 2022-11-28 LAB — CBC
HCT: 45.2 % (ref 36.0–46.0)
Hemoglobin: 14.5 g/dL (ref 12.0–15.0)
MCH: 30.8 pg (ref 26.0–34.0)
MCHC: 32.1 g/dL (ref 30.0–36.0)
MCV: 96 fL (ref 80.0–100.0)
Platelets: 354 10*3/uL (ref 150–400)
RBC: 4.71 MIL/uL (ref 3.87–5.11)
RDW: 13.4 % (ref 11.5–15.5)
WBC: 11.2 10*3/uL — ABNORMAL HIGH (ref 4.0–10.5)
nRBC: 0 % (ref 0.0–0.2)

## 2022-11-28 LAB — HEPARIN LEVEL (UNFRACTIONATED)
Heparin Unfractionated: 0.73 IU/mL — ABNORMAL HIGH (ref 0.30–0.70)
Heparin Unfractionated: 0.61 IU/mL (ref 0.30–0.70)
Heparin Unfractionated: 0.46 IU/mL (ref 0.30–0.70)

## 2022-11-28 MED ORDER — SODIUM CHLORIDE 0.9 % IV SOLN: 250.0000 mL | INTRAVENOUS | Status: DC | PRN

## 2022-11-28 MED ORDER — SODIUM CHLORIDE 0.9% FLUSH: 3.0000 mL | INTRAVENOUS | Status: DC | PRN

## 2022-11-28 MED ORDER — SODIUM CHLORIDE 0.9 % WEIGHT BASED INFUSION
3.0000 mL/kg/h | INTRAVENOUS | Status: DC
  Administered 2022-11-29: 3 mL/kg/h via INTRAVENOUS

## 2022-11-28 MED ORDER — HYDRALAZINE HCL 20 MG/ML IJ SOLN
10.0000 mg | Freq: Four times a day (QID) | INTRAMUSCULAR | Status: DC | PRN
  Administered 2022-11-28 – 2022-11-29 (×2): 10 mg via INTRAVENOUS
  Filled 2022-11-28: qty 1

## 2022-11-28 MED ORDER — CARVEDILOL 6.25 MG PO TABS
6.2500 mg | ORAL_TABLET | Freq: Two times a day (BID) | ORAL | Status: DC
  Administered 2022-11-28 – 2022-11-29 (×2): 6.25 mg via ORAL
  Filled 2022-11-28 (×2): qty 1

## 2022-11-28 MED ORDER — SODIUM CHLORIDE 0.9 % WEIGHT BASED INFUSION
1.0000 mL/kg/h | INTRAVENOUS | Status: DC
  Administered 2022-11-29: 1 mL/kg/h via INTRAVENOUS

## 2022-11-28 MED ORDER — POTASSIUM CHLORIDE CRYS ER 20 MEQ PO TBCR
40.0000 meq | EXTENDED_RELEASE_TABLET | Freq: Once | ORAL | Status: AC
  Administered 2022-11-28: 40 meq via ORAL
  Filled 2022-11-28: qty 2

## 2022-11-28 MED ORDER — SODIUM CHLORIDE 0.9% FLUSH: 3.0000 mL | Freq: Two times a day (BID) | INTRAVENOUS | Status: DC

## 2022-11-28 MED ORDER — LOSARTAN POTASSIUM 25 MG PO TABS
25.0000 mg | ORAL_TABLET | Freq: Every day | ORAL | Status: DC
  Administered 2022-11-28: 25 mg via ORAL
  Filled 2022-11-28 (×2): qty 1

## 2022-11-28 NOTE — Consult Note (Signed)
ANTICOAGULATION CONSULT NOTE  Pharmacy Consult for heparin infusion Indication: ACS/STEMI and Afib  Allergies  Allergen Reactions   Vicodin [Hydrocodone-Acetaminophen] Nausea And Vomiting   Vicodin [Hydrocodone-Acetaminophen]     Abd pain, N/V   Hct [Hydrochlorothiazide] Palpitations   Tramadol Palpitations    Patient Measurements: Height:  (157.5 cm) Weight: 76.7 kg (169 lb) IBW/kg (Calculated) : 50.1 Heparin Dosing Weight: 66.8 kg  Vital Signs: Temp: 98 F (36.7 C) (04/21 2059) Temp Source: Oral (04/21 2059) BP: 178/73 (04/21 2059) Pulse Rate: 57 (04/21 2059)  Labs: Recent Labs    11/26/22 1526 11/26/22 1815 11/26/22 2309 11/27/22 0423 11/27/22 0800 11/28/22 0510 11/28/22 1306 11/28/22 2007  HGB 15.2*  --   --  13.7  --  14.5  --   --   HCT 46.5*  --   --  42.5  --  45.2  --   --   PLT 374  --   --  348  --  354  --   --   APTT  --   --  55*  --   --   --   --   --   LABPROT  --   --  12.9  --   --   --   --   --   INR  --   --  1.0  --   --   --   --   --   HEPARINUNFRC  --   --  0.20*  --    < > 0.73* 0.61 0.46  CREATININE 0.91  --   --  0.73  --  0.83  --   --   TROPONINIHS 1,402* 1,473*  --  1,086*  --   --   --   --    < > = values in this interval not displayed.     Estimated Creatinine Clearance: 67.3 mL/min (by C-G formula based on SCr of 0.83 mg/dL).   Medical History: Past Medical History:  Diagnosis Date   Aorto-iliac atherosclerosis 01/06/2016   Asthma    COPD (chronic obstructive pulmonary disease)    Hypertension    Osteoporosis    Tobacco abuse 01/06/2016   Vitamin D deficiency 01/06/2016    Medications:  No PTA anticoagulation per pharmacist review  Assessment: 62 yo female presented with shortness of breath, light headedness, and chest discomfort.  In the ED troponin found to be elevated.  Pharmacy consulted to start heparin infusion.  Baseline labs: hgb 15.2, hct 46.5, plt = 374, aPTT 55 INR 1.0  Goal of Therapy:   Heparin level 0.3-0.7 units/ml Monitor platelets by anticoagulation protocol: Yes   0419 2309 HL 0.20, subtherapeutic  0420 0800 HL=0.15 subtherapeutic 0420 1536 HL=0.31, therapeutic x 1 0420 2119 HL=0.15, subtherapeutic/1150 > 1450 units/hr 0421 0510 HL 0.73, supratherapeutic x 1, 1450>1350 u/hr 0421 1306 HL 0.61   therapeutic x1 0421 2007 HL =0.46 therapeutic x2  Plan: Continue start heparin infusion at 1350 units/hr Check anti-Xa level daily with AM labs Continue to monitor H&H and platelets daily while on heparin gtt.

## 2022-11-28 NOTE — H&P (View-Only) (Signed)
Progress Note  Patient Name: Sheila [name] Date of Encounter: 11/28/2022  Primary Cardiologist: New - consult by Mariah Milling  Subjective   No further chest pain.  No dyspnea, palpitations, dizziness, presyncope, or syncope.  Has maintained sinus rhythm since spontaneous conversion earlier in the ED 4/19.  High-sensitivity troponin has trended to 1473, now down trending. Planning for [facility] 4/22.  Inpatient Medications    Scheduled Meds:  aspirin EC  81 mg Oral Daily   atorvastatin  10 mg Oral QHS   carvedilol  3.125 mg Oral BID WC   pantoprazole  80 mg Oral Daily   Continuous Infusions:  heparin 1,350 Units/hr (11/28/22 0656)   PRN Meds: acetaminophen **OR** acetaminophen, ALPRAZolam, nicotine, nitroGLYCERIN, ondansetron **OR** ondansetron (ZOFRAN) IV, senna-docusate   Vital Signs    Vitals:   11/27/22 1601 11/27/22 2148 11/27/22 2354 11/28/22 0453  BP: (!) 162/87 (!) 169/96 (!) 143/82 (!) 172/80  Pulse: 67 (!) 53 73 (!) 54  Resp: Temp: 98.2 F (36.8 C) 98 F (36.7 C) 97.8 F (36.6 C) 97.8 F (36.6 C)  TempSrc: Oral Oral    SpO2: 96% 92% 93% 95%  Weight:      Height:        Intake/Output Summary (Last 24 hours) at 11/28/2022 0824 Last data filed at 11/28/2022 0656 Gross per 24 hour  Intake 902.04 ml  Output 2550 ml  Net -1647.96 ml    Filed Weights   11/26/22 1624  Weight: 76.7 kg    Telemetry    Sinus rhythm with sinus bradycardia - Personally Reviewed  ECG    No new tracings - Personally Reviewed  Physical Exam   GEN: No acute distress.   Neck: No JVD. Cardiac: Bradycardic, no murmurs, rubs, or gallops.  Respiratory: Diminished breath sounds along the bases bilaterally bilaterally.  GI: Soft, nontender, non-distended.   MS: No edema; No deformity. Neuro:  Alert and oriented x 3; Nonfocal.  Psych: Normal affect.  Labs    Chemistry Recent Labs  Lab 11/26/22 1526 11/27/22 0423 11/28/22 0510  NA 131* 140 142  K 3.6 3.7 3.4*   CL 100 112* 110  CO2 21* 22 22  GLUCOSE 137* 92 89  BUN CREATININE 0.91 0.73 0.83  CALCIUM 9.8 9.7 10.3  GFRNONAA >60 >60 >60  ANIONGAP Hematology Recent Labs  Lab 11/26/22 1526 11/27/22 0423 11/28/22 0510  WBC 13.8* 12.7* 11.2*  RBC 4.93 4.44 4.71  HGB 15.2* 13.7 14.5  HCT 46.5* 42.5 45.2  MCV 94.3 95.7 96.0  MCH 30.8 30.9 30.8  MCHC 32.7 32.2 32.1  RDW 13.4 13.5 13.4  PLT 374 348 354     Cardiac EnzymesNo results for input(s): "TROPONINI" in the last 168 hours. No results for input(s): "TROPIPOC" in the last 168 hours.   BNPNo results for input(s): "BNP", "PROBNP" in the last 168 hours.   DDimer  Recent Labs  Lab 11/26/22 1526  DDIMER 0.40      Radiology    DG Chest Port 1 View  Result Date: 11/26/2022 IMPRESSION: Enlarged cardiopericardial silhouette with some vascular congestion. Electronically Signed   By: Karen Kays M.D.   On: 11/26/2022 15:57    Cardiac Studies   PET MPI 08/13/2021 [facility]): - Normal myocardial perfusion study  - No evidence of any significant ischemia or scar  - Left ventricular systolic function is normal. Post stress the ejection  fraction is > 60%. The left ventricle is relatively small.  - No significant coronary calcifications were noted on the attenuation CT  - CT shows remarkable lipomatous hypertrophy of atrial septum extending  posteriorly and superiorly  __________  2D echo 08/12/2021 [facility]): Summary   1. The left ventricle is normal in size with mildly increased wall  thickness.   2. The left ventricular systolic function is normal, LVEF is visually  estimated at 65-70%.    3. There is grade I diastolic dysfunction (impaired relaxation).    4. The right ventricle is normal in size, with normal systolic function.    5. The right atrium is mildly dilated  in size.  __________  2D echo pending  Patient Profile     62 y.o. female with history of subclavian stenosis s/p stenting in 2019, HTN,  and COPD with tobacco use who we are seeing for new onset Afib with RVR and NSTEMI.  Assessment & Plan    1. NSTEMI: -Currently, without symptoms of angina -High-sensitivity troponin is trended to 1473, cycle to peak -Heparin gtt -ASA -NPO at midnight on 4/22 for [facility] Monday -Echo pending -Continue low-dose carvedilol, bradycardia precludes titration   2. New onset Afib with RVR: -Maintaining sinus rhythm following spontaneous conversion in the ED on 4/19 with heart rates ranging from the upper 40s overnight to mid 70s bpm -Add low-dose carvedilol 3.125 mg twice daily -CHADS2VASc 4 (CHF, HTN, vascular disease, sex category) -Heparin gtt for now until it is clear she will not require further inpatient invasive testing, prior to discharge recommend transitioning to apixaban 5 mg twice daily (does not meet reduced dosing criteria)  3. HTN: -Blood pressure mildly elevated this morning -Added low dose Coreg yesterday, bradycardia precludes titration  -Add losartan 25 mg   4. HLD: -LDL 116 -Now on atorvastatin 80 mg -Follow up in the outpatient setting   5. History of subclavian stenosis s/p stenting in 2019: -Stenotic left subclavian artery noted on ultrasound in 2022 at [facility] -BP should be obtained in the right upper extremity only at this time, as this more accurately represents her aortic pressure   -Outpatient follow up imaging  6. Hyperglycemia: -A1c 5.9  7. Hypokalemia: -Replete to 4.0   Shared Decision Making/Informed Consent{  The risks [stroke (1 in 1000), death (1 in 1000), kidney failure [usually temporary] (1 in 500), bleeding (1 in 200), allergic reaction [possibly serious] (1 in 200)], benefits (diagnostic support and management of coronary artery disease) and alternatives of a cardiac catheterization were discussed in detail with Ms. Frein and she is willing to proceed.   For questions or updates, please contact [facility] Please consult [email or web address] for  contact info under Cardiology/STEMI.    Signed, Eula Listen, PA-C [facility] Pager: [phone] 11/28/2022, 8:24 AM

## 2022-11-28 NOTE — Consult Note (Addendum)
ANTICOAGULATION CONSULT NOTE  Pharmacy Consult for heparin infusion Indication: ACS/STEMI and Afib  Allergies  Allergen Reactions   Vicodin [Hydrocodone-Acetaminophen] Nausea And Vomiting   Vicodin [Hydrocodone-Acetaminophen]     Abd pain, N/V   Hct [Hydrochlorothiazide] Palpitations   Tramadol Palpitations    Patient Measurements: Height:  (157.5 cm) Weight: 76.7 kg (169 lb) IBW/kg (Calculated) : 50.1 Heparin Dosing Weight: 66.8 kg  Vital Signs: Temp: 97.8 F (36.6 C) (04/21 0453) Temp Source: Oral (04/20 2148) BP: 172/80 (04/21 0453) Pulse Rate: 54 (04/21 0453)  Labs: Recent Labs    11/26/22 1526 11/26/22 1815 11/26/22 2309 11/27/22 0423 11/27/22 0800 11/27/22 1536 11/27/22 2119 11/28/22 0510  HGB 15.2*  --   --  13.7  --   --   --  14.5  HCT 46.5*  --   --  42.5  --   --   --  45.2  PLT 374  --   --  348  --   --   --  354  APTT  --   --  55*  --   --   --   --   --   LABPROT  --   --  12.9  --   --   --   --   --   INR  --   --  1.0  --   --   --   --   --   HEPARINUNFRC  --   --  0.20*  --    < > 0.31 0.15* 0.73*  CREATININE 0.91  --   --  0.73  --   --   --  0.83  TROPONINIHS 1,402* 1,473*  --  1,086*  --   --   --   --    < > = values in this interval not displayed.     Estimated Creatinine Clearance: 67.3 mL/min (by C-G formula based on SCr of 0.83 mg/dL).   Medical History: Past Medical History:  Diagnosis Date   Aorto-iliac atherosclerosis 01/06/2016   Asthma    COPD (chronic obstructive pulmonary disease)    Hypertension    Osteoporosis    Tobacco abuse 01/06/2016   Vitamin D deficiency 01/06/2016    Medications:  No PTA anticoagulation per pharmacist review  Assessment: 62 yo female presented with shortness of breath, light headedness, and chest discomfort.  In the ED troponin found to be elevated.  Pharmacy consulted to start heparin infusion.  Baseline labs: hgb 15.2, hct 46.5, plt = 374, aPTT 55 INR 1.0  Goal of Therapy:   Heparin level 0.3-0.7 units/ml Monitor platelets by anticoagulation protocol: Yes   0419 2309 HL 0.20, subtherapeutic  0420 0800 HL=0.15 subtherapeutic 0420 1536 HL=0.31, therapeutic x 1 0420 2119 HL=0.15, subtherapeutic/1150 > 1450 units/hr 0421 0510 HL 0.73, supratherapeutic x 1   Plan:  Decrease heparin infusion to 1350 units/hr Recheck HL in 6 hrs after rate change Plan for Heart Cath on 4/22 CBC daily while on heparin  Otelia Sergeant, PharmD, [facility] 11/28/2022 6:32 AM

## 2022-11-28 NOTE — Progress Notes (Signed)
  Echocardiogram 2D Echocardiogram has been performed.  Sheila [name] 11/28/2022, 9:37 AM

## 2022-11-28 NOTE — Consult Note (Signed)
ANTICOAGULATION CONSULT NOTE  Pharmacy Consult for heparin infusion Indication: ACS/STEMI and Afib  Allergies  Allergen Reactions   Vicodin [Hydrocodone-Acetaminophen] Nausea And Vomiting   Vicodin [Hydrocodone-Acetaminophen]     Abd pain, N/V   Hct [Hydrochlorothiazide] Palpitations   Tramadol Palpitations    Patient Measurements: Height:  (157.5 cm) Weight: 76.7 kg (169 lb) IBW/kg (Calculated) : 50.1 Heparin Dosing Weight: 66.8 kg  Vital Signs: Temp: 97.5 F (36.4 C) (04/21 1205) BP: 164/81 (04/21 1205) Pulse Rate: 52 (04/21 1205)  Labs: Recent Labs    11/26/22 1526 11/26/22 1815 11/26/22 2309 11/27/22 0423 11/27/22 0800 11/27/22 2119 11/28/22 0510 11/28/22 1306  HGB 15.2*  --   --  13.7  --   --  14.5  --   HCT 46.5*  --   --  42.5  --   --  45.2  --   PLT 374  --   --  348  --   --  354  --   APTT  --   --  55*  --   --   --   --   --   LABPROT  --   --  12.9  --   --   --   --   --   INR  --   --  1.0  --   --   --   --   --   HEPARINUNFRC  --   --  0.20*  --    < > 0.15* 0.73* 0.61  CREATININE 0.91  --   --  0.73  --   --  0.83  --   TROPONINIHS 1,402* 1,473*  --  1,086*  --   --   --   --    < > = values in this interval not displayed.     Estimated Creatinine Clearance: 67.3 mL/min (by C-G formula based on SCr of 0.83 mg/dL).   Medical History: Past Medical History:  Diagnosis Date   Aorto-iliac atherosclerosis 01/06/2016   Asthma    COPD (chronic obstructive pulmonary disease)    Hypertension    Osteoporosis    Tobacco abuse 01/06/2016   Vitamin D deficiency 01/06/2016    Medications:  No PTA anticoagulation per pharmacist review  Assessment: 62 yo female presented with shortness of breath, light headedness, and chest discomfort.  In the ED troponin found to be elevated.  Pharmacy consulted to start heparin infusion.  Baseline labs: hgb 15.2, hct 46.5, plt = 374, aPTT 55 INR 1.0  Goal of Therapy:  Heparin level 0.3-0.7  units/ml Monitor platelets by anticoagulation protocol: Yes   0419 2309 HL 0.20, subtherapeutic  0420 0800 HL=0.15 subtherapeutic 0420 1536 HL=0.31, therapeutic x 1 0420 2119 HL=0.15, subtherapeutic/1150 > 1450 units/hr 0421 0510 HL 0.73, supratherapeutic x 1, 1450>1350 u/hr 0421 1306 HL 0.61   therapeutic x1   Plan:  0421 1306  HL 0.61   therapeutic x1 Continue heparin infusion at 1350 units/hr Check confirmatory HL in 6 hrs  Plan for Heart Cath on 4/22 CBC daily while on heparin  Bari Mantis PharmD Clinical Pharmacist 11/28/2022

## 2022-11-28 NOTE — Progress Notes (Signed)
[facility]  - [place] at [facility]   PATIENT NAME: Sheila [name]    MR#:  [id number]  DATE OF BIRTH:  [birth year]  SUBJECTIVE:  patient seen earlier. No family at bedside. Came in with chest pain and shortness of breath. She tells me she feels a whole lot better. Denies any chest pain. She ruled in for non-STEMI.    VITALS:  Blood pressure (!) 164/81, pulse (!) 52, temperature (!) 97.5 F (36.4 C), resp. rate 16, height  (1.575 m), weight 76.7 kg, last menstrual period 08/09/2008, SpO2 96 %.  PHYSICAL EXAMINATION:   GENERAL:  61 y.o.-year-old patient with no acute distress.  LUNGS: Normal breath sounds bilaterally, no wheezing CARDIOVASCULAR: S1, S2 normal. No murmur   ABDOMEN: Soft, nontender, nondistended. Bowel sounds present.  EXTREMITIES: No  edema b/l.    NEUROLOGIC: nonfocal  patient is alert and awake SKIN: No obvious rash, lesion, or ulcer.   LABORATORY PANEL:  CBC Recent Labs  Lab 11/28/22 0510  WBC 11.2*  HGB 14.5  HCT 45.2  PLT 354     Chemistries  Recent Labs  Lab 11/26/22 1526 11/27/22 0423 11/28/22 0510  NA 131*   < > 142  K 3.6   < > 3.4*  CL 100   < > 110  CO2 21*   < > 22  GLUCOSE 137*   < > 89  BUN 13   < > 15  CREATININE 0.91   < > 0.83  CALCIUM 9.8   < > 10.3  MG 1.8  --   --    < > = values in this interval not displayed.    RADIOLOGY:  DG Chest Port 1 View  Result Date: 11/26/2022 CLINICAL DATA:  Chest pain EXAM: PORTABLE CHEST 1 VIEW COMPARISON:  X-ray 02/26/2017.  CT 01/30/2018 FINDINGS: Overlapping cardiac leads and defibrillator pads. Enlarged cardiopericardial silhouette with some vascular congestion. No consolidation, pneumothorax or effusion. IMPRESSION: Enlarged cardiopericardial silhouette with some vascular congestion. Electronically Signed   By: Karen Kays M.D.   On: 11/26/2022 15:57    Assessment and Plan  Sheila [name] is a 62 year old female with history of persistent tobacco use, nicotine  dependence, hypertension with medication resistant hypertension, hyperlipidemia, COPD, heart failure preserved ejection fraction, who presents emergency department for chief concerns of shortness of breath, dizziness, left arm numbness.  High sensitive troponin is 1402.  ED treatment: Aspirin 324 mg p.o. one-time dose, diltiazem 50 mg IV one-time dose, heparin GTT per pharmacy ordered.  NSTEMI (non-ST elevated myocardial infarction) --Concerning for ACS given patient high risk: Hypertension, PAD, persistent tobacco use --prn nitro, heparin gtt --Cardiology has been consulted Dr. Mariah Milling --plans for [facility] on Monday -- continue Coreg and statins   Paroxysmal atrial fibrillation with RVR --New diagnosis of atrial fibrillation, found to be in RVR --Responded to diltiazem 15 mg IV one-time dose per EDP --Diltiazem 10 mg IV every 6 hours as needed for heart rate greater than 120 --on coreg --HR better --on Heparin gtt--transition to eliquis at discharge --CHADS2VASc 4   Dyspnea on effort Suspect secondary to chest pain, elevated troponin, concerning for unstable angina   Hyperlipidemia --Atorvastatin    PAD (peripheral artery disease) --Patient is on aspirin 81 mg --Patient smokes cigarettes,Counseling given   Tobacco abuse --As needed nicotine patch ordered --Patient is not ready to quit   Essential hypertension --on coreg    Procedures: Family communication :none Consults :[facility] cardiology CODE STATUS: full DVT Prophylaxis :heparin gtt  Level of care: Progressive Status is: Inpatient Remains inpatient appropriate because: NSTEMI. Chat planned for monday    TOTAL TIME TAKING CARE OF THIS PATIENT: 35 minutes.  >50% time spent on counselling and coordination of care  Note: This dictation was prepared with Dragon dictation along with smaller phrase technology. Any transcriptional errors that result from this process are unintentional.  Enedina Finner M.D    [facility]    CC: Primary care physician; Girtha Rm, Eli Phillips, MD

## 2022-11-28 NOTE — Progress Notes (Signed)
Progress Note  Patient Name: Sheila [name] Date of Encounter: 11/28/2022  Primary Cardiologist: New - consult by Mariah Milling  Subjective   No further chest pain.  No dyspnea, palpitations, dizziness, presyncope, or syncope.  Has maintained sinus rhythm since spontaneous conversion earlier in the ED 4/19.  High-sensitivity troponin has trended to 1473, now down trending. Planning for [facility] 4/22.  Inpatient Medications    Scheduled Meds:  aspirin EC  81 mg Oral Daily   atorvastatin  10 mg Oral QHS   carvedilol  3.125 mg Oral BID WC   pantoprazole  80 mg Oral Daily   Continuous Infusions:  heparin 1,350 Units/hr (11/28/22 0656)   PRN Meds: acetaminophen **OR** acetaminophen, ALPRAZolam, nicotine, nitroGLYCERIN, ondansetron **OR** ondansetron (ZOFRAN) IV, senna-docusate   Vital Signs    Vitals:   11/27/22 1601 11/27/22 2148 11/27/22 2354 11/28/22 0453  BP: (!) 162/87 (!) 169/96 (!) 143/82 (!) 172/80  Pulse: 67 (!) 53 73 (!) 54  Resp: Temp: 98.2 F (36.8 C) 98 F (36.7 C) 97.8 F (36.6 C) 97.8 F (36.6 C)  TempSrc: Oral Oral    SpO2: 96% 92% 93% 95%  Weight:      Height:        Intake/Output Summary (Last 24 hours) at 11/28/2022 0824 Last data filed at 11/28/2022 0656 Gross per 24 hour  Intake 902.04 ml  Output 2550 ml  Net -1647.96 ml    Filed Weights   11/26/22 1624  Weight: 76.7 kg    Telemetry    Sinus rhythm with sinus bradycardia - Personally Reviewed  ECG    No new tracings - Personally Reviewed  Physical Exam   GEN: No acute distress.   Neck: No JVD. Cardiac: Bradycardic, no murmurs, rubs, or gallops.  Respiratory: Diminished breath sounds along the bases bilaterally bilaterally.  GI: Soft, nontender, non-distended.   MS: No edema; No deformity. Neuro:  Alert and oriented x 3; Nonfocal.  Psych: Normal affect.  Labs    Chemistry Recent Labs  Lab 11/26/22 1526 11/27/22 0423 11/28/22 0510  NA 131* 140 142  K 3.6 3.7 3.4*   CL 100 112* 110  CO2 21* 22 22  GLUCOSE 137* 92 89  BUN CREATININE 0.91 0.73 0.83  CALCIUM 9.8 9.7 10.3  GFRNONAA >60 >60 >60  ANIONGAP Hematology Recent Labs  Lab 11/26/22 1526 11/27/22 0423 11/28/22 0510  WBC 13.8* 12.7* 11.2*  RBC 4.93 4.44 4.71  HGB 15.2* 13.7 14.5  HCT 46.5* 42.5 45.2  MCV 94.3 95.7 96.0  MCH 30.8 30.9 30.8  MCHC 32.7 32.2 32.1  RDW 13.4 13.5 13.4  PLT 374 348 354     Cardiac EnzymesNo results for input(s): "TROPONINI" in the last 168 hours. No results for input(s): "TROPIPOC" in the last 168 hours.   BNPNo results for input(s): "BNP", "PROBNP" in the last 168 hours.   DDimer  Recent Labs  Lab 11/26/22 1526  DDIMER 0.40      Radiology    DG Chest Port 1 View  Result Date: 11/26/2022 IMPRESSION: Enlarged cardiopericardial silhouette with some vascular congestion. Electronically Signed   By: Karen Kays M.D.   On: 11/26/2022 15:57    Cardiac Studies   PET MPI 08/13/2021 [facility]): - Normal myocardial perfusion study  - No evidence of any significant ischemia or scar  - Left ventricular systolic function is normal. Post stress the ejection  fraction is > 60%. The left ventricle is relatively small.  - No significant coronary calcifications were noted on the attenuation CT  - CT shows remarkable lipomatous hypertrophy of atrial septum extending  posteriorly and superiorly  __________  2D echo 08/12/2021 ([facility]): Summary   1. The left ventricle is normal in size with mildly increased wall  thickness.   2. The left ventricular systolic function is normal, LVEF is visually  estimated at 65-70%.    3. There is grade I diastolic dysfunction (impaired relaxation).    4. The right ventricle is normal in size, with normal systolic function.    5. The right atrium is mildly dilated  in size.  __________  2D echo pending  Patient Profile     61 y.o. female with history of subclavian stenosis s/p stenting in 2019, HTN,  and COPD with tobacco use who we are seeing for new onset Afib with RVR and NSTEMI.  Assessment & Plan    1. NSTEMI: -Currently, without symptoms of angina -High-sensitivity troponin is trended to 1473, cycle to peak -Heparin gtt -ASA -NPO at midnight on 4/22 for [facility] Monday -Echo pending -Continue low-dose carvedilol, bradycardia precludes titration   2. New onset Afib with RVR: -Maintaining sinus rhythm following spontaneous conversion in the ED on 4/19 with heart rates ranging from the upper 40s overnight to mid 70s bpm -Add low-dose carvedilol 3.125 mg twice daily -CHADS2VASc 4 (CHF, HTN, vascular disease, sex category) -Heparin gtt for now until it is clear she will not require further inpatient invasive testing, prior to discharge recommend transitioning to apixaban 5 mg twice daily (does not meet reduced dosing criteria)  3. HTN: -Blood pressure mildly elevated this morning -Added low dose Coreg yesterday, bradycardia precludes titration  -Add losartan 25 mg   4. HLD: -LDL 116 -Now on atorvastatin 80 mg -Follow up in the outpatient setting   5. History of subclavian stenosis s/p stenting in 2019: -Stenotic left subclavian artery noted on ultrasound in 2022 at [facility] -BP should be obtained in the right upper extremity only at this time, as this more accurately represents her aortic pressure   -Outpatient follow up imaging  6. Hyperglycemia: -A1c 5.9  7. Hypokalemia: -Replete to 4.0   Shared Decision Making/Informed Consent{  The risks [stroke (1 in 1000), death (1 in 1000), kidney failure [usually temporary] (1 in 500), bleeding (1 in 200), allergic reaction [possibly serious] (1 in 200)], benefits (diagnostic support and management of coronary artery disease) and alternatives of a cardiac catheterization were discussed in detail with Ms. Kuehne and she is willing to proceed.   For questions or updates, please contact [facility] Please consult [email or web address] for  contact info under Cardiology/STEMI.    Signed, Annika Selke, PA-C [facility] Pager: [phone] 11/28/2022, 8:24 AM  

## 2022-11-29 ENCOUNTER — Encounter: Payer: Self-pay | Admitting: Internal Medicine

## 2022-11-29 ENCOUNTER — Other Ambulatory Visit (HOSPITAL_COMMUNITY): Payer: Self-pay

## 2022-11-29 ENCOUNTER — Encounter
Admission: EM | Disposition: A | Discharge status: Home / Self Care | Payer: Self-pay | Source: Home / Self Care | Attending: Internal Medicine

## 2022-11-29 DIAGNOSIS — I214 Non-ST elevation (NSTEMI) myocardial infarction: Secondary | ICD-10-CM | POA: Diagnosis not present

## 2022-11-29 DIAGNOSIS — R7989 Other specified abnormal findings of blood chemistry: Secondary | ICD-10-CM | POA: Diagnosis not present

## 2022-11-29 DIAGNOSIS — I4891 Unspecified atrial fibrillation: Secondary | ICD-10-CM | POA: Diagnosis not present

## 2022-11-29 HISTORY — PX: LEFT HEART CATH AND CORONARY ANGIOGRAPHY: CATH118249

## 2022-11-29 LAB — HEPARIN LEVEL (UNFRACTIONATED): Heparin Unfractionated: 0.54 IU/mL (ref 0.30–0.70)

## 2022-11-29 SURGERY — LEFT HEART CATH AND CORONARY ANGIOGRAPHY
Anesthesia: Moderate Sedation

## 2022-11-29 MED ORDER — LIDOCAINE HCL 1 % IJ SOLN
INTRAMUSCULAR | Status: AC
  Filled 2022-11-29: qty 20

## 2022-11-29 MED ORDER — VERAPAMIL HCL 2.5 MG/ML IV SOLN
INTRAVENOUS | Status: DC | PRN
  Administered 2022-11-29: 2.5 mg via INTRA_ARTERIAL

## 2022-11-29 MED ORDER — FENTANYL CITRATE (PF) 100 MCG/2ML IJ SOLN
INTRAMUSCULAR | Status: DC | PRN
  Administered 2022-11-29: 25 ug via INTRAVENOUS

## 2022-11-29 MED ORDER — MIDAZOLAM HCL 2 MG/2ML IJ SOLN
INTRAMUSCULAR | Status: DC | PRN
  Administered 2022-11-29: 1 mg via INTRAVENOUS

## 2022-11-29 MED ORDER — HEPARIN SODIUM (PORCINE) 1000 UNIT/ML IJ SOLN
INTRAMUSCULAR | Status: DC | PRN
  Administered 2022-11-29: 3500 [IU] via INTRAVENOUS

## 2022-11-29 MED ORDER — VERAPAMIL HCL 2.5 MG/ML IV SOLN
INTRAVENOUS | Status: AC
  Filled 2022-11-29: qty 2

## 2022-11-29 MED ORDER — IOHEXOL 300 MG/ML  SOLN
INTRAMUSCULAR | Status: DC | PRN
  Administered 2022-11-29: 35 mL

## 2022-11-29 MED ORDER — LOSARTAN POTASSIUM 50 MG PO TABS
50.0000 mg | ORAL_TABLET | Freq: Every day | ORAL | Status: DC
  Administered 2022-11-29: 50 mg via ORAL
  Filled 2022-11-29: qty 1

## 2022-11-29 MED ORDER — SODIUM CHLORIDE 0.9% FLUSH
3.0000 mL | Freq: Two times a day (BID) | INTRAVENOUS | Status: DC
  Administered 2022-11-29 – 2022-11-30 (×2): 3 mL via INTRAVENOUS

## 2022-11-29 MED ORDER — LOSARTAN POTASSIUM 50 MG PO TABS
100.0000 mg | ORAL_TABLET | Freq: Every day | ORAL | Status: DC
  Administered 2022-11-30: 100 mg via ORAL
  Filled 2022-11-29: qty 2

## 2022-11-29 MED ORDER — SODIUM CHLORIDE 0.9 % IV SOLN: 250.0000 mL | INTRAVENOUS | Status: DC | PRN

## 2022-11-29 MED ORDER — ASPIRIN 81 MG PO TBEC
81.0000 mg | DELAYED_RELEASE_TABLET | Freq: Every day | ORAL | Status: DC
  Administered 2022-11-30: 81 mg via ORAL
  Filled 2022-11-29: qty 1

## 2022-11-29 MED ORDER — FENTANYL CITRATE (PF) 100 MCG/2ML IJ SOLN
INTRAMUSCULAR | Status: AC
  Filled 2022-11-29: qty 2

## 2022-11-29 MED ORDER — HEPARIN (PORCINE) 25000 UT/250ML-% IV SOLN
1350.0000 [IU]/h | INTRAVENOUS | Status: AC
  Administered 2022-11-29: 1350 [IU]/h via INTRAVENOUS
  Filled 2022-11-29: qty 250

## 2022-11-29 MED ORDER — SODIUM CHLORIDE 0.9 % IV SOLN: INTRAVENOUS | Status: AC

## 2022-11-29 MED ORDER — LABETALOL HCL 5 MG/ML IV SOLN
INTRAVENOUS | Status: DC | PRN
  Administered 2022-11-29: 10 mg via INTRAVENOUS

## 2022-11-29 MED ORDER — MIDAZOLAM HCL 2 MG/2ML IJ SOLN
INTRAMUSCULAR | Status: AC
  Filled 2022-11-29: qty 2

## 2022-11-29 MED ORDER — LABETALOL HCL 5 MG/ML IV SOLN
INTRAVENOUS | Status: AC
  Filled 2022-11-29: qty 4

## 2022-11-29 MED ORDER — ASPIRIN 81 MG PO CHEW
81.0000 mg | CHEWABLE_TABLET | Freq: Once | ORAL | Status: AC
  Administered 2022-11-29: 81 mg via ORAL
  Filled 2022-11-29: qty 1

## 2022-11-29 MED ORDER — LABETALOL HCL 5 MG/ML IV SOLN: 10.0000 mg | INTRAVENOUS | Status: AC | PRN

## 2022-11-29 MED ORDER — HEPARIN SODIUM (PORCINE) 1000 UNIT/ML IJ SOLN
INTRAMUSCULAR | Status: AC
  Filled 2022-11-29: qty 10

## 2022-11-29 MED ORDER — APIXABAN 5 MG PO TABS
5.0000 mg | ORAL_TABLET | Freq: Two times a day (BID) | ORAL | Status: DC
  Administered 2022-11-30: 5 mg via ORAL
  Filled 2022-11-29: qty 1

## 2022-11-29 MED ORDER — HYDRALAZINE HCL 20 MG/ML IJ SOLN
INTRAMUSCULAR | Status: AC
  Filled 2022-11-29: qty 1

## 2022-11-29 MED ORDER — CARVEDILOL 12.5 MG PO TABS
12.5000 mg | ORAL_TABLET | Freq: Two times a day (BID) | ORAL | Status: DC
  Administered 2022-11-29 – 2022-11-30 (×2): 12.5 mg via ORAL
  Filled 2022-11-29 (×2): qty 1

## 2022-11-29 MED ORDER — HEPARIN (PORCINE) IN NACL 1000-0.9 UT/500ML-% IV SOLN
INTRAVENOUS | Status: DC | PRN
  Administered 2022-11-29: 1000 mL

## 2022-11-29 MED ORDER — HEPARIN (PORCINE) IN NACL 1000-0.9 UT/500ML-% IV SOLN
INTRAVENOUS | Status: AC
  Filled 2022-11-29: qty 1000

## 2022-11-29 MED ORDER — SODIUM CHLORIDE 0.9% FLUSH: 3.0000 mL | INTRAVENOUS | Status: DC | PRN

## 2022-11-29 MED ORDER — ACETAMINOPHEN 325 MG PO TABS
ORAL_TABLET | ORAL | Status: AC
  Filled 2022-11-29: qty 2

## 2022-11-29 SURGICAL SUPPLY — 12 items
CATH INFINITI 5FR JK (CATHETERS) IMPLANT
CATH INFINITI JR4 5F (CATHETERS) IMPLANT
DEVICE RAD TR BAND REGULAR (VASCULAR PRODUCTS) IMPLANT
DRAPE BRACHIAL (DRAPES) IMPLANT
DRAPE FEMORAL ANGIO W/ POUCH (DRAPES) IMPLANT
GLIDESHEATH SLEND SS 6F .021 (SHEATH) IMPLANT
GUIDEWIRE INQWIRE 1.5J.035X260 (WIRE) IMPLANT
INQWIRE 1.5J .035X260CM (WIRE) ×1
PACK CARDIAC CATH (CUSTOM PROCEDURE TRAY) ×1 IMPLANT
PROTECTION STATION PRESSURIZED (MISCELLANEOUS) ×1
SET ATX-X65L (MISCELLANEOUS) IMPLANT
STATION PROTECTION PRESSURIZED (MISCELLANEOUS) IMPLANT

## 2022-11-29 NOTE — Consult Note (Signed)
ANTICOAGULATION CONSULT NOTE  Pharmacy Consult for heparin infusion Indication: ACS/STEMI and Afib  Allergies  Allergen Reactions   Vicodin [Hydrocodone-Acetaminophen] Nausea And Vomiting   Vicodin [Hydrocodone-Acetaminophen]     Abd pain, N/V   Hct [Hydrochlorothiazide] Palpitations   Tramadol Palpitations    Patient Measurements: Height:  (157.5 cm) Weight: 76.7 kg (169 lb) IBW/kg (Calculated) : 50.1 Heparin Dosing Weight: 66.8 kg  Vital Signs: Temp: 98.1 F (36.7 C) (04/22 0400) Temp Source: Oral (04/22 0400) BP: 152/91 (04/22 0400) Pulse Rate: 61 (04/22 0400)  Labs: Recent Labs    11/26/22 1526 11/26/22 1815 11/26/22 2309 11/27/22 0423 11/27/22 0800 11/28/22 0510 11/28/22 1306 11/28/22 2007 11/29/22 0509  HGB 15.2*  --   --  13.7  --  14.5  --   --   --   HCT 46.5*  --   --  42.5  --  45.2  --   --   --   PLT 374  --   --  348  --  354  --   --   --   APTT  --   --  55*  --   --   --   --   --   --   LABPROT  --   --  12.9  --   --   --   --   --   --   INR  --   --  1.0  --   --   --   --   --   --   HEPARINUNFRC  --   --  0.20*  --    < > 0.73* 0.61 0.46 0.54  CREATININE 0.91  --   --  0.73  --  0.83  --   --   --   TROPONINIHS 1,402* 1,473*  --  1,086*  --   --   --   --   --    < > = values in this interval not displayed.     Estimated Creatinine Clearance: 67.3 mL/min (by C-G formula based on SCr of 0.83 mg/dL).   Medical History: Past Medical History:  Diagnosis Date   Aorto-iliac atherosclerosis 01/06/2016   Asthma    COPD (chronic obstructive pulmonary disease)    Hypertension    Osteoporosis    Tobacco abuse 01/06/2016   Vitamin D deficiency 01/06/2016    Medications:  No PTA anticoagulation per pharmacist review  Assessment: 62 yo female presented with shortness of breath, light headedness, and chest discomfort.  In the ED troponin found to be elevated.  Pharmacy consulted to start heparin infusion.  Baseline labs: hgb 15.2, hct  46.5, plt = 374, aPTT 55 INR 1.0  Goal of Therapy:  Heparin level 0.3-0.7 units/ml Monitor platelets by anticoagulation protocol: Yes   0419 2309 HL 0.20, subtherapeutic  0420 0800 HL=0.15 subtherapeutic 0420 1536 HL=0.31, therapeutic x 1 0420 2119 HL=0.15, subtherapeutic/1150 > 1450 units/hr 0421 0510 HL 0.73, supratherapeutic x 1, 1450>1350 u/hr 0421 1306 HL 0.61   therapeutic x1 0421 2007 HL =0.46 therapeutic x2 0422 0509 HL 0.54, therapeutic x 3  Plan: Continue start heparin infusion at 1350 units/hr Check anti-Xa level daily with AM labs Continue to monitor H&H and platelets daily while on heparin gtt.  Otelia Sergeant, PharmD, [facility] 11/29/2022 6:40 AM

## 2022-11-29 NOTE — Progress Notes (Signed)
Progress Note  Patient Name: Sheila [name] Date of Encounter: 11/29/2022  Primary Cardiologist: New - consult by [facility]  Subjective   No further chest pain.  No dyspnea, palpitations, dizziness, presyncope, or syncope.  Has maintained sinus rhythm since spontaneous conversion earlier in the ED 4/19.  High-sensitivity troponin has trended to 1473, down trending. Planning for [facility] today.  Inpatient Medications    Scheduled Meds:  aspirin EC  81 mg Oral Daily   atorvastatin  10 mg Oral QHS   carvedilol  6.25 mg Oral BID WC   losartan  25 mg Oral Daily   pantoprazole  80 mg Oral Daily   sodium chloride flush  3 mL Intravenous Q12H   Continuous Infusions:  sodium chloride     sodium chloride 1 mL/kg/hr (11/29/22 0459)   heparin 1,350 Units/hr (11/29/22 0352)   PRN Meds: sodium chloride, acetaminophen **OR** acetaminophen, ALPRAZolam, hydrALAZINE, nicotine, ondansetron **OR** ondansetron (ZOFRAN) IV, senna-docusate, sodium chloride flush   Vital Signs    Vitals:   11/28/22 1800 11/28/22 2059 11/28/22 2346 11/29/22 0400  BP: 133/65 (!) 178/73 (!) 144/71 (!) 152/91  Pulse: 70 (!) 57 (!) 59 61  Resp:  18 18 16   Temp:  98 F (36.7 C) 98.4 F (36.9 C) 98.1 F (36.7 C)  TempSrc:  Oral Oral Oral  SpO2:  93% 99% 97%  Weight:      Height:        Intake/Output Summary (Last 24 hours) at 11/29/2022 0810 Last data filed at 11/28/2022 1600 Gross per 24 hour  Intake 361.14 ml  Output --  Net 361.14 ml    Filed Weights   11/26/22 1624  Weight: 76.7 kg    Telemetry    Sinus rhythm with sinus bradycardia - Personally Reviewed  ECG    No new tracings - Personally Reviewed  Physical Exam   GEN: No acute distress.   Neck: No JVD. Cardiac: Bradycardic, no murmurs, rubs, or gallops.  Respiratory: Diminished breath sounds along the bases bilaterally bilaterally.  GI: Soft, nontender, non-distended.   MS: No edema; No deformity. Neuro:  Alert and oriented x 3; Nonfocal.   Psych: Normal affect.  Labs    Chemistry Recent Labs  Lab 11/26/22 1526 11/27/22 0423 11/28/22 0510  NA 131* 140 142  K 3.6 3.7 3.4*  CL 100 112* 110  CO2 21* 22 22  GLUCOSE 137* 92 89  BUN 13 11 15   CREATININE 0.91 0.73 0.83  CALCIUM 9.8 9.7 10.3  GFRNONAA >60 >60 >60  ANIONGAP 10 6 10       Hematology Recent Labs  Lab 11/26/22 1526 11/27/22 0423 11/28/22 0510  WBC 13.8* 12.7* 11.2*  RBC 4.93 4.44 4.71  HGB 15.2* 13.7 14.5  HCT 46.5* 42.5 45.2  MCV 94.3 95.7 96.0  MCH 30.8 30.9 30.8  MCHC 32.7 32.2 32.1  RDW 13.4 13.5 13.4  PLT 374 348 354     Cardiac EnzymesNo results for input(s): "TROPONINI" in the last 168 hours. No results for input(s): "TROPIPOC" in the last 168 hours.   BNPNo results for input(s): "BNP", "PROBNP" in the last 168 hours.   DDimer  Recent Labs  Lab 11/26/22 1526  DDIMER 0.40      Radiology    DG Chest Port 1 View  Result Date: 11/26/2022 IMPRESSION: Enlarged cardiopericardial silhouette with some vascular congestion. Electronically Signed   By: Karen Kays M.D.   On: 11/26/2022 15:57    Cardiac Studies   PET  MPI 08/13/2021 [facility]): - Normal myocardial perfusion study  - No evidence of any significant ischemia or scar  - Left ventricular systolic function is normal. Post stress the ejection  fraction is > 60%. The left ventricle is relatively small.  - No significant coronary calcifications were noted on the attenuation CT  - CT shows remarkable lipomatous hypertrophy of atrial septum extending  posteriorly and superiorly  __________  2D echo 08/12/2021 [facility]): Summary   1. The left ventricle is normal in size with mildly increased wall  thickness.   2. The left ventricular systolic function is normal, LVEF is visually  estimated at 65-70%.    3. There is grade I diastolic dysfunction (impaired relaxation).    4. The right ventricle is normal in size, with normal systolic function.    5. The right atrium is mildly dilated   in size.  __________  2D echo 11/28/2022: 1. Left ventricular ejection fraction, by estimation, is 55 to 60%. The  left ventricle has normal function. The left ventricle has no regional  wall motion abnormalities. There is mild left ventricular hypertrophy.  Left ventricular diastolic parameters  are consistent with Grade II diastolic dysfunction (pseudonormalization).   2. Right ventricular systolic function is normal. The right ventricular  size is normal.   3. The mitral valve is normal in structure. No evidence of mitral valve  regurgitation.   4. The aortic valve was not well visualized. Aortic valve regurgitation  is not visualized. Aortic valve sclerosis is present, with no evidence of  aortic valve stenosis.   5. The inferior vena cava is normal in size with greater than 50%  respiratory variability, suggesting right atrial pressure of 3 mmHg.   Patient Profile     62 y.o. female with history of subclavian stenosis s/p stenting in 2019, HTN, and COPD with tobacco use who we are seeing for new onset Afib with RVR and NSTEMI.  Assessment & Plan    1. NSTEMI: -Currently, without symptoms of angina -High-sensitivity troponin is trended to 1473, cycle to peak -Heparin gtt -ASA -NPO  -LHC later today -Echo with normal LVSF and wall motion  -Continue low-dose carvedilol, bradycardia precludes titration   2. New onset Afib with RVR: -Maintaining sinus rhythm following spontaneous conversion in the ED on 4/19 with heart rates ranging from the upper 40s overnight to mid 70s bpm -Add low-dose carvedilol 3.125 mg twice daily -CHADS2VASc 4 (CHF, HTN, vascular disease, sex category) -Heparin gtt for now until it is clear she will not require further inpatient invasive testing, prior to discharge recommend transitioning to apixaban 5 mg twice daily (does not meet reduced dosing criteria)  3. HTN: -Blood pressure mildly elevated this morning -Continue Coreg 6.25 mg bid, bradycardia  precludes titration  -Titrate losartan to 50 mg   4. HLD: -LDL 116 -Now on atorvastatin 80 mg -Follow up in the outpatient setting   5. History of subclavian stenosis s/p stenting in 2019: -Stenotic left subclavian artery noted on ultrasound in 2022 at [facility] -BP should be obtained in the right upper extremity only at this time, as this more accurately represents her aortic pressure   -Outpatient follow up imaging  6. Hyperglycemia: -A1c 5.9  7. Hypokalemia: -Replete to 4.0   Shared Decision Making/Informed Consent{  The risks [stroke (1 in 1000), death (1 in 1000), kidney failure [usually temporary] (1 in 500), bleeding (1 in 200), allergic reaction [possibly serious] (1 in 200)], benefits (diagnostic support and management of coronary artery disease)  and alternatives of a cardiac catheterization were discussed in detail with Ms. Frieze and she is willing to proceed.   For questions or updates, please contact [facility] Please consult [email or web address] for contact info under Cardiology/STEMI.    Signed, Eula Listen, PA-C [facility] Pager: [phone] 11/29/2022, 8:10 AM

## 2022-11-29 NOTE — TOC CM/SW Note (Signed)
  Transition of Care [facility]) Screening Note   Patient Details  Name: Sheila [name] Date of Birth: Nov 30, 1960   Transition of Care [facility]) CM/SW Contact:    Margarito Liner, LCSW Phone Number: 11/29/2022, 10:00 AM    Transition of Care Department [facility]) has reviewed patient and no TOC needs have been identified at this time. We will continue to monitor patient advancement through interdisciplinary progression rounds. If new patient transition needs arise, please place a TOC consult.

## 2022-11-29 NOTE — Consult Note (Signed)
ANTICOAGULATION CONSULT NOTE  Pharmacy Consult for Heparin Infusion Indication: ACS/STEMI and Afib  Patient Measurements: Height:  (157.5 cm) Weight: 76.7 kg (169 lb 1.5 oz) IBW/kg (Calculated) : 50.1 Heparin Dosing Weight: 66.8 kg  Labs: Recent Labs    11/26/22 1526 11/26/22 1815 11/26/22 2309 11/27/22 0423 11/27/22 0800 11/28/22 0510 11/28/22 1306 11/28/22 2007 11/29/22 0509  HGB 15.2*  --   --  13.7  --  14.5  --   --   --   HCT 46.5*  --   --  42.5  --  45.2  --   --   --   PLT 374  --   --  348  --  354  --   --   --   APTT  --   --  55*  --   --   --   --   --   --   LABPROT  --   --  12.9  --   --   --   --   --   --   INR  --   --  1.0  --   --   --   --   --   --   HEPARINUNFRC  --   --  0.20*  --    < > 0.73* 0.61 0.46 0.54  CREATININE 0.91  --   --  0.73  --  0.83  --   --   --   TROPONINIHS 1,402* 1,473*  --  1,086*  --   --   --   --   --    < > = values in this interval not displayed.    Estimated Creatinine Clearance: 67.3 mL/min (by C-G formula based on SCr of 0.83 mg/dL).  Medical History: Past Medical History:  Diagnosis Date   Aorto-iliac atherosclerosis 01/06/2016   Asthma    COPD (chronic obstructive pulmonary disease)    Hypertension    Osteoporosis    Tobacco abuse 01/06/2016   Vitamin D deficiency 01/06/2016    Medications:  No PTA anticoagulation per pharmacist review  Assessment: 62 yo female presented with shortness of breath, light headedness, and chest discomfort.  In the ED troponin found to be elevated.  Pharmacy consulted to start heparin infusion.  Baseline labs: hgb 15.2, hct 46.5, plt = 374, aPTT 55 INR 1.0  Cardiac catheterization 4/22 showed normal coronaries  Goal of Therapy:  Heparin level 0.3-0.7 units/ml Monitor platelets by anticoagulation protocol: Yes   0419 2309 HL 0.20, subtherapeutic  0420 0800 HL=0.15 subtherapeutic 0420 1536 HL=0.31, therapeutic x 1 0420 2119 HL=0.15, subtherapeutic/1150 > 1450  units/hr 0421 0510 HL 0.73, supratherapeutic x 1, 1450>1350 u/hr 0421 1306 HL 0.61   therapeutic x1 0421 2007 HL 0.46 therapeutic x2 0422 0509 HL 0.54, therapeutic x 3  Plan: Per discussion with hospitalist Re-start heparin at previous rate of 1350 units/hr 4/22 at 1800 and continue until 4/23 at 0959  On 4/23 at 1000 start apixaban 5 mg BID for Afib Heparin level and CBC tomorrow AM  Tressie Ellis 11/29/2022 2:43 PM

## 2022-11-29 NOTE — Progress Notes (Signed)
[facility]  - [place] [facility]   PATIENT NAME: Sheila [name]    MR#:  [id number]  DATE OF BIRTH:  02/29/1961  SUBJECTIVE:  patient seen earlier. No family at bedside.  No further CP Cath in the afternoon   VITALS:  Blood pressure (!) 180/94, pulse (!) 56, temperature 97.8 F (36.6 C), resp. rate 16, height  (1.575 m), weight 76.7 kg, last menstrual period 08/09/2008, SpO2 95 %.  PHYSICAL EXAMINATION:   GENERAL:  62 y.o.-year-old patient with no acute distress.  LUNGS: Normal breath sounds bilaterally, no wheezing CARDIOVASCULAR: S1, S2 normal. No murmur   ABDOMEN: Soft, nontender, nondistended. Bowel sounds present.  EXTREMITIES: No  edema b/l.    NEUROLOGIC: nonfocal  patient is alert and awake SKIN: No obvious rash, lesion, or ulcer.   LABORATORY PANEL:  CBC Recent Labs  Lab 11/28/22 0510  WBC 11.2*  HGB 14.5  HCT 45.2  PLT 354     Chemistries  Recent Labs  Lab 11/26/22 1526 11/27/22 0423 11/28/22 0510  NA 131*   < > 142  K 3.6   < > 3.4*  CL 100   < > 110  CO2 21*   < > 22  GLUCOSE 137*   < > 89  BUN 13   < > 15  CREATININE 0.91   < > 0.83  CALCIUM 9.8   < > 10.3  MG 1.8  --   --    < > = values in this interval not displayed.    RADIOLOGY:  ECHOCARDIOGRAM COMPLETE  Result Date: 11/28/2022    ECHOCARDIOGRAM REPORT   Patient Name:   Sheila [name] Date of Exam: 11/28/2022 Medical Rec #:  [id number]       Height:       62.0 in Accession #:    [id number]      Weight:       169.0 lb Date of Birth:  January 30, 1961        BSA:          1.780 m Patient Age:    61 years        BP:           172/80 mmHg Patient Gender: F               HR:           56 bpm. Exam Location:  [facility] Procedure: 2D Echo Indications:     NSTEMI I21.4  History:         Patient has prior history of Echocardiogram examinations, most                  recent 02/28/2017.  Sonographer:     Overton Mam RDCS, FASE Referring Phys:  [id number] AMY N COX Diagnosing Phys: Debbe Odea MD  Sonographer Comments: Technically difficult study due to poor echo windows. Image acquisition challenging due to respiratory motion. IMPRESSIONS  1. Left ventricular ejection fraction, by estimation, is 55 to 60%. The left ventricle has normal function. The left ventricle has no regional wall motion abnormalities. There is mild left ventricular hypertrophy. Left ventricular diastolic parameters are consistent with Grade II diastolic dysfunction (pseudonormalization).  2. Right ventricular systolic function is normal. The right ventricular size is normal.  3. The mitral valve is normal in structure. No evidence of mitral valve regurgitation.  4. The aortic valve was not well visualized. Aortic valve regurgitation is not visualized. Aortic valve sclerosis is  present, with no evidence of aortic valve stenosis.  5. The inferior vena cava is normal in size with greater than 50% respiratory variability, suggesting right atrial pressure of 3 mmHg. FINDINGS  Left Ventricle: Left ventricular ejection fraction, by estimation, is 55 to 60%. The left ventricle has normal function. The left ventricle has no regional wall motion abnormalities. The left ventricular internal cavity size was normal in size. There is  mild left ventricular hypertrophy. Left ventricular diastolic parameters are consistent with Grade II diastolic dysfunction (pseudonormalization). Right Ventricle: The right ventricular size is normal. No increase in right ventricular wall thickness. Right ventricular systolic function is normal. Left Atrium: Left atrial size was normal in size. Right Atrium: Right atrial size was normal in size. Pericardium: There is no evidence of pericardial effusion. Mitral Valve: The mitral valve is normal in structure. No evidence of mitral valve regurgitation. Tricuspid Valve: The tricuspid valve is not well visualized. Tricuspid valve regurgitation is not demonstrated. Aortic Valve: The aortic valve was not well  visualized. Aortic valve regurgitation is not visualized. Aortic valve sclerosis is present, with no evidence of aortic valve stenosis. Aortic valve peak gradient measures 7.1 mmHg. Pulmonic Valve: The pulmonic valve was not well visualized. Pulmonic valve regurgitation is not visualized. Aorta: The aortic root and ascending aorta are structurally normal, with no evidence of dilitation. Venous: The inferior vena cava is normal in size with greater than 50% respiratory variability, suggesting right atrial pressure of 3 mmHg. IAS/Shunts: No atrial level shunt detected by color flow Doppler.  LEFT VENTRICLE PLAX 2D LVIDd:         4.50 cm   Diastology LVIDs:         3.20 cm   LV e' medial:    4.79 cm/s LV PW:         1.50 cm   LV E/e' medial:  18.3 LV IVS:        1.60 cm   LV e' lateral:   4.46 cm/s LVOT diam:     2.00 cm   LV E/e' lateral: 19.6 LV SV:         55 LV SV Index:   31 LVOT Area:     3.14 cm  RIGHT VENTRICLE RV Basal diam:  2.90 cm RV S prime:     12.40 cm/s TAPSE (M-mode): 1.6 cm LEFT ATRIUM             Index        RIGHT ATRIUM           Index LA diam:        4.60 cm 2.58 cm/m   RA Area:     18.40 cm LA Vol (A2C):   41.5 ml 23.32 ml/m  RA Volume:   47.30 ml  26.58 ml/m LA Vol (A4C):   20.8 ml 11.69 ml/m LA Biplane Vol: 30.5 ml 17.14 ml/m  AORTIC VALVE                 PULMONIC VALVE AV Area (Vmax): 1.98 cm     PV Vmax:        0.83 m/s AV Vmax:        133.00 cm/s  PV Peak grad:   2.8 mmHg AV Peak Grad:   7.1 mmHg     RVOT Peak grad: 3 mmHg LVOT Vmax:      83.80 cm/s LVOT Vmean:     57.200 cm/s LVOT VTI:       0.174 m  AORTA Ao Root diam: 2.80 cm Ao Asc diam:  2.80 cm MITRAL VALVE MV Area (PHT): 2.21 cm    SHUNTS MV Decel Time: 344 msec    Systemic VTI:  0.17 m MV E velocity: 87.50 cm/s  Systemic Diam: 2.00 cm MV A velocity: 97.50 cm/s MV E/A ratio:  0.90 Debbe Odea MD Electronically signed by Debbe Odea MD Signature Date/Time: 11/28/2022/2:08:58 PM    Final     Assessment and Plan   Sheila [name] is a 62 year old female with history of persistent tobacco use, nicotine dependence, hypertension with medication resistant hypertension, hyperlipidemia, COPD, heart failure preserved ejection fraction, who presents emergency department for chief concerns of shortness of breath, dizziness, left arm numbness.  High sensitive troponin is 1402.  ED treatment: Aspirin 324 mg p.o. one-time dose, diltiazem 50 mg IV one-time dose, heparin GTT per pharmacy ordered.  NSTEMI (non-ST elevated myocardial infarction) --Concerning for ACS given patient high risk: Hypertension, PAD, persistent tobacco use --prn nitro, heparin gtt --Cardiology has been consulted Dr. Mariah Milling --plans for [facility] -- continue Coreg and statins   Paroxysmal atrial fibrillation with RVR --New diagnosis of atrial fibrillation, found to be in RVR --Responded to diltiazem 15 mg IV one-time dose per EDP --Diltiazem 10 mg IV every 6 hours as needed for heart rate greater than 120 --on coreg --HR better --on Heparin gtt--transition to eliquis at discharge --CHADS2VASc 4   Dyspnea on effort Suspect secondary to chest pain, elevated troponin, concerning for unstable angina   Hyperlipidemia --Atorvastatin    PAD (peripheral artery disease) --Patient is on aspirin 81 mg --Patient smokes cigarettes,Counseling given   Tobacco abuse --As needed nicotine patch ordered --Patient is not ready to quit   Essential hypertension --on coreg    Procedures: Family communication :none Consults :[facility] cardiology CODE STATUS: full DVT Prophylaxis :heparin gtt Level of care: Progressive Status is: Inpatient Remains inpatient appropriate because: NSTEMI. Cath planned for today    TOTAL TIME TAKING CARE OF THIS PATIENT: 35 minutes.  >50% time spent on counselling and coordination of care  Note: This dictation was prepared with Dragon dictation along with smaller phrase technology. Any transcriptional errors that result  from this process are unintentional.  Enedina Finner M.D    [facility]   CC: Primary care physician; Girtha Rm, Eli Phillips, MD

## 2022-11-29 NOTE — Interval H&P Note (Signed)
History and Physical Interval Note:  11/29/2022 1:14 PM  Sheila [name]  has presented today for surgery, with the diagnosis of Non-ST segment myocardial infarction.  The various methods of treatment have been discussed with the patient and family. After consideration of risks, benefits and other options for treatment, the patient has consented to  Procedure(s): LEFT HEART CATH AND CORONARY ANGIOGRAPHY (N/A) as a surgical intervention.  The patient's history has been reviewed, patient examined, no change in status, stable for surgery.  I have reviewed the patient's chart and labs.  Questions were answered to the patient's satisfaction.     Lorine Bears

## 2022-11-29 NOTE — TOC Benefit Eligibility Note (Signed)
Patient Advocate Encounter  Insurance verification completed.    The patient is currently admitted and upon discharge could be taking Eliquis 5 mg.  The current 30 day co-pay is $4.00.   The patient is insured through [employer] [place] [employer]   This test claim was processed through [facility]- copay amounts may vary at other pharmacies due to pharmacy/plan contracts, or as the patient moves through the different stages of their insurance plan.  Aidah Forquer, CPHT Pharmacy Patient Advocate Specialist [facility] Patient Advocate Team Direct Number: [phone]  Fax: [phone]       

## 2022-11-30 ENCOUNTER — Other Ambulatory Visit (HOSPITAL_COMMUNITY): Payer: Self-pay

## 2022-11-30 DIAGNOSIS — I214 Non-ST elevation (NSTEMI) myocardial infarction: Secondary | ICD-10-CM | POA: Diagnosis not present

## 2022-11-30 DIAGNOSIS — I2489 Other forms of acute ischemic heart disease: Secondary | ICD-10-CM | POA: Diagnosis not present

## 2022-11-30 DIAGNOSIS — I4891 Unspecified atrial fibrillation: Secondary | ICD-10-CM | POA: Diagnosis not present

## 2022-11-30 LAB — CBC
HCT: 45.6 % (ref 36.0–46.0)
Hemoglobin: 14.5 g/dL (ref 12.0–15.0)
MCH: 30.6 pg (ref 26.0–34.0)
MCHC: 31.8 g/dL (ref 30.0–36.0)
MCV: 96.2 fL (ref 80.0–100.0)
Platelets: 316 10*3/uL (ref 150–400)
RBC: 4.74 MIL/uL (ref 3.87–5.11)
RDW: 13.4 % (ref 11.5–15.5)
WBC: 11.4 10*3/uL — ABNORMAL HIGH (ref 4.0–10.5)
nRBC: 0 % (ref 0.0–0.2)

## 2022-11-30 LAB — HEPARIN LEVEL (UNFRACTIONATED): Heparin Unfractionated: 0.67 IU/mL (ref 0.30–0.70)

## 2022-11-30 MED ORDER — APIXABAN 5 MG PO TABS: 5.0000 mg | ORAL_TABLET | Freq: Two times a day (BID) | ORAL | 3 refills | Status: DC

## 2022-11-30 MED ORDER — LOSARTAN POTASSIUM 100 MG PO TABS: 100.0000 mg | ORAL_TABLET | Freq: Every day | ORAL | 1 refills | Status: DC

## 2022-11-30 MED ORDER — CARVEDILOL 6.25 MG PO TABS: 6.2500 mg | ORAL_TABLET | Freq: Two times a day (BID) | ORAL | 1 refills | Status: DC

## 2022-11-30 MED ORDER — ATORVASTATIN CALCIUM 40 MG PO TABS: 40.0000 mg | ORAL_TABLET | Freq: Every day | ORAL | 1 refills | Status: DC

## 2022-11-30 MED ORDER — ATORVASTATIN CALCIUM 20 MG PO TABS: 40.0000 mg | ORAL_TABLET | Freq: Every day | ORAL | Status: DC

## 2022-11-30 MED ORDER — ASPIRIN 81 MG PO TBEC: 81.0000 mg | DELAYED_RELEASE_TABLET | Freq: Every day | ORAL | 12 refills | Status: AC

## 2022-11-30 MED ORDER — NICOTINE 21 MG/24HR TD PT24: 21.0000 mg | MEDICATED_PATCH | Freq: Every day | TRANSDERMAL | 0 refills | Status: DC | PRN

## 2022-11-30 NOTE — Progress Notes (Addendum)
Cardiology Progress Note   Patient Name: Sheila [name] Date of Encounter: 11/30/2022  Primary Cardiologist: Julien Nordmann, MD  Subjective   Feels well this morning.  No chest pain, dyspnea.  Right wrist feels well.  No recurrent atrial fibrillation.  Eager to go home.  Inpatient Medications    Scheduled Meds:  apixaban  5 mg Oral BID   aspirin EC  81 mg Oral Daily   atorvastatin  10 mg Oral QHS   carvedilol  12.5 mg Oral BID WC   losartan  100 mg Oral Daily   pantoprazole  80 mg Oral Daily   sodium chloride flush  3 mL Intravenous Q12H   Continuous Infusions:  sodium chloride     PRN Meds: sodium chloride, acetaminophen **OR** acetaminophen, ALPRAZolam, hydrALAZINE, nicotine, ondansetron **OR** ondansetron (ZOFRAN) IV, senna-docusate, sodium chloride flush   Vital Signs    Vitals:   11/29/22 2310 11/30/22 0400 11/30/22 0920 11/30/22 1103  BP: (!) 142/66 138/66 (!) 160/77 (!) 148/78  Pulse: 70 72 64 61  Resp: 17 17 18 16   Temp: 98.3 F (36.8 C) 98.3 F (36.8 C) 97.8 F (36.6 C) 98.2 F (36.8 C)  TempSrc: Oral Oral    SpO2: 97% 97% 98% 96%  Weight:      Height:        Intake/Output Summary (Last 24 hours) at 11/30/2022 1258 Last data filed at 11/30/2022 1014 Gross per 24 hour  Intake 720 ml  Output --  Net 720 ml   Filed Weights   11/26/22 1624 11/29/22 1234  Weight: 76.7 kg 76.7 kg    Physical Exam   GEN: Well nourished, well developed, in no acute distress.  HEENT: Grossly normal.  Neck: Supple, no JVD, carotid bruits, or masses. Cardiac: RRR, no murmurs, rubs, or gallops. No clubbing, cyanosis, edema.  Radials 2+, DP/PT 2+ and equal bilaterally.  Right radial catheterization site without bleeding, bruit, or hematoma. Respiratory:  Respirations regular and unlabored, clear to auscultation bilaterally. GI: Soft, nontender, nondistended, BS + x 4. MS: no deformity or atrophy. Skin: warm and dry, no rash. Neuro:  Strength and sensation are  intact. Psych: AAOx3.  Normal affect.  Labs    Chemistry Recent Labs  Lab 11/26/22 1526 11/27/22 0423 11/28/22 0510  NA 131* 140 142  K 3.6 3.7 3.4*  CL 100 112* 110  CO2 21* 22 22  GLUCOSE 137* 92 89  BUN 13 11 15   CREATININE 0.91 0.73 0.83  CALCIUM 9.8 9.7 10.3  GFRNONAA >60 >60 >60  ANIONGAP 10 6 10      Hematology Recent Labs  Lab 11/27/22 0423 11/28/22 0510 11/30/22 0333  WBC 12.7* 11.2* 11.4*  RBC 4.44 4.71 4.74  HGB 13.7 14.5 14.5  HCT 42.5 45.2 45.6  MCV 95.7 96.0 96.2  MCH 30.9 30.8 30.6  MCHC 32.2 32.1 31.8  RDW 13.5 13.4 13.4  PLT 348 354 316    Cardiac Enzymes  Recent Labs  Lab 11/26/22 1526 11/26/22 1815 11/27/22 0423  TROPONINIHS 1,402* 1,473* 1,086*      BNP    Component Value Date/Time   BNP 328.0 (H) 12/04/2015 1430   DDimer  Recent Labs  Lab 11/26/22 1526  DDIMER 0.40     Lipids  Lab Results  Component Value Date   CHOL 193 11/27/2022   HDL 39 (L) 11/27/2022   LDLCALC 116 (H) 11/27/2022   TRIG 188 (H) 11/27/2022   CHOLHDL 4.9 11/27/2022   HbA1c  Lab Results  Component Value Date   HGBA1C 5.9 (H) 11/27/2022    Radiology    DG Chest Port 1 View  Result Date: 11/26/2022 CLINICAL DATA:  Chest pain EXAM: PORTABLE CHEST 1 VIEW COMPARISON:  X-ray 02/26/2017.  CT 01/30/2018 FINDINGS: Overlapping cardiac leads and defibrillator pads. Enlarged cardiopericardial silhouette with some vascular congestion. No consolidation, pneumothorax or effusion. IMPRESSION: Enlarged cardiopericardial silhouette with some vascular congestion. Electronically Signed   By: Karen Kays M.D.   On: 11/26/2022 15:57    Telemetry    RSR 2 sinus bradycardia with up to 2-second pauses overnight- Personally Reviewed  Cardiac Studies   Cardiac Catheterization  4.22.2024  Left Main  Vessel is angiographically normal.    Left Anterior Descending    First Diagonal Branch  Vessel is large in size. Vessel is angiographically normal.    Second  Diagonal Branch  Vessel is angiographically normal.    Third Diagonal Branch  Vessel is small in size. Vessel is angiographically normal.    Left Circumflex  Vessel is angiographically normal.    Right Coronary Artery  Vessel is angiographically normal.    Right Posterior Descending Artery  Vessel is angiographically normal.    Right Posterior Atrioventricular Artery  Vessel is angiographically normal.    Intervention    _____________   2D Echocardiogram 4.21.2024  1. Left ventricular ejection fraction, by estimation, is 55 to 60%. The  left ventricle has normal function. The left ventricle has no regional  wall motion abnormalities. There is mild left ventricular hypertrophy.  Left ventricular diastolic parameters  are consistent with Grade II diastolic dysfunction (pseudonormalization).   2. Right ventricular systolic function is normal. The right ventricular  size is normal.   3. The mitral valve is normal in structure. No evidence of mitral valve  regurgitation.   4. The aortic valve was not well visualized. Aortic valve regurgitation  is not visualized. Aortic valve sclerosis is present, with no evidence of  aortic valve stenosis.   5. The inferior vena cava is normal in size with greater than 50%  respiratory variability, suggesting right atrial pressure of 3 mmHg.  _____________   Patient Profile     62 y.o. female w/ a h/o PAD and subclavian stenosis s/p stenting in 2019, hypertension, and COPD who was admitted April 19 with rapid atrial fibrillation and non-STEMI.  Diagnostic catheterization showed normal coronary arteries.  Assessment & Plan    1.  Non-STEMI: Patient presented April 19 with rapid atrial fibrillation and finding of troponin up to 1473.  Echo showed normal LV function with grade 2 diastolic dysfunction and no significant valvular disease.  Diagnostic catheterization April 22 showed normal coronary arteries.  She feels well this morning without  chest pain or dyspnea.  She has been maintaining sinus rhythm.  Troponin elevation felt to be secondary to demand ischemia in the setting of rapid atrial fibrillation and severely elevated systolic blood pressure.  Continue beta-blocker, ARB, and statin therapy.  Currently on ASA and eliquis.  2.  Paroxysmal atrial fibrillation: Patient presented in rapid atrial fibrillation, which converted on the day of admission.  She has been maintaining sinus rhythm on beta-blocker therapy.  CHA2DS2-VASc equals 4.  Eliquis started this morning.  Eliquis education provided by pharmacy staff.  She will need outpatient sleep study.  3.  Essential hypertension: Blood pressure generally trending in the 100-160 range.  Losartan was increased to 100 mg daily this morning.  Continue current dose of carvedilol.  Will reassess in the  outpatient setting on higher dose of ARB and consider transitioning to an alternate ARB, such as valsartan, or adding amlodipine if pressures remain elevated.  4.  Hyperlipidemia: Continue atorvastatin therapy.  5.  Tobacco abuse: Complete cessation advised.  6.  PAD:  s/p subclavian stenting.  ASA/statin.  Signed, Nicolasa Ducking, NP  11/30/2022, 12:58 PM    For questions or updates, please contact   Please consult [email or web address] for contact info under Cardiology/STEMI.

## 2022-11-30 NOTE — Discharge Instructions (Signed)
Smoking cessation advised.

## 2022-11-30 NOTE — Consult Note (Signed)
ANTICOAGULATION CONSULT NOTE  Pharmacy Consult for Heparin Infusion Indication: ACS/STEMI and Afib  Patient Measurements: Height:  (157.5 cm) Weight: 76.7 kg (169 lb 1.5 oz) IBW/kg (Calculated) : 50.1 Heparin Dosing Weight: 66.8 kg  Labs: Recent Labs    11/28/22 0510 11/28/22 1306 11/28/22 2007 11/29/22 0509 11/30/22 0333  HGB 14.5  --   --   --  14.5  HCT 45.2  --   --   --  45.6  PLT 354  --   --   --  316  HEPARINUNFRC 0.73*   < > 0.46 0.54 0.67  CREATININE 0.83  --   --   --   --    < > = values in this interval not displayed.    Estimated Creatinine Clearance: 67.3 mL/min (by C-G formula based on SCr of 0.83 mg/dL).  Medical History: Past Medical History:  Diagnosis Date   Aorto-iliac atherosclerosis 01/06/2016   Asthma    COPD (chronic obstructive pulmonary disease)    Hypertension    Osteoporosis    Tobacco abuse 01/06/2016   Vitamin D deficiency 01/06/2016    Medications:  No PTA anticoagulation per pharmacist review  Assessment: 62 yo female presented with shortness of breath, light headedness, and chest discomfort.  In the ED troponin found to be elevated.  Pharmacy consulted to start heparin infusion.  Baseline labs: hgb 15.2, hct 46.5, plt = 374, aPTT 55 INR 1.0  Cardiac catheterization 4/22 showed normal coronaries  Goal of Therapy:  Heparin level 0.3-0.7 units/ml Monitor platelets by anticoagulation protocol: Yes   0419 2309 HL 0.20, subtherapeutic  0420 0800 HL=0.15 subtherapeutic 0420 1536 HL=0.31, therapeutic x 1 0420 2119 HL=0.15, subtherapeutic/1150 > 1450 units/hr 0421 0510 HL 0.73, supratherapeutic x 1, 1450>1350 u/hr 0421 1306 HL 0.61   therapeutic x1 0421 2007 HL 0.46 therapeutic x2 0422 0509 HL 0.54, therapeutic x 3 0423 0333 HL 0.67, therapeutic X 4   Plan: Per discussion with hospitalist Re-start heparin at previous rate of 1350 units/hr 4/22 at 1800 and continue until 4/23 at 0959  On 4/23 at 1000 start apixaban 5 mg BID  for Afib Heparin level and CBC tomorrow AM  4/23: HL @ 0333 = 0.67, therapeutic X 4 - Will continue pt on current rate until 1000 when Eliquis 5 mg PO BID will start  Gamaliel Charney D 11/30/2022 5:16 AM

## 2022-11-30 NOTE — Discharge Summary (Signed)
Physician Discharge Summary   Patient: Sheila [name] MRN: [id number] DOB: 1960/10/23  Admit date:     11/26/2022  Discharge date: 11/30/22  Discharge Physician: Enedina Finner   PCP: Alease Medina, MD   Recommendations at discharge:    F/u Dr Kirke Corin in 1-3 weeks Stop smoking! F/u PCP in 1-2 weeks  Discharge Diagnoses: Principal Problem:   NSTEMI (non-ST elevated myocardial infarction) Active Problems:   Essential hypertension   COPD (chronic obstructive pulmonary disease)   Tobacco abuse   Nonspecific chest pain   GERD (gastroesophageal reflux disease)   Carotid stenosis   PAD (peripheral artery disease)   Cerebellar ataxia in diseases classified elsewhere   Hyperlipidemia   Dyspnea on effort   Atrial fibrillation with rapid ventricular response   Troponin level elevated   Atrial fibrillation, new onset   Sheila [name] is a 62 year old female with history of persistent tobacco use, nicotine dependence, hypertension with medication resistant hypertension, hyperlipidemia, COPD, heart failure preserved ejection fraction, who presents emergency department for chief concerns of shortness of breath, dizziness, left arm numbness.  High sensitive troponin is 1402.  ED treatment: Aspirin 324 mg p.o. one-time dose, diltiazem 50 mg IV one-time dose, heparin GTT per pharmacy ordered.   NSTEMI (non-ST elevated myocardial infarction) --Concerning for ACS given patient high risk: Hypertension, PAD, persistent tobacco use --prn nitro, heparin gtt --Cardiology has been consulted Dr. Mariah Milling --plans for [facility]  -- continue Coreg, losartan and statins --LHC 1.  Normal coronary arteries. 2.  Left ventricular angiography was not performed.  EF was normal by echo. 3.  Severely elevated systolic blood pressure with mildly elevated left ventricular end-diastolic pressure.  Recommendations: Suspect that elevated troponin is due to type II myocardial infarction due to A-fib with RVR and severely  elevated blood pressure. Heparin drip to be resumed at 6 PM.  This can be transition to Eliquis tomorrow. Continue blood pressure control.  I increased both carvedilol and losartan. --pt now on po eliquis   Paroxysmal atrial fibrillation with RVR --New diagnosis of atrial fibrillation, found to be in RVR --Responded to diltiazem 15 mg IV one-time dose per EDP --Diltiazem 10 mg IV every 6 hours as needed for heart rate greater than 120 --on coreg --HR better --on Heparin gtt--transitioned to eliquis at discharge --CHADS2VASc 4   Dyspnea on effort Suspect secondary to chest pain, elevated troponin, concerning for unstable angina   Hyperlipidemia --Atorvastatin    PAD (peripheral artery disease) --Patient is on aspirin 81 mg --Patient smokes cigarettes,Counseling given   Tobacco abuse --As needed nicotine patch ordered --Patient is not ready to quit   Essential hypertension --on coreg and Losartan   Ok from Cardiology Dr end for d/c . Pt agreeable   Procedures: Family communication :none Consults :[facility] cardiology CODE STATUS: full     Pain control - [employer] Controlled Substance Reporting System database was reviewed. and patient was instructed, not to drive, operate heavy machinery, perform activities at heights, swimming or participation in water activities or provide baby-sitting services while on Pain, Sleep and Anxiety Medications; until their outpatient Physician has advised to do so again. Also recommended to not to take more than prescribed Pain, Sleep and Anxiety Medications.  Diet recommendation:  Discharge Diet Orders (From admission, onward)     Start     Ordered   11/30/22 0000  Diet - low sodium heart healthy        11/30/22 1359  DISCHARGE MEDICATION: Allergies as of 11/30/2022       Reactions   Peach Flavor Shortness Of Breath   Vicodin [hydrocodone-acetaminophen] Nausea And Vomiting   Vicodin [hydrocodone-acetaminophen]    Abd  pain, N/V   Hct [hydrochlorothiazide] Palpitations   Tramadol Palpitations        Medication List     TAKE these medications    acetaminophen 500 MG tablet Commonly known as: TYLENOL Take 500-1,000 mg by mouth every 6 (six) hours as needed for mild pain or moderate pain.   ALPRAZolam 0.5 MG tablet Commonly known as: XANAX Take 0.5 mg by mouth 4 (four) times daily as needed for anxiety.   apixaban 5 MG Tabs tablet Commonly known as: ELIQUIS Take 1 tablet (5 mg total) by mouth 2 (two) times daily.   aspirin EC 81 MG tablet Take 1 tablet (81 mg total) by mouth daily. Swallow whole. Start taking on: December 01, 2022 What changed:  how much to take when to take this additional instructions   atorvastatin 40 MG tablet Commonly known as: LIPITOR Take 1 tablet (40 mg total) by mouth at bedtime. What changed:  medication strength how much to take when to take this   carvedilol 6.25 MG tablet Commonly known as: COREG Take 1 tablet (6.25 mg total) by mouth 2 (two) times daily with a meal. What changed:  medication strength how much to take   losartan 100 MG tablet Commonly known as: COZAAR Take 1 tablet (100 mg total) by mouth daily. Start taking on: December 01, 2022   nicotine 21 mg/24hr patch Commonly known as: NICODERM CQ - dosed in mg/24 hours Place 1 patch (21 mg total) onto the skin daily as needed (nicotine craving).   omeprazole 40 MG capsule Commonly known as: PRILOSEC Take 40 mg by mouth daily.   oxyCODONE-acetaminophen 7.5-325 MG tablet Commonly known as: PERCOCET Take 1 tablet by mouth every 8 (eight) hours as needed for severe pain.        Follow-up Information     Ziglar, Eli Phillips, MD. Schedule an appointment as soon as possible for a visit in 1 week(s).   Specialty: Family Medicine Why: hospital f/u Contact information: [address] [place] [address] [phone]         Iran Ouch, MD. Schedule an appointment as soon as possible  for a visit in 1 week(s).   Specialty: Cardiology Contact information: [address] [place] [address] [phone]                Discharge Exam: Ceasar Mons Weights   11/26/22 1624 11/29/22 1234  Weight: 76.7 kg 76.7 kg   GENERAL:  62 y.o.-year-old patient with no acute distress.  LUNGS: Normal breath sounds bilaterally, no wheezing CARDIOVASCULAR: S1, S2 normal. No murmur   ABDOMEN: Soft, nontender, nondistended. Bowel sounds present.  EXTREMITIES: No  edema b/l.    NEUROLOGIC: nonfocal  patient is alert and awake SKIN: No obvious rash, lesion, or ulcer.   Condition at discharge: fair  The results of significant diagnostics from this hospitalization (including imaging, microbiology, ancillary and laboratory) are listed below for reference.   Imaging Studies: CARDIAC CATHETERIZATION  Result Date: 11/29/2022 1.  Normal coronary arteries. 2.  Left ventricular angiography was not performed.  EF was normal by echo. 3.  Severely elevated systolic blood pressure with mildly elevated left ventricular end-diastolic pressure. Recommendations: Suspect that elevated troponin is due to type II myocardial infarction due to A-fib with  RVR and severely elevated blood pressure. Heparin drip to be resumed at 6 PM.  This can be transition to Eliquis tomorrow. Continue blood pressure control.  I increased both carvedilol and losartan.   ECHOCARDIOGRAM COMPLETE  Result Date: 11/28/2022    ECHOCARDIOGRAM REPORT   Patient Name:   Sheila [name] Date of Exam: 11/28/2022 Medical Rec #:  [id number]       Height:       62.0 in Accession #:    [id number]      Weight:       169.0 lb Date of Birth:  [birth year]        BSA:          1.780 m Patient Age:    61 years        BP:           172/80 mmHg Patient Gender: F               HR:           56 bpm. Exam Location:  [facility] Procedure: 2D Echo Indications:     NSTEMI I21.4  History:         Patient has prior history of Echocardiogram examinations,  most                  recent 02/28/2017.  Sonographer:     Overton Mam RDCS, FASE Referring Phys:  [id number] AMY N COX Diagnosing Phys: Debbe Odea MD  Sonographer Comments: Technically difficult study due to poor echo windows. Image acquisition challenging due to respiratory motion. IMPRESSIONS  1. Left ventricular ejection fraction, by estimation, is 55 to 60%. The left ventricle has normal function. The left ventricle has no regional wall motion abnormalities. There is mild left ventricular hypertrophy. Left ventricular diastolic parameters are consistent with Grade II diastolic dysfunction (pseudonormalization).  2. Right ventricular systolic function is normal. The right ventricular size is normal.  3. The mitral valve is normal in structure. No evidence of mitral valve regurgitation.  4. The aortic valve was not well visualized. Aortic valve regurgitation is not visualized. Aortic valve sclerosis is present, with no evidence of aortic valve stenosis.  5. The inferior vena cava is normal in size with greater than 50% respiratory variability, suggesting right atrial pressure of 3 mmHg. FINDINGS  Left Ventricle: Left ventricular ejection fraction, by estimation, is 55 to 60%. The left ventricle has normal function. The left ventricle has no regional wall motion abnormalities. The left ventricular internal cavity size was normal in size. There is  mild left ventricular hypertrophy. Left ventricular diastolic parameters are consistent with Grade II diastolic dysfunction (pseudonormalization). Right Ventricle: The right ventricular size is normal. No increase in right ventricular wall thickness. Right ventricular systolic function is normal. Left Atrium: Left atrial size was normal in size. Right Atrium: Right atrial size was normal in size. Pericardium: There is no evidence of pericardial effusion. Mitral Valve: The mitral valve is normal in structure. No evidence of mitral valve regurgitation. Tricuspid  Valve: The tricuspid valve is not well visualized. Tricuspid valve regurgitation is not demonstrated. Aortic Valve: The aortic valve was not well visualized. Aortic valve regurgitation is not visualized. Aortic valve sclerosis is present, with no evidence of aortic valve stenosis. Aortic valve peak gradient measures 7.1 mmHg. Pulmonic Valve: The pulmonic valve was not well visualized. Pulmonic valve regurgitation is not visualized. Aorta: The aortic root and ascending aorta are structurally normal, with no evidence of dilitation. Venous: The inferior  vena cava is normal in size with greater than 50% respiratory variability, suggesting right atrial pressure of 3 mmHg. IAS/Shunts: No atrial level shunt detected by color flow Doppler.  LEFT VENTRICLE PLAX 2D LVIDd:         4.50 cm   Diastology LVIDs:         3.20 cm   LV e' medial:    4.79 cm/s LV PW:         1.50 cm   LV E/e' medial:  18.3 LV IVS:        1.60 cm   LV e' lateral:   4.46 cm/s LVOT diam:     2.00 cm   LV E/e' lateral: 19.6 LV SV:         55 LV SV Index:   31 LVOT Area:     3.14 cm  RIGHT VENTRICLE RV Basal diam:  2.90 cm RV S prime:     12.40 cm/s TAPSE (M-mode): 1.6 cm LEFT ATRIUM             Index        RIGHT ATRIUM           Index LA diam:        4.60 cm 2.58 cm/m   RA Area:     18.40 cm LA Vol (A2C):   41.5 ml 23.32 ml/m  RA Volume:   47.30 ml  26.58 ml/m LA Vol (A4C):   20.8 ml 11.69 ml/m LA Biplane Vol: 30.5 ml 17.14 ml/m  AORTIC VALVE                 PULMONIC VALVE AV Area (Vmax): 1.98 cm     PV Vmax:        0.83 m/s AV Vmax:        133.00 cm/s  PV Peak grad:   2.8 mmHg AV Peak Grad:   7.1 mmHg     RVOT Peak grad: 3 mmHg LVOT Vmax:      83.80 cm/s LVOT Vmean:     57.200 cm/s LVOT VTI:       0.174 m  AORTA Ao Root diam: 2.80 cm Ao Asc diam:  2.80 cm MITRAL VALVE MV Area (PHT): 2.21 cm    SHUNTS MV Decel Time: 344 msec    Systemic VTI:  0.17 m MV E velocity: 87.50 cm/s  Systemic Diam: 2.00 cm MV A velocity: 97.50 cm/s MV E/A ratio:  0.90  Debbe Odea MD Electronically signed by Debbe Odea MD Signature Date/Time: 11/28/2022/2:08:58 PM    Final    DG Chest Port 1 View  Result Date: 11/26/2022 CLINICAL DATA:  Chest pain EXAM: PORTABLE CHEST 1 VIEW COMPARISON:  X-ray 02/26/2017.  CT 01/30/2018 FINDINGS: Overlapping cardiac leads and defibrillator pads. Enlarged cardiopericardial silhouette with some vascular congestion. No consolidation, pneumothorax or effusion. IMPRESSION: Enlarged cardiopericardial silhouette with some vascular congestion. Electronically Signed   By: Karen Kays M.D.   On: 11/26/2022 15:57    Microbiology: Results for orders placed or performed in visit on 10/05/18  Urine Culture     Status: Abnormal   Collection Time: 10/05/18 12:29 PM   BLD  Result Value Ref Range Status   Urine Culture, Routine Final report (A)  Final   Organism ID, Bacteria Klebsiella pneumoniae (A)  Final    Comment: Greater than 100,000 colony forming units per mL Cefazolin <=4 ug/mL Cefazolin with an MIC <=16 predicts susceptibility to the oral agents cefaclor, cefdinir, cefpodoxime, cefprozil, cefuroxime,  cephalexin, and loracarbef when used for therapy of uncomplicated urinary tract infections due to E. coli, Klebsiella pneumoniae, and Proteus mirabilis.    ORGANISM ID, BACTERIA Proteus mirabilis (A)  Final    Comment: Greater than 100,000 colony forming units per mL Cefazolin <=4 ug/mL Cefazolin with an MIC <=16 predicts susceptibility to the oral agents cefaclor, cefdinir, cefpodoxime, cefprozil, cefuroxime, cephalexin, and loracarbef when used for therapy of uncomplicated urinary tract infections due to E. coli, Klebsiella pneumoniae, and Proteus mirabilis.    Antimicrobial Susceptibility Comment  Final    Comment:       ** S = Susceptible; I = Intermediate; R = Resistant **                    P = Positive; N = Negative             MICS are expressed in micrograms per mL    Antibiotic                 RSLT#1     RSLT#2    RSLT#3    RSLT#4 Amoxicillin/Clavulanic Acid    S         S Ampicillin                     R         S Cefepime                       S         S Ceftriaxone                    S         S Cefuroxime                     S         S Ciprofloxacin                  S         S Ertapenem                      S         S Gentamicin                     S         R Imipenem                       S Levofloxacin                   S         S Meropenem                      S         S Nitrofurantoin                 S         R Piperacillin/Tazobactam        S         S Tetracycline                   S         R Tobramycin  S         R Trimethoprim/Sulfa             S         S     Labs: CBC: Recent Labs  Lab 11/26/22 1526 11/27/22 0423 11/28/22 0510 11/30/22 0333  WBC 13.8* 12.7* 11.2* 11.4*  HGB 15.2* 13.7 14.5 14.5  HCT 46.5* 42.5 45.2 45.6  MCV 94.3 95.7 96.0 96.2  PLT 374 348 354 316   Basic Metabolic Panel: Recent Labs  Lab 11/26/22 1526 11/27/22 0423 11/28/22 0510  NA 131* 140 142  K 3.6 3.7 3.4*  CL 100 112* 110  CO2 21* 22 22  GLUCOSE 137* 92 89  BUN CREATININE 0.91 0.73 0.83  CALCIUM 9.8 9.7 10.3  MG 1.8  --   --     Discharge time spent: greater than 30 minutes.  Signed: Enedina Finner, MD [facility] 11/30/2022

## 2022-12-02 LAB — LIPOPROTEIN A (LPA): Lipoprotein (a): 13 nmol/L (ref <75.0)

## 2022-12-10 ENCOUNTER — Encounter (HOSPITAL_COMMUNITY): Payer: Self-pay

## 2022-12-10 ENCOUNTER — Encounter: Payer: Self-pay | Admitting: Emergency Medicine

## 2022-12-10 ENCOUNTER — Other Ambulatory Visit: Payer: Self-pay

## 2022-12-10 ENCOUNTER — Emergency Department: Payer: Medicaid Other

## 2022-12-10 ENCOUNTER — Inpatient Hospital Stay
Admission: EM | Admit: 2022-12-10 | Discharge: 2022-12-11 | DRG: 282 | Disposition: A | Discharge status: Other Acute Inpatient Hospital | Payer: Medicaid Other | Attending: Internal Medicine | Admitting: Internal Medicine

## 2022-12-10 DIAGNOSIS — I21A1 Myocardial infarction type 2: Principal | ICD-10-CM | POA: Diagnosis present

## 2022-12-10 DIAGNOSIS — E876 Hypokalemia: Secondary | ICD-10-CM | POA: Diagnosis present

## 2022-12-10 DIAGNOSIS — Z7901 Long term (current) use of anticoagulants: Secondary | ICD-10-CM | POA: Diagnosis not present

## 2022-12-10 DIAGNOSIS — R000 Tachycardia, unspecified: Secondary | ICD-10-CM | POA: Diagnosis not present

## 2022-12-10 DIAGNOSIS — Z7982 Long term (current) use of aspirin: Secondary | ICD-10-CM | POA: Diagnosis not present

## 2022-12-10 DIAGNOSIS — Z720 Tobacco use: Secondary | ICD-10-CM | POA: Diagnosis present

## 2022-12-10 DIAGNOSIS — I959 Hypotension, unspecified: Secondary | ICD-10-CM | POA: Diagnosis present

## 2022-12-10 DIAGNOSIS — Z79899 Other long term (current) drug therapy: Secondary | ICD-10-CM

## 2022-12-10 DIAGNOSIS — I10 Essential (primary) hypertension: Secondary | ICD-10-CM | POA: Diagnosis present

## 2022-12-10 DIAGNOSIS — Z9102 Food additives allergy status: Secondary | ICD-10-CM | POA: Diagnosis not present

## 2022-12-10 DIAGNOSIS — Z8249 Family history of ischemic heart disease and other diseases of the circulatory system: Secondary | ICD-10-CM | POA: Diagnosis not present

## 2022-12-10 DIAGNOSIS — I700 Atherosclerosis of aorta: Secondary | ICD-10-CM | POA: Diagnosis present

## 2022-12-10 DIAGNOSIS — M810 Age-related osteoporosis without current pathological fracture: Secondary | ICD-10-CM | POA: Diagnosis present

## 2022-12-10 DIAGNOSIS — I480 Paroxysmal atrial fibrillation: Secondary | ICD-10-CM | POA: Diagnosis present

## 2022-12-10 DIAGNOSIS — F17210 Nicotine dependence, cigarettes, uncomplicated: Secondary | ICD-10-CM | POA: Diagnosis present

## 2022-12-10 DIAGNOSIS — J4489 Other specified chronic obstructive pulmonary disease: Secondary | ICD-10-CM | POA: Diagnosis present

## 2022-12-10 DIAGNOSIS — I252 Old myocardial infarction: Secondary | ICD-10-CM

## 2022-12-10 DIAGNOSIS — I4891 Unspecified atrial fibrillation: Secondary | ICD-10-CM

## 2022-12-10 DIAGNOSIS — Z825 Family history of asthma and other chronic lower respiratory diseases: Secondary | ICD-10-CM

## 2022-12-10 DIAGNOSIS — Z885 Allergy status to narcotic agent status: Secondary | ICD-10-CM | POA: Diagnosis not present

## 2022-12-10 DIAGNOSIS — J411 Mucopurulent chronic bronchitis: Secondary | ICD-10-CM | POA: Diagnosis present

## 2022-12-10 DIAGNOSIS — I708 Atherosclerosis of other arteries: Secondary | ICD-10-CM | POA: Diagnosis present

## 2022-12-10 DIAGNOSIS — I214 Non-ST elevation (NSTEMI) myocardial infarction: Secondary | ICD-10-CM | POA: Diagnosis present

## 2022-12-10 DIAGNOSIS — Z888 Allergy status to other drugs, medicaments and biological substances status: Secondary | ICD-10-CM | POA: Diagnosis not present

## 2022-12-10 DIAGNOSIS — I471 Supraventricular tachycardia, unspecified: Secondary | ICD-10-CM | POA: Diagnosis present

## 2022-12-10 LAB — BASIC METABOLIC PANEL
Anion gap: 11 (ref 5–15)
BUN: 12 mg/dL (ref 8–23)
CO2: 20 mmol/L — ABNORMAL LOW (ref 22–32)
Calcium: 9.5 mg/dL (ref 8.9–10.3)
Chloride: 107 mmol/L (ref 98–111)
Creatinine, Ser: 0.79 mg/dL (ref 0.44–1.00)
GFR, Estimated: >60 mL/min (ref >60)
Glucose, Bld: 146 mg/dL — ABNORMAL HIGH (ref 70–99)
Potassium: 2.9 mmol/L — ABNORMAL LOW (ref 3.5–5.1)
Sodium: 138 mmol/L (ref 135–145)

## 2022-12-10 LAB — HEPATIC FUNCTION PANEL
ALT: 16 U/L (ref 0–44)
AST: 21 U/L (ref 15–41)
Albumin: 3.6 g/dL (ref 3.5–5.0)
Alkaline Phosphatase: 64 U/L (ref 38–126)
Bilirubin, Direct: 0.2 mg/dL (ref 0.0–0.2)
Indirect Bilirubin: 0.6 mg/dL (ref 0.3–0.9)
Total Bilirubin: 0.8 mg/dL (ref 0.3–1.2)
Total Protein: 6.4 g/dL — ABNORMAL LOW (ref 6.5–8.1)

## 2022-12-10 LAB — CBC
HCT: 43.6 % (ref 36.0–46.0)
Hemoglobin: 13.9 g/dL (ref 12.0–15.0)
MCH: 31.2 pg (ref 26.0–34.0)
MCHC: 31.9 g/dL (ref 30.0–36.0)
MCV: 97.8 fL (ref 80.0–100.0)
Platelets: 324 10*3/uL (ref 150–400)
RBC: 4.46 MIL/uL (ref 3.87–5.11)
RDW: 13.4 % (ref 11.5–15.5)
WBC: 18.9 10*3/uL — ABNORMAL HIGH (ref 4.0–10.5)
nRBC: 0 % (ref 0.0–0.2)

## 2022-12-10 LAB — TROPONIN I (HIGH SENSITIVITY)
Troponin I (High Sensitivity): 109 ng/L (ref <18)
Troponin I (High Sensitivity): 342 ng/L (ref <18)

## 2022-12-10 LAB — MAGNESIUM: Magnesium: 1.8 mg/dL (ref 1.7–2.4)

## 2022-12-10 LAB — TSH: TSH: 2.391 u[IU]/mL (ref 0.350–4.500)

## 2022-12-10 MED ORDER — AMIODARONE IV BOLUS ONLY 150 MG/100ML
150.0000 mg | Freq: Once | INTRAVENOUS | Status: DC
  Filled 2022-12-10: qty 100

## 2022-12-10 MED ORDER — AZITHROMYCIN 500 MG PO TABS
500.0000 mg | ORAL_TABLET | Freq: Every day | ORAL | Status: AC
  Administered 2022-12-10: 500 mg via ORAL
  Filled 2022-12-10: qty 1

## 2022-12-10 MED ORDER — AMIODARONE HCL IN DEXTROSE 360-4.14 MG/200ML-% IV SOLN
30.0000 mg/h | INTRAVENOUS | Status: DC
  Administered 2022-12-10: 30 mg/h via INTRAVENOUS
  Filled 2022-12-10: qty 200

## 2022-12-10 MED ORDER — OXYCODONE-ACETAMINOPHEN 7.5-325 MG PO TABS: 1.0000 | ORAL_TABLET | Freq: Three times a day (TID) | ORAL | Status: DC | PRN

## 2022-12-10 MED ORDER — POTASSIUM CHLORIDE 10 MEQ/100ML IV SOLN
10.0000 meq | INTRAVENOUS | Status: AC
  Administered 2022-12-10 (×3): 10 meq via INTRAVENOUS
  Filled 2022-12-10 (×3): qty 100

## 2022-12-10 MED ORDER — MIDAZOLAM HCL 2 MG/2ML IJ SOLN
INTRAMUSCULAR | Status: AC
  Filled 2022-12-10: qty 2

## 2022-12-10 MED ORDER — POTASSIUM CHLORIDE CRYS ER 20 MEQ PO TBCR
40.0000 meq | EXTENDED_RELEASE_TABLET | Freq: Once | ORAL | Status: AC
  Administered 2022-12-10: 40 meq via ORAL
  Filled 2022-12-10: qty 2

## 2022-12-10 MED ORDER — CARVEDILOL 6.25 MG PO TABS: 6.2500 mg | ORAL_TABLET | Freq: Two times a day (BID) | ORAL | Status: DC

## 2022-12-10 MED ORDER — LOSARTAN POTASSIUM 50 MG PO TABS: 100.0000 mg | ORAL_TABLET | Freq: Every day | ORAL | Status: DC

## 2022-12-10 MED ORDER — SODIUM CHLORIDE 0.9% FLUSH
3.0000 mL | INTRAVENOUS | Status: DC | PRN
  Administered 2022-12-10: 3 mL via INTRAVENOUS

## 2022-12-10 MED ORDER — ONDANSETRON HCL 4 MG PO TABS: 4.0000 mg | ORAL_TABLET | Freq: Four times a day (QID) | ORAL | Status: DC | PRN

## 2022-12-10 MED ORDER — MIDAZOLAM HCL 2 MG/2ML IJ SOLN: 2.0000 mg | Freq: Once | INTRAMUSCULAR | Status: DC

## 2022-12-10 MED ORDER — MAGNESIUM SULFATE 2 GM/50ML IV SOLN
2.0000 g | Freq: Once | INTRAVENOUS | Status: AC
  Administered 2022-12-10: 2 g via INTRAVENOUS
  Filled 2022-12-10: qty 50

## 2022-12-10 MED ORDER — NOREPINEPHRINE 4 MG/250ML-% IV SOLN
INTRAVENOUS | Status: AC
  Filled 2022-12-10: qty 250

## 2022-12-10 MED ORDER — SODIUM CHLORIDE 0.9% FLUSH
3.0000 mL | Freq: Two times a day (BID) | INTRAVENOUS | Status: DC
  Administered 2022-12-10: 3 mL via INTRAVENOUS

## 2022-12-10 MED ORDER — NICOTINE POLACRILEX 2 MG MT GUM: 2.0000 mg | CHEWING_GUM | OROMUCOSAL | Status: DC | PRN

## 2022-12-10 MED ORDER — AMIODARONE IV BOLUS ONLY 150 MG/100ML: 150.0000 mg | Freq: Once | INTRAVENOUS | Status: DC

## 2022-12-10 MED ORDER — ASPIRIN 81 MG PO TBEC: 81.0000 mg | DELAYED_RELEASE_TABLET | Freq: Every day | ORAL | Status: DC

## 2022-12-10 MED ORDER — AMIODARONE HCL 150 MG/3ML IV SOLN
INTRAVENOUS | Status: DC | PRN
  Administered 2022-12-10 (×2): 150 mg via INTRAVENOUS

## 2022-12-10 MED ORDER — APIXABAN 5 MG PO TABS
5.0000 mg | ORAL_TABLET | Freq: Two times a day (BID) | ORAL | Status: DC
  Administered 2022-12-10: 5 mg via ORAL
  Filled 2022-12-10: qty 1

## 2022-12-10 MED ORDER — ATORVASTATIN CALCIUM 20 MG PO TABS
40.0000 mg | ORAL_TABLET | Freq: Every day | ORAL | Status: DC
  Administered 2022-12-10: 40 mg via ORAL
  Filled 2022-12-10: qty 2

## 2022-12-10 MED ORDER — ACETAMINOPHEN 500 MG PO TABS
500.0000 mg | ORAL_TABLET | Freq: Four times a day (QID) | ORAL | Status: DC | PRN
  Administered 2022-12-10: 1000 mg via ORAL
  Filled 2022-12-10: qty 2

## 2022-12-10 MED ORDER — DILTIAZEM HCL-DEXTROSE 125-5 MG/125ML-% IV SOLN (PREMIX)
INTRAVENOUS | Status: AC
  Filled 2022-12-10: qty 125

## 2022-12-10 MED ORDER — ETOMIDATE 2 MG/ML IV SOLN
8.0000 mg | Freq: Once | INTRAVENOUS | Status: AC
  Administered 2022-12-10: 8 mg via INTRAVENOUS

## 2022-12-10 MED ORDER — ETOMIDATE 2 MG/ML IV SOLN
INTRAVENOUS | Status: AC
  Filled 2022-12-10: qty 20

## 2022-12-10 MED ORDER — ONDANSETRON HCL 4 MG/2ML IJ SOLN: 4.0000 mg | Freq: Four times a day (QID) | INTRAMUSCULAR | Status: DC | PRN

## 2022-12-10 MED ORDER — DILTIAZEM HCL 25 MG/5ML IV SOLN
INTRAVENOUS | Status: AC
  Filled 2022-12-10: qty 5

## 2022-12-10 MED ORDER — NOREPINEPHRINE 4 MG/250ML-% IV SOLN
0.0000 ug/min | INTRAVENOUS | Status: DC
  Administered 2022-12-10: 5 ug/min via INTRAVENOUS

## 2022-12-10 MED ORDER — ALPRAZOLAM 0.5 MG PO TABS: 0.5000 mg | ORAL_TABLET | Freq: Four times a day (QID) | ORAL | Status: DC | PRN

## 2022-12-10 MED ORDER — AMIODARONE HCL IN DEXTROSE 360-4.14 MG/200ML-% IV SOLN: 60.0000 mg/h | INTRAVENOUS | Status: AC

## 2022-12-10 MED ORDER — PANTOPRAZOLE SODIUM 40 MG PO TBEC: 40.0000 mg | DELAYED_RELEASE_TABLET | Freq: Every day | ORAL | Status: DC

## 2022-12-10 MED ORDER — SODIUM CHLORIDE 0.9 % IV SOLN: 250.0000 mL | INTRAVENOUS | Status: DC | PRN

## 2022-12-10 MED ORDER — SODIUM CHLORIDE 0.9 % IV SOLN: INTRAVENOUS | Status: DC

## 2022-12-10 MED ORDER — AZITHROMYCIN 250 MG PO TABS: 250.0000 mg | ORAL_TABLET | Freq: Every day | ORAL | Status: DC

## 2022-12-10 MED ORDER — SODIUM CHLORIDE 0.9 % IV BOLUS
1000.0000 mL | Freq: Once | INTRAVENOUS | Status: AC
  Administered 2022-12-10: 1000 mL via INTRAVENOUS

## 2022-12-10 NOTE — Assessment & Plan Note (Signed)
Supplement potassium Check magnesium level 

## 2022-12-10 NOTE — ED Notes (Signed)
Paged Provider to clarify order: "Patient has been NSR (60-80's HR) this entire shift. Are you still wanting the Amiodarone drip?"Per Provider start Amiodarone as ordered.

## 2022-12-10 NOTE — ED Provider Notes (Signed)
[facility] Provider Note    Event Date/Time   First MD Initiated Contact with Patient 12/10/22 1558     (approximate)   History   Chest Pain   HPI  Sheila [name] is a 62 y.o. female here with chest pain, palpitations.  The patient presents from her car.  She arrives pale, diaphoretic, and was brought immediately back to her room.  She states that she has had chest pain and shortness of breath throughout the day today, and feels like she is going to pass out.  She had initial EKG concerning for SVT with a blood pressure in the 60s so she was emergently brought back to the ED.  Of note, she states that she was just here and had a heart cath for a heart attack several weeks ago.  She is on a blood thinner for A-fib.     Physical Exam   Triage Vital Signs: ED Triage Vitals  Enc Vitals Group     BP 12/10/22 1534 (!) 83/52     Pulse Rate 12/10/22 1534 (!) 226     Resp 12/10/22 1534 (!) 26     Temp 12/10/22 1534 97.6 F (36.4 C)     Temp src --      SpO2 12/10/22 1534 96 %     Weight --      Height --      Head Circumference --      Peak Flow --      Pain Score 12/10/22 1529 10     Pain Loc --      Pain Edu? --      Excl. in GC? --     Most recent vital signs: Vitals:   12/11/22 0019 12/11/22 0023  BP: (!) 165/103 (!) 165/103  Pulse: 67 67  Resp: (!) 23 (!) 23  Temp:  98.9 F (37.2 C)  SpO2: 98% 98%     General: Awake, distressed, diaphoretic. CV:  Tachycardic, irregularly irregular, pale and diaphoretic. Resp:  Slight tachypnea noted.  Lungs overall clear. Abd:  No distention.  Other:  No major edema.  No focal neurological deficits.   ED Results / Procedures / Treatments   Labs (all labs ordered are listed, but only abnormal results are displayed) Labs Reviewed  BASIC METABOLIC PANEL - Abnormal; Notable for the following components:      Result Value   Potassium 2.9 (*)    CO2 20 (*)    Glucose, Bld 146 (*)    All other  components within normal limits  CBC - Abnormal; Notable for the following components:   WBC 18.9 (*)    All other components within normal limits  HEPATIC FUNCTION PANEL - Abnormal; Notable for the following components:   Total Protein 6.4 (*)    All other components within normal limits  TROPONIN I (HIGH SENSITIVITY) - Abnormal; Notable for the following components:   Troponin I (High Sensitivity) 109 (*)    All other components within normal limits  TROPONIN I (HIGH SENSITIVITY) - Abnormal; Notable for the following components:   Troponin I (High Sensitivity) 342 (*)    All other components within normal limits  TROPONIN I (HIGH SENSITIVITY) - Abnormal; Notable for the following components:   Troponin I (High Sensitivity) 664 (*)    All other components within normal limits  MAGNESIUM  TSH  BRAIN NATRIURETIC PEPTIDE  HIV ANTIBODY (ROUTINE TESTING W REFLEX)  CBC  BASIC METABOLIC PANEL  TROPONIN  I (HIGH SENSITIVITY)     EKG EKG 1: SVT, ventricular rate 203.  QRS 94, changes concerning for SVT versus V. tach.  EKG 2: Atrial fibrillation versus junctional rhythm with ventricular rate 37.  ST depressions noted in the inferior and lateral precordial leads.  No ST elevations.  EKG 3: Normal sinus rhythm, ST depressions in the inferior and lateral precordial leads.  No ST elevations.  Ventricular rate 60.  PR 183, QRS 101, QTc 46.   RADIOLOGY Chest x-ray: Cardiomegaly with pulmonary vascular congestion   I also independently reviewed and agree with radiologist interpretations.   PROCEDURES:  Critical Care performed: Yes, see critical care procedure note(s)  .Critical Care  Performed by: Shaune Pollack, MD Authorized by: Shaune Pollack, MD   Critical care provider statement:    Critical care time (minutes):  30   Critical care time was exclusive of:  Separately billable procedures and treating other patients   Critical care was necessary to treat or prevent imminent or  life-threatening deterioration of the following conditions:  Cardiac failure, circulatory failure and respiratory failure   Critical care was time spent personally by me on the following activities:  Development of treatment plan with patient or surrogate, discussions with consultants, evaluation of patient's response to treatment, examination of patient, ordering and review of laboratory studies, ordering and review of radiographic studies, ordering and performing treatments and interventions, pulse oximetry, re-evaluation of patient's condition and review of old charts .Cardioversion  Date/Time: 12/10/2022 4:46 PM  Performed by: Shaune Pollack, MD Authorized by: Shaune Pollack, MD   Consent:    Consent obtained:  Verbal and emergent situation   Consent given by:  Patient   Risks discussed:  Cutaneous burn, induced arrhythmia, pain and death   Alternatives discussed:  Rate-control medication Pre-procedure details:    Cardioversion basis:  Emergent   Rhythm:  Ventricular tachycardia   Electrode placement:  Anterior-posterior Patient sedated: Yes. Refer to sedation procedure documentation for details of sedation.  Attempt one:    Cardioversion mode:  Synchronous   Waveform:  Biphasic   Shock (Joules):  150   Shock outcome:  Conversion to other rhythm Post-procedure details:    Patient status:  Awake   Patient tolerance of procedure:  Tolerated well, no immediate complications .Sedation  Date/Time: 12/10/2022 4:46 PM  Performed by: Shaune Pollack, MD Authorized by: Shaune Pollack, MD   Consent:    Consent obtained:  Emergent situation   Consent given by:  Patient   Risks discussed:  Allergic reaction, dysrhythmia, inadequate sedation, nausea, vomiting, respiratory compromise necessitating ventilatory assistance and intubation, prolonged sedation necessitating reversal and prolonged hypoxia resulting in organ damage   Alternatives discussed:  Analgesia without sedation Universal  protocol:    Immediately prior to procedure, a time out was called: yes   Indications:    Procedure performed:  Cardioversion   Procedure necessitating sedation performed by:  Physician performing sedation Pre-sedation assessment:    Time since last food or drink:  4   NPO status caution: unable to specify NPO status     ASA classification: class 3 - patient with severe systemic disease     Mallampati score:  III - soft palate, base of uvula visible   Pre-sedation assessments completed and reviewed: airway patency, cardiovascular function, hydration status, mental status, nausea/vomiting, pain level and respiratory function   Immediate pre-procedure details:    Reviewed: vital signs     Verified: bag valve mask available   Procedure details (see MAR  for exact dosages):    Preoxygenation:  Nonrebreather mask   Sedation:  Etomidate   Intended level of sedation: deep   Intra-procedure monitoring:  Cardiac monitor, blood pressure monitoring, frequent LOC assessments, frequent vital sign checks, continuous pulse oximetry and continuous capnometry   Intra-procedure management:  Airway repositioning   Total Provider sedation time (minutes):  10 Post-procedure details:    Attendance: Constant attendance by certified staff until patient recovered     Recovery: Patient returned to pre-procedure baseline     Post-sedation assessments completed and reviewed: airway patency, cardiovascular function, hydration status, mental status, nausea/vomiting, pain level, respiratory function and temperature     Patient is stable for discharge or admission: yes     Procedure completion:  Tolerated well, no immediate complications     MEDICATIONS ORDERED IN ED: Medications  amiodarone (NEXTERONE PREMIX) 360-4.14 MG/200ML-% (1.8 mg/mL) IV infusion (60 mg/hr Intravenous Handoff 12/11/22 0030)  sodium chloride 0.9 % bolus 1,000 mL (0 mLs Intravenous Stopped 12/10/22 1626)  etomidate (AMIDATE) injection 8 mg (8 mg  Intravenous Given 12/10/22 1541)  potassium chloride SA (KLOR-CON M) CR tablet 40 mEq (40 mEq Oral Given 12/10/22 1824)  potassium chloride 10 mEq in 100 mL IVPB (0 mEq Intravenous Stopped 12/10/22 2109)  magnesium sulfate IVPB 2 g 50 mL (0 g Intravenous Stopped 12/10/22 2109)  potassium chloride SA (KLOR-CON M) CR tablet 40 mEq (40 mEq Oral Given 12/10/22 2131)  azithromycin (ZITHROMAX) tablet 500 mg (500 mg Oral Given 12/10/22 1955)     IMPRESSION / MDM / ASSESSMENT AND PLAN / ED COURSE  I reviewed the triage vital signs and the nursing notes.                              Differential diagnosis includes, but is not limited to, VTach vs SVT vs AFib RVR, STEMI, ACS/NSTEMI, PNA, PTX  Patient's presentation is most consistent with acute presentation with potential threat to life or bodily function.  The patient is on the cardiac monitor to evaluate for evidence of arrhythmia and/or significant heart rate changes  62 yo F here with CP, SOB, diaphoresis. Pt has h/o recent NSTEMI in setting of AFib RVR s/p cath with clean coronaries. On arrival, pt in SVT vs VTach with HR 240s and BP 80s. Given etoimdate and DCCV performed at 150J with conversion to afib then return to SVT. Given amio bolus x 2 (150 mg x 2) with improvement to briefly a bradycardia/junctional rhythm, now back to sinus rhythm. CBC with moderate leukocytosis likely reactive. BMP with K 2.9, will replete. Discussed with Dr. Mariah Milling who recommends continuing amiodarone and admitting to medicine. Pt in agreement with this plan.    FINAL CLINICAL IMPRESSION(S) / ED DIAGNOSES   Final diagnoses:  SVT (supraventricular tachycardia)  Atrial fibrillation with rapid ventricular response (HCC)     Rx / DC Orders   ED Discharge Orders          Ordered    Increase activity slowly        12/10/22 2145    Diet - low sodium heart healthy        12/10/22 2145             Note:  This document was prepared using Dragon voice recognition  software and may include unintentional dictation errors.   Shaune Pollack, MD 12/11/22 [id number]

## 2022-12-10 NOTE — Progress Notes (Signed)
Plan of Care Note for accepted transfer   Patient: Sheila [name] MRN: [id number]   DOA: 12/10/2022  Facility requesting transfer: [facility]   Requesting Provider: Dr. Joylene Igo   Reason for transfer: Wide complex tachycardia   Facility course: 62 yr old female with hx of COPD, HTN, recent a fib diagnosis on Eliquis, and no significant CAD on recent LHC who p/w chest pain, nausea, and diaphoresis and found to be in wide-complex tachycardia with rate 200s and hypotension. She underwent synchronized cardioversion, was started on IV amiodarone, and has returned to SR with improved BP. Cardiology (Dr. Mayford Knife) was consulted and recommended transfer to [facility].   Plan of care: The patient is accepted for admission to Progressive unit, at [facility].   Author: Briscoe Deutscher, MD 12/10/2022  Check [email or web address] for on-call coverage.  Nursing staff, Please call [facility] Admits & Consults System-Wide number on Amion as soon as patient's arrival, so appropriate admitting provider can evaluate the pt.

## 2022-12-10 NOTE — Assessment & Plan Note (Signed)
Secondary to nicotine dependence Patient has a cough productive of yellow phlegm We will place patient on a Z-Pak

## 2022-12-10 NOTE — ED Notes (Signed)
Report called to Jamie RN at [facility] 

## 2022-12-10 NOTE — Assessment & Plan Note (Addendum)
Patient presented to the ER for evaluation of sudden onset chest pain with radiation to her left arm associated with diaphoresis, nausea and vomiting. She was hypotensive with systolic blood pressure in the 80s upon arrival to the ER and on the monitor was noted to have a wide-complex tachycardia. She received synchronized cardioversion at 150 J and converted to rapid A-fib/SVT She received amiodarone 200 mg IV push and is currently on amiodarone drip. She is currently in sinus rhythm Check and supplement electrolytes with a goal to keep potassium at 4 and magnesium greater than 2 Cardiology was consulted, Dr Armanda Magic after reviewing patient's chart she was recommended transfer to [facility] for further evaluation.  Patient has been accepted in transfer by Dr. Antionette Char

## 2022-12-10 NOTE — Assessment & Plan Note (Signed)
Most likely type II myocardial infarction due to tachycardia Patient was recently hospitalized 2 weeks prior to this admission for suspected ACS and had elevated troponin levels.  She had a left heart cath which showed normal coronaries. Continue aspirin, carvedilol and atorvastatin

## 2022-12-10 NOTE — Assessment & Plan Note (Signed)
Smoking cessation has been discussed with patient in detail She declines a nicotine transdermal patch due to rash Will place patient on nicotine gum

## 2022-12-10 NOTE — ED Triage Notes (Signed)
Patient to ED via POV for chest pain that started approx 1 hr ago with nausea/ diarrhea. Patient states she had an MI approx 2 weeks ago. Patient appears pale and diaphoretic. Centralized pain- throbbing/aching that radiates into left arm.

## 2022-12-10 NOTE — H&P (Addendum)
History and Physical    Patient: Sheila [name]:[id number] DOB: 14-Mar-1961 DOA: 12/10/2022 DOS: the patient was seen and examined on 12/10/2022 PCP: Alease Medina, MD  Patient coming from: Home  Chief Complaint:  Chief Complaint  Patient presents with   Chest Pain   HPI: Sheila [name] is a 62 y.o. female with medical history significant for nicotine dependence, COPD, hypertension, aortoiliac atherosclerosis who presents to the emergency room via private vehicle for evaluation of sudden onset chest pain. Patient states that she was driving to [employer] to go pay bills when she suddenly felt dizzy and diaphoretic.  She pulled into the parking lot and then developed central chest pain with radiation to her left arm.  She rated her pain a 6 x 10 in intensity at its worst and it was associated with nausea and several episodes of emesis and diarrhea.  She called her granddaughter who came out to the area and drove her to the emergency room for further evaluation. Upon arrival to the ER she was noted to be tachycardic with heart rate in the 230s and was hypotensive with systolic blood pressure of 83.  Her initial rhythm was a wide-complex tachycardia and because she was unstable, she was shocked at 150 J and converted to rapid atrial fibrillation/SVT.  She received amiodarone 300 mg IV push and was then started on an amiodarone drip and is currently in sinus rhythm. At the time of my evaluation her chest pain has resolved. She has a cough productive of yellow phlegm but denies having any fever or chills.  She denies having any shortness of breath, no headache, no blurred vision, no focal deficit, no abdominal pain, no urinary symptoms, no changes in her bowel habits or urinary symptoms. Abnormal labs include a white count of 18.9, troponin 109, potassium 2.9, magnesium 1.8 Chest x-ray reviewed by me shows cardiomegaly with central pulmonary vascular congestion.  She will be admitted to the hospital  for further evaluation.   Review of Systems: As mentioned in the history of present illness. All other systems reviewed and are negative. Past Medical History:  Diagnosis Date   Aorto-iliac atherosclerosis (HCC) 01/06/2016   Asthma    COPD (chronic obstructive pulmonary disease) (HCC)    Hypertension    Osteoporosis    Tobacco abuse 01/06/2016   Vitamin D deficiency 01/06/2016   Past Surgical History:  Procedure Laterality Date   ANKLE FRACTURE SURGERY Left 11/07/2009   left ankle hardware removal     left ankle surgery     LEFT HEART CATH AND CORONARY ANGIOGRAPHY N/A 11/29/2022   Procedure: LEFT HEART CATH AND CORONARY ANGIOGRAPHY;  Surgeon: Iran Ouch, MD;  Location: [facility] INVASIVE CV LAB;  Service: Cardiovascular;  Laterality: N/A;   LOWER EXTREMITY ANGIOGRAPHY Left 10/17/2017   Procedure: LOWER EXTREMITY ANGIOGRAPHY;  Surgeon: Annice Needy, MD;  Location: [facility] INVASIVE CV LAB;  Service: Cardiovascular;  Laterality: Left;   TUBAL LIGATION     Social History:  reports that she has been smoking cigarettes. She has a 30.00 pack-year smoking history. She has never used smokeless tobacco. She reports that she does not drink alcohol and does not use drugs.  Allergies  Allergen Reactions   Peach Flavor Shortness Of Breath   Vicodin [Hydrocodone-Acetaminophen] Nausea And Vomiting   Vicodin [Hydrocodone-Acetaminophen]     Abd pain, N/V   Hct [Hydrochlorothiazide] Palpitations   Tramadol Palpitations    Family History  Problem Relation Age of Onset   COPD  Mother    Heart failure Mother    COPD Father    Heart failure Father     Prior to Admission medications   Medication Sig Start Date End Date Taking? Authorizing Provider  acetaminophen (TYLENOL) 500 MG tablet Take 500-1,000 mg by mouth every 6 (six) hours as needed for mild pain or moderate pain.   Yes [provider]  ALPRAZolam Prudy Feeler) 0.5 MG tablet Take 0.5 mg by mouth 4 (four) times daily as needed for  anxiety.   Yes [provider]  apixaban (ELIQUIS) 5 MG TABS tablet Take 1 tablet (5 mg total) by mouth 2 (two) times daily. 11/30/22  Yes Enedina Finner, MD  aspirin EC 81 MG tablet Take 1 tablet (81 mg total) by mouth daily. Swallow whole. 12/01/22  Yes Enedina Finner, MD  atorvastatin (LIPITOR) 40 MG tablet Take 1 tablet (40 mg total) by mouth at bedtime. 11/30/22  Yes Enedina Finner, MD  carvedilol (COREG) 6.25 MG tablet Take 1 tablet (6.25 mg total) by mouth 2 (two) times daily with a meal. 11/30/22  Yes Enedina Finner, MD  losartan (COZAAR) 100 MG tablet Take 1 tablet (100 mg total) by mouth daily. 12/01/22  Yes Enedina Finner, MD  omeprazole (PRILOSEC) 40 MG capsule Take 40 mg by mouth daily.   Yes [provider]  oxyCODONE-acetaminophen (PERCOCET) 7.5-325 MG tablet Take 1 tablet by mouth every 8 (eight) hours as needed for severe pain.   Yes [provider]  nicotine (NICODERM CQ - DOSED IN MG/24 HOURS) 21 mg/24hr patch Place 1 patch (21 mg total) onto the skin daily as needed (nicotine craving). Patient not taking: Reported on 12/10/2022 11/30/22   Enedina Finner, MD    Physical Exam: Vitals:   12/10/22 1830 12/10/22 1900 12/10/22 1915 12/10/22 1920  BP: (!) 142/107 (!) 163/93 (!) 145/88   Pulse: 67 65 66   Resp: (!) 24 16 (!) 25   Temp:    99.1 F (37.3 C)  TempSrc:    Oral  SpO2: (!) 89% 93% 90%    Physical Exam Vitals and nursing note reviewed.  Constitutional:      Appearance: She is obese.  HENT:     Head: Normocephalic.  Eyes:     Pupils: Pupils are equal, round, and reactive to light.  Cardiovascular:     Rate and Rhythm: Normal rate and regular rhythm.  Pulmonary:     Effort: Pulmonary effort is normal.     Breath sounds: Examination of the right-lower field reveals rales. Examination of the left-lower field reveals rales. Rales present.  Abdominal:     General: Bowel sounds are normal.     Palpations: Abdomen is soft.  Musculoskeletal:        General:  Normal range of motion.     Cervical back: Normal range of motion and neck supple.  Skin:    General: Skin is warm and dry.  Neurological:     General: No focal deficit present.     Mental Status: She is alert.  Psychiatric:        Mood and Affect: Mood normal.        Behavior: Behavior normal.     Data Reviewed: Relevant notes from primary care and specialist visits, past discharge summaries as available in EHR, including Care Everywhere. Prior diagnostic testing as pertinent to current admission diagnoses Updated medications and problem lists for reconciliation ED course, including vitals, labs, imaging, treatment and response to treatment Triage notes, nursing and  pharmacy notes and ED provider's notes Notable results as noted in HPI Labs reviewed.  Troponin 109 >> 342, magnesium 1.8, TSH 2.39, total protein 6.4, albumin 3.6, AST 21, ALT 16, alkaline phosphatase 64, total bilirubin 0.8, sodium 138, potassium 2.9, chloride 107, bicarb 20, glucose 146, BUN 12, creatinine 0.79, calcium 9.5, white count 18.9, hemoglobin 13.9, hematocrit 43.6, platelet count 324 There are no new results to review at this time.  Assessment and Plan: * Wide-complex tachycardia Patient presented to the ER for evaluation of sudden onset chest pain with radiation to her left arm associated with diaphoresis, nausea and vomiting. She was hypotensive with systolic blood pressure in the 80s upon arrival to the ER and on the monitor was noted to have a wide-complex tachycardia. She received synchronized cardioversion at 150 J and converted to rapid A-fib/SVT She received amiodarone 200 mg IV push and is currently on amiodarone drip. She is currently in sinus rhythm Check and supplement electrolytes with a goal to keep potassium at 4 and magnesium greater than 2 Cardiology was consulted, Dr Armanda Magic after reviewing patient's chart she was recommended transfer to [facility] for further evaluation.  Patient has  been accepted in transfer by Dr. Antionette Char   NSTEMI (non-ST elevated myocardial infarction) [facility]) Most likely type II myocardial infarction due to tachycardia Patient was recently hospitalized 2 weeks prior to this admission for suspected ACS and had elevated troponin levels.  She had a left heart cath which showed normal coronaries. Continue aspirin, carvedilol and atorvastatin  Essential hypertension Continue carvedilol and losartan  Bronchitis, chronic, mucopurulent (HCC) Secondary to nicotine dependence Patient has a cough productive of yellow phlegm We will place patient on a Z-Pak  Paroxysmal atrial fibrillation (HCC) Continue Eliquis as primary prophylaxis for an acute stroke Continue carvedilol and amiodarone for rate control  Hypokalemia Supplement potassium  Check magnesium level  Aorto-iliac atherosclerosis (HCC) Continue aspirin, carvedilol and atorvastatin  Tobacco abuse Smoking cessation has been discussed with patient in detail She declines a nicotine transdermal patch due to rash Will place patient on nicotine gum      Advance Care Planning:   Code Status: Full Code   Consults: Cardiology  Family Communication: Greater than 50% of time was spent discussing patient's condition and plan of care with her and her daughter at the bedside.  All questions and concerns have been addressed.  She verbalizes understanding and agrees with the plan.  Severity of Illness: The appropriate patient status for this patient is INPATIENT. Inpatient status is judged to be reasonable and necessary in order to provide the required intensity of service to ensure the patient's safety. The patient's presenting symptoms, physical exam findings, and initial radiographic and laboratory data in the context of their chronic comorbidities is felt to place them at high risk for further clinical deterioration. Furthermore, it is not anticipated that the patient will be medically stable for  discharge from the hospital within 2 midnights of admission.   * I certify that at the point of admission it is my clinical judgment that the patient will require inpatient hospital care spanning beyond 2 midnights from the point of admission due to high intensity of service, high risk for further deterioration and high frequency of surveillance required.*  Author: Lucile Shutters, MD 12/10/2022 9:50 PM  For on call review [email or web address].

## 2022-12-10 NOTE — ED Notes (Signed)
Patient shocked at 150 j by Penne Lash, MD

## 2022-12-10 NOTE — Assessment & Plan Note (Signed)
Continue carvedilol and losartan 

## 2022-12-10 NOTE — Assessment & Plan Note (Signed)
Continue aspirin, carvedilol and atorvastatin

## 2022-12-10 NOTE — Assessment & Plan Note (Signed)
Continue Eliquis as primary prophylaxis for an acute stroke Continue carvedilol and amiodarone for rate control

## 2022-12-10 NOTE — Discharge Summary (Signed)
Physician Discharge Summary   Patient: Sheila [name] MRN: [id number] DOB: November 05, 1960  Admit date:     12/10/2022  Discharge date: 12/10/22  Discharge Physician: Jeylin Woodmansee   PCP: Alease Medina, MD   Recommendations at discharge:   Transfer to [facility]  Discharge Diagnoses: Principal Problem:   Wide-complex tachycardia Active Problems:   NSTEMI (non-ST elevated myocardial infarction) [facility])   Essential hypertension   Bronchitis, chronic, mucopurulent (HCC)   Tobacco abuse   Aorto-iliac atherosclerosis (HCC)   Hypokalemia   Paroxysmal atrial fibrillation (HCC)  Resolved Problems:   * No resolved hospital problems. *  Hospital Course:  Sheila [name] is a 62 y.o. female with medical history significant for nicotine dependence, COPD, hypertension, aorto iliac atherosclerosis who presents to the emergency room via private vehicle for evaluation of sudden onset chest pain. Patient states that she was driving to [employer] to go pay bills when she suddenly felt dizzy and diaphoretic.  She pulled into the parking lot and then developed central chest pain with radiation to her left arm.  She rated her pain a 6 x 10 in intensity at its worst and it was associated with nausea and several episodes of emesis and diarrhea.  She called her granddaughter who came out to the area and drove her to the emergency room for further evaluation. Upon arrival to the ER she was noted to be tachycardic with heart rate in the 230s and was hypotensive with systolic blood pressure of 83.  Her initial rhythm was a wide-complex tachycardia and because she was unstable, she was shocked at 150 J and converted to rapid atrial fibrillation/SVT.  She received amiodarone 300 mg IV push and was then started on an amiodarone drip and is currently in sinus rhythm. At the time of my evaluation her chest pain had resolved. She has a cough productive of yellow phlegm but denies having any fever or chills.  She denies  having any shortness of breath, no headache, no blurred vision, no focal deficit, no abdominal pain, no urinary symptoms, no changes in her bowel habits or urinary symptoms. Abnormal labs include a white count of 18.9, troponin 109, potassium 2.9, magnesium 1.8 Chest x-ray reviewed by me shows cardiomegaly with central pulmonary vascular congestion.  She will be admitted to the hospital for further evaluation.  Assessment and Plan: * Wide-complex tachycardia Patient presented to the ER for evaluation of sudden onset chest pain with radiation to her left arm associated with diaphoresis, nausea and vomiting. She was hypotensive with systolic blood pressure in the 80s upon arrival to the ER and on the monitor was noted to have a wide-complex tachycardia. She received synchronized cardioversion at 150 J and converted to rapid A-fib/SVT She received amiodarone 200 mg IV push and is currently on amiodarone drip. She is currently in sinus rhythm Check and supplement electrolytes with a goal to keep potassium at 4 and magnesium greater than 2 Cardiology was consulted, Dr Armanda Magic after reviewing patient's chart she was recommended transfer to [facility] for further evaluation.  Patient has been accepted in transfer by Dr. Antionette Char   NSTEMI (non-ST elevated myocardial infarction) [facility]) Most likely type II myocardial infarction due to tachycardia Patient was recently hospitalized 2 weeks prior to this admission for suspected ACS and had elevated troponin levels.  She had a left heart cath which showed normal coronaries. Continue aspirin, carvedilol and atorvastatin  Essential hypertension Continue carvedilol and losartan  Bronchitis, chronic, mucopurulent (HCC) Secondary to  nicotine dependence Patient has a cough productive of yellow phlegm We will place patient on a Z-Pak  Paroxysmal atrial fibrillation (HCC) Continue Eliquis as primary prophylaxis for an acute stroke Continue carvedilol and  amiodarone for rate control  Hypokalemia Supplement potassium  Check magnesium level  Aorto-iliac atherosclerosis (HCC) Continue aspirin, carvedilol and atorvastatin  Tobacco abuse Smoking cessation has been discussed with patient in detail She declines a nicotine transdermal patch due to rash Will place patient on nicotine gum         Consultants: Cardiology Procedures performed: Synchronized cardioversion at 150 J Disposition:  Transfer to acute care hospital Diet recommendation:  Cardiac diet DISCHARGE MEDICATION: Allergies as of 12/10/2022       Reactions   Peach Flavor Shortness Of Breath   Vicodin [hydrocodone-acetaminophen] Nausea And Vomiting   Vicodin [hydrocodone-acetaminophen]    Abd pain, N/V   Hct [hydrochlorothiazide] Palpitations   Tramadol Palpitations        Medication List     STOP taking these medications    nicotine 21 mg/24hr patch Commonly known as: NICODERM CQ - dosed in mg/24 hours       TAKE these medications    acetaminophen 500 MG tablet Commonly known as: TYLENOL Take 500-1,000 mg by mouth every 6 (six) hours as needed for mild pain or moderate pain.   ALPRAZolam 0.5 MG tablet Commonly known as: XANAX Take 0.5 mg by mouth 4 (four) times daily as needed for anxiety.   apixaban 5 MG Tabs tablet Commonly known as: ELIQUIS Take 1 tablet (5 mg total) by mouth 2 (two) times daily.   aspirin EC 81 MG tablet Take 1 tablet (81 mg total) by mouth daily. Swallow whole.   atorvastatin 40 MG tablet Commonly known as: LIPITOR Take 1 tablet (40 mg total) by mouth at bedtime.   carvedilol 6.25 MG tablet Commonly known as: COREG Take 1 tablet (6.25 mg total) by mouth 2 (two) times daily with a meal.   losartan 100 MG tablet Commonly known as: COZAAR Take 1 tablet (100 mg total) by mouth daily.   omeprazole 40 MG capsule Commonly known as: PRILOSEC Take 40 mg by mouth daily.   oxyCODONE-acetaminophen 7.5-325 MG  tablet Commonly known as: PERCOCET Take 1 tablet by mouth every 8 (eight) hours as needed for severe pain.        Discharge Exam: There were no vitals filed for this visit.  Vitals and nursing note reviewed.  Constitutional:      Appearance: She is obese.  HENT:     Head: Normocephalic.  Eyes:     Pupils: Pupils are equal, round, and reactive to light.  Cardiovascular:     Rate and Rhythm: Normal rate and regular rhythm.  Pulmonary:     Effort: Pulmonary effort is normal.     Breath sounds: Examination of the right-lower field reveals rales. Examination of the left-lower field reveals rales. Rales present.  Abdominal:     General: Bowel sounds are normal.     Palpations: Abdomen is soft.  Musculoskeletal:        General: Normal range of motion.     Cervical back: Normal range of motion and neck supple.  Skin:    General: Skin is warm and dry.  Neurological:     General: No focal deficit present.     Mental Status: She is alert.  Psychiatric:        Mood and Affect: Mood normal.  Behavior: Behavior normal.     Condition at discharge: stable  The results of significant diagnostics from this hospitalization (including imaging, microbiology, ancillary and laboratory) are listed below for reference.   Imaging Studies: DG Chest Port 1 View  Result Date: 12/10/2022 CLINICAL DATA:  Shortness of breath EXAM: PORTABLE CHEST 1 VIEW COMPARISON:  Chest x-ray 11/26/2022 FINDINGS: The heart is enlarged. There central pulmonary vascular congestion. There is no focal lung infiltrate, pleural effusion or pneumothorax. No acute fractures are seen. IMPRESSION: Cardiomegaly with central pulmonary vascular congestion. Electronically Signed   By: Darliss Cheney M.D.   On: 12/10/2022 16:27   CARDIAC CATHETERIZATION  Result Date: 11/29/2022 1.  Normal coronary arteries. 2.  Left ventricular angiography was not performed.  EF was normal by echo. 3.  Severely elevated systolic blood  pressure with mildly elevated left ventricular end-diastolic pressure. Recommendations: Suspect that elevated troponin is due to type II myocardial infarction due to A-fib with RVR and severely elevated blood pressure. Heparin drip to be resumed at 6 PM.  This can be transition to Eliquis tomorrow. Continue blood pressure control.  I increased both carvedilol and losartan.   ECHOCARDIOGRAM COMPLETE  Result Date: 11/28/2022    ECHOCARDIOGRAM REPORT   Patient Name:   YULY ROHLFING Date of Exam: 11/28/2022 Medical Rec #:  [id number]       Height:       62.0 in Accession #:    [id number]      Weight:       169.0 lb Date of Birth:  1960/12/14        BSA:          1.780 m Patient Age:    61 years        BP:           172/80 mmHg Patient Gender: F               HR:           56 bpm. Exam Location:  [facility] Procedure: 2D Echo Indications:     NSTEMI I21.4  History:         Patient has prior history of Echocardiogram examinations, most                  recent 02/28/2017.  Sonographer:     Overton Mam RDCS, FASE Referring Phys:  [id number] AMY N COX Diagnosing Phys: Debbe Odea MD  Sonographer Comments: Technically difficult study due to poor echo windows. Image acquisition challenging due to respiratory motion. IMPRESSIONS  1. Left ventricular ejection fraction, by estimation, is 55 to 60%. The left ventricle has normal function. The left ventricle has no regional wall motion abnormalities. There is mild left ventricular hypertrophy. Left ventricular diastolic parameters are consistent with Grade II diastolic dysfunction (pseudonormalization).  2. Right ventricular systolic function is normal. The right ventricular size is normal.  3. The mitral valve is normal in structure. No evidence of mitral valve regurgitation.  4. The aortic valve was not well visualized. Aortic valve regurgitation is not visualized. Aortic valve sclerosis is present, with no evidence of aortic valve stenosis.  5. The inferior vena cava is  normal in size with greater than 50% respiratory variability, suggesting right atrial pressure of 3 mmHg. FINDINGS  Left Ventricle: Left ventricular ejection fraction, by estimation, is 55 to 60%. The left ventricle has normal function. The left ventricle has no regional wall motion abnormalities. The left ventricular internal cavity size was normal in size.  There is  mild left ventricular hypertrophy. Left ventricular diastolic parameters are consistent with Grade II diastolic dysfunction (pseudonormalization). Right Ventricle: The right ventricular size is normal. No increase in right ventricular wall thickness. Right ventricular systolic function is normal. Left Atrium: Left atrial size was normal in size. Right Atrium: Right atrial size was normal in size. Pericardium: There is no evidence of pericardial effusion. Mitral Valve: The mitral valve is normal in structure. No evidence of mitral valve regurgitation. Tricuspid Valve: The tricuspid valve is not well visualized. Tricuspid valve regurgitation is not demonstrated. Aortic Valve: The aortic valve was not well visualized. Aortic valve regurgitation is not visualized. Aortic valve sclerosis is present, with no evidence of aortic valve stenosis. Aortic valve peak gradient measures 7.1 mmHg. Pulmonic Valve: The pulmonic valve was not well visualized. Pulmonic valve regurgitation is not visualized. Aorta: The aortic root and ascending aorta are structurally normal, with no evidence of dilitation. Venous: The inferior vena cava is normal in size with greater than 50% respiratory variability, suggesting right atrial pressure of 3 mmHg. IAS/Shunts: No atrial level shunt detected by color flow Doppler.  LEFT VENTRICLE PLAX 2D LVIDd:         4.50 cm   Diastology LVIDs:         3.20 cm   LV e' medial:    4.79 cm/s LV PW:         1.50 cm   LV E/e' medial:  18.3 LV IVS:        1.60 cm   LV e' lateral:   4.46 cm/s LVOT diam:     2.00 cm   LV E/e' lateral: 19.6 LV SV:          55 LV SV Index:   31 LVOT Area:     3.14 cm  RIGHT VENTRICLE RV Basal diam:  2.90 cm RV S prime:     12.40 cm/s TAPSE (M-mode): 1.6 cm LEFT ATRIUM             Index        RIGHT ATRIUM           Index LA diam:        4.60 cm 2.58 cm/m   RA Area:     18.40 cm LA Vol (A2C):   41.5 ml 23.32 ml/m  RA Volume:   47.30 ml  26.58 ml/m LA Vol (A4C):   20.8 ml 11.69 ml/m LA Biplane Vol: 30.5 ml 17.14 ml/m  AORTIC VALVE                 PULMONIC VALVE AV Area (Vmax): 1.98 cm     PV Vmax:        0.83 m/s AV Vmax:        133.00 cm/s  PV Peak grad:   2.8 mmHg AV Peak Grad:   7.1 mmHg     RVOT Peak grad: 3 mmHg LVOT Vmax:      83.80 cm/s LVOT Vmean:     57.200 cm/s LVOT VTI:       0.174 m  AORTA Ao Root diam: 2.80 cm Ao Asc diam:  2.80 cm MITRAL VALVE MV Area (PHT): 2.21 cm    SHUNTS MV Decel Time: 344 msec    Systemic VTI:  0.17 m MV E velocity: 87.50 cm/s  Systemic Diam: 2.00 cm MV A velocity: 97.50 cm/s MV E/A ratio:  0.90 Debbe Odea MD Electronically signed by Debbe Odea MD Signature Date/Time: 11/28/2022/2:08:58 PM  Final    DG Chest Port 1 View  Result Date: 11/26/2022 CLINICAL DATA:  Chest pain EXAM: PORTABLE CHEST 1 VIEW COMPARISON:  X-ray 02/26/2017.  CT 01/30/2018 FINDINGS: Overlapping cardiac leads and defibrillator pads. Enlarged cardiopericardial silhouette with some vascular congestion. No consolidation, pneumothorax or effusion. IMPRESSION: Enlarged cardiopericardial silhouette with some vascular congestion. Electronically Signed   By: Karen Kays M.D.   On: 11/26/2022 15:57    Microbiology: Results for orders placed or performed in visit on 10/05/18  Urine Culture     Status: Abnormal   Collection Time: 10/05/18 12:29 PM   BLD  Result Value Ref Range Status   Urine Culture, Routine Final report (A)  Final   Organism ID, Bacteria Klebsiella pneumoniae (A)  Final    Comment: Greater than 100,000 colony forming units per mL Cefazolin <=4 ug/mL Cefazolin with an MIC <=16  predicts susceptibility to the oral agents cefaclor, cefdinir, cefpodoxime, cefprozil, cefuroxime, cephalexin, and loracarbef when used for therapy of uncomplicated urinary tract infections due to E. coli, Klebsiella pneumoniae, and Proteus mirabilis.    ORGANISM ID, BACTERIA Proteus mirabilis (A)  Final    Comment: Greater than 100,000 colony forming units per mL Cefazolin <=4 ug/mL Cefazolin with an MIC <=16 predicts susceptibility to the oral agents cefaclor, cefdinir, cefpodoxime, cefprozil, cefuroxime, cephalexin, and loracarbef when used for therapy of uncomplicated urinary tract infections due to E. coli, Klebsiella pneumoniae, and Proteus mirabilis.    Antimicrobial Susceptibility Comment  Final    Comment:       ** S = Susceptible; I = Intermediate; R = Resistant **                    P = Positive; N = Negative             MICS are expressed in micrograms per mL    Antibiotic                 RSLT#1    RSLT#2    RSLT#3    RSLT#4 Amoxicillin/Clavulanic Acid    S         S Ampicillin                     R         S Cefepime                       S         S Ceftriaxone                    S         S Cefuroxime                     S         S Ciprofloxacin                  S         S Ertapenem                      S         S Gentamicin                     S         R Imipenem  S Levofloxacin                   S         S Meropenem                      S         S Nitrofurantoin                 S         R Piperacillin/Tazobactam        S         S Tetracycline                   S         R Tobramycin                     S         R Trimethoprim/Sulfa             S         S     Labs: CBC: Recent Labs  Lab 12/10/22 1537  WBC 18.9*  HGB 13.9  HCT 43.6  MCV 97.8  PLT 324   Basic Metabolic Panel: Recent Labs  Lab 12/10/22 1537 12/10/22 1602  NA 138  --   K 2.9*  --   CL 107  --   CO2 20*  --   GLUCOSE 146*  --   BUN 12  --    CREATININE 0.79  --   CALCIUM 9.5  --   MG  --  1.8   Liver Function Tests: Recent Labs  Lab 12/10/22 1602  AST 21  ALT 16  ALKPHOS 64  BILITOT 0.8  PROT 6.4*  ALBUMIN 3.6   CBG: No results for input(s): "GLUCAP" in the last 168 hours.  Discharge time spent: greater than 30 minutes.  Signed: Lucile Shutters, MD [facility] 12/10/2022

## 2022-12-10 NOTE — ED Notes (Addendum)
Patient now in sinus rhythm at 61. New EKG obtained at this time. Good pulses.

## 2022-12-10 NOTE — ED Notes (Signed)
Pt accepted to [facility] bed 12C per Gabriel Earing, Nurse, adult. Call report to [phone]. [facility] will transport

## 2022-12-11 ENCOUNTER — Inpatient Hospital Stay (HOSPITAL_COMMUNITY)
Admission: AD | Admit: 2022-12-11 | Discharge: 2022-12-12 | DRG: 309 | Disposition: A | Discharge status: Home / Self Care | Payer: Medicaid Other | Source: Other Acute Inpatient Hospital | Attending: Family Medicine | Admitting: Family Medicine

## 2022-12-11 ENCOUNTER — Encounter (HOSPITAL_COMMUNITY): Payer: Self-pay | Admitting: Family Medicine

## 2022-12-11 DIAGNOSIS — I959 Hypotension, unspecified: Secondary | ICD-10-CM | POA: Diagnosis present

## 2022-12-11 DIAGNOSIS — E785 Hyperlipidemia, unspecified: Secondary | ICD-10-CM | POA: Diagnosis present

## 2022-12-11 DIAGNOSIS — I480 Paroxysmal atrial fibrillation: Secondary | ICD-10-CM | POA: Diagnosis present

## 2022-12-11 DIAGNOSIS — J441 Chronic obstructive pulmonary disease with (acute) exacerbation: Secondary | ICD-10-CM | POA: Diagnosis present

## 2022-12-11 DIAGNOSIS — R6510 Systemic inflammatory response syndrome (SIRS) of non-infectious origin without acute organ dysfunction: Secondary | ICD-10-CM | POA: Diagnosis present

## 2022-12-11 DIAGNOSIS — Z5986 Financial insecurity: Secondary | ICD-10-CM

## 2022-12-11 DIAGNOSIS — I10 Essential (primary) hypertension: Secondary | ICD-10-CM | POA: Diagnosis present

## 2022-12-11 DIAGNOSIS — R7303 Prediabetes: Secondary | ICD-10-CM | POA: Diagnosis present

## 2022-12-11 DIAGNOSIS — R079 Chest pain, unspecified: Secondary | ICD-10-CM | POA: Diagnosis present

## 2022-12-11 DIAGNOSIS — Z888 Allergy status to other drugs, medicaments and biological substances status: Secondary | ICD-10-CM

## 2022-12-11 DIAGNOSIS — D72829 Elevated white blood cell count, unspecified: Secondary | ICD-10-CM | POA: Diagnosis present

## 2022-12-11 DIAGNOSIS — E669 Obesity, unspecified: Secondary | ICD-10-CM | POA: Diagnosis present

## 2022-12-11 DIAGNOSIS — I110 Hypertensive heart disease with heart failure: Secondary | ICD-10-CM | POA: Diagnosis present

## 2022-12-11 DIAGNOSIS — R7989 Other specified abnormal findings of blood chemistry: Secondary | ICD-10-CM | POA: Insufficient documentation

## 2022-12-11 DIAGNOSIS — F17210 Nicotine dependence, cigarettes, uncomplicated: Secondary | ICD-10-CM | POA: Diagnosis present

## 2022-12-11 DIAGNOSIS — Z560 Unemployment, unspecified: Secondary | ICD-10-CM

## 2022-12-11 DIAGNOSIS — Z79899 Other long term (current) drug therapy: Secondary | ICD-10-CM

## 2022-12-11 DIAGNOSIS — R001 Bradycardia, unspecified: Secondary | ICD-10-CM | POA: Diagnosis not present

## 2022-12-11 DIAGNOSIS — Z885 Allergy status to narcotic agent status: Secondary | ICD-10-CM

## 2022-12-11 DIAGNOSIS — I471 Supraventricular tachycardia, unspecified: Principal | ICD-10-CM | POA: Diagnosis present

## 2022-12-11 DIAGNOSIS — Z91018 Allergy to other foods: Secondary | ICD-10-CM

## 2022-12-11 DIAGNOSIS — I739 Peripheral vascular disease, unspecified: Secondary | ICD-10-CM | POA: Diagnosis present

## 2022-12-11 DIAGNOSIS — Z8249 Family history of ischemic heart disease and other diseases of the circulatory system: Secondary | ICD-10-CM | POA: Diagnosis not present

## 2022-12-11 DIAGNOSIS — R000 Tachycardia, unspecified: Secondary | ICD-10-CM | POA: Diagnosis not present

## 2022-12-11 DIAGNOSIS — I5032 Chronic diastolic (congestive) heart failure: Secondary | ICD-10-CM | POA: Diagnosis present

## 2022-12-11 DIAGNOSIS — J4489 Other specified chronic obstructive pulmonary disease: Secondary | ICD-10-CM | POA: Diagnosis present

## 2022-12-11 DIAGNOSIS — Z7901 Long term (current) use of anticoagulants: Secondary | ICD-10-CM

## 2022-12-11 DIAGNOSIS — Z6830 Body mass index (BMI) 30.0-30.9, adult: Secondary | ICD-10-CM | POA: Diagnosis not present

## 2022-12-11 DIAGNOSIS — Z720 Tobacco use: Secondary | ICD-10-CM | POA: Diagnosis present

## 2022-12-11 DIAGNOSIS — E876 Hypokalemia: Secondary | ICD-10-CM | POA: Diagnosis present

## 2022-12-11 DIAGNOSIS — Z7982 Long term (current) use of aspirin: Secondary | ICD-10-CM

## 2022-12-11 DIAGNOSIS — Z825 Family history of asthma and other chronic lower respiratory diseases: Secondary | ICD-10-CM | POA: Diagnosis not present

## 2022-12-11 DIAGNOSIS — M810 Age-related osteoporosis without current pathological fracture: Secondary | ICD-10-CM | POA: Diagnosis present

## 2022-12-11 DIAGNOSIS — I2489 Other forms of acute ischemic heart disease: Secondary | ICD-10-CM | POA: Diagnosis present

## 2022-12-11 DIAGNOSIS — R739 Hyperglycemia, unspecified: Secondary | ICD-10-CM | POA: Diagnosis present

## 2022-12-11 DIAGNOSIS — J449 Chronic obstructive pulmonary disease, unspecified: Secondary | ICD-10-CM | POA: Diagnosis present

## 2022-12-11 LAB — CBC
HCT: 44 % (ref 36.0–46.0)
Hemoglobin: 14.1 g/dL (ref 12.0–15.0)
MCH: 31.6 pg (ref 26.0–34.0)
MCHC: 32 g/dL (ref 30.0–36.0)
MCV: 98.7 fL (ref 80.0–100.0)
Platelets: 295 10*3/uL (ref 150–400)
RBC: 4.46 MIL/uL (ref 3.87–5.11)
RDW: 13.7 % (ref 11.5–15.5)
WBC: 15 10*3/uL — ABNORMAL HIGH (ref 4.0–10.5)
nRBC: 0 % (ref 0.0–0.2)

## 2022-12-11 LAB — BRAIN NATRIURETIC PEPTIDE: B Natriuretic Peptide: 75 pg/mL (ref 0.0–100.0)

## 2022-12-11 LAB — PHOSPHORUS: Phosphorus: 2.8 mg/dL (ref 2.5–4.6)

## 2022-12-11 LAB — COMPREHENSIVE METABOLIC PANEL
ALT: 18 U/L (ref 0–44)
AST: 22 U/L (ref 15–41)
Albumin: 3.6 g/dL (ref 3.5–5.0)
Alkaline Phosphatase: 68 U/L (ref 38–126)
Anion gap: 6 (ref 5–15)
BUN: 9 mg/dL (ref 8–23)
CO2: 19 mmol/L — ABNORMAL LOW (ref 22–32)
Calcium: 9.9 mg/dL (ref 8.9–10.3)
Chloride: 113 mmol/L — ABNORMAL HIGH (ref 98–111)
Creatinine, Ser: 0.72 mg/dL (ref 0.44–1.00)
GFR, Estimated: >60 mL/min (ref >60)
Glucose, Bld: 112 mg/dL — ABNORMAL HIGH (ref 70–99)
Potassium: 4.7 mmol/L (ref 3.5–5.1)
Sodium: 138 mmol/L (ref 135–145)
Total Bilirubin: 0.3 mg/dL (ref 0.3–1.2)
Total Protein: 6.3 g/dL — ABNORMAL LOW (ref 6.5–8.1)

## 2022-12-11 LAB — MAGNESIUM: Magnesium: 2.4 mg/dL (ref 1.7–2.4)

## 2022-12-11 LAB — HIV ANTIBODY (ROUTINE TESTING W REFLEX): HIV Screen 4th Generation wRfx: NONREACTIVE

## 2022-12-11 LAB — TROPONIN I (HIGH SENSITIVITY): Troponin I (High Sensitivity): 664 ng/L (ref <18)

## 2022-12-11 LAB — MRSA NEXT GEN BY PCR, NASAL: MRSA by PCR Next Gen: NOT DETECTED

## 2022-12-11 MED ORDER — CARVEDILOL 3.125 MG PO TABS
3.1250 mg | ORAL_TABLET | Freq: Two times a day (BID) | ORAL | Status: DC
  Administered 2022-12-11 – 2022-12-12 (×3): 3.125 mg via ORAL
  Filled 2022-12-11 (×3): qty 1

## 2022-12-11 MED ORDER — ATORVASTATIN CALCIUM 40 MG PO TABS
40.0000 mg | ORAL_TABLET | Freq: Every day | ORAL | Status: DC
  Administered 2022-12-11: 40 mg via ORAL
  Filled 2022-12-11: qty 1

## 2022-12-11 MED ORDER — AMIODARONE HCL IN DEXTROSE 360-4.14 MG/200ML-% IV SOLN: 60.0000 mg/h | INTRAVENOUS | Status: DC

## 2022-12-11 MED ORDER — IPRATROPIUM-ALBUTEROL 0.5-2.5 (3) MG/3ML IN SOLN: 3.0000 mL | RESPIRATORY_TRACT | Status: DC | PRN

## 2022-12-11 MED ORDER — MELATONIN 5 MG PO TABS: 5.0000 mg | ORAL_TABLET | Freq: Every evening | ORAL | Status: DC | PRN

## 2022-12-11 MED ORDER — APIXABAN 5 MG PO TABS
5.0000 mg | ORAL_TABLET | Freq: Two times a day (BID) | ORAL | Status: DC
  Administered 2022-12-11 – 2022-12-12 (×4): 5 mg via ORAL
  Filled 2022-12-11 (×5): qty 1

## 2022-12-11 MED ORDER — AMIODARONE HCL IN DEXTROSE 360-4.14 MG/200ML-% IV SOLN
30.0000 mg/h | INTRAVENOUS | Status: DC
  Administered 2022-12-11 (×2): 30 mg/h via INTRAVENOUS
  Filled 2022-12-11: qty 200

## 2022-12-11 MED ORDER — AMIODARONE HCL 200 MG PO TABS: 400.0000 mg | ORAL_TABLET | Freq: Every day | ORAL | Status: DC

## 2022-12-11 MED ORDER — PROCHLORPERAZINE EDISYLATE 10 MG/2ML IJ SOLN: 5.0000 mg | Freq: Four times a day (QID) | INTRAMUSCULAR | Status: DC | PRN

## 2022-12-11 MED ORDER — ACETAMINOPHEN 325 MG PO TABS: 650.0000 mg | ORAL_TABLET | Freq: Four times a day (QID) | ORAL | Status: DC | PRN

## 2022-12-11 MED ORDER — ASPIRIN 81 MG PO TBEC
81.0000 mg | DELAYED_RELEASE_TABLET | Freq: Every day | ORAL | Status: DC
  Administered 2022-12-11 – 2022-12-12 (×2): 81 mg via ORAL
  Filled 2022-12-11 (×2): qty 1

## 2022-12-11 MED ORDER — AMIODARONE HCL 200 MG PO TABS
400.0000 mg | ORAL_TABLET | Freq: Two times a day (BID) | ORAL | Status: DC
  Administered 2022-12-11 – 2022-12-12 (×3): 400 mg via ORAL
  Filled 2022-12-11 (×3): qty 2

## 2022-12-11 MED ORDER — AMIODARONE HCL 200 MG PO TABS: 200.0000 mg | ORAL_TABLET | Freq: Every day | ORAL | Status: DC

## 2022-12-11 MED ORDER — POTASSIUM CHLORIDE CRYS ER 20 MEQ PO TBCR
40.0000 meq | EXTENDED_RELEASE_TABLET | Freq: Once | ORAL | Status: AC
  Administered 2022-12-11: 40 meq via ORAL
  Filled 2022-12-11: qty 2

## 2022-12-11 NOTE — Hospital Course (Signed)
Mrs. Orloff is a 62 y.o. F with HTN, COPD, dCHF, and smoking who presented with chest pain radiating to her left arm.   5/3: Admitted to [facility], found to have unstable wide complex tachycardia, emergently cardioverted --> developed rapit Afib and started on amiodarone gtt 5/4: Transferred to [facility] for EP evaluation

## 2022-12-11 NOTE — H&P (Addendum)
History and Physical  Sheila [name]:[id number] DOB: 08/16/1960 DOA: 12/11/2022  Referring physician: Dr. Mal Amabile, Hospitalist service PCP: Girtha Rm Eli Phillips, MD  Outpatient Specialists: Cardiology. Patient coming from: Home through [facility] transfer.  Chief Complaint: Chest pain  HPI: Sheila [name] is a 62 y.o. female with medical history significant for tobacco use disorder, COPD, obesity, hypertension, aortoiliac atherosclerosis, paroxysmal A-fib on Eliquis, who initially presented to [facility] ED via private vehicle, with complaints of sudden onset chest pain.  The patient was driving to [place] to go pay her bills when she suddenly felt dizzy and diaphoretic.  She pulled into a parking lot and developed moderate centrally located chest pain radiating to her left arm.  Associated with nausea vomiting and diarrhea.  She called her daughter who came to pick her up and drove her to the ED for further evaluation.  Upon arrival to the ER she was noted to be severely tachycardic with heart rate in the 230s and hypotensive with SBP's in the 80s.  She was noted to be in wide-complex tachycardia.  Due to hemodynamic instability she received synchronized cardioversion at 150 J then converted to rapid atrial fibrillation/SVT.  The patient received a loading dose of amiodarone and was started on amiodarone drip.  Chest x-ray was notable for cardiomegaly and central pulmonary vascular congestion.  Lab studies were notable for leukocytosis 18.9, high-sensitivity troponin 109 which up trended to 664, hypokalemia 2.9, magnesium 1.8.  EDP discussed the case with cardiology who recommended to continue amiodarone drip and admit to the hospitalist service.  The patient was admitted by Dr. Joylene Igo, [facility], and transferred to [facility] progressive care unit as inpatient status.  At the time of this visit, the patient is on amiodarone drip and denies having any anginal symptoms, palpitations, or shortness of  breath while at rest.  She is on 2 L nasal cannula.  Not on oxygen supplementation at baseline.   ED Course: Tmax 98.9.  BP 147/99, pulse 63, respiratory 20, O2 saturation 94% on 2 L.  Lab studies notable for serum potassium 2.9, serum bicarb 20, serum glucose 146.  High-sensitivity troponin 342, repeat 664.  WBC 18.9.  Review of Systems: Review of systems as noted in the HPI. All other systems reviewed and are negative.   Past Medical History:  Diagnosis Date   Aorto-iliac atherosclerosis (HCC) 01/06/2016   Asthma    COPD (chronic obstructive pulmonary disease) (HCC)    Hypertension    Osteoporosis    Tobacco abuse 01/06/2016   Vitamin D deficiency 01/06/2016   Past Surgical History:  Procedure Laterality Date   ANKLE FRACTURE SURGERY Left 11/07/2009   left ankle hardware removal     left ankle surgery     LEFT HEART CATH AND CORONARY ANGIOGRAPHY N/A 11/29/2022   Procedure: LEFT HEART CATH AND CORONARY ANGIOGRAPHY;  Surgeon: Iran Ouch, MD;  Location: [facility] INVASIVE CV LAB;  Service: Cardiovascular;  Laterality: N/A;   LOWER EXTREMITY ANGIOGRAPHY Left 10/17/2017   Procedure: LOWER EXTREMITY ANGIOGRAPHY;  Surgeon: Annice Needy, MD;  Location: [facility] INVASIVE CV LAB;  Service: Cardiovascular;  Laterality: Left;   TUBAL LIGATION      Social History:  reports that she has been smoking cigarettes. She has a 30.00 pack-year smoking history. She has never used smokeless tobacco. She reports that she does not drink alcohol and does not use drugs.   Allergies  Allergen Reactions   Peach Flavor Shortness Of Breath   Vicodin [Hydrocodone-Acetaminophen]  Nausea And Vomiting   Vicodin [Hydrocodone-Acetaminophen]     Abd pain, N/V   Hct [Hydrochlorothiazide] Palpitations   Tramadol Palpitations    Family History  Problem Relation Age of Onset   COPD Mother    Heart failure Mother    COPD Father    Heart failure Father       Prior to Admission medications   Medication Sig Start  Date End Date Taking? Authorizing Provider  acetaminophen (TYLENOL) 500 MG tablet Take 500-1,000 mg by mouth every 6 (six) hours as needed for mild pain or moderate pain.    [provider]  ALPRAZolam Prudy Feeler) 0.5 MG tablet Take 0.5 mg by mouth 4 (four) times daily as needed for anxiety.    [provider]  apixaban (ELIQUIS) 5 MG TABS tablet Take 1 tablet (5 mg total) by mouth 2 (two) times daily. 11/30/22   Enedina Finner, MD  aspirin EC 81 MG tablet Take 1 tablet (81 mg total) by mouth daily. Swallow whole. 12/01/22   Enedina Finner, MD  atorvastatin (LIPITOR) 40 MG tablet Take 1 tablet (40 mg total) by mouth at bedtime. 11/30/22   Enedina Finner, MD  carvedilol (COREG) 6.25 MG tablet Take 1 tablet (6.25 mg total) by mouth 2 (two) times daily with a meal. 11/30/22   Enedina Finner, MD  losartan (COZAAR) 100 MG tablet Take 1 tablet (100 mg total) by mouth daily. 12/01/22   Enedina Finner, MD  omeprazole (PRILOSEC) 40 MG capsule Take 40 mg by mouth daily.    [provider]  oxyCODONE-acetaminophen (PERCOCET) 7.5-325 MG tablet Take 1 tablet by mouth every 8 (eight) hours as needed for severe pain.    [provider]    Physical Exam: BP (!) 147/99 (BP Location: Left Arm)   Pulse 63   Temp 98.4 F (36.9 C) (Oral)   Resp 20   Ht 5\' 2"  (1.575 m)   Wt 76.7 kg   LMP 08/09/2008 (Within Years)   SpO2 94%   BMI 30.93 kg/m   General: 62 y.o. year-old female well developed well nourished in no acute distress.  Alert and oriented x3. Cardiovascular: Regular rate and rhythm with no rubs or gallops.  No thyromegaly or JVD noted.  No lower extremity edema. 2/4 pulses in all 4 extremities. Respiratory: Mild wheezing bilaterally anteriorly, right greater than left.  Poor inspiratory effort. Abdomen: Soft nontender nondistended with normal bowel sounds x4 quadrants. Muskuloskeletal: No cyanosis, clubbing or edema noted bilaterally Neuro: CN II-XII intact, strength, sensation,  reflexes Skin: No ulcerative lesions noted or rashes Psychiatry: Judgement and insight appear normal. Mood is appropriate for condition and setting          Labs on Admission:  Basic Metabolic Panel: Recent Labs  Lab 12/10/22 1537 12/10/22 1602  NA 138  --   K 2.9*  --   CL 107  --   CO2 20*  --   GLUCOSE 146*  --   BUN 12  --   CREATININE 0.79  --   CALCIUM 9.5  --   MG  --  1.8   Liver Function Tests: Recent Labs  Lab 12/10/22 1602  AST 21  ALT 16  ALKPHOS 64  BILITOT 0.8  PROT 6.4*  ALBUMIN 3.6   No results for input(s): "LIPASE", "AMYLASE" in the last 168 hours. No results for input(s): "AMMONIA" in the last 168 hours. CBC: Recent Labs  Lab 12/10/22 1537  WBC 18.9*  HGB 13.9  HCT 43.6  MCV 97.8  PLT 324   Cardiac Enzymes: No results for input(s): "CKTOTAL", "CKMB", "CKMBINDEX", "TROPONINI" in the last 168 hours.  BNP (last 3 results) Recent Labs    12/10/22 1602  BNP 75.0    ProBNP (last 3 results) No results for input(s): "PROBNP" in the last 8760 hours.  CBG: No results for input(s): "GLUCAP" in the last 168 hours.  Radiological Exams on Admission: DG Chest Port 1 View  Result Date: 12/10/2022 CLINICAL DATA:  Shortness of breath EXAM: PORTABLE CHEST 1 VIEW COMPARISON:  Chest x-ray 11/26/2022 FINDINGS: The heart is enlarged. There central pulmonary vascular congestion. There is no focal lung infiltrate, pleural effusion or pneumothorax. No acute fractures are seen. IMPRESSION: Cardiomegaly with central pulmonary vascular congestion. Electronically Signed   By: Darliss Cheney M.D.   On: 12/10/2022 16:27    EKG: I independently viewed the EKG done and my findings are as followed: Sinus rhythm rate of 60.  Nonspecific ST changes with QTc 486.  Assessment/Plan Present on Admission:  Wide-complex tachycardia  Principal Problem:   Wide-complex tachycardia  Wide-complex tachycardia, now resolved, on amiodarone drip Presented to the ER for  evaluation of sudden onset chest pain. Found to be severely tachycardic with heart rate in the 230s and hypotensive with SBP's in the 80s. EKG showed wide-complex tachycardia, due to hemodynamic instability, She received synchronized cardioversion at 150 converted to rapid A-fib/SVT. Received loading dose of amiodarone 200 mg IV and is currently on amiodarone drip. Optimize magnesium and potassium level Goal magnesium greater than 2.0, goal potassium greater than 4.0.  Elevated troponin, rule out ACS High-sensitivity troponin uptrending 664 from 342. Last 2D echo done on 11/28/2022 revealed LVEF 55 to 60% with grade 2 diastolic dysfunction.   The left ventricle has no regional wall motion abnormalities. Continue aspirin and Lipitor. Cardiology has been consulted, defer management to cardiology  Hypokalemia Serum potassium 2.9 Repleted orally Repeat BMP in the morning  Prediabetes with hyperglycemia Last hemoglobin A1c 5.9 on 11/27/2022. Presented with serum glucose 146. Start insulin sliding scale every 4 hours while NPO. N.p.o. until seen by cardiology.  Leukocytosis, SIRS, unclear source Possibly GI with nausea vomiting and diarrhea. Check UA and urine culture, no evidence of pneumonia on chest x-ray. Obtain peripheral blood cultures x 2, MRSA screening test. Presented with WBC 18.9, tachycardia, tachypnea Rule out active infective process Repeat CBC and monitor for fever.  Paroxysmal A-fib on Eliquis Resume home Eliquis Continue amiodarone drip Continue to closely monitor on telemetry.  Hyperlipidemia Resume home regimen.  Obesity BMI 30 Recommend weight loss outpatient with regular physical activity and healthy dieting.  Tobacco use disorder Smokes half a pack per day Tobacco cessation counseling on at bedside, the patient was receptive.   DVT prophylaxis: Home Eliquis.  Code Status: Full code  Family Communication: None at bedside.  Disposition Plan:  Admitted to progressive care unit  Consults called: Cardiology consulted.  Admission status: Inpatient status.    Status is: Inpatient The patient requires at least 2 midnights for further evaluation and treatment of present condition   Darlin Drop MD [facility] Pager [phone]  If 7PM-7AM, please contact night-coverage [email or web address] Password [facility]  12/11/2022, 2:39 AM

## 2022-12-11 NOTE — Plan of Care (Signed)

## 2022-12-11 NOTE — Progress Notes (Signed)
    Patient: Sheila [name] VWU:[id number] DOB: 1960-11-04      Brief hospital course: Sheila [name] is a 62 y.o. F with HTN, COPD, dCHF, and smoking who presented with chest pain radiating to her left arm.   5/3: Admitted to Sheila [name], found to have unstable wide complex tachycardia, emergently cardioverted --> developed rapit Afib and started on amiodarone gtt 5/4: Transferred to Sheila [name] for EP evaluation    This is a no charge note, for further details, please see the H&P by my partner, Sheila [name] from earlier today.   Principal Problem:   Wide-complex tachycardia Active Problems:   Essential hypertension   COPD (chronic obstructive pulmonary disease) (HCC)   Tobacco abuse   Hypokalemia   PAD (peripheral artery disease) (HCC)   Hyperlipidemia   Paroxysmal atrial fibrillation (HCC)   Obesity (BMI 30-39.9)   Pre-diabetes   Elevated troponin    Amiodarone per EP Monitor 24-48 hours then plan for d/c when cleared by EP       Physical Exam: BP 125/87 (BP Location: Left Arm)   Pulse 66   Temp 97.9 F (36.6 C) (Oral)   Resp 18   Ht 5\' 2"  (1.575 m)   Wt 76.7 kg   LMP 08/09/2008 (Within Years)   SpO2 99%   BMI 30.93 kg/m   Patient seen and examined.      Family Communication: Called to granddaughter, no answer, mailbox full        Author: Alberteen Sam, MD 12/11/2022 3:43 PM

## 2022-12-11 NOTE — ED Notes (Signed)
EMTALA reviewed by this RN.  

## 2022-12-11 NOTE — Consult Note (Signed)
Cardiology Consultation   Patient ID: Sheila [name] MRN: [id number]; DOB: 1960/10/13  Admit date: 12/11/2022 Date of Consult: 12/11/2022  PCP:  Alease Medina, MD   [facility] Providers Cardiologist:  Julien Nordmann, MD        Patient Profile:   Sheila [name] is a 62 y.o. female with a hx of  chronic tobacco abuse, COPD, hypertension, chronic diastolic heart failure, history of subclavian stenosis s/p intervention 10/17/2017 and recently diagnosed paroxysmal atrial fibrillation who is being seen 12/11/2022 for the evaluation of wide-complex tachycardia at the request of Dr. Maryfrances Bunnell.  History of Present Illness:   Sheila [name] is a 62 year old female with past medical history of chronic tobacco abuse, COPD, hypertension, chronic diastolic heart failure, history of subclavian stenosis s/p intervention 10/17/2017 and recently diagnosed paroxysmal atrial fibrillation.  She had normal echocardiogram and PET stress test at [facility] in January 2023. Patient was admitted to the [facility] hospital from 4/19 - 11/30/2022 after presented with chest pain and rapid palpitation.  Serial troponin trended up to 1400.  She was placed on IV Cardizem and self converted to normal sinus rhythm.  IV Cardizem was eventually switched to carvedilol 6.25 mg twice a day.  Carvedilol was unable to be uptitrated due to bradycardia and pauses up to 2.1-second.  Subsequent cardiac catheterization performed on 11/30/2022 demonstrated normal coronary arteries, LV gram was not performed given normal EF on echocardiogram, severely elevated systolic blood pressure with mild elevation of LVEDP.  Losartan was started for elevated blood pressure.  She was ultimately discharged on combination of aspirin and Eliquis due h/o PAD with carvedilol act as rate control.  After discharge, she has been doing well and resumed her daily activity.  She does have fatigue and occasional dizziness.    She was in her usual state of health on 12/10/2022  driving to [place] to pay her bills when she suddenly became very dizzy, diaphoretic and had substernal chest pain with rapid palpitation.  She called her granddaughter who took her to [facility] emergency room.  On arrival, she was tachycardic with heart rate in the 230s.  Initial rhythm showed wide-complex tachycardia which looks different from her previous rapid A-fib rhythm.  Systolic blood pressure was 83 mmHg.  Given unstable symptom, she underwent ED cardioversion at 150 J, she subsequently converted to rapid A-fib and SVT for which she received 300 mg IV amiodarone push.  Since starting on amiodarone drip, she has maintained sinus rhythm.  Based on EKG review from the emergency room, it appears right after cardioversion or amiodarone bolus, she did have junctional bradycardia with heart rate in the 30s as well.  She is currently maintaining normal sinus rhythm with heart rate in the 60s.  Chest x-ray showed cardiomegaly with central vascular congestion.  Serial troponin 119-->342-->664.  Her symptom has completely resolved after cardioversion.  Cardiology service consulted for wide-complex tachycardia.   Past Medical History:  Diagnosis Date   Aorto-iliac atherosclerosis (HCC) 01/06/2016   Asthma    COPD (chronic obstructive pulmonary disease) (HCC)    Hypertension    Osteoporosis    Tobacco abuse 01/06/2016   Vitamin D deficiency 01/06/2016    Past Surgical History:  Procedure Laterality Date   ANKLE FRACTURE SURGERY Left 11/07/2009   left ankle hardware removal     left ankle surgery     LEFT HEART CATH AND CORONARY ANGIOGRAPHY N/A 11/29/2022   Procedure: LEFT HEART CATH AND CORONARY ANGIOGRAPHY;  Surgeon: Iran Ouch,  MD;  Location: [facility] INVASIVE CV LAB;  Service: Cardiovascular;  Laterality: N/A;   LOWER EXTREMITY ANGIOGRAPHY Left 10/17/2017   Procedure: LOWER EXTREMITY ANGIOGRAPHY;  Surgeon: Annice Needy, MD;  Location: [facility] INVASIVE CV LAB;  Service: Cardiovascular;  Laterality: Left;    TUBAL LIGATION       Home Medications:  Prior to Admission medications   Medication Sig Start Date End Date Taking? Authorizing Provider  acetaminophen (TYLENOL) 500 MG tablet Take 500-1,000 mg by mouth every 6 (six) hours as needed for mild pain or moderate pain.   Yes [provider]  ALPRAZolam Prudy Feeler) 0.5 MG tablet Take 0.5 mg by mouth 4 (four) times daily as needed for anxiety.   Yes [provider]  apixaban (ELIQUIS) 5 MG TABS tablet Take 1 tablet (5 mg total) by mouth 2 (two) times daily. 11/30/22  Yes Enedina Finner, MD  aspirin EC 81 MG tablet Take 1 tablet (81 mg total) by mouth daily. Swallow whole. 12/01/22  Yes Enedina Finner, MD  atorvastatin (LIPITOR) 40 MG tablet Take 1 tablet (40 mg total) by mouth at bedtime. 11/30/22  Yes Enedina Finner, MD  carvedilol (COREG) 6.25 MG tablet Take 1 tablet (6.25 mg total) by mouth 2 (two) times daily with a meal. 11/30/22  Yes Enedina Finner, MD  losartan (COZAAR) 100 MG tablet Take 1 tablet (100 mg total) by mouth daily. 12/01/22  Yes Enedina Finner, MD  omeprazole (PRILOSEC) 40 MG capsule Take 40 mg by mouth daily.   Yes [provider]  oxyCODONE-acetaminophen (PERCOCET) 7.5-325 MG tablet Take 1 tablet by mouth every 8 (eight) hours as needed for severe pain.   Yes [provider]    Inpatient Medications: Scheduled Meds:  apixaban  5 mg Oral BID   aspirin EC  81 mg Oral Daily   atorvastatin  40 mg Oral QHS   carvedilol  3.125 mg Oral BID WC   Continuous Infusions:  amiodarone 30 mg/hr (12/11/22 0711)   PRN Meds: acetaminophen, ipratropium-albuterol, melatonin, prochlorperazine  Allergies:    Allergies  Allergen Reactions   Peach Flavor Shortness Of Breath   Vicodin [Hydrocodone-Acetaminophen] Nausea And Vomiting   Vicodin [Hydrocodone-Acetaminophen]     Abd pain, N/V   Hct [Hydrochlorothiazide] Palpitations   Tramadol Palpitations    Social History:   Social History   Socioeconomic History    Marital status: Divorced    Spouse name: Not on file   Number of children: Not on file   Years of education: Not on file   Highest education level: GED or equivalent  Occupational History   Occupation: unemployed  Tobacco Use   Smoking status: Every Day    Packs/day: 1.00    Years: 30.00    Additional pack years: 0.00    Total pack years: 30.00    Types: Cigarettes   Smokeless tobacco: Never  Vaping Use   Vaping Use: Never used  Substance and Sexual Activity   Alcohol use: No    Alcohol/week: 0.0 standard drinks of alcohol   Drug use: No   Sexual activity: Never  Other Topics Concern   Not on file  Social History Narrative   Food stamps for both her and friend she has been with for 23 years. Some months run low, but always make it.    Social Determinants of Health   Financial Resource Strain: Medium Risk (06/13/2018)   Overall Financial Resource Strain (CARDIA)    Difficulty of Paying Living Expenses: Somewhat hard  Food  Insecurity: No Food Insecurity (12/11/2022)   Hunger Vital Sign    Worried About Running Out of Food in the Last Year: Never true    Ran Out of Food in the Last Year: Never true  Transportation Needs: No Transportation Needs (12/11/2022)   PRAPARE - Administrator, Civil Service (Medical): No    Lack of Transportation (Non-Medical): No  Physical Activity: Sufficiently Active (01/10/2018)   Exercise Vital Sign    Days of Exercise per Week: 6 days    Minutes of Exercise per Session: 40 min  Stress: Stress Concern Present (01/10/2018)   [employer] of Occupational Health - Occupational Stress Questionnaire    Feeling of Stress : To some extent  Social Connections: Moderately Integrated (01/10/2018)   Social Connection and Isolation Panel [NHANES]    Frequency of Communication with Friends and Family: More than three times a week    Frequency of Social Gatherings with Friends and Family: Three times a week    Attends Religious Services: 1 to 4  times per year    Active Member of Clubs or Organizations: No    Attends Banker Meetings: Never    Marital Status: Living with partner  Intimate Partner Violence: Not At Risk (12/11/2022)   Humiliation, Afraid, Rape, and Kick questionnaire    Fear of Current or Ex-Partner: No    Emotionally Abused: No    Physically Abused: No    Sexually Abused: No    Family History:    Family History  Problem Relation Age of Onset   COPD Mother    Heart failure Mother    COPD Father    Heart failure Father      ROS:  Please see the history of present illness.   All other ROS reviewed and negative.     Physical Exam/Data:   Vitals:   12/11/22 0107 12/11/22 0439 12/11/22 0829  BP: (!) 147/99 (!) 127/92 (!) 153/88  Pulse: 63 63 60  Resp: 20    Temp: 98.4 F (36.9 C) 98 F (36.7 C) (!) 97.5 F (36.4 C)  TempSrc: Oral Oral Oral  SpO2: 94% 99% 96%  Weight: 76.7 kg    Height: 5\' 2"  (1.575 m)      Intake/Output Summary (Last 24 hours) at 12/11/2022 0914 Last data filed at 12/11/2022 0351 Gross per 24 hour  Intake 3.41 ml  Output --  Net 3.41 ml      12/11/2022    1:07 AM 11/29/2022   12:34 PM 11/26/2022    4:24 PM  Last 3 Weights  Weight (lbs) 169 lb 1.5 oz 169 lb 1.5 oz 169 lb  Weight (kg) 76.7 kg 76.7 kg 76.658 kg     Body mass index is 30.93 kg/m.  General:  Well nourished, well developed, in no acute distress HEENT: normal Neck: no JVD Vascular: No carotid bruits; Distal pulses 2+ bilaterally Cardiac:  normal S1, S2; RRR; no murmur  Lungs:  clear to auscultation bilaterally, no wheezing, rhonchi or rales  Abd: soft, nontender, no hepatomegaly  Ext: no edema Musculoskeletal:  No deformities, BUE and BLE strength normal and equal Skin: warm and dry  Neuro:  CNs 2-12 intact, no focal abnormalities noted Psych:  Normal affect   EKG:  The EKG was personally reviewed and demonstrates: Wide-complex tachycardia Telemetry:  Telemetry was personally reviewed and  demonstrates: Maintaining normal sinus rhythm since arrived at [facility].  Relevant CV Studies:  Echo 11/28/2022  1. Left ventricular ejection  fraction, by estimation, is 55 to 60%. The  left ventricle has normal function. The left ventricle has no regional  wall motion abnormalities. There is mild left ventricular hypertrophy.  Left ventricular diastolic parameters  are consistent with Grade II diastolic dysfunction (pseudonormalization).   2. Right ventricular systolic function is normal. The right ventricular  size is normal.   3. The mitral valve is normal in structure. No evidence of mitral valve  regurgitation.   4. The aortic valve was not well visualized. Aortic valve regurgitation  is not visualized. Aortic valve sclerosis is present, with no evidence of  aortic valve stenosis.   5. The inferior vena cava is normal in size with greater than 50%  respiratory variability, suggesting right atrial pressure of 3 mmHg.    Cath 11/29/2022 1.  Normal coronary arteries. 2.  Left ventricular angiography was not performed.  EF was normal by echo. 3.  Severely elevated systolic blood pressure with mildly elevated left ventricular end-diastolic pressure.   Recommendations: Suspect that elevated troponin is due to type II myocardial infarction due to A-fib with RVR and severely elevated blood pressure. Heparin drip to be resumed at 6 PM.  This can be transition to Eliquis tomorrow. Continue blood pressure control.  I increased both carvedilol and losartan.  Laboratory Data:  High Sensitivity Troponin:   Recent Labs  Lab 11/26/22 1815 11/27/22 0423 12/10/22 1537 12/10/22 1900 12/10/22 2024  TROPONINIHS 1,473* 1,086* 109* 342* 664*     Chemistry Recent Labs  Lab 12/10/22 1537 12/10/22 1602 12/11/22 0347  NA 138  --  138  K 2.9*  --  4.7  CL 107  --  113*  CO2 20*  --  19*  GLUCOSE 146*  --  112*  BUN 12  --  9  CREATININE 0.79  --  0.72  CALCIUM 9.5  --  9.9  MG  --  1.8  2.4  GFRNONAA >60  --  >60  ANIONGAP 11  --  6    Recent Labs  Lab 12/10/22 1602 12/11/22 0347  PROT 6.4* 6.3*  ALBUMIN 3.6 3.6  AST 21 22  ALT 16 18  ALKPHOS 64 68  BILITOT 0.8 0.3   Lipids No results for input(s): "CHOL", "TRIG", "HDL", "LABVLDL", "LDLCALC", "CHOLHDL" in the last 168 hours.  Hematology Recent Labs  Lab 12/10/22 1537 12/11/22 0347  WBC 18.9* 15.0*  RBC 4.46 4.46  HGB 13.9 14.1  HCT 43.6 44.0  MCV 97.8 98.7  MCH 31.2 31.6  MCHC 31.9 32.0  RDW 13.4 13.7  PLT 324 295   Thyroid  Recent Labs  Lab 12/10/22 1602  TSH 2.391    BNP Recent Labs  Lab 12/10/22 1602  BNP 75.0    DDimer No results for input(s): "DDIMER" in the last 168 hours.   Radiology/Studies:  DG Chest Port 1 View  Result Date: 12/10/2022 CLINICAL DATA:  Shortness of breath EXAM: PORTABLE CHEST 1 VIEW COMPARISON:  Chest x-ray 11/26/2022 FINDINGS: The heart is enlarged. There central pulmonary vascular congestion. There is no focal lung infiltrate, pleural effusion or pneumothorax. No acute fractures are seen. IMPRESSION: Cardiomegaly with central pulmonary vascular congestion. Electronically Signed   By: Darliss Cheney M.D.   On: 12/10/2022 16:27     Assessment and Plan:   Wide-complex tachycardia  -Underwent urgent ED cardioversion yesterday at [facility] with report of conversion to rapid A-fib and SVT, subsequently loaded with 300 mg IV amiodarone followed by amiodarone drip.  Serial EKG  has been reviewed, it appears after the cardioversion or amiodarone bolus, patient also had junctional bradycardia with heart rate in the 30s.  Unfortunately I am unable to pull up telemetry from [facility] emergency room.  -Currently maintaining sinus rhythm on 3.125 mg twice a day of carvedilol and IV amiodarone.  Was on 6.25 mg twice a day of carvedilol at home.  -Unclear nature behind wide-complex tachycardia, not sure if atrial flutter versus SVT with aberrancy.  Looking back, when she had rapid atrial  fibrillation on 11/26/2022, she had narrow QRS at the time.  Recent cardiac catheterization showed normal coronary arteries.  Elevated troponin: Elevated serial troponin in the setting of wide-complex tachycardia with heart rate close to 230 bpm.  Consistent with demand ischemia.  Recently diagnosed PAF: On Eliquis and carvedilol 6.25 mg twice a day.  COPD: No signs of acute exacerbation.  Hypertension: On carvedilol and losartan at home.  Chronic diastolic heart failure: Central vascular congestion seen on chest x-ray.  On physical exam, she appears to be euvolemic.  History of subclavian stenosis status post intervention 10/17/2017   Risk Assessment/Risk Scores:          CHA2DS2-VASc Score = 3   This indicates a 3.2% annual risk of stroke. The patient's score is based upon: CHF History: 1 HTN History: 1 Diabetes History: 0 Stroke History: 0 Vascular Disease History: 0 Age Score: 0 Gender Score: 1         For questions or updates, please contact [facility] Please consult [email or web address] for contact info under    Ramond Dial, [place]  12/11/2022 9:14 AM

## 2022-12-12 DIAGNOSIS — R000 Tachycardia, unspecified: Secondary | ICD-10-CM | POA: Diagnosis not present

## 2022-12-12 LAB — BASIC METABOLIC PANEL
Anion gap: 7 (ref 5–15)
BUN: 12 mg/dL (ref 8–23)
CO2: 22 mmol/L (ref 22–32)
Calcium: 10.4 mg/dL — ABNORMAL HIGH (ref 8.9–10.3)
Chloride: 108 mmol/L (ref 98–111)
Creatinine, Ser: 0.9 mg/dL (ref 0.44–1.00)
GFR, Estimated: >60 mL/min (ref >60)
Glucose, Bld: 89 mg/dL (ref 70–99)
Potassium: 3.8 mmol/L (ref 3.5–5.1)
Sodium: 137 mmol/L (ref 135–145)

## 2022-12-12 LAB — CBC
HCT: 47 % — ABNORMAL HIGH (ref 36.0–46.0)
Hemoglobin: 14.9 g/dL (ref 12.0–15.0)
MCH: 30.9 pg (ref 26.0–34.0)
MCHC: 31.7 g/dL (ref 30.0–36.0)
MCV: 97.5 fL (ref 80.0–100.0)
Platelets: 364 10*3/uL (ref 150–400)
RBC: 4.82 MIL/uL (ref 3.87–5.11)
RDW: 13.5 % (ref 11.5–15.5)
WBC: 14.2 10*3/uL — ABNORMAL HIGH (ref 4.0–10.5)
nRBC: 0 % (ref 0.0–0.2)

## 2022-12-12 LAB — CULTURE, BLOOD (ROUTINE X 2)

## 2022-12-12 MED ORDER — AMIODARONE HCL 200 MG PO TABS: ORAL_TABLET | ORAL | 2 refills | Status: DC

## 2022-12-12 MED ORDER — CARVEDILOL 3.125 MG PO TABS: 3.1250 mg | ORAL_TABLET | Freq: Two times a day (BID) | ORAL | 2 refills | Status: DC

## 2022-12-12 NOTE — Progress Notes (Signed)
   Rounding Note    Patient Name: COURTLYNN MINUS Date of Encounter: 12/12/2022  [facility] Cardiologist: Julien Nordmann, MD   Subjective   NAEO. Feels well today.   Vital Signs    Vitals:   12/11/22 1136 12/11/22 1508 12/11/22 1938 12/12/22 0916  BP: 136/89 125/87 129/86 115/80  Pulse:  66 61 65  Resp: 18 18 18 18   Temp: 98.9 F (37.2 C) 97.9 F (36.6 C) 98.2 F (36.8 C) 98.2 F (36.8 C)  TempSrc: Oral Oral Oral Oral  SpO2:  99% 99%   Weight:      Height:        Intake/Output Summary (Last 24 hours) at 12/12/2022 0937 Last data filed at 12/12/2022 0600 Gross per 24 hour  Intake 137.91 ml  Output 500 ml  Net -362.09 ml      12/11/2022    1:07 AM 11/29/2022   12:34 PM 11/26/2022    4:24 PM  Last 3 Weights  Weight (lbs) 169 lb 1.5 oz 169 lb 1.5 oz 169 lb  Weight (kg) 76.7 kg 76.7 kg 76.658 kg      Telemetry    Personally Reviewed  ECG    Personally Reviewed  Physical Exam    GEN: No acute distress.   Cardiac: RRR, no murmurs, rubs, or gallops.  Respiratory: Clear to auscultation bilaterally. Psych: Normal affect   Assessment & Plan    #WCT #AF #AFL In sinus rhythm. Possibly triggered by significant hypokalemia. Cont Eliquis BID Cont Amiodarone with plans to have ablation as outpatient.  OK to discharge.    Sheria Lang T. Lalla Brothers, MD, [facility], [facility] Cardiac Electrophysiology

## 2022-12-12 NOTE — Discharge Summary (Signed)
Physician Discharge Summary   Patient: Sheila [name] MRN: [id number] DOB: Oct 25, 1960  Admit date:     12/11/2022  Discharge date: 12/12/22  Discharge Physician: Alberteen Sam   PCP: Alease Medina, MD     Recommendations at discharge:  Follow up with Cardiology as directed for tachyarrhythmia     Discharge Diagnoses: Principal Problem:   Wide-complex tachycardia, likely Afib with aberrancy Active Problems:   Paroxysmal atrial fibrillation (HCC)   Essential hypertension   COPD (chronic obstructive pulmonary disease) (HCC)   Tobacco abuse   Hypokalemia   PAD (peripheral artery disease) (HCC)   Hyperlipidemia   Obesity (BMI 30-39.9)   Pre-diabetes   Demand ischemia     Hospital Course: Sheila [name] is a 62 y.o. F with HTN, COPD, dCHF, and smoking who presented with chest pain radiating to her left arm.   Wide complex tachycardia Admitted to [facility], found to have unstable wide complex tachycardia, emergently cardioverted.  After cardioversion, had rapid atrial fibrillation, started on amiodarone infusion and transferred to [facility] for EP evaluation.  HR here stable.  Cardiology reviewed strips and felt that her episode was not a ventricular tachycardia, more likely Afib with aberrancy, likely triggered by hypokalemia.  Transitioned to oral amiodarone and discharged with plans for outpatient ablation.             The [facility] Controlled Substances Registry was reviewed for this patient prior to discharge.   Consultants: Cardiology Dr. Lalla Brothers Procedures performed: Emergency Cardioversion  Disposition: Home Diet recommendation:  Discharge Diet Orders (From admission, onward)     Start     Ordered   12/12/22 0000  Diet - low sodium heart healthy        12/12/22 1003             DISCHARGE MEDICATION: Allergies as of 12/12/2022       Reactions   Peach Flavor Shortness Of Breath   Vicodin [hydrocodone-acetaminophen] Nausea And Vomiting    Vicodin [hydrocodone-acetaminophen]    Abd pain, N/V   Hct [hydrochlorothiazide] Palpitations   Tramadol Palpitations        Medication List     TAKE these medications    acetaminophen 500 MG tablet Commonly known as: TYLENOL Take 500-1,000 mg by mouth every 6 (six) hours as needed for mild pain or moderate pain.   ALPRAZolam 0.5 MG tablet Commonly known as: XANAX Take 0.5 mg by mouth 4 (four) times daily as needed for anxiety.   amiodarone 200 MG tablet Commonly known as: PACERONE Table 2 tablets twice a day for 5 days, then 2 tablets daily for 5 days, then 1 tablet daily thereafter Start taking on: Dec 21, 2022   apixaban 5 MG Tabs tablet Commonly known as: ELIQUIS Take 1 tablet (5 mg total) by mouth 2 (two) times daily.   aspirin EC 81 MG tablet Take 1 tablet (81 mg total) by mouth daily. Swallow whole.   atorvastatin 40 MG tablet Commonly known as: LIPITOR Take 1 tablet (40 mg total) by mouth at bedtime.   carvedilol 3.125 MG tablet Commonly known as: COREG Take 1 tablet (3.125 mg total) by mouth 2 (two) times daily with a meal. What changed:  medication strength how much to take   losartan 100 MG tablet Commonly known as: COZAAR Take 1 tablet (100 mg total) by mouth daily.   omeprazole 40 MG capsule Commonly known as: PRILOSEC Take 40 mg by mouth daily.   oxyCODONE-acetaminophen 7.5-325 MG tablet Commonly  known as: PERCOCET Take 1 tablet by mouth every 8 (eight) hours as needed for severe pain.        Follow-up Information     Gollan, Tollie Pizza, MD Follow up.   Specialty: Cardiology Why: office scheduler will contact you to arrange general cardiology follow up Contact information: [address] [place] [address] [phone]         Lanier Prude, MD Follow up.   Specialties: Cardiology, Radiology Why: office scheduler will contact you to arrange electrophysiology follow up Contact information: [address] [place] [address] [phone]                 Discharge Instructions     Diet - low sodium heart healthy   Complete by: As directed    Discharge instructions   Complete by: As directed    **IMPORTANT DISCHARGE INSTRUCTIONS**   From Dr. Maryfrances Bunnell: You were admitted for chest pain, dizziness and sweats. Here we found that this was from an abnormal (extremely fast) heart rate.  This was probably the abnormal heart rhythm atrial fibrillation, just extremely fast.  You should CONTINUE your blood thinner apixaban/Eliquis lifelong  You should START the new heart rate controlling medicine amiodarone You will take a higher dose for the next 10 days: Table 2 tablets (400 mg) TWICE A DAY for 5 days, then 2 tablets (400 mg) ONCE DAILY for 5 days, then 1 tablet (200 mg) daily thereafter  You will need to follow up with Dr. Mariah Milling (your heart doctor) AND ALSO with Dr. Lalla Brothers (the electrophysiology, EP doctor)  REDUCE your carvedilol for now  Continue your other home medicines   Increase activity slowly   Complete by: As directed        Discharge Exam: Filed Weights   12/11/22 0107  Weight: 76.7 kg    General: Pt is alert, awake, not in acute distress Cardiovascular: RRR, nl S1-S2, no murmurs appreciated.   No LE edema.   Respiratory: Normal respiratory rate and rhythm.  CTAB without rales or wheezes. Abdominal: Abdomen soft and non-tender.  No distension or HSM.   Neuro/Psych: Strength symmetric in upper and lower extremities.  Judgment and insight appear normal.   Condition at discharge: good  The results of significant diagnostics from this hospitalization (including imaging, microbiology, ancillary and laboratory) are listed below for reference.   Imaging Studies: DG Chest Port 1 View  Result Date: 12/10/2022 CLINICAL DATA:  Shortness of breath EXAM: PORTABLE CHEST 1 VIEW COMPARISON:  Chest x-ray 11/26/2022 FINDINGS: The heart is enlarged. There  central pulmonary vascular congestion. There is no focal lung infiltrate, pleural effusion or pneumothorax. No acute fractures are seen. IMPRESSION: Cardiomegaly with central pulmonary vascular congestion. Electronically Signed   By: Darliss Cheney M.D.   On: 12/10/2022 16:27   CARDIAC CATHETERIZATION  Result Date: 11/29/2022 1.  Normal coronary arteries. 2.  Left ventricular angiography was not performed.  EF was normal by echo. 3.  Severely elevated systolic blood pressure with mildly elevated left ventricular end-diastolic pressure. Recommendations: Suspect that elevated troponin is due to type II myocardial infarction due to A-fib with RVR and severely elevated blood pressure. Heparin drip to be resumed at 6 PM.  This can be transition to Eliquis tomorrow. Continue blood pressure control.  I increased both carvedilol and losartan.   ECHOCARDIOGRAM COMPLETE  Result Date: 11/28/2022    ECHOCARDIOGRAM REPORT   Patient Name:   TALAYSIA VELAZQUES Date  of Exam: 11/28/2022 Medical Rec #:  [id number]       Height:       62.0 in Accession #:    [id number]      Weight:       169.0 lb Date of Birth:  June 25, 1961        BSA:          1.780 m Patient Age:    61 years        BP:           172/80 mmHg Patient Gender: F               HR:           56 bpm. Exam Location:  [facility] Procedure: 2D Echo Indications:     NSTEMI I21.4  History:         Patient has prior history of Echocardiogram examinations, most                  recent 02/28/2017.  Sonographer:     Overton Mam RDCS, FASE Referring Phys:  [id number] AMY N COX Diagnosing Phys: Debbe Odea MD  Sonographer Comments: Technically difficult study due to poor echo windows. Image acquisition challenging due to respiratory motion. IMPRESSIONS  1. Left ventricular ejection fraction, by estimation, is 55 to 60%. The left ventricle has normal function. The left ventricle has no regional wall motion abnormalities. There is mild left ventricular hypertrophy. Left ventricular  diastolic parameters are consistent with Grade II diastolic dysfunction (pseudonormalization).  2. Right ventricular systolic function is normal. The right ventricular size is normal.  3. The mitral valve is normal in structure. No evidence of mitral valve regurgitation.  4. The aortic valve was not well visualized. Aortic valve regurgitation is not visualized. Aortic valve sclerosis is present, with no evidence of aortic valve stenosis.  5. The inferior vena cava is normal in size with greater than 50% respiratory variability, suggesting right atrial pressure of 3 mmHg. FINDINGS  Left Ventricle: Left ventricular ejection fraction, by estimation, is 55 to 60%. The left ventricle has normal function. The left ventricle has no regional wall motion abnormalities. The left ventricular internal cavity size was normal in size. There is  mild left ventricular hypertrophy. Left ventricular diastolic parameters are consistent with Grade II diastolic dysfunction (pseudonormalization). Right Ventricle: The right ventricular size is normal. No increase in right ventricular wall thickness. Right ventricular systolic function is normal. Left Atrium: Left atrial size was normal in size. Right Atrium: Right atrial size was normal in size. Pericardium: There is no evidence of pericardial effusion. Mitral Valve: The mitral valve is normal in structure. No evidence of mitral valve regurgitation. Tricuspid Valve: The tricuspid valve is not well visualized. Tricuspid valve regurgitation is not demonstrated. Aortic Valve: The aortic valve was not well visualized. Aortic valve regurgitation is not visualized. Aortic valve sclerosis is present, with no evidence of aortic valve stenosis. Aortic valve peak gradient measures 7.1 mmHg. Pulmonic Valve: The pulmonic valve was not well visualized. Pulmonic valve regurgitation is not visualized. Aorta: The aortic root and ascending aorta are structurally normal, with no evidence of dilitation.  Venous: The inferior vena cava is normal in size with greater than 50% respiratory variability, suggesting right atrial pressure of 3 mmHg. IAS/Shunts: No atrial level shunt detected by color flow Doppler.  LEFT VENTRICLE PLAX 2D LVIDd:         4.50 cm   Diastology LVIDs:  3.20 cm   LV e' medial:    4.79 cm/s LV PW:         1.50 cm   LV E/e' medial:  18.3 LV IVS:        1.60 cm   LV e' lateral:   4.46 cm/s LVOT diam:     2.00 cm   LV E/e' lateral: 19.6 LV SV:         55 LV SV Index:   31 LVOT Area:     3.14 cm  RIGHT VENTRICLE RV Basal diam:  2.90 cm RV S prime:     12.40 cm/s TAPSE (M-mode): 1.6 cm LEFT ATRIUM             Index        RIGHT ATRIUM           Index LA diam:        4.60 cm 2.58 cm/m   RA Area:     18.40 cm LA Vol (A2C):   41.5 ml 23.32 ml/m  RA Volume:   47.30 ml  26.58 ml/m LA Vol (A4C):   20.8 ml 11.69 ml/m LA Biplane Vol: 30.5 ml 17.14 ml/m  AORTIC VALVE                 PULMONIC VALVE AV Area (Vmax): 1.98 cm     PV Vmax:        0.83 m/s AV Vmax:        133.00 cm/s  PV Peak grad:   2.8 mmHg AV Peak Grad:   7.1 mmHg     RVOT Peak grad: 3 mmHg LVOT Vmax:      83.80 cm/s LVOT Vmean:     57.200 cm/s LVOT VTI:       0.174 m  AORTA Ao Root diam: 2.80 cm Ao Asc diam:  2.80 cm MITRAL VALVE MV Area (PHT): 2.21 cm    SHUNTS MV Decel Time: 344 msec    Systemic VTI:  0.17 m MV E velocity: 87.50 cm/s  Systemic Diam: 2.00 cm MV A velocity: 97.50 cm/s MV E/A ratio:  0.90 Debbe Odea MD Electronically signed by Debbe Odea MD Signature Date/Time: 11/28/2022/2:08:58 PM    Final    DG Chest Port 1 View  Result Date: 11/26/2022 CLINICAL DATA:  Chest pain EXAM: PORTABLE CHEST 1 VIEW COMPARISON:  X-ray 02/26/2017.  CT 01/30/2018 FINDINGS: Overlapping cardiac leads and defibrillator pads. Enlarged cardiopericardial silhouette with some vascular congestion. No consolidation, pneumothorax or effusion. IMPRESSION: Enlarged cardiopericardial silhouette with some vascular congestion.  Electronically Signed   By: Karen Kays M.D.   On: 11/26/2022 15:57    Microbiology: Results for orders placed or performed during the hospital encounter of 12/11/22  MRSA Next Gen by PCR, Nasal     Status: None   Collection Time: 12/11/22  3:03 AM   Specimen: Nasal Mucosa; Nasal Swab  Result Value Ref Range Status   MRSA by PCR Next Gen NOT DETECTED NOT DETECTED Final    Comment: (NOTE) The GeneXpert MRSA Assay (FDA approved for NASAL specimens only), is one component of a comprehensive MRSA colonization surveillance program. It is not intended to diagnose MRSA infection nor to guide or monitor treatment for MRSA infections. Test performance is not FDA approved in patients less than 73 years old. Performed at [facility], [address] [place], [place] [address]   Culture, blood (Routine X 2) w Reflex to ID Panel     Status:  None (Preliminary result)   Collection Time: 12/11/22  3:47 AM   Specimen: BLOOD LEFT ARM  Result Value Ref Range Status   Specimen Description BLOOD LEFT ARM  Final   Special Requests   Final    BOTTLES DRAWN AEROBIC AND ANAEROBIC Blood Culture results may not be optimal due to an excessive volume of blood received in culture bottles   Culture   Final    NO GROWTH 1 DAY Performed at [facility], [address] [place], [place] [address]    Report Status PENDING  Incomplete  Culture, blood (Routine X 2) w Reflex to ID Panel     Status: None (Preliminary result)   Collection Time: 12/11/22  3:47 AM   Specimen: BLOOD LEFT ARM  Result Value Ref Range Status   Specimen Description BLOOD LEFT ARM  Final   Special Requests   Final    BOTTLES DRAWN AEROBIC AND ANAEROBIC Blood Culture results may not be optimal due to an excessive volume of blood received in culture bottles   Culture   Final    NO GROWTH 1 DAY Performed at [facility], [address], [place], [place] [address]    Report Status PENDING  Incomplete     Labs: CBC: Recent Labs  Lab 12/10/22 1537 12/11/22 0347 12/12/22 0155  WBC 18.9* 15.0* 14.2*  HGB 13.9 14.1 14.9  HCT 43.6 44.0 47.0*  MCV 97.8 98.7 97.5  PLT 324 295 364   Basic Metabolic Panel: Recent Labs  Lab 12/10/22 1537 12/10/22 1602 12/11/22 0347 12/12/22 0155  NA 138  --  138 137  K 2.9*  --  4.7 3.8  CL 107  --  113* 108  CO2 20*  --  19* 22  GLUCOSE 146*  --  112* 89  BUN 12  --  9 12  CREATININE 0.79  --  0.72 0.90  CALCIUM 9.5  --  9.9 10.4*  MG  --  1.8 2.4  --   PHOS  --   --  2.8  --    Liver Function Tests: Recent Labs  Lab 12/10/22 1602 12/11/22 0347  AST 21 22  ALT 16 18  ALKPHOS 64 68  BILITOT 0.8 0.3  PROT 6.4* 6.3*  ALBUMIN 3.6 3.6   CBG: No results for input(s): "GLUCAP" in the last 168 hours.  Discharge time spent: approximately 35 minutes spent on discharge counseling, evaluation of patient on day of discharge, and coordination of discharge planning with nursing, social work, pharmacy and case management  Signed: Alberteen Sam, MD [facility] 12/12/2022

## 2022-12-12 NOTE — Plan of Care (Signed)

## 2022-12-13 LAB — CULTURE, BLOOD (ROUTINE X 2): Culture: NO GROWTH

## 2022-12-14 LAB — CULTURE, BLOOD (ROUTINE X 2)

## 2022-12-15 LAB — CULTURE, BLOOD (ROUTINE X 2): Culture: NO GROWTH

## 2022-12-16 LAB — CULTURE, BLOOD (ROUTINE X 2)

## 2022-12-31 ENCOUNTER — Encounter: Payer: Self-pay | Admitting: Physician Assistant

## 2022-12-31 ENCOUNTER — Telehealth: Payer: Self-pay | Admitting: *Deleted

## 2022-12-31 ENCOUNTER — Other Ambulatory Visit: Payer: Self-pay

## 2022-12-31 ENCOUNTER — Other Ambulatory Visit
Admission: RE | Admit: 2022-12-31 | Discharge: 2022-12-31 | Disposition: A | Discharge status: Home / Self Care | Payer: Medicaid Other | Source: Ambulatory Visit | Attending: Physician Assistant | Admitting: Physician Assistant

## 2022-12-31 ENCOUNTER — Ambulatory Visit: Payer: Medicaid Other | Attending: Physician Assistant | Admitting: Physician Assistant

## 2022-12-31 ENCOUNTER — Ambulatory Visit (INDEPENDENT_AMBULATORY_CARE_PROVIDER_SITE_OTHER): Payer: Medicaid Other

## 2022-12-31 VITALS — BP 185/93 | HR 57 | Ht 62.0 in | Wt 161.4 lb

## 2022-12-31 DIAGNOSIS — I4892 Unspecified atrial flutter: Secondary | ICD-10-CM

## 2022-12-31 DIAGNOSIS — R55 Syncope and collapse: Secondary | ICD-10-CM

## 2022-12-31 DIAGNOSIS — I10 Essential (primary) hypertension: Secondary | ICD-10-CM | POA: Insufficient documentation

## 2022-12-31 DIAGNOSIS — I480 Paroxysmal atrial fibrillation: Secondary | ICD-10-CM

## 2022-12-31 DIAGNOSIS — R000 Tachycardia, unspecified: Secondary | ICD-10-CM

## 2022-12-31 DIAGNOSIS — J449 Chronic obstructive pulmonary disease, unspecified: Secondary | ICD-10-CM

## 2022-12-31 DIAGNOSIS — E782 Mixed hyperlipidemia: Secondary | ICD-10-CM

## 2022-12-31 DIAGNOSIS — I771 Stricture of artery: Secondary | ICD-10-CM

## 2022-12-31 DIAGNOSIS — Z720 Tobacco use: Secondary | ICD-10-CM

## 2022-12-31 DIAGNOSIS — E876 Hypokalemia: Secondary | ICD-10-CM

## 2022-12-31 DIAGNOSIS — I5032 Chronic diastolic (congestive) heart failure: Secondary | ICD-10-CM

## 2022-12-31 LAB — CBC
HCT: 44 % (ref 36.0–46.0)
Hemoglobin: 14 g/dL (ref 12.0–15.0)
MCH: 30.5 pg (ref 26.0–34.0)
MCHC: 31.8 g/dL (ref 30.0–36.0)
MCV: 95.9 fL (ref 80.0–100.0)
Platelets: 436 10*3/uL — ABNORMAL HIGH (ref 150–400)
RBC: 4.59 MIL/uL (ref 3.87–5.11)
RDW: 13.5 % (ref 11.5–15.5)
WBC: 12.1 10*3/uL — ABNORMAL HIGH (ref 4.0–10.5)
nRBC: 0 % (ref 0.0–0.2)

## 2022-12-31 LAB — BASIC METABOLIC PANEL
Anion gap: 6 (ref 5–15)
BUN: 7 mg/dL — ABNORMAL LOW (ref 8–23)
CO2: 25 mmol/L (ref 22–32)
Calcium: 9.6 mg/dL (ref 8.9–10.3)
Chloride: 109 mmol/L (ref 98–111)
Creatinine, Ser: 0.77 mg/dL (ref 0.44–1.00)
GFR, Estimated: >60 mL/min (ref >60)
Glucose, Bld: 93 mg/dL (ref 70–99)
Potassium: 3.5 mmol/L (ref 3.5–5.1)
Sodium: 140 mmol/L (ref 135–145)

## 2022-12-31 LAB — MAGNESIUM: Magnesium: 2 mg/dL (ref 1.7–2.4)

## 2022-12-31 MED ORDER — POTASSIUM CHLORIDE ER 10 MEQ PO TBCR: 10.0000 meq | EXTENDED_RELEASE_TABLET | Freq: Every day | ORAL | 3 refills | Status: DC

## 2022-12-31 MED ORDER — AMLODIPINE BESYLATE 5 MG PO TABS: 5.0000 mg | ORAL_TABLET | Freq: Every day | ORAL | 3 refills | Status: DC

## 2022-12-31 MED ORDER — AMLODIPINE BESYLATE 5 MG PO TABS
5.0000 mg | ORAL_TABLET | Freq: Every day | ORAL | 3 refills | Status: DC
  Filled 2022-12-31: qty 90, 90d supply, fill #0

## 2022-12-31 NOTE — Telephone Encounter (Signed)
-----   Message from Sondra Barges, [place] sent at 12/31/2022 12:09 PM EDT ----- Kidney function normal Potassium low normal WBC count mildly elevated, consistent with prior readings With count mildly elevated Blood count stable Magnesium normal  Recommendations: -Start KCl 10 mEq daily -Recheck BMP 1 week after starting KCl -Follow-up with PCP for leukocytosis if not already evaluated

## 2022-12-31 NOTE — Progress Notes (Signed)
Cardiology Office Note    Date:  12/31/2022   ID:  DEVYANI SAELEE, DOB 08/02/1961, MRN [id number]  PCP:  Alease Medina, MD  Cardiologist:  Julien Nordmann, MD  Electrophysiologist:  None   Chief Complaint: Hospital follow-up  History of Present Illness:   Sheila [name] is a 62 y.o. female with history of coronary arteries by LHC in 11/2022, PAF, atrial flutter, possible SVT, WCT, HFpEF, PAD and subclavian stenosis status post stenting in 2019, HTN, HLD, and COPD with tobacco use who presents for hospital follow-up as outlined below.  She was admitted to [facility] in 11/2022 with A-fib with RVR and NSTEMI.  High-sensitivity troponin trended up to 1400.  She converted to sinus rhythm with IV Cardizem.  Echo showed an EF of 55 to 60%, no regional wall motion abnormalities, mild LVH, grade 2 diastolic dysfunction, normal RV systolic function and ventricular cavity size, and aortic valve sclerosis without evidence of stenosis.  LHC during the admission showed normal coronary arteries with mildly elevated LVEDP.  She was discharged on apixaban and carvedilol.  She presented to [facility] on 12/10/2022 with sudden onset of dizziness, diaphoresis, substernal chest pain, and palpitations.  Upon arrival she was tachycardic rates in the 230s bpm.  Initial rhythm showed WCT which appeared different from her prior A-fib.  Systolic blood pressure was 83 mmHg.  She underwent emergent DCCV with subsequent conversion to rapid A-fib and SVT, for which she received 300 mg of IV amiodarone push, and was started on amiodarone drip with maintenance of sinus rhythm.  It was also noted after cardioversion versus amiodarone bolus that she had junctional bradycardia with rates in the 30s bpm as well.  She was subsequently transferred to [facility] where she remained in sinus rhythm and was evaluated by EP with initial rhythm felt to be atrial flutter versus SVT with aberrancy.  EP recommended continuing amiodarone and apixaban with  favoring of EP study and catheter ablation of A-fib, flutter, and possible SVT in the outpatient setting.  She comes in doing well from a cardiac perspective and is without symptoms of angina or cardiac decompensation.  She does continue to note intermittent palpitations, though not as long-lasting or severe as what led up to her hospital admission.  Occasionally, these palpitations are associated with dizziness/presyncope.  She is without frank syncope.  Palpitations will typically last 1 to 2 minutes with spontaneous resolution.  No significant lower extremity swelling, abdominal distention, orthopnea, or early satiety.  No falls, hematochezia, or melena.  She is adherent and tolerating all cardiac medications without issues.  Blood pressure at home is somewhat labile ranging from 108 systolic to the 180s systolic with most readings in the 130s to 140s systolic.  No right radial arteriotomy site complications.   Labs independently reviewed: 12/2022 - potassium 3.8, BUN 12, serum creatinine 0.9, Hgb 14.9, PLT 364, magnesium 2.4, albumin 3.6, AST/ALT normal, TSH normal 11/2022 - TC 193, TG 188, HDL 39, LDL 116  Past Medical History:  Diagnosis Date   Aorto-iliac atherosclerosis (HCC) 01/06/2016   Asthma    COPD (chronic obstructive pulmonary disease) (HCC)    Hypertension    Osteoporosis    Tobacco abuse 01/06/2016   Vitamin D deficiency 01/06/2016    Past Surgical History:  Procedure Laterality Date   ANKLE FRACTURE SURGERY Left 11/07/2009   left ankle hardware removal     left ankle surgery     LEFT HEART CATH AND CORONARY ANGIOGRAPHY N/A 11/29/2022  Procedure: LEFT HEART CATH AND CORONARY ANGIOGRAPHY;  Surgeon: Iran Ouch, MD;  Location: [facility] INVASIVE CV LAB;  Service: Cardiovascular;  Laterality: N/A;   LOWER EXTREMITY ANGIOGRAPHY Left 10/17/2017   Procedure: LOWER EXTREMITY ANGIOGRAPHY;  Surgeon: Annice Needy, MD;  Location: [facility] INVASIVE CV LAB;  Service: Cardiovascular;   Laterality: Left;   TUBAL LIGATION      Current Medications: Current Meds  Medication Sig   acetaminophen (TYLENOL) 500 MG tablet Take 500-1,000 mg by mouth every 6 (six) hours as needed for mild pain or moderate pain.   albuterol (PROVENTIL) (2.5 MG/3ML) 0.083% nebulizer solution Take 2.5 mg by nebulization 3 (three) times daily.   ALPRAZolam (XANAX) 0.5 MG tablet Take 0.5 mg by mouth 4 (four) times daily as needed for anxiety.   amiodarone (PACERONE) 200 MG tablet Table 2 tablets twice a day for 5 days, then 2 tablets daily for 5 days, then 1 tablet daily thereafter   amLODipine (NORVASC) 5 MG tablet Take 1 tablet (5 mg total) by mouth daily.   apixaban (ELIQUIS) 5 MG TABS tablet Take 1 tablet (5 mg total) by mouth 2 (two) times daily.   aspirin EC 81 MG tablet Take 1 tablet (81 mg total) by mouth daily. Swallow whole.   atorvastatin (LIPITOR) 40 MG tablet Take 1 tablet (40 mg total) by mouth at bedtime.   carvedilol (COREG) 3.125 MG tablet Take 1 tablet (3.125 mg total) by mouth 2 (two) times daily with a meal.   losartan (COZAAR) 100 MG tablet Take 1 tablet (100 mg total) by mouth daily.   omeprazole (PRILOSEC) 40 MG capsule Take 40 mg by mouth daily.   oxyCODONE-acetaminophen (PERCOCET) 7.5-325 MG tablet Take 1 tablet by mouth every 8 (eight) hours as needed for severe pain.   YUPELRI 175 MCG/3ML nebulizer solution Take 175 mcg by nebulization daily.    Allergies:   Peach flavor, Vicodin [hydrocodone-acetaminophen], Vicodin [hydrocodone-acetaminophen], Hct [hydrochlorothiazide], and Tramadol   Social History   Socioeconomic History   Marital status: Divorced    Spouse name: Not on file   Number of children: Not on file   Years of education: Not on file   Highest education level: GED or equivalent  Occupational History   Occupation: unemployed  Tobacco Use   Smoking status: Every Day    Packs/day: 1.00    Years: 30.00    Additional pack years: 0.00    Total pack years:  30.00    Types: Cigarettes   Smokeless tobacco: Never  Vaping Use   Vaping Use: Never used  Substance and Sexual Activity   Alcohol use: No    Alcohol/week: 0.0 standard drinks of alcohol   Drug use: No   Sexual activity: Never  Other Topics Concern   Not on file  Social History Narrative   Food stamps for both her and friend she has been with for 23 years. Some months run low, but always make it.    Social Determinants of Health   Financial Resource Strain: Medium Risk (06/13/2018)   Overall Financial Resource Strain (CARDIA)    Difficulty of Paying Living Expenses: Somewhat hard  Food Insecurity: No Food Insecurity (12/11/2022)   Hunger Vital Sign    Worried About Running Out of Food in the Last Year: Never true    Ran Out of Food in the Last Year: Never true  Transportation Needs: No Transportation Needs (12/11/2022)   PRAPARE - Administrator, Civil Service (Medical): No  Lack of Transportation (Non-Medical): No  Physical Activity: Sufficiently Active (01/10/2018)   Exercise Vital Sign    Days of Exercise per Week: 6 days    Minutes of Exercise per Session: 40 min  Stress: Stress Concern Present (01/10/2018)   [employer] of Occupational Health - Occupational Stress Questionnaire    Feeling of Stress : To some extent  Social Connections: Moderately Integrated (01/10/2018)   Social Connection and Isolation Panel [NHANES]    Frequency of Communication with Friends and Family: More than three times a week    Frequency of Social Gatherings with Friends and Family: Three times a week    Attends Religious Services: 1 to 4 times per year    Active Member of Clubs or Organizations: No    Attends Banker Meetings: Never    Marital Status: Living with partner     Family History:  The patient's family history includes COPD in her father and mother; Heart failure in her father and mother.  ROS:   12-point review of systems is negative unless otherwise  noted in the HPI.   EKGs/Labs/Other Studies Reviewed:    Studies reviewed were summarized above. The additional studies were reviewed today:  LHC 11/29/2022: 1.  Normal coronary arteries. 2.  Left ventricular angiography was not performed.  EF was normal by echo. 3.  Severely elevated systolic blood pressure with mildly elevated left ventricular end-diastolic pressure.   Recommendations: Suspect that elevated troponin is due to type II myocardial infarction due to A-fib with RVR and severely elevated blood pressure. Heparin drip to be resumed at 6 PM.  This can be transition to Eliquis tomorrow. Continue blood pressure control.  I increased both carvedilol and losartan. __________  2D echo 11/28/2022: 1. Left ventricular ejection fraction, by estimation, is 55 to 60%. The  left ventricle has normal function. The left ventricle has no regional  wall motion abnormalities. There is mild left ventricular hypertrophy.  Left ventricular diastolic parameters  are consistent with Grade II diastolic dysfunction (pseudonormalization).   2. Right ventricular systolic function is normal. The right ventricular  size is normal.   3. The mitral valve is normal in structure. No evidence of mitral valve  regurgitation.   4. The aortic valve was not well visualized. Aortic valve regurgitation  is not visualized. Aortic valve sclerosis is present, with no evidence of  aortic valve stenosis.   5. The inferior vena cava is normal in size with greater than 50%  respiratory variability, suggesting right atrial pressure of 3 mmHg.    EKG:  EKG is ordered today.  The EKG ordered today demonstrates sinus bradycardia, 57 bpm, LVH with early repolarization abnormality and nonspecific ST-T changes  Recent Labs: 12/10/2022: B Natriuretic Peptide 75.0; TSH 2.391 12/11/2022: ALT 18; Magnesium 2.4 12/12/2022: BUN 12; Creatinine, Ser 0.90; Hemoglobin 14.9; Platelets 364; Potassium 3.8; Sodium 137  Recent Lipid  Panel    Component Value Date/Time   CHOL 193 11/27/2022 0423   CHOL 119 08/17/2018 1101   TRIG 188 (H) 11/27/2022 0423   HDL 39 (L) 11/27/2022 0423   HDL 41 08/17/2018 1101   CHOLHDL 4.9 11/27/2022 0423   VLDL 38 11/27/2022 0423   LDLCALC 116 (H) 11/27/2022 0423   LDLCALC 56 08/17/2018 1101    PHYSICAL EXAM:    VS:  BP (!) 185/93 (BP Location: Right Arm, Patient Position: Sitting, Cuff Size: Normal)   Pulse (!) 57   Ht 5\' 2"  (1.575 m)   Wt 161  lb 6.4 oz (73.2 kg)   LMP 08/09/2008 (Within Years)   SpO2 95%   BMI 29.52 kg/m   BMI: Body mass index is 29.52 kg/m.  Physical Exam Constitutional:      Appearance: She is well-developed.  HENT:     Head: Normocephalic and atraumatic.  Eyes:     General:        Right eye: No discharge.        Left eye: No discharge.  Neck:     Vascular: No JVD.  Cardiovascular:     Rate and Rhythm: Regular rhythm. Bradycardia present.     Heart sounds: Normal heart sounds, S1 normal and S2 normal. Heart sounds not distant. No midsystolic click and no opening snap. No murmur heard.    No friction rub.     Comments: Right radial arteriotomy site is well-healed without active bleeding, bruising, swelling, warmth, erythema, or tenderness to palpation.  Radial pulse 2+ proximal and distal to the arteriotomy site. Pulmonary:     Effort: Pulmonary effort is normal. No respiratory distress.     Breath sounds: Normal breath sounds. No decreased breath sounds, wheezing or rales.  Chest:     Chest wall: No tenderness.  Abdominal:     General: There is no distension.  Musculoskeletal:     Cervical back: Normal range of motion.     Right lower leg: No edema.     Left lower leg: No edema.  Skin:    General: Skin is warm and dry.     Nails: There is no clubbing.  Neurological:     Mental Status: She is alert and oriented to person, place, and time.  Psychiatric:        Speech: Speech normal.        Behavior: Behavior normal.        Thought  Content: Thought content normal.        Judgment: Judgment normal.     Wt Readings from Last 3 Encounters:  12/31/22 161 lb 6.4 oz (73.2 kg)  12/11/22 169 lb 1.5 oz (76.7 kg)  11/29/22 169 lb 1.5 oz (76.7 kg)     ASSESSMENT & PLAN:   PAF/atrial flutter/possible SVT/WCT/presyncope: Maintaining sinus rhythm with a mildly bradycardic rate.  Continue carvedilol 3.125 mg twice daily and amiodarone 200 mg daily.  Bradycardia precludes escalation of carvedilol.  Arrhythmia possibly triggered by hypokalemia.  Placed ZIO AT to evaluate for breakthrough arrhythmia and or/arrhythmia burden.  CHADS2VASc at least 4 (CHF, HTN, vascular disease, sex category).  She remains on apixaban 5 mg twice daily and does not meet reduced dosing criteria.  No falls or symptoms concerning for bleeding.  Check CBC, BMP, and magnesium.  Follow-up with EP as scheduled in 02/2023 further discussion of possible ablation.  HFpEF: Euvolemic and well compensated, not requiring standing loop diuretic.  HTN: Blood pressure is elevated in the office today with home readings largely in the 130s to 140s systolic.  However, blood pressure does appear to be somewhat labile.  Add amlodipine 5 mg daily with continuation of carvedilol 3.125 mg twice daily and losartan 100 mg daily.  HLD: LDL 116 in 11/2022.  Atorvastatin 40 mg.  Follow-up fasting lipid panel at next visit.  COPD with tobacco use: No acute exacerbation.  Continue to taper tobacco use to complete cessation.  Subclavian stenosis: Status post stenting.  She remains on aspirin and atorvastatin.     Disposition: F/u with Dr. Mariah Milling or an APP in 3 months.  Medication Adjustments/Labs and Tests Ordered: Current medicines are reviewed at length with the patient today.  Concerns regarding medicines are outlined above. Medication changes, Labs and Tests ordered today are summarized above and listed in the Patient Instructions accessible in Encounters.   Signed, Eula Listen,  PA-C 12/31/2022 10:59 AM     [facility] [place] [address] [place], [place] [address] [phone]

## 2022-12-31 NOTE — Patient Instructions (Signed)
Medication Instructions:  Your physician has recommended you make the following change in your medication:   START Amlodipine 5 mg once daily   *If you need a refill on your cardiac medications before your next appointment, please call your pharmacy*   Lab Work: BMET, MAG, CBC to be done today. Go to [facility] and check in at registration desk.   If you have labs (blood work) drawn today and your tests are completely normal, you will receive your results only by: MyChart Message (if you have MyChart) OR A paper copy in the mail If you have any lab test that is abnormal or we need to change your treatment, we will call you to review the results.   Testing/Procedures: Your physician has recommended that you wear a Zio monitor.   This monitor is a medical device that records the heart's electrical activity. Doctors most often use these monitors to diagnose arrhythmias. Arrhythmias are problems with the speed or rhythm of the heartbeat. The monitor is a small device applied to your chest. You can wear one while you do your normal daily activities. While wearing this monitor if you have any symptoms to push the button and record what you felt. Once you have worn this monitor for the period of time provider prescribed (Usually 14 days), you will return the monitor device in the postage paid box. Once it is returned they will download the data collected and provide [place] with a report which the provider will then review and we will call you with those results. Important tips:  Avoid showering during the first 24 hours of wearing the monitor. Avoid excessive sweating to help maximize wear time. Do not submerge the device, no hot tubs, and no swimming pools. Keep any lotions or oils away from the patch. After 24 hours you may shower with the patch on. Take brief showers with your back facing the shower head.  Do not remove patch once it has been placed because that will interrupt data and  decrease adhesive wear time. Push the button when you have any symptoms and write down what you were feeling. Once you have completed wearing your monitor, remove and place into box which has postage paid and place in your outgoing mailbox.  If for some reason you have misplaced your box then call our office and we can provide another box and/or mail it off for you.      Follow-Up: At [facility], you and your health needs are our priority.  As part of our continuing mission to provide you with exceptional heart care, we have created designated Provider Care Teams.  These Care Teams include your primary Cardiologist (physician) and Advanced Practice Providers (APPs -  Physician Assistants and Nurse Practitioners) who all work together to provide you with the care you need, when you need it.  We recommend signing up for the patient portal called "MyChart".  Sign up information is provided on this After Visit Summary.  MyChart is used to connect with patients for Virtual Visits (Telemedicine).  Patients are able to view lab/test results, encounter notes, upcoming appointments, etc.  Non-urgent messages can be sent to your provider as well.   To learn more about what you can do with MyChart, go to [email or web address].    Your next appointment:   3 month(s)  Provider:   Julien Nordmann, MD or Eula Listen, PA-C    Other Instructions KEEP Dr. Lalla Brothers appointment  02/16/23 at 10:40 am

## 2022-12-31 NOTE — Telephone Encounter (Signed)
Contacted patient to review lab results and recommendations. Discussed results, medication, and repeat labs. She states that she has upcoming appointment with her PCP so she will review that with her then. She requested that I send the medications to the [facility] pharmacy. Sent those in and she had no further questions

## 2023-01-05 DIAGNOSIS — R55 Syncope and collapse: Secondary | ICD-10-CM | POA: Diagnosis not present

## 2023-01-05 DIAGNOSIS — R000 Tachycardia, unspecified: Secondary | ICD-10-CM

## 2023-01-13 LAB — COLOGUARD: Cologuard: POSITIVE — AB

## 2023-01-14 ENCOUNTER — Other Ambulatory Visit: Payer: Self-pay

## 2023-01-19 ENCOUNTER — Telehealth: Payer: Self-pay

## 2023-01-19 ENCOUNTER — Telehealth: Payer: Self-pay | Admitting: *Deleted

## 2023-01-19 NOTE — Telephone Encounter (Signed)
-----   Message from Levi Aland, NP sent at 01/19/2023 12:00 PM EDT ----- Regarding: FW: Colonoscopy Scheduling Pending-TBD After Clearance Granted  ----- Message ----- From: Avie Arenas, CMA Sent: 01/19/2023  11:51 AM EDT To: Cv Div Preop Subject: Colonoscopy Scheduling Pending-TBD After Cle#     [facility] Pre-operative Risk Assessment   Request for surgical clearance: YES  What type of surgery is being performed? Colonoscopy   When is this surgery scheduled? Date will be scheduled after clearance has been granted.   Are there any medications that need to be held prior to surgery and how long?YES Eliqiuis   Practice name and name of physician performing surgery? [facility]   What is your office phone and fax number? [phone]   Anesthesia type (None, local, MAC, general) ? General   Avie Arenas 01/19/2023, 11:48 AM  _________________________________________________________________   (provider comments below)

## 2023-01-19 NOTE — Telephone Encounter (Signed)
Patient was overall doing well at her hospital follow-up, though was having some dizziness with presyncope, leading to placement of Zio.  It would be best to evaluate her Zio patch, which is pending, prior to providing further risk stratification to ensure there are no significant arrhythmias.  If this is reassuring, she should be able to proceed with noncardiac procedure at an overall low risk, as long as she has not developed any new or concerning symptoms.

## 2023-01-19 NOTE — Telephone Encounter (Signed)
   Pre-operative Risk Assessment    Patient Name: Sheila [name]  DOB: [birth year] MRN: [id number]      Request for Surgical Clearance    Procedure:   COLONOSCOPY  Date of Surgery:  Clearance TBD                                 Surgeon: NOT LISTED  Surgeon's Group or Practice Name: [facility] Phone number:  [phone] Fax number:  [phone]   Type of Clearance Requested:   - Medical  - Pharmacy:  Hold Apixaban (Eliquis)     Type of Anesthesia:  General    Additional requests/questions:    Elpidio Anis   01/19/2023, 12:05 PM

## 2023-01-19 NOTE — Telephone Encounter (Signed)
Patient with diagnosis of afib on Eliquis for anticoagulation.    Procedure: colonoscopy Date of procedure: TBD  CHA2DS2-VASc Score = 4  This indicates a 4.8% annual risk of stroke. The patient's score is based upon: CHF History: 1 HTN History: 1 Diabetes History: 0 Stroke History: 0 Vascular Disease History: 1 Age Score: 0 Gender Score: 1   Underwent DCCV 12/10/22. Seeing EP on 6/27 to discuss possible ablation.   CrCl 44mL/min using adjusted body weight Platelet count 436K  Per office protocol, patient can hold Eliquis for 1-2 days prior to procedure, however if she is scheduled for ablation then there will be a period of time where she cannot interrupt her anticoag (3 weeks prior to and 3 months post ablation).  **This guidance is not considered finalized until pre-operative APP has relayed final recommendations.**

## 2023-01-19 NOTE — Telephone Encounter (Signed)
Gastroenterology Pre-Procedure Review  Request Date: TBD after clearance has been granted Requesting Physician: Dr. Jodelle Gross  PATIENT REVIEW QUESTIONS: The patient responded to the following health history questions as indicated:    1. Are you having any GI issues? no however patient has positive cologuard.  This is her 1st colonoscopy. 2. Do you have a personal history of Polyps? no 3. Do you have a family history of Colon Cancer or Polyps? no 4. Diabetes Mellitus? no 5. Joint replacements in the past 12 months?no 6. Major health problems in the past 3 months?no 7. Any artificial heart valves, MVP, or defibrillator? 8. Cardiac history? Afib cardiac clearance sent to [facility] CV Div Preop Team    MEDICATIONS & ALLERGIES:    Patient reports the following regarding taking any anticoagulation/antiplatelet therapy:   Plavix, Coumadin, Eliquis, Xarelto, Lovenox, Pradaxa, Brilinta, or Effient? Yes pt takes Eliquis Aspirin? Yes 81mg  daily  Patient confirms/reports the following medications:  Current Outpatient Medications  Medication Sig Dispense Refill   acetaminophen (TYLENOL) 500 MG tablet Take 500-1,000 mg by mouth every 6 (six) hours as needed for mild pain or moderate pain.     albuterol (PROVENTIL) (2.5 MG/3ML) 0.083% nebulizer solution Take 2.5 mg by nebulization 3 (three) times daily.     ALPRAZolam (XANAX) 0.5 MG tablet Take 0.5 mg by mouth 4 (four) times daily as needed for anxiety.     amiodarone (PACERONE) 200 MG tablet Table 2 tablets twice a day for 5 days, then 2 tablets daily for 5 days, then 1 tablet daily thereafter 60 tablet 2   amLODipine (NORVASC) 5 MG tablet Take 1 tablet (5 mg total) by mouth daily. 90 tablet 3   apixaban (ELIQUIS) 5 MG TABS tablet Take 1 tablet (5 mg total) by mouth 2 (two) times daily. 60 tablet 3   aspirin EC 81 MG tablet Take 1 tablet (81 mg total) by mouth daily. Swallow whole. 30 tablet 12   atorvastatin (LIPITOR) 40 MG tablet Take 1 tablet (40 mg total)  by mouth at bedtime. 30 tablet 1   carvedilol (COREG) 3.125 MG tablet Take 1 tablet (3.125 mg total) by mouth 2 (two) times daily with a meal. 60 tablet 2   losartan (COZAAR) 100 MG tablet Take 1 tablet (100 mg total) by mouth daily. 30 tablet 1   omeprazole (PRILOSEC) 40 MG capsule Take 40 mg by mouth daily.     oxyCODONE-acetaminophen (PERCOCET) 7.5-325 MG tablet Take 1 tablet by mouth every 8 (eight) hours as needed for severe pain.     potassium chloride (KLOR-CON) 10 MEQ tablet Take 1 tablet (10 mEq total) by mouth daily. 90 tablet 3   YUPELRI 175 MCG/3ML nebulizer solution Take 175 mcg by nebulization daily.     No current facility-administered medications for this visit.    Patient confirms/reports the following allergies:  Allergies  Allergen Reactions   Peach Flavor Shortness Of Breath   Vicodin [Hydrocodone-Acetaminophen] Nausea And Vomiting   Vicodin [Hydrocodone-Acetaminophen]     Abd pain, N/V   Hct [Hydrochlorothiazide] Palpitations   Tramadol Palpitations    No orders of the defined types were placed in this encounter.   AUTHORIZATION INFORMATION Primary Insurance: 1D#: Group #:  Secondary Insurance: 1D#: Group #:  SCHEDULE INFORMATION: Date: TBD  Time: Location: [facility]

## 2023-01-19 NOTE — Telephone Encounter (Signed)
Sheila [name], you saw this patient on 12/31/22 who now seeks cardiac risk assessment for colonoscopy.  Could you please comment on cardiac risk for upcoming procedure and send your response to p cv div preop?  Thank you, Marcelino Duster

## 2023-01-24 ENCOUNTER — Ambulatory Visit: Payer: Medicaid Other | Admitting: Internal Medicine

## 2023-01-24 ENCOUNTER — Encounter: Payer: Self-pay | Admitting: Internal Medicine

## 2023-01-24 VITALS — BP 128/80 | HR 69 | Temp 96.6°F | Ht 62.0 in | Wt 160.8 lb

## 2023-01-24 DIAGNOSIS — F17210 Nicotine dependence, cigarettes, uncomplicated: Secondary | ICD-10-CM | POA: Diagnosis not present

## 2023-01-24 DIAGNOSIS — I498 Other specified cardiac arrhythmias: Secondary | ICD-10-CM

## 2023-01-24 DIAGNOSIS — R0602 Shortness of breath: Secondary | ICD-10-CM | POA: Diagnosis not present

## 2023-01-24 DIAGNOSIS — G4719 Other hypersomnia: Secondary | ICD-10-CM

## 2023-01-24 DIAGNOSIS — J441 Chronic obstructive pulmonary disease with (acute) exacerbation: Secondary | ICD-10-CM | POA: Diagnosis not present

## 2023-01-24 MED ORDER — BREZTRI AEROSPHERE 160-9-4.8 MCG/ACT IN AERO
2.0000 | INHALATION_SPRAY | Freq: Two times a day (BID) | RESPIRATORY_TRACT | 8 refills | Status: DC
Start: 2023-01-24 — End: 2023-01-31

## 2023-01-24 MED ORDER — PREDNISONE 20 MG PO TABS
20.0000 mg | ORAL_TABLET | Freq: Every day | ORAL | 1 refills | Status: DC
Start: 2023-01-24 — End: 2023-08-11

## 2023-01-24 MED ORDER — BREZTRI AEROSPHERE 160-9-4.8 MCG/ACT IN AERO: 2.0000 | INHALATION_SPRAY | Freq: Two times a day (BID) | RESPIRATORY_TRACT | 0 refills | Status: DC

## 2023-01-24 MED ORDER — LEVOFLOXACIN 500 MG PO TABS
500.0000 mg | ORAL_TABLET | Freq: Every day | ORAL | 0 refills | Status: AC
Start: 2023-01-24 — End: 2023-02-03

## 2023-01-24 NOTE — Progress Notes (Signed)
[facility] [place] [facility] Consultation      Date: 01/24/2023,   MRN# [id number] DARON FERNEAU 1960-10-08     CHIEF COMPLAINT:  Assessment of SOB ASSESSMENT OF COPD   HISTORY OF PRESENT ILLNESS   62 y.o. female with history of coronary arteries by LHC in 11/2022, PAF, atrial flutter, possible SVT, WCT, HFpEF, PAD and subclavian stenosis status post stenting in 2019, HTN, HLD, and COPD with tobacco use   admitted to [facility] in 11/2022 with A-fib with RVR and NSTEMI.  High-sensitivity troponin trended up to 1400.  She converted to sinus rhythm with IV Cardizem.   Echo showed an EF of 55 to 60%, no regional wall motion abnormalities, mild LVH, grade 2 diastolic dysfunction, normal RV systolic function and ventricular cavity size, and aortic valve sclerosis without evidence of stenosis.   LHC during the admission showed normal coronary arteries with mildly elevated LVEDP.  She was discharged on apixaban and carvedilol.  She presented to [facility] on 12/10/2022 with sudden onset of dizziness, diaphoresis, substernal chest pain, and palpitations.  Upon arrival she was tachycardic rates in the 230s bpm.  Initial rhythm showed WCT which appeared different from her prior A-fib.  Systolic blood pressure was 83 mmHg.  She underwent emergent DCCV with subsequent conversion to rapid A-fib and SVT, for which she received 300 mg of IV amiodarone push, and was started on amiodarone drip with maintenance of sinus rhythm.  It was also noted after cardioversion versus amiodarone bolus that she had junctional bradycardia with rates in the 30s bpm as well.  She was subsequently transferred to [facility] where she remained in sinus rhythm and was evaluated by EP with initial rhythm felt to be atrial flutter versus SVT with aberrancy.  EP recommended continuing amiodarone and apixaban with favoring of EP study and catheter ablation of A-fib, flutter, and possible SVT in the outpatient setting.   Patient presents today in  our clinic for increased work of breathing shortness of breath coughing and wheezing Patient does have a productive cough signs and symptoms of COPD exacerbation Patient was told she was diagnosed with COPD several years ago She currently uses tobacco 1 pack a day for the last 30 years  Patient has an abnormal CT chest several years ago in 2019 with a left lower lobe lung nodule Patient is a high risk for lung cancer recommend lung cancer screening program   Recommend Sleep Study Patient has excessive daytime sleepiness tired snoring and witnessed apneas Epworth sleep score is 12    Encouraged proper weight management.  Important to get eight or more hours of sleep  Limiting the use of the computer and television before bedtime.  Decrease naps during the day, so night time sleep will become enhanced.  Limit caffeine, and sleep deprivation.  HTN, stroke, uncontrolled diabetes and heart failure are potential risk factors.  Risk of untreated sleep apnea including cardiac arrhthymias, stroke, DM, pulm HTN.     Smoking Assessment and Cessation Counseling Upon further questioning, Patient smokes 1 ppd I have advised patient to quit/stop smoking as soon as possible due to high risk for multiple medical problems  Patient is willing to quit smoking I have advised patient that we can assist and have options of Nicotine replacement therapy. I also advised patient on behavioral therapy and can provide oral medication therapy in conjunction with the other therapies Follow up next Office visit  for assessment of smoking cessation Smoking cessation counseling advised for 4 minutes  PAST MEDICAL HISTORY   Past Medical History:  Diagnosis Date   Aorto-iliac atherosclerosis (HCC) 01/06/2016   Asthma    COPD (chronic obstructive pulmonary disease) (HCC)    Hypertension    Osteoporosis    Tobacco abuse 01/06/2016   Vitamin D deficiency 01/06/2016     SURGICAL HISTORY   Past Surgical  History:  Procedure Laterality Date   ANKLE FRACTURE SURGERY Left 11/07/2009   left ankle hardware removal     left ankle surgery     LEFT HEART CATH AND CORONARY ANGIOGRAPHY N/A 11/29/2022   Procedure: LEFT HEART CATH AND CORONARY ANGIOGRAPHY;  Surgeon: Iran Ouch, MD;  Location: [facility] INVASIVE CV LAB;  Service: Cardiovascular;  Laterality: N/A;   LOWER EXTREMITY ANGIOGRAPHY Left 10/17/2017   Procedure: LOWER EXTREMITY ANGIOGRAPHY;  Surgeon: Annice Needy, MD;  Location: [facility] INVASIVE CV LAB;  Service: Cardiovascular;  Laterality: Left;   TUBAL LIGATION       FAMILY HISTORY   Family History  Problem Relation Age of Onset   COPD Mother    Heart failure Mother    COPD Father    Heart failure Father      SOCIAL HISTORY   Social History   Tobacco Use   Smoking status: Every Day    Packs/day: 1.00    Years: 30.00    Additional pack years: 0.00    Total pack years: 30.00    Types: Cigarettes   Smokeless tobacco: Never  Vaping Use   Vaping Use: Never used  Substance Use Topics   Alcohol use: No    Alcohol/week: 0.0 standard drinks of alcohol   Drug use: No     MEDICATIONS    Home Medication:  Current Outpatient Rx   Order #: [id number] Class: Historical Med   Order #: [id number] Class: Historical Med   Order #: [id number] Class: Historical Med   Order #: [id number] Class: Normal   Order #: [id number] Class: Normal   Order #: [id number] Class: Normal   Order #: [id number] Class: Normal   Order #: [id number] Class: Normal   Order #: [id number] Class: Normal   Order #: [id number] Class: Normal   Order #: [id number] Class: Historical Med   Order #: [id number] Class: Historical Med   Order #: [id number] Class: Normal   Order #: [id number] Class: Historical Med    Current Medication:  Current Outpatient Medications:    acetaminophen (TYLENOL) 500 MG tablet, Take 500-1,000 mg by mouth every 6 (six) hours as needed for mild pain or moderate pain., Disp: , Rfl:    albuterol (PROVENTIL)  (2.5 MG/3ML) 0.083% nebulizer solution, Take 2.5 mg by nebulization 3 (three) times daily., Disp: , Rfl:    ALPRAZolam (XANAX) 0.5 MG tablet, Take 0.5 mg by mouth 4 (four) times daily as needed for anxiety., Disp: , Rfl:    amiodarone (PACERONE) 200 MG tablet, Table 2 tablets twice a day for 5 days, then 2 tablets daily for 5 days, then 1 tablet daily thereafter, Disp: 60 tablet, Rfl: 2   amLODipine (NORVASC) 5 MG tablet, Take 1 tablet (5 mg total) by mouth daily., Disp: 90 tablet, Rfl: 3   apixaban (ELIQUIS) 5 MG TABS tablet, Take 1 tablet (5 mg total) by mouth 2 (two) times daily., Disp: 60 tablet, Rfl: 3   aspirin EC 81 MG tablet, Take 1 tablet (81 mg total) by mouth daily. Swallow whole., Disp: 30 tablet, Rfl: 12   atorvastatin (LIPITOR) 40 MG tablet, Take 1 tablet (40 mg total) by mouth at bedtime., Disp: 30 tablet, Rfl: 1  carvedilol (COREG) 3.125 MG tablet, Take 1 tablet (3.125 mg total) by mouth 2 (two) times daily with a meal., Disp: 60 tablet, Rfl: 2   losartan (COZAAR) 100 MG tablet, Take 1 tablet (100 mg total) by mouth daily., Disp: 30 tablet, Rfl: 1   omeprazole (PRILOSEC) 40 MG capsule, Take 40 mg by mouth daily., Disp: , Rfl:    oxyCODONE-acetaminophen (PERCOCET) 7.5-325 MG tablet, Take 1 tablet by mouth every 8 (eight) hours as needed for severe pain., Disp: , Rfl:    potassium chloride (KLOR-CON) 10 MEQ tablet, Take 1 tablet (10 mEq total) by mouth daily., Disp: 90 tablet, Rfl: 3   YUPELRI 175 MCG/3ML nebulizer solution, Take 175 mcg by nebulization daily., Disp: , Rfl:     ALLERGIES   Peach flavor, Vicodin [hydrocodone-acetaminophen], Vicodin [hydrocodone-acetaminophen], Hct [hydrochlorothiazide], and Tramadol     REVIEW OF SYSTEMS    Review of Systems:  Gen:  Denies  fever, sweats, chills weigh loss  HEENT: Denies blurred vision, double vision, ear pain, eye pain, hearing loss, nose bleeds, sore throat Cardiac:  No dizziness, chest pain or heaviness, chest  tightness,edema Resp:   +sputum porduction, +shortness of breath,+wheezing, hemoptysis,  Gi: Denies swallowing difficulty, stomach pain, nausea or vomiting, diarrhea, constipation, bowel incontinence Gu:  Denies bladder incontinence, burning urine Ext:   Denies Joint pain, stiffness or swelling Skin: Denies  skin rash, easy bruising or bleeding or hives Endoc:  Denies polyuria, polydipsia , polyphagia or weight change Psych:   Denies depression, insomnia or hallucinations   Other:  All other systems negative BP 128/80 (BP Location: Right Arm, Cuff Size: Normal)   Pulse 69   Temp (!) 96.6 F (35.9 C)   Ht 5\' 2"  (1.575 m)   Wt 160 lb 12.8 oz (72.9 kg)   LMP 08/09/2008 (Within Years)   SpO2 93%   BMI 29.41 kg/m      PHYSICAL EXAM  General Appearance: No distress  EYES PERRLA, EOM intact.   NECK Supple, No JVD Pulmonary: normal breath sounds, No wheezing.  CardiovascularNormal S1,S2.  No m/r/g.   Abdomen: Benign, Soft, non-tender. Skin:   warm, no rashes, no ecchymosis  Extremities: normal, no cyanosis, clubbing. Neuro:without focal findings,  speech normal  PSYCHIATRIC: Mood, affect within normal limits.   ALL OTHER ROS ARE NEGATIVE      IMAGING    CT chest independently reviewed today from 2019 Bilateral groundglass opacifications mosaic pattern with areas of emphysema with a left lower lobe lung nodule  LEFT HEART CATH APRIL 2024 1.  Normal coronary arteries. 2.  Left ventricular angiography was not performed.  EF was normal by echo. 3.  Severely elevated systolic blood pressure with mildly elevated left ventricular end-diastolic pressure.     ASSESSMENT/PLAN   62 year old pleasant white female seen today for ongoing issues with arrhythmias with several hospital admissions including non-STEMI with ongoing tobacco abuse with COPD and COPD exacerbation at this time with chronic cough and sputum production most likely related to chronic bronchitis In the setting  of obesity and deconditioned state with underlying signs symptoms of excessive daytime sleepiness snoring and witnessed apneas consistent with sleep apnea   COPD Recommend pulm obtaining pulmonary function test to assess lung function Recommend starting Breztri 2 puffs in a.m. and p.m. rinsing mouth after every use Continue albuterol as needed  COPD exacerbation Start prednisone 20 mg daily for 10 days Start Levaquin  500 mg for 10 days    Tobacco abuse Smoking cessation strongly advised  Recommend referral to lung cancer screening program CT of the chest reviewed with patient left lower lobe lung nodule persistent Areas of emphysema and mosaic pattern    Evidence of daytime sleepiness nonrefreshed sleep tiredness snoring and witnessed apneas highly consistent of underlying sleep apnea Recommend home sleep study          MEDICATION ADJUSTMENTS/LABS AND TESTS ORDERED: Regarding his COPD We will start Breztri 2 puffs in the morning and 2 puffs at night Recommend rinsing your mouth out after every use Start prednisone 20 mg daily for 10 days Start Levaquin 500 mg daily for 10 days Please stop smoking Recommend lung cancer screening program Recommend home sleep study to assess for sleep apnea Avoid secondhand smoke Avoid SICK contacts Recommend  Masking  when appropriate Recommend Keep up-to-date with vaccinations   CURRENT MEDICATIONS REVIEWED AT LENGTH WITH PATIENT TODAY   Patient  satisfied with Plan of action and management. All questions answered  Follow up 3 months  Total Time Spent 65 minutes   Lucie Leather, M.D.  [facility]  Medical Director [facility] Medical Director [facility] Cardio-Pulmonary Department

## 2023-01-24 NOTE — Patient Instructions (Signed)
Regarding his COPD We will start Breztri 2 puffs in the morning and 2 puffs at night Recommend rinsing your mouth out after every use  Start prednisone 20 mg daily for 10 days Start Levaquin 500 mg daily for 10 days   Please stop smoking   Recommend lung cancer screening program  Recommend home sleep study to assess for sleep apnea  Avoid secondhand smoke Avoid SICK contacts Recommend  Masking  when appropriate Recommend Keep up-to-date with vaccinations

## 2023-01-25 ENCOUNTER — Telehealth: Payer: Self-pay

## 2023-01-25 NOTE — Telephone Encounter (Signed)
Dr. Belia Heman, please see below message and advise on alternative.

## 2023-01-25 NOTE — Telephone Encounter (Signed)
*  Pulm  PA request received for Breztri Aerosphere 160-9-4.8MCG/ACT aerosol  PA submitted to [employer] [place] [employer] and is pending additional questions/determination  Key: [id number]

## 2023-01-25 NOTE — Telephone Encounter (Signed)
[facility] reviewed your BREZTRI AEROSPHERE INHALER request for the above-identified member, and it is denied for the following reason: because we did not see certain details about your use and treatment. We see that this request is for a drug called Dipl[employer] operational officer for your use (chronic obstructive pulmonary disease with (acute) exacerbation). We may consider approval of this drug after a trial of certain other drugs first (a trial and failure of two formulary preferred drugs, such as Advair Diskus, Advair HFA, Dulera,Symbicort). We did not see records that you tried and did not respond well to two of these drugs first or that you  cannot use them for certain reasons (such as a drug-drug interaction or adverse drug experience). We based this decision on your health plan's prior authorization criteria named  Preferred Drug List.

## 2023-01-27 NOTE — Telephone Encounter (Signed)
Preoperative team, patient has upcoming appointment with Dr. Lalla Brothers on 02/03/2023.  We are awaiting results/finalization of cardiac event monitor.  I will defer preoperative cardiac evaluation and recommendations to appointment at that time.  We will move patient from preoperative pool.  Please add preoperative cardiac evaluation to appointment note.  Thank you for your help.  Thomasene Ripple. Kaila Devries NP-C     01/27/2023, 9:34 AM [facility] [address] Office [phone] Fax [phone]

## 2023-01-27 NOTE — Telephone Encounter (Signed)
ADDENDUM TO DOS: TBD NOT 02/03/23   I will update the requesting office to see the notes from Edd Fabian, FNP. Pt has appt 02/03/23 with Dr. Lalla Brothers. Per pre op APP will defer clearance to Dr. Lalla Brothers at time of appt.

## 2023-01-31 MED ORDER — FLUTICASONE-SALMETEROL 500-50 MCG/ACT IN AEPB: 1.0000 | INHALATION_SPRAY | Freq: Two times a day (BID) | RESPIRATORY_TRACT | 6 refills | Status: DC

## 2023-01-31 NOTE — Telephone Encounter (Signed)
Patient is aware of below message/recommendations and voiced her understanding.  Advair sent to preferred pharmacy. Nothing further needed.  

## 2023-01-31 NOTE — Addendum Note (Signed)
Addended by: Lajoyce Lauber A on: 01/31/2023 09:24 AM   Modules accepted: Orders

## 2023-02-03 ENCOUNTER — Encounter: Payer: Self-pay | Admitting: Cardiology

## 2023-02-03 ENCOUNTER — Ambulatory Visit: Payer: Medicaid Other

## 2023-02-03 ENCOUNTER — Ambulatory Visit: Payer: Medicaid Other | Attending: Cardiology | Admitting: Cardiology

## 2023-02-03 ENCOUNTER — Other Ambulatory Visit
Admission: RE | Admit: 2023-02-03 | Discharge: 2023-02-03 | Disposition: A | Discharge status: Home / Self Care | Payer: Medicaid Other | Attending: Cardiology | Admitting: Cardiology

## 2023-02-03 VITALS — BP 138/80 | HR 59 | Ht 62.0 in | Wt 162.0 lb

## 2023-02-03 DIAGNOSIS — G4719 Other hypersomnia: Secondary | ICD-10-CM

## 2023-02-03 DIAGNOSIS — I10 Essential (primary) hypertension: Secondary | ICD-10-CM | POA: Insufficient documentation

## 2023-02-03 DIAGNOSIS — R000 Tachycardia, unspecified: Secondary | ICD-10-CM

## 2023-02-03 DIAGNOSIS — G4733 Obstructive sleep apnea (adult) (pediatric): Secondary | ICD-10-CM | POA: Diagnosis not present

## 2023-02-03 DIAGNOSIS — I4892 Unspecified atrial flutter: Secondary | ICD-10-CM

## 2023-02-03 DIAGNOSIS — I480 Paroxysmal atrial fibrillation: Secondary | ICD-10-CM

## 2023-02-03 LAB — COMPREHENSIVE METABOLIC PANEL
ALT: 16 U/L (ref 0–44)
AST: 14 U/L — ABNORMAL LOW (ref 15–41)
Albumin: 3.4 g/dL — ABNORMAL LOW (ref 3.5–5.0)
Alkaline Phosphatase: 68 U/L (ref 38–126)
Anion gap: 10 (ref 5–15)
BUN: 13 mg/dL (ref 8–23)
CO2: 23 mmol/L (ref 22–32)
Calcium: 9.1 mg/dL (ref 8.9–10.3)
Chloride: 106 mmol/L (ref 98–111)
Creatinine, Ser: 0.93 mg/dL (ref 0.44–1.00)
GFR, Estimated: >60 mL/min (ref >60)
Glucose, Bld: 93 mg/dL (ref 70–99)
Potassium: 3.2 mmol/L — ABNORMAL LOW (ref 3.5–5.1)
Sodium: 139 mmol/L (ref 135–145)
Total Bilirubin: 0.4 mg/dL (ref 0.3–1.2)
Total Protein: 6.2 g/dL — ABNORMAL LOW (ref 6.5–8.1)

## 2023-02-03 LAB — TSH: TSH: 5.475 u[IU]/mL — ABNORMAL HIGH (ref 0.350–4.500)

## 2023-02-03 LAB — T4, FREE: Free T4: 0.98 ng/dL (ref 0.61–1.12)

## 2023-02-03 MED ORDER — CARVEDILOL 6.25 MG PO TABS: 6.2500 mg | ORAL_TABLET | Freq: Two times a day (BID) | ORAL | 3 refills | Status: DC

## 2023-02-03 NOTE — Progress Notes (Signed)
Electrophysiology Office Follow up Visit Note:    Date:  02/03/2023   ID:  Sheila [name], DOB 1960-10-12, MRN [id number]  PCP:  Alease Medina, MD  [facility] Cardiologist:  Julien Nordmann, MD  [facility] Electrophysiologist:  Lanier Prude, MD    Interval History:    Sheila [name] is a 62 y.o. female who presents for a follow up visit.   The patient was last seen by Eula Listen on Dec 31, 2022.  The patient has a medical history that includes atrial fibrillation flutter, SVT, hypertension, hyperlipidemia and COPD.  She was admitted to [facility] regional in April 2024 with atrial fibrillation with rapid ventricular rates.  She had an NSTEMI in the setting of those rapid rates.  She was again admitted in early May 2024 with dizziness, diaphoresis, chest pain and palpitations.  EKG on arrival showed a heart rate in the 230s with a wide QRS complex.  Cardioversion was performed which transition the rhythm to atrial fibrillation.  She was started on amiodarone drip after cardioversion and amiodarone she became bradycardic with a junctional rhythm in the 30s.  She was started on Eliquis for stroke prophylaxis.  I met the patient Dec 11, 2022.  At my initial visit with the patient in the hospital I recommended amiodarone as a bridge to catheter ablation of atrial fibrillation and flutter.  Today she tells me her breathing is bad.  She recently had a change in her inhaler regimen because of excessive cost of one of the newer inhalers.  Her respiratory status has worsened since that time.  She continues to feel intermittent palpitations although these are short-lived.  No syncope or presyncope.  Has been taking her medications including her Eliquis.       Past medical, surgical, social and family history were reviewed.  ROS:   Please see the history of present illness.    All other systems reviewed and are negative.  EKGs/Labs/Other Studies Reviewed:    The following studies  were reviewed today:  Dec 10, 2022 EKG    Dec 31, 2022 EKG shows sinus rhythm          Physical Exam:    VS:  BP 138/80   Pulse (!) 59   Ht 5\' 2"  (1.575 m)   Wt 162 lb (73.5 kg)   LMP 08/09/2008 (Within Years)   SpO2 94%   BMI 29.63 kg/m     Wt Readings from Last 3 Encounters:  02/03/23 162 lb (73.5 kg)  01/24/23 160 lb 12.8 oz (72.9 kg)  12/31/22 161 lb 6.4 oz (73.2 kg)     GEN: [place] ill-appearing, obese.  Audible wheezing from across the room. CARDIAC: RRR, no murmurs, rubs, gallops RESPIRATORY: Scattered wheezing throughout bilateral lung fields.  Prolonged expiratory phase.  Mild increased work of breathing.      ASSESSMENT:    1. Atrial flutter, unspecified type (HCC)   2. PAF (paroxysmal atrial fibrillation) (HCC)   3. Wide-complex tachycardia   4. Essential hypertension    PLAN:    In order of problems listed above:   #Atrial fibrillation and flutter Symptomatic.  Associated with rapid ventricular rates leading to NSTEMI.  Rhythm control indicated.  She takes amiodarone currently but this is not a good long-term solution given her lung disease and young age.  We discussed catheter ablation as a more durable rhythm control strategy that would allow [place] to avoid amiodarone use.  She is currently not a candidate for  catheter ablation given her pulmonary status.  They recently had to change her inhaler regimen and this is caused a worsening of her respiratory status.  I am concerned given the severity of her respiratory symptoms that she may not regain candidacy for catheter ablation.  Today we will increase her Coreg to 6.25 mg by mouth twice daily  I will have her follow-up in about 3 months with EP APP to reassess her pulmonary status.  If her pulmonary status improves, can revisit her candidacy for catheter ablation.  #Preoperative risk stratification At this time given her pulmonary and cardiac status, I think she is a poor candidate for invasive  procedures including colonoscopy under anesthesia.  I would recommend delaying routine colonoscopy until her respiratory status has improved.  #High risk med monitoring-amiodarone Repeat CMP, TSH and free T4 today.  #Hypertension At goal today.  Recommend checking blood pressures 1-2 times per week at home and recording the values.  Recommend bringing these recordings to the primary care physician.    Follow-up 3 months with APP.   Signed, Steffanie Dunn, MD, [facility], [facility] 02/03/2023 9:53 PM    Electrophysiology [facility]

## 2023-02-03 NOTE — Patient Instructions (Addendum)
Medication Instructions:  Your physician has recommended you make the following change in your medication:  1) INCREASE Coreg (carvedilol) to 6.25 mg twice daily   *If you need a refill on your cardiac medications before your next appointment, please call your pharmacy*  Lab Work: TODAY: CMET, TSH, T4 You will get your lab work at [facility]) hospital.  Your lab work will be done at the [facility] mall next to [employer].  These are walk in labs- you will not need an appointment and you do not need to be fasting.    Follow-Up: At [facility], you and your health needs are our priority.  As part of our continuing mission to provide you with exceptional heart care, we have created designated Provider Care Teams.  These Care Teams include your primary Cardiologist (physician) and Advanced Practice Providers (APPs -  Physician Assistants and Nurse Practitioners) who all work together to provide you with the care you need, when you need it.  Your next appointment:   3 month(s)  Provider:   Sherie Don, NP

## 2023-02-04 ENCOUNTER — Telehealth: Payer: Self-pay | Admitting: *Deleted

## 2023-02-04 ENCOUNTER — Other Ambulatory Visit: Payer: Self-pay

## 2023-02-04 ENCOUNTER — Telehealth: Payer: Self-pay

## 2023-02-04 DIAGNOSIS — E876 Hypokalemia: Secondary | ICD-10-CM

## 2023-02-04 NOTE — Telephone Encounter (Signed)
Per Heart Care chart note 01/27/23 patients preoperative cardiac clearance evaluation was deferred to her appt time with Dr. Lalla Brothers on 02/03/23.  Follow up clearance with blood thinner advice has been faxed to receive an update.   Thanks,  [name], [place]

## 2023-02-04 NOTE — Telephone Encounter (Signed)
REQUESTING OFFICE SENT DUPLICATE REQUEST.   I have reviewed notes from Dr. Lalla Brothers: #Preoperative risk stratification At this time given her pulmonary and cardiac status, I think she is a poor candidate for invasive procedures including colonoscopy under anesthesia.  I would recommend delaying routine colonoscopy until her respiratory status has improved.  I will fax notes to requesting office to see Dr. Lovena Neighbours recommendation.

## 2023-02-07 ENCOUNTER — Telehealth: Payer: Self-pay

## 2023-02-07 DIAGNOSIS — G4733 Obstructive sleep apnea (adult) (pediatric): Secondary | ICD-10-CM

## 2023-02-07 MED ORDER — POTASSIUM CHLORIDE CRYS ER 20 MEQ PO TBCR: 20.0000 meq | EXTENDED_RELEASE_TABLET | Freq: Two times a day (BID) | ORAL | 3 refills | Status: DC

## 2023-02-07 NOTE — Telephone Encounter (Signed)
The patient has been notified of the result and verbalized understanding.  All questions (if any) were answered. Frutoso Schatz, RN 02/07/2023 1:28 PM

## 2023-02-07 NOTE — Telephone Encounter (Signed)
-----   Message from Lanier Prude, MD sent at 02/06/2023  9:58 PM EDT ----- Recommend increasing Kdur to daily. Recommend follow up with PCP for further monitoring of her potassium level.  Sheria Lang T. Lalla Brothers, MD, [facility], [facility] Cardiac Electrophysiology

## 2023-02-07 NOTE — Telephone Encounter (Signed)
Per Heart Care Preop Recommendations from Dr. Lalla Brothers received on 02/04/23, " At this time given her pulmonary and cardiac status, I think she is a poor candidate for invasive procedures including colonoscopy under anesthesia.  I would recommend delaying colonoscopy until her respiratory status has improved".  Voice message has been left for patient to call me back to let her know that we are unable to move forward with scheduling her colonoscopy.  Contacted her PCP office to make them aware.  Thanks,  [place], [place]

## 2023-02-14 ENCOUNTER — Telehealth: Payer: Self-pay

## 2023-02-14 NOTE — Telephone Encounter (Signed)
Spoke to patient and relayed below results. She would like to hold off on cpap and discuss further at upcoming appt.  Nothing further needed.  Routing to Dr. Belia Heman as an Lorain Childes.

## 2023-02-14 NOTE — Telephone Encounter (Signed)
-----   Message from Erin Fulling, MD sent at 02/14/2023  8:05 AM EDT ----- HST shows DIAGNOSIS OF  OBSTRUCTIVE SLEEP APNEA  OPTIONS I recommend a trial of auto CPAP 5 to 10 cm H2O with heated humidity and mask of choice     ----- Message ----- From: Darius Bump Sent: 02/08/2023   9:22 AM EDT To: Erin Fulling, MD

## 2023-02-16 ENCOUNTER — Ambulatory Visit: Payer: Medicaid Other | Admitting: Cardiology

## 2023-03-29 ENCOUNTER — Encounter: Payer: Self-pay | Admitting: *Deleted

## 2023-04-05 NOTE — Progress Notes (Signed)
Cardiology Office Note    Date:  04/08/2023   ID:  Sheila [name], DOB [birth year], MRN [id number]  PCP:  Alease Medina, MD  Cardiologist:  Julien Nordmann, MD  Electrophysiologist:  Lanier Prude, MD   Chief Complaint: Follow up  History of Present Illness:   Sheila [name] is a 62 y.o. female with history of normal coronary arteries by LHC in 11/2022, PAF, atrial flutter, possible SVT, WCT, HFpEF, PAD and subclavian stenosis status post stenting in 2019 followed by vascular surgery, HTN, HLD, COPD with tobacco use, and recently diagnosed OSA who presents for follow up of HFpEF.   She was admitted to [facility] in 11/2022 with A-fib with RVR and NSTEMI.  High-sensitivity troponin trended up to 1400.  She converted to sinus rhythm with IV Cardizem.  Echo showed an EF of 55 to 60%, no regional wall motion abnormalities, mild LVH, grade 2 diastolic dysfunction, normal RV systolic function and ventricular cavity size, and aortic valve sclerosis without evidence of stenosis.  LHC during the admission showed normal coronary arteries with mildly elevated LVEDP.  She was discharged on apixaban and carvedilol.  She presented to [facility] on 12/10/2022 with sudden onset of dizziness, diaphoresis, substernal chest pain, and palpitations.  Upon arrival she was tachycardic rates in the 230s bpm.  Initial rhythm showed WCT which appeared different from her prior A-fib.  Systolic blood pressure was 83 mmHg.  She underwent emergent DCCV with subsequent conversion to rapid A-fib and SVT, for which she received 300 mg of IV amiodarone push, and was started on amiodarone drip with maintenance of sinus rhythm.  It was also noted after cardioversion versus amiodarone bolus that she had junctional bradycardia with rates in the 30s bpm.  She was subsequently transferred to [facility] where she remained in sinus rhythm and was evaluated by EP with initial rhythm felt to be atrial flutter versus SVT with aberrancy.  EP recommended  continuing amiodarone and apixaban with favoring of EP study and catheter ablation of A-fib, flutter, and possible SVT in the outpatient setting.  She was seen by general cardiology in 12/2022 and was without symptoms of angina or cardiac decompensation.  She continued to note intermittent palpitations.  Bradycardia precluded escalation of beta-blocker.  Zio patch was applied, to evaluate for breakthrough arrhythmia, which showed a predominant rhythm of sinus with an average rate of 74 (range 40-279 bpm), 352 runs of SVT lasting up to 59.9 seconds, and rare atrial and ventricular ectopy.  She was evaluated by EP in 01/2023 and reported worsening dyspnea following transition in her inhaler secondary to financial constraints.  She continued to feel intermittent palpitations.  Given respiratory status, she was not felt to be a candidate for catheter ablation at that time.  Carvedilol was titrated to 6.25 mg twice daily.  She was not felt to be an acceptable candidate for noncardiac procedure given pulmonary status.  She comes in doing well from a cardiac perspective and is without symptoms of angina or cardiac decompensation.  She continues to note intermittent palpitations, at times worse than what she has previously experienced.  No symptoms of frank chest pain.  No dyspnea, dizziness, presyncope, or syncope.  Reports her breathing is back to baseline.  Has recently been diagnosed with OSA, declined CPAP.  She notes an intermittent occipital headache without red flag symptoms.  Reports well-controlled blood pressure at outside offices and at home, though uses wrist BP cuff at home.  She does watch her  sodium intake.  Adherent to cardiac medications, including OAC without missing any doses.  She has already taken her antihypertensives this morning.  No falls or symptoms concerning for bleeding.  Continues to smoke.   Labs independently reviewed: 01/2023 - potassium 3.2, BUN 13, serum creatinine 0.93, albumin  3.4, AST/ALT not elevated, TSH 5.475, free T4 normal 12/2022 - magnesium 2.0, Hgb 14.0, PLT 436 11/2022 - TC 193, TG 188, HDL 39, LDL 116   Past Medical History:  Diagnosis Date   Aorto-iliac atherosclerosis (HCC) 01/06/2016   Asthma    COPD (chronic obstructive pulmonary disease) (HCC)    Hypertension    Osteoporosis    Tobacco abuse 01/06/2016   Vitamin D deficiency 01/06/2016    Past Surgical History:  Procedure Laterality Date   ANKLE FRACTURE SURGERY Left 11/07/2009   left ankle hardware removal     left ankle surgery     LEFT HEART CATH AND CORONARY ANGIOGRAPHY N/A 11/29/2022   Procedure: LEFT HEART CATH AND CORONARY ANGIOGRAPHY;  Surgeon: Iran Ouch, MD;  Location: [facility] INVASIVE CV LAB;  Service: Cardiovascular;  Laterality: N/A;   LOWER EXTREMITY ANGIOGRAPHY Left 10/17/2017   Procedure: LOWER EXTREMITY ANGIOGRAPHY;  Surgeon: Annice Needy, MD;  Location: [facility] INVASIVE CV LAB;  Service: Cardiovascular;  Laterality: Left;   TUBAL LIGATION      Current Medications: Current Meds  Medication Sig   acetaminophen (TYLENOL) 500 MG tablet Take 500-1,000 mg by mouth every 6 (six) hours as needed for mild pain or moderate pain.   albuterol (PROVENTIL) (2.5 MG/3ML) 0.083% nebulizer solution Take 2.5 mg by nebulization 3 (three) times daily.   ALPRAZolam (XANAX) 0.5 MG tablet Take 0.5 mg by mouth 4 (four) times daily as needed for anxiety.   amiodarone (PACERONE) 200 MG tablet Table 2 tablets twice a day for 5 days, then 2 tablets daily for 5 days, then 1 tablet daily thereafter   apixaban (ELIQUIS) 5 MG TABS tablet Take 1 tablet (5 mg total) by mouth 2 (two) times daily.   aspirin EC 81 MG tablet Take 1 tablet (81 mg total) by mouth daily. Swallow whole.   atorvastatin (LIPITOR) 40 MG tablet Take 1 tablet (40 mg total) by mouth at bedtime.   carvedilol (COREG) 6.25 MG tablet Take 1 tablet (6.25 mg total) by mouth 2 (two) times daily.   fluticasone-salmeterol (ADVAIR DISKUS) 500-50  MCG/ACT AEPB Inhale 1 puff into the lungs in the morning and at bedtime.   losartan (COZAAR) 100 MG tablet Take 1 tablet (100 mg total) by mouth daily.   omeprazole (PRILOSEC) 40 MG capsule Take 40 mg by mouth daily.   oxyCODONE-acetaminophen (PERCOCET) 7.5-325 MG tablet Take 1 tablet by mouth every 8 (eight) hours as needed for severe pain.   potassium chloride SA (KLOR-CON M) 20 MEQ tablet Take 1 tablet (20 mEq total) by mouth 2 (two) times daily.   [DISCONTINUED] amLODipine (NORVASC) 5 MG tablet Take 1 tablet (5 mg total) by mouth daily.    Allergies:   Peach flavor, Vicodin [hydrocodone-acetaminophen], Vicodin [hydrocodone-acetaminophen], Hct [hydrochlorothiazide], and Tramadol   Social History   Socioeconomic History   Marital status: Divorced    Spouse name: Not on file   Number of children: Not on file   Years of education: Not on file   Highest education level: GED or equivalent  Occupational History   Occupation: unemployed  Tobacco Use   Smoking status: Every Day    Current packs/day: 1.00    Average  packs/day: 1 pack/day for 30.0 years (30.0 ttl pk-yrs)    Types: Cigarettes   Smokeless tobacco: Never   Tobacco comments:    1 PPD - 01/24/2023 khj  Vaping Use   Vaping status: Never Used  Substance and Sexual Activity   Alcohol use: No    Alcohol/week: 0.0 standard drinks of alcohol   Drug use: No   Sexual activity: Never  Other Topics Concern   Not on file  Social History Narrative   Food stamps for both her and friend she has been with for 23 years. Some months run low, but always make it.    Social Determinants of Health   Financial Resource Strain: Medium Risk (06/13/2018)   Overall Financial Resource Strain (CARDIA)    Difficulty of Paying Living Expenses: Somewhat hard  Food Insecurity: No Food Insecurity (12/11/2022)   Hunger Vital Sign    Worried About Running Out of Food in the Last Year: Never true    Ran Out of Food in the Last Year: Never true   Transportation Needs: No Transportation Needs (12/11/2022)   PRAPARE - Administrator, Civil Service (Medical): No    Lack of Transportation (Non-Medical): No  Physical Activity: Sufficiently Active (01/10/2018)   Exercise Vital Sign    Days of Exercise per Week: 6 days    Minutes of Exercise per Session: 40 min  Stress: Stress Concern Present (01/10/2018)   [employer] of Occupational Health - Occupational Stress Questionnaire    Feeling of Stress : To some extent  Social Connections: Moderately Integrated (01/10/2018)   Social Connection and Isolation Panel [NHANES]    Frequency of Communication with Friends and Family: More than three times a week    Frequency of Social Gatherings with Friends and Family: Three times a week    Attends Religious Services: 1 to 4 times per year    Active Member of Clubs or Organizations: No    Attends Banker Meetings: Never    Marital Status: Living with partner     Family History:  The patient's family history includes COPD in her father and mother; Heart failure in her father and mother.  ROS:   12-point review of systems is negative unless otherwise noted in the HPI.   EKGs/Labs/Other Studies Reviewed:    Studies reviewed were summarized above. The additional studies were reviewed today:  Zio patch 12/2022: Normal sinus rhythm Patient had a min HR of 40 bpm, max HR of 279 bpm, and avg HR of 74 bpm.   352 Supraventricular Tachycardia runs occurred, the run with the fastest interval lasting 10.2 secs with a max rate of 279 bpm, the longest lasting 59.9 secs with an avg rate of 179 bpm. Episodes of Atrial tachycardia with possible aberrancy.  Supraventricular Tachycardia was detected within +/- 45 seconds of symptomatic patient event(s).    Isolated SVEs were rare (<1.0%), SVE Couplets were rare (<1.0%), and SVE Triplets were rare (<1.0%).  Isolated VEs were rare (<1.0%), and no VE Couplets or VE Triplets were  present. __________  [facility] 11/29/2022: 1.  Normal coronary arteries. 2.  Left ventricular angiography was not performed.  EF was normal by echo. 3.  Severely elevated systolic blood pressure with mildly elevated left ventricular end-diastolic pressure.   Recommendations: Suspect that elevated troponin is due to type II myocardial infarction due to A-fib with RVR and severely elevated blood pressure. Heparin drip to be resumed at 6 PM.  This can be transition to  Eliquis tomorrow. Continue blood pressure control.  I increased both carvedilol and losartan. __________   2D echo 11/28/2022: 1. Left ventricular ejection fraction, by estimation, is 55 to 60%. The  left ventricle has normal function. The left ventricle has no regional  wall motion abnormalities. There is mild left ventricular hypertrophy.  Left ventricular diastolic parameters  are consistent with Grade II diastolic dysfunction (pseudonormalization).   2. Right ventricular systolic function is normal. The right ventricular  size is normal.   3. The mitral valve is normal in structure. No evidence of mitral valve  regurgitation.   4. The aortic valve was not well visualized. Aortic valve regurgitation  is not visualized. Aortic valve sclerosis is present, with no evidence of  aortic valve stenosis.   5. The inferior vena cava is normal in size with greater than 50%  respiratory variability, suggesting right atrial pressure of 3 mmHg.   EKG:  EKG is ordered today.  The EKG ordered today demonstrates sinus bradycardia, 53 bpm, left axis deviation, incomplete RBBB, LVH with early repolarization abnormality, consistent with prior tracing  Recent Labs: 12/10/2022: B Natriuretic Peptide 75.0 12/31/2022: Hemoglobin 14.0; Magnesium 2.0; Platelets 436 02/03/2023: ALT 16; BUN 13; Creatinine, Ser 0.93; Potassium 3.2; Sodium 139; TSH 5.475  Recent Lipid Panel    Component Value Date/Time   CHOL 193 11/27/2022 0423   CHOL 119 08/17/2018  1101   TRIG 188 (H) 11/27/2022 0423   HDL 39 (L) 11/27/2022 0423   HDL 41 08/17/2018 1101   CHOLHDL 4.9 11/27/2022 0423   VLDL 38 11/27/2022 0423   LDLCALC 116 (H) 11/27/2022 0423   LDLCALC 56 08/17/2018 1101    PHYSICAL EXAM:    VS:  BP (!) 180/90   Pulse (!) 53   Ht 5\' 2"  (1.575 m)   Wt 157 lb 6.4 oz (71.4 kg)   LMP 08/09/2008 (Within Years)   SpO2 98%   BMI 28.79 kg/m   BMI: Body mass index is 28.79 kg/m.  Physical Exam Vitals reviewed.  Constitutional:      Appearance: She is well-developed.  HENT:     Head: Normocephalic and atraumatic.  Eyes:     General:        Right eye: No discharge.        Left eye: No discharge.  Neck:     Vascular: No JVD.  Cardiovascular:     Rate and Rhythm: Regular rhythm. Bradycardia present.     Heart sounds: Normal heart sounds, S1 normal and S2 normal. Heart sounds not distant. No midsystolic click and no opening snap. No murmur heard.    No friction rub.  Pulmonary:     Effort: Pulmonary effort is normal. No respiratory distress.     Breath sounds: Normal breath sounds. No decreased breath sounds, wheezing or rales.  Chest:     Chest wall: No tenderness.  Abdominal:     General: There is no distension.  Musculoskeletal:     Cervical back: Normal range of motion.     Right lower leg: No edema.     Left lower leg: No edema.  Skin:    General: Skin is warm and dry.     Nails: There is no clubbing.  Neurological:     Mental Status: She is alert and oriented to person, place, and time.  Psychiatric:        Speech: Speech normal.        Behavior: Behavior normal.  Thought Content: Thought content normal.        Judgment: Judgment normal.     Wt Readings from Last 3 Encounters:  04/08/23 157 lb 6.4 oz (71.4 kg)  02/03/23 162 lb (73.5 kg)  01/24/23 160 lb 12.8 oz (72.9 kg)     ASSESSMENT & PLAN:   PAF/atrial flutter/possible SVT/WCT/presyncope: Maintaining sinus rhythm with a bradycardic rate.  Palpitations  largely unchanged.  Followed by EP.  Remains on amiodarone 200 mg daily and carvedilol 6.25 mg twice daily.  Long-term amiodarone use is not ideal given underlying COPD and in the setting of patient's age.  Reports breathing is back to baseline.  Recent AST/ALT normal with mildly elevated TSH.  Follow-up with the EP ongoing management.  HFpEF: Euvolemic, compensated, not requiring standing loop diuretic.  HTN: Blood pressure is elevated in the office today, though typically well-controlled.  Titrate amlodipine to 5 mg twice daily with continuation of carvedilol 6.25 mg twice daily and losartan 100 mg daily.  Low-sodium diet recommended.  HLD: LDL 116 in 11/2022.  Remains on atorvastatin 40 mg.  COPD with tobacco use: Stable without acute exacerbation.  Complete smoking cessation is recommended.  Subclavian stenosis: Status post left subclavian stenting.  Blood pressures should be obtained in the right upper extremity.  Followed by vascular surgery.  OSA: Has declined CPAP.  She was made aware that EP will require management of sleep apnea for consideration of catheter ablation.   Disposition: F/u with Dr. Mariah Milling or an APP in 2 weeks and EP as directed next month.   Medication Adjustments/Labs and Tests Ordered: Current medicines are reviewed at length with the patient today.  Concerns regarding medicines are outlined above. Medication changes, Labs and Tests ordered today are summarized above and listed in the Patient Instructions accessible in Encounters.   Signed, Eula Listen, PA-C 04/08/2023 12:39 PM     [facility] [place] [address] [place], [place] [address] [phone]

## 2023-04-08 ENCOUNTER — Encounter: Payer: Self-pay | Admitting: Physician Assistant

## 2023-04-08 ENCOUNTER — Ambulatory Visit: Payer: Medicaid Other | Admitting: Physician Assistant

## 2023-04-08 VITALS — BP 180/90 | HR 53 | Ht 62.0 in | Wt 157.4 lb

## 2023-04-08 DIAGNOSIS — I480 Paroxysmal atrial fibrillation: Secondary | ICD-10-CM | POA: Diagnosis not present

## 2023-04-08 DIAGNOSIS — I10 Essential (primary) hypertension: Secondary | ICD-10-CM

## 2023-04-08 DIAGNOSIS — E782 Mixed hyperlipidemia: Secondary | ICD-10-CM

## 2023-04-08 DIAGNOSIS — I5032 Chronic diastolic (congestive) heart failure: Secondary | ICD-10-CM | POA: Diagnosis not present

## 2023-04-08 DIAGNOSIS — G4733 Obstructive sleep apnea (adult) (pediatric): Secondary | ICD-10-CM

## 2023-04-08 DIAGNOSIS — J449 Chronic obstructive pulmonary disease, unspecified: Secondary | ICD-10-CM

## 2023-04-08 DIAGNOSIS — I4892 Unspecified atrial flutter: Secondary | ICD-10-CM | POA: Diagnosis not present

## 2023-04-08 DIAGNOSIS — R000 Tachycardia, unspecified: Secondary | ICD-10-CM

## 2023-04-08 DIAGNOSIS — I771 Stricture of artery: Secondary | ICD-10-CM

## 2023-04-08 MED ORDER — AMLODIPINE BESYLATE 5 MG PO TABS: 5.0000 mg | ORAL_TABLET | Freq: Two times a day (BID) | ORAL | 3 refills | Status: DC

## 2023-04-08 NOTE — Patient Instructions (Signed)
Medication Instructions:  Your physician recommends the following medication changes.  INCREASE: Amlodipine 5 mg TWICE daily *If you need a refill on your cardiac medications before your next appointment, please call your pharmacy*   Lab Work: NONE If you have labs (blood work) drawn today and your tests are completely normal, you will receive your results only by: MyChart Message (if you have MyChart) OR A paper copy in the mail If you have any lab test that is abnormal or we need to change your treatment, we will call you to review the results.   Testing/Procedures: NONE   Follow-Up: At [facility], you and your health needs are our priority.  As part of our continuing mission to provide you with exceptional heart care, we have created designated Provider Care Teams.  These Care Teams include your primary Cardiologist (physician) and Advanced Practice Providers (APPs -  Physician Assistants and Nurse Practitioners) who all work together to provide you with the care you need, when you need it.  We recommend signing up for the patient portal called "MyChart".  Sign up information is provided on this After Visit Summary.  MyChart is used to connect with patients for Virtual Visits (Telemedicine).  Patients are able to view lab/test results, encounter notes, upcoming appointments, etc.  Non-urgent messages can be sent to your provider as well.   To learn more about what you can do with MyChart, go to [email or web address].    Your next appointment:   2 to 3 week(s) after your   Provider:   You may see Julien Nordmann, MD or one of the following Advanced Practice Providers on your designated Care Team:   Nicolasa Ducking, NP Eula Listen, PA-C Cadence Fransico Michael, PA-C Charlsie Quest, NP

## 2023-04-24 NOTE — Progress Notes (Unsigned)
Cardiology Office Note    Date:  04/27/2023   ID:  Sheila [name], DOB 11-03-1960, MRN [id number]  PCP:  Alease Medina, MD  Cardiologist:  Julien Nordmann, MD  Electrophysiologist:  Lanier Prude, MD   Chief Complaint: Follow-up  History of Present Illness:   Sheila [name] is a 62 y.o. female with history of normal coronary arteries by LHC in 11/2022, PAF, atrial flutter, possible SVT, WCT, HFpEF, PAD and subclavian stenosis status post stenting in 2019 followed by vascular surgery, HTN, HLD, COPD with tobacco use, and recently diagnosed OSA who presents for follow up of HFpEF.   She was admitted to [facility] in 11/2022 with A-fib with RVR and NSTEMI.  High-sensitivity troponin trended up to 1400.  She converted to sinus rhythm with IV Cardizem.  Echo showed an EF of 55 to 60%, no regional wall motion abnormalities, mild LVH, grade 2 diastolic dysfunction, normal RV systolic function and ventricular cavity size, and aortic valve sclerosis without evidence of stenosis.  LHC during the admission showed normal coronary arteries with mildly elevated LVEDP.  She was discharged on apixaban and carvedilol.  She presented to [facility] on 12/10/2022 with sudden onset of dizziness, diaphoresis, substernal chest pain, and palpitations.  Upon arrival she was tachycardic rates in the 230s bpm.  Initial rhythm showed WCT which appeared different from her prior A-fib.  Systolic blood pressure was 83 mmHg.  She underwent emergent DCCV with subsequent conversion to rapid A-fib and SVT, for which she received 300 mg of IV amiodarone push, and was started on amiodarone drip with maintenance of sinus rhythm.  It was also noted after cardioversion versus amiodarone bolus that she had junctional bradycardia with rates in the 30s bpm.  She was subsequently transferred to [facility] where she remained in sinus rhythm and was evaluated by EP with initial rhythm felt to be atrial flutter versus SVT with aberrancy.  EP recommended  continuing amiodarone and apixaban with favoring of EP study and catheter ablation of A-fib, flutter, and possible SVT in the outpatient setting.   She was seen by general cardiology in 12/2022 and continued to note intermittent palpitations.  Bradycardia precluded escalation of beta-blocker.  Zio patch showed a predominant rhythm of sinus with an average rate of 74 (range 40-279 bpm), 352 runs of SVT lasting up to 59.9 seconds, and rare atrial and ventricular ectopy.  She was evaluated by EP in 01/2023 and reported worsening dyspnea following transition in her inhaler secondary to financial constraints.  She continued to feel intermittent palpitations.  Given respiratory status, she was not felt to be a candidate for catheter ablation at that time.  Carvedilol was titrated to 6.25 mg twice daily.  She was last seen by general cardiology on 04/08/2023 and continued to note intermittent palpitations, at times worse than what she had previously experienced.  She had been diagnosed with OSA and declined CPAP.  Dyspnea was improved.  She was advised to follow-up with EP for ongoing management of her arrhythmia burden.  Amlodipine was titrated to 5 mg twice daily with continuation of carvedilol and losartan.  She comes in doing well from a cardiac perspective and is without symptoms of angina or cardiac decompensation.  Palpitations are unchanged when compared to last visit.  No frank chest pain.  Dyspnea is back to baseline.  No dizziness, presyncope, or syncope.  Has tolerated the titration of amlodipine.  Home BP readings are largely in the 160s to 180s reading in  the upper 150s systolic.  Adherent to cardiac medications, including OAC.  Falls or symptoms concerning for bleeding.  Continues to smoke, now down to a little less than 1 pack daily.   Labs independently reviewed: 01/2023 - potassium 3.2, BUN 13, serum creatinine 0.93, albumin 3.4, AST/ALT not elevated, TSH 5.475, free T4 normal 12/2022 - magnesium 2.0,  Hgb 14.0, PLT 436 11/2022 - TC 193, TG 188, HDL 39, LDL 116  Past Medical History:  Diagnosis Date   Aorto-iliac atherosclerosis (HCC) 01/06/2016   Asthma    COPD (chronic obstructive pulmonary disease) (HCC)    Hypertension    Osteoporosis    Tobacco abuse 01/06/2016   Vitamin D deficiency 01/06/2016    Past Surgical History:  Procedure Laterality Date   ANKLE FRACTURE SURGERY Left 11/07/2009   left ankle hardware removal     left ankle surgery     LEFT HEART CATH AND CORONARY ANGIOGRAPHY N/A 11/29/2022   Procedure: LEFT HEART CATH AND CORONARY ANGIOGRAPHY;  Surgeon: Iran Ouch, MD;  Location: [facility] INVASIVE CV LAB;  Service: Cardiovascular;  Laterality: N/A;   LOWER EXTREMITY ANGIOGRAPHY Left 10/17/2017   Procedure: LOWER EXTREMITY ANGIOGRAPHY;  Surgeon: Annice Needy, MD;  Location: [facility] INVASIVE CV LAB;  Service: Cardiovascular;  Laterality: Left;   TUBAL LIGATION      Current Medications: Current Meds  Medication Sig   acetaminophen (TYLENOL) 500 MG tablet Take 500-1,000 mg by mouth every 6 (six) hours as needed for mild pain or moderate pain.   albuterol (PROVENTIL) (2.5 MG/3ML) 0.083% nebulizer solution Take 2.5 mg by nebulization 3 (three) times daily.   ALPRAZolam (XANAX) 0.5 MG tablet Take 0.5 mg by mouth 4 (four) times daily as needed for anxiety.   amiodarone (PACERONE) 200 MG tablet Table 2 tablets twice a day for 5 days, then 2 tablets daily for 5 days, then 1 tablet daily thereafter   amLODipine (NORVASC) 5 MG tablet Take 1 tablet (5 mg total) by mouth in the morning and at bedtime.   aspirin EC 81 MG tablet Take 1 tablet (81 mg total) by mouth daily. Swallow whole.   atorvastatin (LIPITOR) 40 MG tablet Take 1 tablet (40 mg total) by mouth at bedtime.   carvedilol (COREG) 6.25 MG tablet Take 1 tablet (6.25 mg total) by mouth 2 (two) times daily.   fluticasone-salmeterol (ADVAIR DISKUS) 500-50 MCG/ACT AEPB Inhale 1 puff into the lungs in the morning and at bedtime.    omeprazole (PRILOSEC) 40 MG capsule Take 40 mg by mouth daily.   oxyCODONE-acetaminophen (PERCOCET) 7.5-325 MG tablet Take 1 tablet by mouth every 8 (eight) hours as needed for severe pain.   potassium chloride SA (KLOR-CON M) 20 MEQ tablet Take 1 tablet (20 mEq total) by mouth 2 (two) times daily.   [DISCONTINUED] apixaban (ELIQUIS) 5 MG TABS tablet Take 1 tablet (5 mg total) by mouth 2 (two) times daily.   [DISCONTINUED] losartan (COZAAR) 100 MG tablet Take 1 tablet (100 mg total) by mouth daily.   [DISCONTINUED] sacubitril-valsartan (ENTRESTO) 49-51 MG Take 1 tablet by mouth 2 (two) times daily.    Allergies:   Peach flavor, Vicodin [hydrocodone-acetaminophen], Vicodin [hydrocodone-acetaminophen], Hct [hydrochlorothiazide], and Tramadol   Social History   Socioeconomic History   Marital status: Divorced    Spouse name: Not on file   Number of children: Not on file   Years of education: Not on file   Highest education level: GED or equivalent  Occupational History   Occupation: unemployed  Tobacco Use   Smoking status: Every Day    Current packs/day: 1.00    Average packs/day: 1 pack/day for 30.0 years (30.0 ttl pk-yrs)    Types: Cigarettes   Smokeless tobacco: Never   Tobacco comments:    1 PPD - 01/24/2023 khj  Vaping Use   Vaping status: Never Used  Substance and Sexual Activity   Alcohol use: No    Alcohol/week: 0.0 standard drinks of alcohol   Drug use: No   Sexual activity: Never  Other Topics Concern   Not on file  Social History Narrative   Food stamps for both her and friend she has been with for 23 years. Some months run low, but always make it.    Social Determinants of Health   Financial Resource Strain: Medium Risk (06/13/2018)   Overall Financial Resource Strain (CARDIA)    Difficulty of Paying Living Expenses: Somewhat hard  Food Insecurity: No Food Insecurity (12/11/2022)   Hunger Vital Sign    Worried About Running Out of Food in the Last Year: Never  true    Ran Out of Food in the Last Year: Never true  Transportation Needs: No Transportation Needs (12/11/2022)   PRAPARE - Administrator, Civil Service (Medical): No    Lack of Transportation (Non-Medical): No  Physical Activity: Sufficiently Active (01/10/2018)   Exercise Vital Sign    Days of Exercise per Week: 6 days    Minutes of Exercise per Session: 40 min  Stress: Stress Concern Present (01/10/2018)   [employer] of Occupational Health - Occupational Stress Questionnaire    Feeling of Stress : To some extent  Social Connections: Moderately Integrated (01/10/2018)   Social Connection and Isolation Panel [NHANES]    Frequency of Communication with Friends and Family: More than three times a week    Frequency of Social Gatherings with Friends and Family: Three times a week    Attends Religious Services: 1 to 4 times per year    Active Member of Clubs or Organizations: No    Attends Banker Meetings: Never    Marital Status: Living with partner     Family History:  The patient's family history includes COPD in her father and mother; Heart failure in her father and mother.  ROS:   12-point review of systems is negative unless otherwise noted in the HPI.   EKGs/Labs/Other Studies Reviewed:    Studies reviewed were summarized above. The additional studies were reviewed today:  Zio patch 12/2022: Normal sinus rhythm Patient had a min HR of 40 bpm, max HR of 279 bpm, and avg HR of 74 bpm.   352 Supraventricular Tachycardia runs occurred, the run with the fastest interval lasting 10.2 secs with a max rate of 279 bpm, the longest lasting 59.9 secs with an avg rate of 179 bpm. Episodes of Atrial tachycardia with possible aberrancy.  Supraventricular Tachycardia was detected within +/- 45 seconds of symptomatic patient event(s).    Isolated SVEs were rare (<1.0%), SVE Couplets were rare (<1.0%), and SVE Triplets were rare (<1.0%).  Isolated VEs were rare  (<1.0%), and no VE Couplets or VE Triplets were present. __________   [facility] 11/29/2022: 1.  Normal coronary arteries. 2.  Left ventricular angiography was not performed.  EF was normal by echo. 3.  Severely elevated systolic blood pressure with mildly elevated left ventricular end-diastolic pressure.   Recommendations: Suspect that elevated troponin is due to type II myocardial infarction due to A-fib with RVR  and severely elevated blood pressure. Heparin drip to be resumed at 6 PM.  This can be transition to Eliquis tomorrow. Continue blood pressure control.  I increased both carvedilol and losartan. __________   2D echo 11/28/2022: 1. Left ventricular ejection fraction, by estimation, is 55 to 60%. The  left ventricle has normal function. The left ventricle has no regional  wall motion abnormalities. There is mild left ventricular hypertrophy.  Left ventricular diastolic parameters  are consistent with Grade II diastolic dysfunction (pseudonormalization).   2. Right ventricular systolic function is normal. The right ventricular  size is normal.   3. The mitral valve is normal in structure. No evidence of mitral valve  regurgitation.   4. The aortic valve was not well visualized. Aortic valve regurgitation  is not visualized. Aortic valve sclerosis is present, with no evidence of  aortic valve stenosis.   5. The inferior vena cava is normal in size with greater than 50%  respiratory variability, suggesting right atrial pressure of 3 mmHg.   EKG:  EKG is not ordered today.    Recent Labs: 12/10/2022: B Natriuretic Peptide 75.0 12/31/2022: Hemoglobin 14.0; Magnesium 2.0; Platelets 436 02/03/2023: ALT 16; BUN 13; Creatinine, Ser 0.93; Potassium 3.2; Sodium 139; TSH 5.475  Recent Lipid Panel    Component Value Date/Time   CHOL 193 11/27/2022 0423   CHOL 119 08/17/2018 1101   TRIG 188 (H) 11/27/2022 0423   HDL 39 (L) 11/27/2022 0423   HDL 41 08/17/2018 1101   CHOLHDL 4.9 11/27/2022  0423   VLDL 38 11/27/2022 0423   LDLCALC 116 (H) 11/27/2022 0423   LDLCALC 56 08/17/2018 1101    PHYSICAL EXAM:    VS:  BP (!) 160/94 (BP Location: Right Arm, Patient Position: Sitting, Cuff Size: Normal)   Pulse (!) 53   Ht 5\' 2"  (1.575 m)   Wt 157 lb 6.4 oz (71.4 kg)   LMP 08/09/2008 (Within Years)   SpO2 92%   BMI 28.79 kg/m   BMI: Body mass index is 28.79 kg/m.  Physical Exam Vitals reviewed.  Constitutional:      Appearance: She is well-developed.  HENT:     Head: Normocephalic and atraumatic.  Eyes:     General:        Right eye: No discharge.        Left eye: No discharge.  Neck:     Vascular: No JVD.  Cardiovascular:     Rate and Rhythm: Regular rhythm. Bradycardia present.     Heart sounds: Normal heart sounds, S1 normal and S2 normal. Heart sounds not distant. No midsystolic click and no opening snap. No murmur heard.    No friction rub.  Pulmonary:     Effort: Pulmonary effort is normal. No respiratory distress.     Breath sounds: Normal breath sounds. No decreased breath sounds, wheezing or rales.  Chest:     Chest wall: No tenderness.  Abdominal:     General: There is no distension.  Musculoskeletal:     Cervical back: Normal range of motion.     Right lower leg: No edema.     Left lower leg: No edema.  Skin:    General: Skin is warm and dry.     Nails: There is no clubbing.  Neurological:     Mental Status: She is alert and oriented to person, place, and time.  Psychiatric:        Speech: Speech normal.        Behavior:  Behavior normal.        Thought Content: Thought content normal.        Judgment: Judgment normal.     Wt Readings from Last 3 Encounters:  04/27/23 157 lb 6.4 oz (71.4 kg)  04/08/23 157 lb 6.4 oz (71.4 kg)  02/03/23 162 lb (73.5 kg)     ASSESSMENT & PLAN:   PAF/atrial flutter/possible SVT/WCT/presyncope: Maintaining sinus rhythm with bradycardic rate.  Palpitation burden unchanged.  Followed by EP.  Remains on amiodarone  200 mg daily and carvedilol 6.25 mg twice daily. Long-term amiodarone use is not ideal given underlying COPD and in the setting of patient's age.  Recent AST/ALT normal with mildly elevated TSH. Follow-up with the EP ongoing management.   HFpEF: Euvolemic and well compensated, not requiring a standing loop diuretic.  Transition from losartan to Entresto 49/51 mg twice daily with a follow-up BMP when she is seen in the office next week.  Would look to further escalate GDMT as able and follow-up with addition of SGLT2 inhibitor and MRA.  HTN: Blood pressure remains elevated in the office today, though improved.  Transition from losartan to [facility] as outlined above with continuation of carvedilol and amlodipine.  Bradycardia precludes escalation of beta-blocker.  HLD: LDL 116 in 11/2022.  Remains on atorvastatin 40 mg.  COPD with tobacco use: Without acute exacerbation.  Working on tapering smoking.  Complete cessation is recommended.  Subclavian stenosis: Status post up left subclavian artery stenting.  Blood pressures should be obtained in the right upper extremity.  Followed by vascular surgery.  OSA: Has declined CPAP.  She has been made aware that EP will require management of sleep apnea for consideration of catheter ablation.   Disposition: F/u with Dr. Mariah Milling or an APP in 1 month, and EP as directed.    Medication Adjustments/Labs and Tests Ordered: Current medicines are reviewed at length with the patient today.  Concerns regarding medicines are outlined above. Medication changes, Labs and Tests ordered today are summarized above and listed in the Patient Instructions accessible in Encounters.   SignedEula Listen, PA-C 04/27/2023 12:08 PM     [facility] [place] [address] [place], [place] [address] [phone]

## 2023-04-27 ENCOUNTER — Encounter: Payer: Self-pay | Admitting: Physician Assistant

## 2023-04-27 ENCOUNTER — Ambulatory Visit: Payer: Medicaid Other | Attending: Physician Assistant | Admitting: Physician Assistant

## 2023-04-27 VITALS — BP 160/94 | HR 53 | Ht 62.0 in | Wt 157.4 lb

## 2023-04-27 DIAGNOSIS — Z720 Tobacco use: Secondary | ICD-10-CM

## 2023-04-27 DIAGNOSIS — I480 Paroxysmal atrial fibrillation: Secondary | ICD-10-CM | POA: Diagnosis not present

## 2023-04-27 DIAGNOSIS — E782 Mixed hyperlipidemia: Secondary | ICD-10-CM

## 2023-04-27 DIAGNOSIS — I771 Stricture of artery: Secondary | ICD-10-CM

## 2023-04-27 DIAGNOSIS — I10 Essential (primary) hypertension: Secondary | ICD-10-CM

## 2023-04-27 DIAGNOSIS — G4733 Obstructive sleep apnea (adult) (pediatric): Secondary | ICD-10-CM

## 2023-04-27 DIAGNOSIS — J449 Chronic obstructive pulmonary disease, unspecified: Secondary | ICD-10-CM

## 2023-04-27 DIAGNOSIS — I4892 Unspecified atrial flutter: Secondary | ICD-10-CM

## 2023-04-27 DIAGNOSIS — I5032 Chronic diastolic (congestive) heart failure: Secondary | ICD-10-CM

## 2023-04-27 DIAGNOSIS — R000 Tachycardia, unspecified: Secondary | ICD-10-CM

## 2023-04-27 MED ORDER — APIXABAN 5 MG PO TABS: 5.0000 mg | ORAL_TABLET | Freq: Two times a day (BID) | ORAL | 3 refills | Status: DC

## 2023-04-27 MED ORDER — ENTRESTO 49-51 MG PO TABS: 1.0000 | ORAL_TABLET | Freq: Two times a day (BID) | ORAL | 3 refills | Status: DC

## 2023-04-27 MED ORDER — ENTRESTO 49-51 MG PO TABS: 1.0000 | ORAL_TABLET | Freq: Two times a day (BID) | ORAL | Status: AC

## 2023-04-27 NOTE — Patient Instructions (Signed)
Medication Instructions:  Your physician recommends the following medication changes.  STOP TAKING: Losartan   START TAKING: Entresto 49/51 twice daily  *If you need a refill on your cardiac medications before your next appointment, please call your pharmacy*   Lab Work: None today If you have labs (blood work) drawn today and your tests are completely normal, you will receive your results only by: MyChart Message (if you have MyChart) OR A paper copy in the mail If you have any lab test that is abnormal or we need to change your treatment, we will call you to review the results.   Testing/Procedures: None   Follow-Up: At [facility], you and your health needs are our priority.  As part of our continuing mission to provide you with exceptional heart care, we have created designated Provider Care Teams.  These Care Teams include your primary Cardiologist (physician) and Advanced Practice Providers (APPs -  Physician Assistants and Nurse Practitioners) who all work together to provide you with the care you need, when you need it.  We recommend signing up for the patient portal called "MyChart".  Sign up information is provided on this After Visit Summary.  MyChart is used to connect with patients for Virtual Visits (Telemedicine).  Patients are able to view lab/test results, encounter notes, upcoming appointments, etc.  Non-urgent messages can be sent to your provider as well.   To learn more about what you can do with MyChart, go to [email or web address].    Your next appointment:   1 month(s)  Provider:   You may see Julien Nordmann, MD or one of the following Advanced Practice Providers on your designated Care Team:   Eula Listen, [place]

## 2023-04-28 ENCOUNTER — Ambulatory Visit: Payer: Medicaid Other | Attending: Internal Medicine

## 2023-04-28 DIAGNOSIS — R0602 Shortness of breath: Secondary | ICD-10-CM | POA: Diagnosis present

## 2023-04-28 DIAGNOSIS — J984 Other disorders of lung: Secondary | ICD-10-CM | POA: Insufficient documentation

## 2023-04-28 MED ORDER — ALBUTEROL SULFATE (2.5 MG/3ML) 0.083% IN NEBU
2.5000 mg | INHALATION_SOLUTION | Freq: Once | RESPIRATORY_TRACT | Status: AC
  Administered 2023-04-28: 2.5 mg via RESPIRATORY_TRACT
  Filled 2023-04-28: qty 3

## 2023-05-06 ENCOUNTER — Encounter: Payer: Self-pay | Admitting: Cardiology

## 2023-05-06 ENCOUNTER — Ambulatory Visit: Payer: Medicaid Other | Attending: Cardiology | Admitting: Cardiology

## 2023-05-06 VITALS — BP 120/80 | HR 63 | Ht 62.0 in | Wt 156.5 lb

## 2023-05-06 DIAGNOSIS — R000 Tachycardia, unspecified: Secondary | ICD-10-CM

## 2023-05-06 DIAGNOSIS — I4892 Unspecified atrial flutter: Secondary | ICD-10-CM | POA: Insufficient documentation

## 2023-05-06 DIAGNOSIS — Z862 Personal history of diseases of the blood and blood-forming organs and certain disorders involving the immune mechanism: Secondary | ICD-10-CM | POA: Insufficient documentation

## 2023-05-06 DIAGNOSIS — G4733 Obstructive sleep apnea (adult) (pediatric): Secondary | ICD-10-CM

## 2023-05-06 DIAGNOSIS — I480 Paroxysmal atrial fibrillation: Secondary | ICD-10-CM | POA: Diagnosis not present

## 2023-05-06 DIAGNOSIS — J449 Chronic obstructive pulmonary disease, unspecified: Secondary | ICD-10-CM

## 2023-05-06 DIAGNOSIS — I10 Essential (primary) hypertension: Secondary | ICD-10-CM

## 2023-05-06 DIAGNOSIS — Z720 Tobacco use: Secondary | ICD-10-CM

## 2023-05-06 NOTE — Patient Instructions (Addendum)
Medication Instructions:  The current medical regimen is effective;  continue present plan and medications.  *If you need a refill on your cardiac medications before your next appointment, please call your pharmacy*  Lab: Your provider would like for you to have following labs drawn in 1-2 weeks BMET.  No lab appointment is needed.  Follow-Up: At [facility], you and your health needs are our priority.  As part of our continuing mission to provide you with exceptional heart care, we have created designated Provider Care Teams.  These Care Teams include your primary Cardiologist (physician) and Advanced Practice Providers (APPs -  Physician Assistants and Nurse Practitioners) who all work together to provide you with the care you need, when you need it.  We recommend signing up for the patient portal called "MyChart".  Sign up information is provided on this After Visit Summary.  MyChart is used to connect with patients for Virtual Visits (Telemedicine).  Patients are able to view lab/test results, encounter notes, upcoming appointments, etc.  Non-urgent messages can be sent to your provider as well.   To learn more about what you can do with MyChart, go to [email or web address].    Your next appointment:   3 month(s)  Provider:   Sherie Don, NP    Other Instructions We will contact you in regards to getting set up for the ablation.

## 2023-05-06 NOTE — Progress Notes (Signed)
Cardiology Office Note Date:  05/06/2023  Patient ID:  Sheila [name], Sheila [name] [birth year], MRN [id number] PCP:  Alease Medina, MD  Cardiologist:  Julien Nordmann, MD Electrophysiologist: Lanier Prude, MD     Chief Complaint: SVT follow-up  History of Present Illness: Sheila [name] is a 62 y.o. female with PMH notable for afib, aflutter, SVT, HTN, HFpEF, PAD s/p subclavian stenting, COPD, OSA, tobacco use; seen today for Lanier Prude, MD for routine electrophysiology followup.  She last saw Dr. Lalla Brothers 01/2023, ablation had been recommended, but could not schedule d/t patient's pulm status. She was maintaining sinus with amiodarone, but did not want to continue long-term d/t young age and pulm status.  In the interim, she has seen PA R. Dunn several times for mgmt of HTN.   On follow-up today, she continues to have palpitation episodes usually everyday. They range in duration from a couple seconds to several hours. Sometimes has chest discomfort and SOB with episodes but not often. No syncope. Her breathing is much improved since the last time she saw Dr. Lalla Brothers. Has follow up with pulm next week.  She continues to smoke cigarettes, but is down to 1/2 - 2/3 ppd, was previously smoking more than a ppd.  She continues to take amiodarone everyday, no GI concerns Diligently takes eliquis BID, no bleeding concerns.    AAD History: Amiodarone   Past Medical History:  Diagnosis Date   Aorto-iliac atherosclerosis (HCC) 01/06/2016   Asthma    COPD (chronic obstructive pulmonary disease) (HCC)    Hypertension    Osteoporosis    Tobacco abuse 01/06/2016   Vitamin D deficiency 01/06/2016    Past Surgical History:  Procedure Laterality Date   ANKLE FRACTURE SURGERY Left 11/07/2009   left ankle hardware removal     left ankle surgery     LEFT HEART CATH AND CORONARY ANGIOGRAPHY N/A 11/29/2022   Procedure: LEFT HEART CATH AND CORONARY ANGIOGRAPHY;  Surgeon: Iran Ouch, MD;   Location: [facility] INVASIVE CV LAB;  Service: Cardiovascular;  Laterality: N/A;   LOWER EXTREMITY ANGIOGRAPHY Left 10/17/2017   Procedure: LOWER EXTREMITY ANGIOGRAPHY;  Surgeon: Annice Needy, MD;  Location: [facility] INVASIVE CV LAB;  Service: Cardiovascular;  Laterality: Left;   TUBAL LIGATION      Current Outpatient Medications  Medication Instructions   acetaminophen (TYLENOL) 500-1,000 mg, Oral, Every 6 hours PRN   albuterol (PROVENTIL) 2.5 mg, Nebulization, 3 times daily   ALPRAZolam (XANAX) 0.5 mg, Oral, 4 times daily PRN   amiodarone (PACERONE) 200 MG tablet Table 2 tablets twice a day for 5 days, then 2 tablets daily for 5 days, then 1 tablet daily thereafter   amLODipine (NORVASC) 5 mg, Oral, 2 times daily   apixaban (ELIQUIS) 5 mg, Oral, 2 times daily   aspirin EC 81 mg, Oral, Daily, Swallow whole.   atorvastatin (LIPITOR) 40 mg, Oral, Daily at bedtime   carvedilol (COREG) 6.25 mg, Oral, 2 times daily   fluticasone-salmeterol (ADVAIR DISKUS) 500-50 MCG/ACT AEPB 1 puff, Inhalation, 2 times daily   omeprazole (PRILOSEC) 40 mg, Oral, Daily   oxyCODONE-acetaminophen (PERCOCET) 7.5-325 MG tablet 1 tablet, Oral, Every 8 hours PRN   potassium chloride SA (KLOR-CON M) 20 MEQ tablet 20 mEq, Oral, 2 times daily   predniSONE (DELTASONE) 20 mg, Oral, Daily with breakfast, 10 days   sacubitril-valsartan (ENTRESTO) 49-51 MG 1 tablet, Oral, 2 times daily   Yupelri 175 mcg, Daily    Social History:  The  patient  reports that she has been smoking cigarettes. She has a 30 pack-year smoking history. She has never used smokeless tobacco. She reports that she does not drink alcohol and does not use drugs.   Family History:  The patient's family history includes COPD in her father and mother; Heart failure in her father and mother.  ROS:  Please see the history of present illness. All other systems are reviewed and otherwise negative.   PHYSICAL EXAM:  VS:  BP 120/80 (BP Location: Right Arm, Patient  Position: Sitting, Cuff Size: Normal)   Pulse 63   Ht 5\' 2"  (1.575 m)   Wt 156 lb 8 oz (71 kg)   LMP 08/09/2008 (Within Years)   SpO2 96%   BMI 28.62 kg/m  BMI: Body mass index is 28.62 kg/m.  GEN- The patient is well appearing, alert and oriented x 3 today.   Lungs- Clear to ausculation bilaterally, normal work of breathing.  Heart- Regular rate and rhythm, no murmurs, rubs or gallops Extremities- No peripheral edema, warm, dry   EKG is not ordered. Personal review of EKG from  04/08/2023  shows:  SB, rate 53 LAD,        Recent Labs: 12/10/2022: B Natriuretic Peptide 75.0 12/31/2022: Hemoglobin 14.0; Magnesium 2.0; Platelets 436 02/03/2023: ALT 16; BUN 13; Creatinine, Ser 0.93; Potassium 3.2; Sodium 139; TSH 5.475  11/27/2022: Cholesterol 193; HDL 39; LDL Cholesterol 116; Total CHOL/HDL Ratio 4.9; Triglycerides 188; VLDL 38   CrCl cannot be calculated (Patient's most recent lab result is older than the maximum 21 days allowed.).   Wt Readings from Last 3 Encounters:  05/06/23 156 lb 8 oz (71 kg)  04/27/23 157 lb 6.4 oz (71.4 kg)  04/08/23 157 lb 6.4 oz (71.4 kg)     Additional studies reviewed include: Previous EP, cardiology notes.   Long term monitor, 01/25/2023 Patient had a min HR of 40 bpm, max HR of 279 bpm, and avg HR of 74 bpm.   352 Supraventricular Tachycardia runs occurred, the run with the fastest interval lasting 10.2 secs with a max rate of 279 bpm, the longest lasting 59.9 secs with an avg rate of 179 bpm. Episodes of Atrial tachycardia with possible aberrancy.  Supraventricular Tachycardia was detected within +/- 45 seconds of symptomatic patient event(s).    Isolated SVEs were rare (<1.0%), SVE Couplets were rare (<1.0%), and SVE Triplets were rare (<1.0%).  Isolated VEs were rare (<1.0%), and no VE Couplets or VE Triplets were present.  LHC, 11/29/2022 1.  Normal coronary arteries. 2.  Left ventricular angiography was not performed.  EF was normal by  echo. 3.  Severely elevated systolic blood pressure with mildly elevated left ventricular end-diastolic pressure.  TTE, 11/28/2022  1. Left ventricular ejection fraction, by estimation, is 55 to 60%. The left ventricle has normal function. The left ventricle has no regional wall motion abnormalities. There is mild left ventricular hypertrophy. Left ventricular diastolic parameters are consistent with Grade II diastolic dysfunction (pseudonormalization).   2. Right ventricular systolic function is normal. The right ventricular size is normal.   3. The mitral valve is normal in structure. No evidence of mitral valve regurgitation.   4. The aortic valve was not well visualized. Aortic valve regurgitation is not visualized. Aortic valve sclerosis is present, with no evidence of aortic valve stenosis.   5. The inferior vena cava is normal in size with greater than 50% respiratory variability, suggesting right atrial pressure of 3 mmHg.  ASSESSMENT AND PLAN:  #) parox AFib #) aflutter #) wide-complex tachycardia, ?SVT Continues to have frequent palpitation episodes Tolerating 200mg  amiodarone daily Recent TSH and LFT labs stable, repeat at next follow-up Discussed SVT ablation procedure as previously recommended by Dr. Lalla Brothers. Breathing is significantly improved. Will proceed with schedule at next available.  Continue coreg 6.25mg  BID  #) Hypercoag d/t aprox afib CHA2DS2-VASc Score = 4 [CHF History: 1, HTN History: 1, Diabetes History: 0, Stroke History: 0, Vascular Disease History: 1, Age Score: 0, Gender Score: 1].  Therefore, the patient's annual risk of stroke is 4.8 %.    Stroke ppx - 5mg  eliquis BID, appropriately dosed No bleeding concerns  #) HTN Much better control with recent addition of entresto 49-51 Continue 5mg  amlodipine BID, coreg as above Update BMP in 1-2 weeks with starting ARNI  #) COPD #) tobacco use Follows regularly with Dr. Belia Heman, has appt next week Encouraged  tobacco cessation        Current medicines are reviewed at length with the patient today.   The patient does not have concerns regarding her medicines.  The following changes were made today:  none  Labs/ tests ordered today include:  Orders Placed This Encounter  Procedures   Basic metabolic panel     Disposition: Follow up with Dr. Lalla Brothers or EP APP in 3 months, depending on procedure scheduled with Dr. Lalla Brothers  Msg sent to EP scheduler to schedule ablation procedure  Signed, Sheila Don, NP  05/06/23  3:02 PM  Electrophysiology [facility]

## 2023-05-11 ENCOUNTER — Ambulatory Visit: Payer: Medicaid Other | Admitting: Internal Medicine

## 2023-05-11 ENCOUNTER — Encounter: Payer: Self-pay | Admitting: Internal Medicine

## 2023-05-11 VITALS — BP 132/78 | HR 80 | Temp 97.8°F | Ht 62.0 in | Wt 158.0 lb

## 2023-05-11 DIAGNOSIS — G4733 Obstructive sleep apnea (adult) (pediatric): Secondary | ICD-10-CM | POA: Diagnosis not present

## 2023-05-11 DIAGNOSIS — F17210 Nicotine dependence, cigarettes, uncomplicated: Secondary | ICD-10-CM | POA: Diagnosis not present

## 2023-05-11 DIAGNOSIS — J449 Chronic obstructive pulmonary disease, unspecified: Secondary | ICD-10-CM

## 2023-05-11 DIAGNOSIS — Z720 Tobacco use: Secondary | ICD-10-CM

## 2023-05-11 MED ORDER — BREZTRI AEROSPHERE 160-9-4.8 MCG/ACT IN AERO
2.0000 | INHALATION_SPRAY | Freq: Two times a day (BID) | RESPIRATORY_TRACT | 8 refills | Status: DC
Start: 2023-05-11 — End: 2023-05-11

## 2023-05-11 MED ORDER — BREZTRI AEROSPHERE 160-9-4.8 MCG/ACT IN AERO: 2.0000 | INHALATION_SPRAY | Freq: Two times a day (BID) | RESPIRATORY_TRACT | Status: DC

## 2023-05-11 MED ORDER — BREZTRI AEROSPHERE 160-9-4.8 MCG/ACT IN AERO
2.0000 | INHALATION_SPRAY | Freq: Two times a day (BID) | RESPIRATORY_TRACT | 8 refills | Status: AC
Start: 2023-05-11 — End: ?

## 2023-05-11 NOTE — Patient Instructions (Addendum)
Diagnosis of OSA-START AUTO CPAP 4-8 cm h20  Patient can NOT tolerate ADVAIR, patient will need BREZTRI inhaler as this helped her the most   Please stop smoking   Surgical preop assessment Patient is a moderate to high risk for postop and Intra-Op complications due to her Lung disease  I have discussed that there is always a increased risk Pulmonary Infection, increased chance of Respiratory Failure and Cardiac Arrest, increased chance of pneumothorax and collapsed lung, as well as increased Stroke and Death. I have explained Risks to patient   At this time, Patient is at optimal medical management fi she start CPAP therapy and stops smoking Patient has MODERATE-HIGH for postop complications   General Risk Reduction Strategies: - All patients warrant post-operative incentive spirometry. For those with obstruction, also consider flutter valve. - Early ambulation, PT/OT - DVT prophylaxis where appropriate - Adequate pain control without oversedation    Follow up lung cancer screening program pending

## 2023-05-11 NOTE — Progress Notes (Signed)
[facility] [place] [place] Pulmonary Medicine Consultation      Date: 05/11/2023,   MRN# [id number] AVY BARLETT 1960-10-15     CHIEF COMPLAINT:  Follow-up assessment of COPD  Follow-up OSA     HISTORY OF PRESENT ILLNESS   Cardiac history 62 y.o. female with history of coronary arteries by LHC in 11/2022, PAF, atrial flutter, possible SVT, WCT, HFpEF, PAD and subclavian stenosis status post stenting in 2019, HTN, HLD, and COPD with tobacco use   admitted to [facility] in 11/2022 with A-fib with RVR and NSTEMI.  High-sensitivity troponin trended up to 1400.  She converted to sinus rhythm with IV Cardizem.   Echo showed an EF of 55 to 60%, no regional wall motion abnormalities, mild LVH, grade 2 diastolic dysfunction, normal RV systolic function and ventricular cavity size, and aortic valve sclerosis without evidence of stenosis.  LHC during the admission showed normal coronary arteries with mildly elevated LVEDP.  She was discharged on apixaban and carvedilol.  She presented to [facility] on 12/10/2022 with sudden onset of dizziness, diaphoresis, substernal chest pain, and palpitations.  Upon arrival she was tachycardic rates in the 230s bpm.  Initial rhythm showed WCT which appeared different from her prior A-fib.  Systolic blood pressure was 83 mmHg.  She underwent emergent DCCV with subsequent conversion to rapid A-fib and SVT, for which she received 300 mg of IV amiodarone push, and was started on amiodarone drip with maintenance of sinus rhythm.  It was also noted after cardioversion versus amiodarone bolus that she had junctional bradycardia with rates in the 30s bpm as well.  She was subsequently transferred to [facility] where she remained in sinus rhythm and was evaluated by EP with initial rhythm felt to be atrial flutter versus SVT with aberrancy.  EP recommended continuing amiodarone and apixaban with favoring of EP study and catheter ablation of A-fib, flutter, and possible SVT in the outpatient  setting.  Pulmonary history Patient does have a productive cough signs and symptoms of COPD exacerbation Patient was told she was diagnosed with COPD several years ago She currently uses tobacco 1 pack a day for the last 30 years  Patient has an abnormal CT chest several years ago in 2019 with a left lower lobe lung nodule Patient is a high risk for lung cancer recommend lung cancer screening program  PLAN FOR PROCEDURE BY CARDIOLOGY   Surgical preop assessment Patient is a moderate to high risk for postop and Intra-Op complications due to her Lung disease  I have discussed that there is always a increased risk Pulmonary Infection, increased chance of Respiratory Failure and Cardiac Arrest, increased chance of pneumothorax and collapsed lung, as well as increased Stroke and Death. I have explained Risks to patient  General Risk Reduction Strategies: - All patients warrant post-operative incentive spirometry. For those with obstruction, also consider flutter valve. - Early ambulation, PT/OT - DVT prophylaxis where appropriate - Adequate pain control without oversedation    Follow-up OSA Sleep studies consistent with mild OSA with AHI of approximately 10 Plan is to start Auto CPAP therapy   Smoking Assessment and Cessation Counseling Upon further questioning, Patient smokes 1 PPD I have advised patient to quit/stop smoking as soon as possible due to high risk for multiple medical problems  Patientis NOT willing to quit smoking  I have advised patient that we can assist and have options of Nicotine replacement therapy. I also advised patient on behavioral therapy and can provide oral medication therapy in conjunction with  the other therapies Follow up next Office visit  for assessment of smoking cessation Smoking cessation counseling advised for 4 minutes      PAST MEDICAL HISTORY   Past Medical History:  Diagnosis Date   Aorto-iliac atherosclerosis (HCC) 01/06/2016    Asthma    COPD (chronic obstructive pulmonary disease) (HCC)    Hypertension    Osteoporosis    Tobacco abuse 01/06/2016   Vitamin D deficiency 01/06/2016     SURGICAL HISTORY   Past Surgical History:  Procedure Laterality Date   ANKLE FRACTURE SURGERY Left 11/07/2009   left ankle hardware removal     left ankle surgery     LEFT HEART CATH AND CORONARY ANGIOGRAPHY N/A 11/29/2022   Procedure: LEFT HEART CATH AND CORONARY ANGIOGRAPHY;  Surgeon: Iran Ouch, MD;  Location: [facility] INVASIVE CV LAB;  Service: Cardiovascular;  Laterality: N/A;   LOWER EXTREMITY ANGIOGRAPHY Left 10/17/2017   Procedure: LOWER EXTREMITY ANGIOGRAPHY;  Surgeon: Annice Needy, MD;  Location: [facility] INVASIVE CV LAB;  Service: Cardiovascular;  Laterality: Left;   TUBAL LIGATION       FAMILY HISTORY   Family History  Problem Relation Age of Onset   COPD Mother    Heart failure Mother    COPD Father    Heart failure Father      SOCIAL HISTORY   Social History   Tobacco Use   Smoking status: Every Day    Current packs/day: 1.00    Average packs/day: 1 pack/day for 30.0 years (30.0 ttl pk-yrs)    Types: Cigarettes   Smokeless tobacco: Never   Tobacco comments:    1 PPD - 01/24/2023 khj  Vaping Use   Vaping status: Never Used  Substance Use Topics   Alcohol use: No    Alcohol/week: 0.0 standard drinks of alcohol   Drug use: No     MEDICATIONS    Home Medication:  Current Outpatient Rx   Order #: [id number] Class: Historical Med   Order #: [id number] Class: Historical Med   Order #: [id number] Class: Historical Med   Order #: [id number] Class: Normal   Order #: [id number] Class: Normal   Order #: [id number] Class: Normal   Order #: [id number] Class: Normal   Order #: [id number] Class: Normal   Order #: [id number] Class: Normal   Order #: [id number] Class: Normal   Order #: [id number] Class: Historical Med   Order #: [id number] Class: Historical Med   Order #: [id number] Class: Normal   Order #: [id number] Class:  Normal   Order #: [id number] Class: Sample   Order #: [id number] Class: Historical Med    Current Medication:  Current Outpatient Medications:    acetaminophen (TYLENOL) 500 MG tablet, Take 500-1,000 mg by mouth every 6 (six) hours as needed for mild pain or moderate pain., Disp: , Rfl:    albuterol (PROVENTIL) (2.5 MG/3ML) 0.083% nebulizer solution, Take 2.5 mg by nebulization 3 (three) times daily., Disp: , Rfl:    ALPRAZolam (XANAX) 0.5 MG tablet, Take 0.5 mg by mouth 4 (four) times daily as needed for anxiety., Disp: , Rfl:    amiodarone (PACERONE) 200 MG tablet, Table 2 tablets twice a day for 5 days, then 2 tablets daily for 5 days, then 1 tablet daily thereafter, Disp: 60 tablet, Rfl: 2   amLODipine (NORVASC) 5 MG tablet, Take 1 tablet (5 mg total) by mouth in the morning and at bedtime., Disp: 180 tablet, Rfl: 3   apixaban (ELIQUIS) 5 MG TABS tablet, Take 1 tablet (5 mg total) by mouth 2 (two) times daily., Disp: 60  tablet, Rfl: 3   aspirin EC 81 MG tablet, Take 1 tablet (81 mg total) by mouth daily. Swallow whole., Disp: 30 tablet, Rfl: 12   atorvastatin (LIPITOR) 40 MG tablet, Take 1 tablet (40 mg total) by mouth at bedtime., Disp: 30 tablet, Rfl: 1   carvedilol (COREG) 6.25 MG tablet, Take 1 tablet (6.25 mg total) by mouth 2 (two) times daily., Disp: 180 tablet, Rfl: 3   fluticasone-salmeterol (ADVAIR DISKUS) 500-50 MCG/ACT AEPB, Inhale 1 puff into the lungs in the morning and at bedtime., Disp: 60 each, Rfl: 6   omeprazole (PRILOSEC) 40 MG capsule, Take 40 mg by mouth daily., Disp: , Rfl:    oxyCODONE-acetaminophen (PERCOCET) 7.5-325 MG tablet, Take 1 tablet by mouth every 8 (eight) hours as needed for severe pain., Disp: , Rfl:    potassium chloride SA (KLOR-CON M) 20 MEQ tablet, Take 1 tablet (20 mEq total) by mouth 2 (two) times daily., Disp: 90 tablet, Rfl: 3   predniSONE (DELTASONE) 20 MG tablet, Take 1 tablet (20 mg total) by mouth daily with breakfast. 10 days (Patient not taking:  Reported on 04/27/2023), Disp: 10 tablet, Rfl: 1   sacubitril-valsartan (ENTRESTO) 49-51 MG, Take 1 tablet by mouth 2 (two) times daily., Disp: , Rfl:    YUPELRI 175 MCG/3ML nebulizer solution, Take 175 mcg by nebulization daily. (Patient not taking: Reported on 04/27/2023), Disp: , Rfl:     ALLERGIES   Peach flavor, Vicodin [hydrocodone-acetaminophen], Vicodin [hydrocodone-acetaminophen], Hct [hydrochlorothiazide], and Tramadol   BP 132/78 (BP Location: Right Arm, Cuff Size: Normal)   Pulse 80   Temp 97.8 F (36.6 C) (Oral)   Ht 5\' 2"  (1.575 m)   Wt 158 lb (71.7 kg)   LMP 08/09/2008 (Within Years)   SpO2 97%   BMI 28.90 kg/m     Review of Systems: Gen:  Denies  fever, sweats, chills weight loss  HEENT: Denies blurred vision, double vision, ear pain, eye pain, hearing loss, nose bleeds, sore throat Cardiac:  No dizziness, chest pain or heaviness, chest tightness,edema, No JVD Resp:   No cough, -sputum production, +shortness of breath,-wheezing, -hemoptysis,  Other:  All other systems negative   Physical Examination:   General Appearance: No distress  EYES PERRLA, EOM intact.   NECK Supple, No JVD Pulmonary: normal breath sounds, No wheezing.  CardiovascularNormal S1,S2.  No m/r/g.   Abdomen: Benign, Soft, non-tender. Neurology UE/LE 5/5 strength, no focal deficits Ext pulses intact, cap refill intact ALL OTHER ROS ARE NEGATIVE     IMAGING    CT chest independently reviewed today from 2019 Bilateral groundglass opacifications mosaic pattern with areas of emphysema with a left lower lobe lung nodule  LEFT HEART CATH APRIL 2024 1.  Normal coronary arteries. 2.  Left ventricular angiography was not performed.  EF was normal by echo. 3.  Severely elevated systolic blood pressure with mildly elevated left ventricular end-diastolic pressure.     ASSESSMENT/PLAN   62 year old pleasant white female seen today for ongoing issues with arrhythmias with several hospital  admissions including non-STEMI with ongoing tobacco abuse with COPD and COPD exacerbation at this time with chronic cough and sputum production most likely related to chronic bronchitis In the setting of obesity and deconditioned state with underlying signs symptoms of excessive daytime sleepiness snoring and witnessed apneas consistent with sleep apnea   COPD Moderate to severe PATIENT DID NOT TOLERATE ADVAIR THERAPY SHE WILL NEED TO Continue Breztri as prescribed Continue albuterol as prescribed COPD moderate to severe obstructive  lung disease based on pulmonary function test FEV1 57% predicted    Tobacco abuse Smoking cessation strongly advised referral to lung cancer screening program pending CT of the chest reviewed with patient left lower lobe lung nodule persistent Areas of emphysema and mosaic pattern    Diagnosis of mild OSA Start auto CPAP 4 to 8 cm water pressure   Surgical preop assessment Patient is a moderate to high risk for postop and Intra-Op complications due to her Lung disease  I have discussed that there is always a increased risk Pulmonary Infection, increased chance of Respiratory Failure and Cardiac Arrest, increased chance of pneumothorax and collapsed lung, as well as increased Stroke and Death. I have explained Risks to patient   At this time, Patient is at optimal medical management fi she start CPAP therapy and stops smoking Patient has MODERATE-HIGH for postop complications     MEDICATION ADJUSTMENTS/LABS AND TESTS ORDERED: Restart Breztri 2 puffs in the morning and 2 puffs at night Recommend rinsing your mouth out after every use Please stop smoking Pending lung cancer screening program Start auto CPAP 4-8 Avoid secondhand smoke Avoid SICK contacts Recommend  Masking  when appropriate Recommend Keep up-to-date with vaccinations   CURRENT MEDICATIONS REVIEWED AT LENGTH WITH PATIENT TODAY   Patient  satisfied with Plan of action and  management. All questions answered  Follow up 6 months  Total Time Spent 48 minutes   Lucie Leather, M.D.  [facility]  Medical Director [facility] Medical Director [facility] Cardio-Pulmonary Department

## 2023-05-18 ENCOUNTER — Other Ambulatory Visit (HOSPITAL_COMMUNITY): Payer: Self-pay

## 2023-05-18 ENCOUNTER — Telehealth: Payer: Self-pay

## 2023-05-18 NOTE — Telephone Encounter (Signed)
*  Pulm  Pharmacy Patient Advocate Encounter  Received notification from [facility] that Prior Authorization for [facility] Aerosphere 160-9-4.8MCG/ACT aerosol  has been APPROVED from 05/18/2023 to 05/16/2024. Ran test claim, Copay is $4.00. This test claim was processed through [facility]- copay amounts may vary at other pharmacies due to pharmacy/plan contracts, or as the patient moves through the different stages of their insurance plan.   PA #/Case ID/Reference #: [id number]

## 2023-05-27 ENCOUNTER — Ambulatory Visit: Payer: Medicaid Other | Admitting: Physician Assistant

## 2023-05-31 ENCOUNTER — Other Ambulatory Visit: Payer: Self-pay

## 2023-05-31 ENCOUNTER — Emergency Department: Payer: Medicaid Other

## 2023-05-31 ENCOUNTER — Emergency Department
Admission: EM | Admit: 2023-05-31 | Discharge: 2023-05-31 | Disposition: A | Discharge status: Home / Self Care | Payer: Medicaid Other | Attending: Emergency Medicine | Admitting: Emergency Medicine

## 2023-05-31 DIAGNOSIS — I10 Essential (primary) hypertension: Secondary | ICD-10-CM | POA: Insufficient documentation

## 2023-05-31 DIAGNOSIS — R109 Unspecified abdominal pain: Secondary | ICD-10-CM | POA: Diagnosis present

## 2023-05-31 DIAGNOSIS — J449 Chronic obstructive pulmonary disease, unspecified: Secondary | ICD-10-CM | POA: Diagnosis not present

## 2023-05-31 DIAGNOSIS — B9689 Other specified bacterial agents as the cause of diseases classified elsewhere: Secondary | ICD-10-CM | POA: Diagnosis not present

## 2023-05-31 DIAGNOSIS — N390 Urinary tract infection, site not specified: Secondary | ICD-10-CM | POA: Diagnosis not present

## 2023-05-31 LAB — CBC
HCT: 44.1 % (ref 36.0–46.0)
Hemoglobin: 14.3 g/dL (ref 12.0–15.0)
MCH: 31.4 pg (ref 26.0–34.0)
MCHC: 32.4 g/dL (ref 30.0–36.0)
MCV: 96.9 fL (ref 80.0–100.0)
Platelets: 382 10*3/uL (ref 150–400)
RBC: 4.55 MIL/uL (ref 3.87–5.11)
RDW: 14.9 % (ref 11.5–15.5)
WBC: 14.3 10*3/uL — ABNORMAL HIGH (ref 4.0–10.5)
nRBC: 0 % (ref 0.0–0.2)

## 2023-05-31 LAB — COMPREHENSIVE METABOLIC PANEL
ALT: 24 U/L (ref 0–44)
AST: 24 U/L (ref 15–41)
Albumin: 3.9 g/dL (ref 3.5–5.0)
Alkaline Phosphatase: 79 U/L (ref 38–126)
Anion gap: 9 (ref 5–15)
BUN: 10 mg/dL (ref 8–23)
CO2: 21 mmol/L — ABNORMAL LOW (ref 22–32)
Calcium: 9.7 mg/dL (ref 8.9–10.3)
Chloride: 106 mmol/L (ref 98–111)
Creatinine, Ser: 0.71 mg/dL (ref 0.44–1.00)
GFR, Estimated: >60 mL/min (ref >60)
Glucose, Bld: 91 mg/dL (ref 70–99)
Potassium: 4.2 mmol/L (ref 3.5–5.1)
Sodium: 136 mmol/L (ref 135–145)
Total Bilirubin: 0.4 mg/dL (ref 0.3–1.2)
Total Protein: 7.3 g/dL (ref 6.5–8.1)

## 2023-05-31 LAB — URINALYSIS, ROUTINE W REFLEX MICROSCOPIC
Bacteria, UA: NONE SEEN
Bilirubin Urine: NEGATIVE
Glucose, UA: NEGATIVE mg/dL
Ketones, ur: NEGATIVE mg/dL
Leukocytes,Ua: NEGATIVE
Nitrite: NEGATIVE
Protein, ur: NEGATIVE mg/dL
Specific Gravity, Urine: 1.005 (ref 1.005–1.030)
pH: 5 (ref 5.0–8.0)

## 2023-05-31 MED ORDER — CEFTRIAXONE SODIUM 1 G IJ SOLR: 500.0000 mg | Freq: Once | INTRAMUSCULAR | Status: DC

## 2023-05-31 MED ORDER — CEPHALEXIN 500 MG PO CAPS: 500.0000 mg | ORAL_CAPSULE | Freq: Three times a day (TID) | ORAL | 0 refills | Status: AC

## 2023-05-31 MED ORDER — IOHEXOL 300 MG/ML  SOLN
100.0000 mL | Freq: Once | INTRAMUSCULAR | Status: AC | PRN
  Administered 2023-05-31: 100 mL via INTRAVENOUS

## 2023-05-31 MED ORDER — SODIUM CHLORIDE 0.9 % IV SOLN
1.0000 g | Freq: Once | INTRAVENOUS | Status: AC
  Administered 2023-05-31: 1 g via INTRAVENOUS
  Filled 2023-05-31: qty 10

## 2023-05-31 MED ORDER — LIDOCAINE HCL (PF) 1 % IJ SOLN
5.0000 mL | Freq: Once | INTRAMUSCULAR | Status: DC
  Filled 2023-05-31: qty 5

## 2023-05-31 MED ORDER — IOHEXOL 350 MG/ML SOLN: 100.0000 mL | Freq: Once | INTRAVENOUS | Status: DC | PRN

## 2023-05-31 MED ORDER — KETOROLAC TROMETHAMINE 30 MG/ML IJ SOLN
30.0000 mg | Freq: Once | INTRAMUSCULAR | Status: AC
  Administered 2023-05-31: 30 mg via INTRAVENOUS
  Filled 2023-05-31: qty 1

## 2023-05-31 NOTE — ED Triage Notes (Signed)
Pt to ED for right flank pain x2 weeks with urinary sx. Reports on antibiotics for pyelonephritis.

## 2023-05-31 NOTE — ED Provider Triage Note (Signed)
Emergency Medicine Provider Triage Evaluation Note  Sheila [name] , a 62 y.o. female  was evaluated in triage.  Pt complains of pyelonephritis, nausea, fever, chills, vomiting.  Sent by her doctor for CT.  Review of Systems  Positive:  Negative:   Physical Exam  BP 134/80   Pulse 68   Temp 97.7 F (36.5 C)   Resp 18   Ht 5\' 2"  (1.575 m)   Wt 71.2 kg   LMP 08/09/2008 (Within Years)   SpO2 100%   BMI 28.72 kg/m  Gen:   Awake, no distress   Resp:  Normal effort  MSK:   Moves extremities without difficulty  Other:    Medical Decision Making  Medically screening exam initiated at 1:12 PM.  Appropriate orders placed.  Sheila [name] was informed that the remainder of the evaluation will be completed by another provider, this initial triage assessment does not replace that evaluation, and the importance of remaining in the ED until their evaluation is complete.  Patient already taking antibiotics   Faythe Ghee, PA-C 05/31/23 1313

## 2023-05-31 NOTE — ED Notes (Signed)
Pt states daughter enroute and will pick pt up at 2000 even if CT result is not back yet. Provider aware

## 2023-05-31 NOTE — ED Notes (Signed)
Assumed care of pt at this time. Pt is AAXO4, pt asking about status of cat scan results. Informed pt ct results are not back yet. Pt stated she has not eaten since this morning and has been here all day. Pt requesting to be discharged, go home, and be informed when CT results are back.

## 2023-05-31 NOTE — ED Notes (Signed)
Provided pt with discharge instructions and education. All of pt questions answered. Pt in possession of all belongings. Pt AAOX4 and stable at time of discharge.Pt ambulated w/ steady gait towards ED exit. Pt accompanied by family member.

## 2023-05-31 NOTE — Discharge Instructions (Addendum)
Your CT does not reveal any infection in the kidneys.  Labs otherwise normal.  Urine culture is pending to help guide treatment.  Take antibiotic as prescribed.  Follow-up with your primary provider for ongoing concerns.

## 2023-06-01 LAB — URINE CULTURE
Culture: <10000 — AB
Special Requests: NORMAL

## 2023-06-08 ENCOUNTER — Telehealth: Payer: Self-pay | Admitting: Cardiology

## 2023-06-08 ENCOUNTER — Other Ambulatory Visit: Payer: Self-pay

## 2023-06-08 MED ORDER — AMIODARONE HCL 200 MG PO TABS: 200.0000 mg | ORAL_TABLET | Freq: Every day | ORAL | 2 refills | Status: DC

## 2023-06-08 NOTE — Telephone Encounter (Signed)
Disp Refills Start End   amiodarone (PACERONE) 200 MG tablet 90 tablet 2 06/08/2023 --   Sig - Route: Take 1 tablet (200 mg total) by mouth daily. - Oral   Sent to pharmacy as: amiodarone (PACERONE) 200 MG tablet   E-Prescribing Status: Receipt confirmed by pharmacy (06/08/2023 12:51 PM EDT)    Pharmacy  [facility] - [place], [place] - [address]

## 2023-06-08 NOTE — Telephone Encounter (Signed)
*  STAT* If patient is at the pharmacy, call can be transferred to refill team.   1. Which medications need to be refilled? (please list name of each medication and dose if known) 30 Amiodarone HCI 200 MG Tab generic for Cordorone   2. Would you like to learn more about the convenience, safety, & potential cost savings by using the [facility]? no     3. Are you open to using the [facility]. no   4. Which pharmacy/location (including street and city if local pharmacy) is medication to be sent to?[facility] [address] [place] [phone]   5. Do they need a 30 day or 90 day supply? 90

## 2023-06-10 NOTE — ED Provider Notes (Signed)
[facility] Emergency Department Provider Note     Event Date/Time   First MD Initiated Contact with Patient 05/31/23 1512     (approximate)   History   Flank Pain   HPI  Sheila [name] is a 62 y.o. female with a history of HTN, COPD, recurrent UTI presents to the ED for evaluation of right flank pain intimately for the last 2 weeks.  She denies any dysuria, hematuria, urinary urgency, or retention.  She has previously been treated for pyelonephritis by her primary provider but denies any benefit.  She denies any fevers, chills, sweats, nausea, vomiting, dizziness.  Physical Exam   Triage Vital Signs: ED Triage Vitals  Encounter Vitals Group     BP 05/31/23 1310 134/80     Systolic BP Percentile --      Diastolic BP Percentile --      Pulse Rate 05/31/23 1310 68     Resp 05/31/23 1310 18     Temp 05/31/23 1308 97.7 F (36.5 C)     Temp Source 05/31/23 2014 Oral     SpO2 05/31/23 1310 100 %     Weight 05/31/23 1308 157 lb (71.2 kg)     Height 05/31/23 1308 5\' 2"  (1.575 m)     Head Circumference --      Peak Flow --      Pain Score 05/31/23 1308 8     Pain Loc --      Pain Education --      Exclude from Growth Chart --     Most recent vital signs: Vitals:   05/31/23 1310 05/31/23 2014  BP: 134/80 128/74  Pulse: 68 72  Resp: 18 16  Temp:  97.9 F (36.6 C)  SpO2: 100% 100%    General Awake, no distress. ND HEENT NCAT. PERRL. EOMI. No rhinorrhea. Mucous membranes are moist.  CV:  Good peripheral perfusion. RRR RESP:  Normal effort. CTA ABD:  No distention.  Soft and nontender.  Mild right flank pain on palpation.  Normal bowel sounds appreciated.   ED Results / Procedures / Treatments   Labs (all labs ordered are listed, but only abnormal results are displayed) Labs Reviewed  URINE CULTURE - Abnormal; Notable for the following components:      Result Value   Culture   (*)    Value: <10,000 COLONIES/mL INSIGNIFICANT  GROWTH Performed at [facility], [address], [place], [place] [address]    All other components within normal limits  CBC - Abnormal; Notable for the following components:   WBC 14.3 (*)    All other components within normal limits  COMPREHENSIVE METABOLIC PANEL - Abnormal; Notable for the following components:   CO2 21 (*)    All other components within normal limits  URINALYSIS, ROUTINE W REFLEX MICROSCOPIC - Abnormal; Notable for the following components:   Color, Urine STRAW (*)    APPearance CLEAR (*)    Hgb urine dipstick MODERATE (*)    All other components within normal limits     EKG   RADIOLOGY  I personally viewed and evaluated these images as part of my medical decision making, as well as reviewing the written report by the radiologist.  ED Provider Interpretation: No evidence of nephrolithiasis or pyelonephritis  CT ABD / Pelvis w/ CM  IMPRESSION: 1. No findings of pyelonephritis or obstructive uropathy. There may be slight enhancement of the right ureter, can be seen with ascending urinary  tract infection. 2. Colonic diverticulosis without diverticulitis. 3. Small hiatal hernia.   Aortic Atherosclerosis (ICD10-I70.0)  PROCEDURES:  Critical Care performed: No  Procedures   MEDICATIONS ORDERED IN ED: Medications  iohexol (OMNIPAQUE) 300 MG/ML solution 100 mL (100 mLs Intravenous Contrast Given 05/31/23 1655)  ketorolac (TORADOL) 30 MG/ML injection 30 mg (30 mg Intravenous Given 05/31/23 1904)  cefTRIAXone (ROCEPHIN) 1 g in sodium chloride 0.9 % 100 mL IVPB (0 g Intravenous Stopped 05/31/23 2000)     IMPRESSION / MDM / ASSESSMENT AND PLAN / ED COURSE  I reviewed the triage vital signs and the nursing notes.                              Differential diagnosis includes, but is not limited to, acute appendicitis, diverticulitis, urinary tract infection/pyelonephritis, endometriosis, bowel obstruction, colitis, renal colic, gastroenteritis,  hernia, etc.   Patient's presentation is most consistent with acute complicated illness / injury requiring diagnostic workup.  Patient's diagnosis is consistent with dysuria and flank pain.  UA and labs are reassuring with exception of a mild elevated white blood cell count which is nonspecific at this point.  Urine culture will be pending to help guide treatment.  Patient be treated empirically with Keflex.  Patient is otherwise stable for outpatient management.  Will follow-up with primary provider as discussed or return to the ED if necessary.  Patient is given ED precautions to return to the ED for any worsening or new symptoms.   FINAL CLINICAL IMPRESSION(S) / ED DIAGNOSES   Final diagnoses:  Flank pain  Lower urinary tract infectious disease     Rx / DC Orders   ED Discharge Orders          Ordered    cephALEXin (KEFLEX) 500 MG capsule  3 times daily        05/31/23 2012             Note:  This document was prepared using Dragon voice recognition software and may include unintentional dictation errors.    Lissa Hoard, PA-C 06/10/23 1552    Minna Antis, MD 06/12/23 1052

## 2023-06-21 NOTE — Progress Notes (Unsigned)
Electrophysiology Office Follow up Visit Note:    Date:  06/22/2023   ID:  [name], DOB 1960-08-26, MRN [id number]  PCP:  Alease Medina, MD  [facility] Cardiologist:  Julien Nordmann, MD  [facility] Electrophysiologist:  Lanier Prude, MD    Interval History:     Sheila [name] is a 62 y.o. female who presents for a follow up visit.   Discussed the use of AI scribe software for clinical note transcription with the patient, who gave verbal consent to proceed.  History of Present Illness   Sheila [name], a 62 year old with a history of atrial fibrillation and flutter, SVT, hypertension, hyperlipidemia, and COPD, presents for follow-up. She is currently on Eliquis for stroke prophylaxis and amiodarone. She was previously deemed not a candidate for catheter ablation due to poor respiratory status. However, she reports an improved respiratory status since the last appointment. Despite this improvement, she continues to smoke, albeit at a reduced rate of half a pack a day. She experiences palpitations, sometimes multiple times a day, and sometimes going a week without any. She has been maintaining sinus rhythm as of an EKG on August 30th, 2024.            Past medical, surgical, social and family history were reviewed.  ROS:   Please see the history of present illness.    All other systems reviewed and are negative.  EKGs/Labs/Other Studies Reviewed:    The following studies were reviewed today:       Physical Exam:    VS:  BP 136/78 (BP Location: Left Arm, Patient Position: Sitting, Cuff Size: Large)   Pulse 62   Ht 5\' 2"  (1.575 m)   Wt 155 lb (70.3 kg)   LMP 08/09/2008 (Within Years)   SpO2 98%   BMI 28.35 kg/m     Wt Readings from Last 3 Encounters:  06/22/23 155 lb (70.3 kg)  05/31/23 157 lb (71.2 kg)  05/11/23 158 lb (71.7 kg)     Physical Exam   GEN: No distress.  CARD: RRR, no MRG RESP: No IWOB. No Wheezing. Good air movement.           ASSESSMENT:    1. Atrial flutter, unspecified type (HCC)   2. Wide-complex tachycardia   3. Chronic heart failure with preserved ejection fraction (HCC)    PLAN:    In order of problems listed above:  Assessment and Plan    Atrial Fibrillation/Flutter and SVT Maintaining sinus rhythm on amiodarone and Eliquis. Discussed the risks/benefits of catheter ablation given her moderate risk for postop or intraop complications due to reduced lung function. Patient is interested in pursuing ablation to manage arrhythmia and potentially discontinue amiodarone due to its lung side effects. -Schedule catheter ablation procedure. -Continue Eliquis and amiodarone until further notice.  COPD Improved respiratory status reported. Patient is still smoking, albeit reduced to half a pack a day. -Encourage further reduction in smoking, aiming for a quarter pack a day. -Notify team if there is a change in breathing status.  Hyperlipidemia and Hypertension No changes or concerns discussed in the conversation. -Continue current management.  General Health Maintenance / Followup Plans -Follow up after catheter ablation procedure to assess recovery and arrhythmia management.         Discussed treatment options today for AF including antiarrhythmic drug therapy and ablation. Discussed risks, recovery and likelihood of success with each treatment strategy. Risk, benefits, and alternatives to EP study and ablation for afib  were discussed. These risks include but are not limited to stroke, bleeding, vascular damage, tamponade, perforation, damage to the esophagus, lungs, phrenic nerve and other structures, pulmonary vein stenosis, worsening renal function, coronary vasospasm and death.  Discussed potential need for repeat ablation procedures and antiarrhythmic drugs after an initial ablation. The patient understands these risk and wishes to proceed.  We will therefore proceed with catheter ablation at the  next available time.  Carto, ICE, anesthesia are requested for the procedure.  Will also obtain CT PV protocol prior to the procedure to exclude LAA thrombus and further evaluate atrial anatomy.       Signed, Steffanie Dunn, MD, [facility], [facility] 06/22/2023 3:05 PM    Electrophysiology [facility]

## 2023-06-22 ENCOUNTER — Ambulatory Visit: Payer: Medicaid Other | Attending: Cardiology | Admitting: Cardiology

## 2023-06-22 ENCOUNTER — Encounter: Payer: Self-pay | Admitting: Cardiology

## 2023-06-22 VITALS — BP 136/78 | HR 62 | Ht 62.0 in | Wt 155.0 lb

## 2023-06-22 DIAGNOSIS — R000 Tachycardia, unspecified: Secondary | ICD-10-CM | POA: Diagnosis not present

## 2023-06-22 DIAGNOSIS — I4892 Unspecified atrial flutter: Secondary | ICD-10-CM | POA: Diagnosis not present

## 2023-06-22 DIAGNOSIS — I5032 Chronic diastolic (congestive) heart failure: Secondary | ICD-10-CM | POA: Diagnosis not present

## 2023-06-22 NOTE — Patient Instructions (Signed)
Medication Instructions:  Your physician recommends that you continue on your current medications as directed. Please refer to the Current Medication list given to you today.  *If you need a refill on your cardiac medications before your next appointment, please call your pharmacy*   Lab Work: Your provider would like for you to return the week of January 6th to have the following labs drawn: BMET and CBC.   Please go to [facility] [address] [address] [facility], [place] [address] You do not need an appointment.  They are open from 7:30 am-4 pm.  Lunch from 1:00 pm- 2:00 pm You do NOT need to be fasting.   Testing/Procedures: CT Scan Your physician has requested that you have cardiac CT. Cardiac computed tomography (CT) is a painless test that uses an x-ray machine to take clear, detailed pictures of your heart. For further information please visit [email or web address] We will call you to schedule your CT scan. It will be done about 2-3 weeks prior to your ablation.  Ablation Your physician has recommended that you have an ablation. Catheter ablation is a medical procedure used to treat some cardiac arrhythmias (irregular heartbeats). During catheter ablation, a long, thin, flexible tube is put into a blood vessel in your groin (upper thigh), or neck. This tube is called an ablation catheter. It is then guided to your heart through the blood vessel. Radio frequency waves destroy small areas of heart tissue where abnormal heartbeats may cause an arrhythmia to start. You are scheduled for Atrial Fibrillation Ablation on Monday, February 3 with Dr. Steffanie Dunn.Please arrive at the Main Entrance A at [facility]: [address] [place], [place] [address] at 8:30 AM    Follow-Up: At [facility], you and your health needs are our priority.  As part of our continuing mission to provide you with exceptional heart care, we have created designated  Provider Care Teams.  These Care Teams include your primary Cardiologist (physician) and Advanced Practice Providers (APPs -  Physician Assistants and Nurse Practitioners) who all work together to provide you with the care you need, when you need it.  Your next appointment:   We will call you to arrange your follow up appointments.

## 2023-06-22 NOTE — Addendum Note (Signed)
Addended by: Frutoso Schatz on: 06/22/2023 03:10 PM   Modules accepted: Orders

## 2023-08-08 ENCOUNTER — Ambulatory Visit: Payer: Medicaid Other | Admitting: Cardiology

## 2023-08-11 ENCOUNTER — Emergency Department
Admission: EM | Admit: 2023-08-11 | Discharge: 2023-08-11 | Disposition: A | Discharge status: Home / Self Care | Payer: Medicaid Other | Attending: Emergency Medicine | Admitting: Emergency Medicine

## 2023-08-11 ENCOUNTER — Encounter: Payer: Self-pay | Admitting: Emergency Medicine

## 2023-08-11 ENCOUNTER — Other Ambulatory Visit: Payer: Self-pay

## 2023-08-11 ENCOUNTER — Emergency Department: Payer: Medicaid Other

## 2023-08-11 DIAGNOSIS — J45909 Unspecified asthma, uncomplicated: Secondary | ICD-10-CM | POA: Insufficient documentation

## 2023-08-11 DIAGNOSIS — F17200 Nicotine dependence, unspecified, uncomplicated: Secondary | ICD-10-CM | POA: Insufficient documentation

## 2023-08-11 DIAGNOSIS — I10 Essential (primary) hypertension: Secondary | ICD-10-CM | POA: Insufficient documentation

## 2023-08-11 DIAGNOSIS — K029 Dental caries, unspecified: Secondary | ICD-10-CM | POA: Diagnosis not present

## 2023-08-11 DIAGNOSIS — K122 Cellulitis and abscess of mouth: Secondary | ICD-10-CM

## 2023-08-11 DIAGNOSIS — J449 Chronic obstructive pulmonary disease, unspecified: Secondary | ICD-10-CM | POA: Diagnosis not present

## 2023-08-11 DIAGNOSIS — R220 Localized swelling, mass and lump, head: Secondary | ICD-10-CM | POA: Diagnosis present

## 2023-08-11 DIAGNOSIS — L03211 Cellulitis of face: Secondary | ICD-10-CM

## 2023-08-11 LAB — COMPREHENSIVE METABOLIC PANEL
ALT: 11 U/L (ref 0–44)
AST: 13 U/L — ABNORMAL LOW (ref 15–41)
Albumin: 3.5 g/dL (ref 3.5–5.0)
Alkaline Phosphatase: 64 U/L (ref 38–126)
Anion gap: 12 (ref 5–15)
BUN: 15 mg/dL (ref 8–23)
CO2: 18 mmol/L — ABNORMAL LOW (ref 22–32)
Calcium: 9.4 mg/dL (ref 8.9–10.3)
Chloride: 102 mmol/L (ref 98–111)
Creatinine, Ser: 1.03 mg/dL — ABNORMAL HIGH (ref 0.44–1.00)
GFR, Estimated: >60 mL/min (ref >60)
Glucose, Bld: 98 mg/dL (ref 70–99)
Potassium: 4.3 mmol/L (ref 3.5–5.1)
Sodium: 132 mmol/L — ABNORMAL LOW (ref 135–145)
Total Bilirubin: 0.6 mg/dL (ref 0.0–1.2)
Total Protein: 7.2 g/dL (ref 6.5–8.1)

## 2023-08-11 LAB — CBC WITH DIFFERENTIAL/PLATELET
Abs Immature Granulocytes: 0.09 10*3/uL — ABNORMAL HIGH (ref 0.00–0.07)
Basophils Absolute: 0.1 10*3/uL (ref 0.0–0.1)
Basophils Relative: 0 %
Eosinophils Absolute: 0 10*3/uL (ref 0.0–0.5)
Eosinophils Relative: 0 %
HCT: 44.4 % (ref 36.0–46.0)
Hemoglobin: 14.1 g/dL (ref 12.0–15.0)
Immature Granulocytes: 1 %
Lymphocytes Relative: 12 %
Lymphs Abs: 2.2 10*3/uL (ref 0.7–4.0)
MCH: 31.8 pg (ref 26.0–34.0)
MCHC: 31.8 g/dL (ref 30.0–36.0)
MCV: 100.2 fL — ABNORMAL HIGH (ref 80.0–100.0)
Monocytes Absolute: 1.6 10*3/uL — ABNORMAL HIGH (ref 0.1–1.0)
Monocytes Relative: 9 %
Neutro Abs: 14.3 10*3/uL — ABNORMAL HIGH (ref 1.7–7.7)
Neutrophils Relative %: 78 %
Platelets: 353 10*3/uL (ref 150–400)
RBC: 4.43 MIL/uL (ref 3.87–5.11)
RDW: 13.3 % (ref 11.5–15.5)
WBC: 18.2 10*3/uL — ABNORMAL HIGH (ref 4.0–10.5)
nRBC: 0 % (ref 0.0–0.2)

## 2023-08-11 LAB — LACTIC ACID, PLASMA
Lactic Acid, Venous: 1.2 mmol/L (ref 0.5–1.9)
Lactic Acid, Venous: 1.3 mmol/L (ref 0.5–1.9)

## 2023-08-11 MED ORDER — IOHEXOL 300 MG/ML  SOLN
100.0000 mL | Freq: Once | INTRAMUSCULAR | Status: AC | PRN
  Administered 2023-08-11: 100 mL via INTRAVENOUS

## 2023-08-11 MED ORDER — OXYCODONE-ACETAMINOPHEN 5-325 MG PO TABS: 1.0000 | ORAL_TABLET | Freq: Four times a day (QID) | ORAL | 0 refills | Status: DC | PRN

## 2023-08-11 MED ORDER — SODIUM CHLORIDE 0.9 % IV SOLN
3.0000 g | Freq: Once | INTRAVENOUS | Status: AC
  Administered 2023-08-11: 3 g via INTRAVENOUS
  Filled 2023-08-11: qty 8

## 2023-08-11 MED ORDER — AMOXICILLIN-POT CLAVULANATE 875-125 MG PO TABS: 1.0000 | ORAL_TABLET | Freq: Two times a day (BID) | ORAL | 0 refills | Status: AC

## 2023-08-11 MED ORDER — KETOROLAC TROMETHAMINE 30 MG/ML IJ SOLN
15.0000 mg | Freq: Once | INTRAMUSCULAR | Status: AC
  Administered 2023-08-11: 15 mg via INTRAVENOUS
  Filled 2023-08-11: qty 1

## 2023-08-11 MED ORDER — DEXAMETHASONE SODIUM PHOSPHATE 10 MG/ML IJ SOLN
10.0000 mg | Freq: Once | INTRAMUSCULAR | Status: AC
  Administered 2023-08-11: 10 mg via INTRAVENOUS
  Filled 2023-08-11: qty 1

## 2023-08-11 NOTE — ED Triage Notes (Signed)
 Pt to ED via [facility] in. Pt states that she is coming in for dental abscess. Pt has significant swelling to the left side of her jaw that extends into her neck. Pt states that the swelling has been on going since Monday. Pt is having some drooling, but is able to drink her soda. Pt is in NAD.

## 2023-08-11 NOTE — ED Provider Notes (Signed)
 Continuing to await stat CT scan read by radiology.   Sharyn Creamer, MD 08/11/23 1736

## 2023-08-11 NOTE — Discharge Instructions (Addendum)
 OPTIONS FOR DENTAL FOLLOW UP CARE  [employer] Department of Health and Human Services - Local Safety Net Dental Clinics [email or web address]   [facility] [phone])  Sheila [name] [phone])  [place] [phone])  [facility] [phone])  [facility] [phone]) This clinic caters to the indigent population and is on a lottery system. Location: [employer] of Dentistry, [employer], [address], [place] Clinic Hours: Wednesdays from 6pm - 9pm, patients seen by a lottery system. For dates, call or go to [email or web address] Services: Cleanings, fillings and simple extractions. Payment Options: DENTAL WORK IS FREE OF CHARGE. Bring proof of income or support. Best way to get seen: Arrive at 5:15 pm - this is a lottery, NOT first come/first serve, so arriving earlier will not increase your chances of being seen.     [facility] [phone] Select option 1 for emergencies   Location: [facility] of Dentistry, [place], [address], [place] Clinic Hours: No walk-ins accepted - call the day before to schedule an appointment. Check in times are 9:30 am and 1:30 pm. Services: Simple extractions, temporary fillings, pulpectomy/pulp debridement, uncomplicated abscess drainage. Payment Options: PAYMENT IS DUE AT THE TIME OF SERVICE.  Fee is usually $100-200, additional surgical procedures (e.g. abscess drainage) may be extra. Cash, checks, Visa/MasterCard accepted.  Can file Medicaid if patient is covered for dental - patient should call case worker to check. No discount for [facility] patients. Best way to get seen: MUST call the day before and get onto the schedule. Can usually be seen the next 1-2 days. No walk-ins accepted.     [facility] [phone]    Location: [facility], [address], [place] Clinic Hours: M, W, Th, F 8am or 1:30pm, Tues 9a or 1:30 - first come/first served. Services: Simple extractions, temporary fillings, uncomplicated abscess drainage.  You do not need to be an [facility] resident. Payment Options: PAYMENT IS DUE AT THE TIME OF SERVICE. Dental insurance, otherwise sliding scale - bring proof of income or support. Depending on income and treatment needed, cost is usually $50-200. Best way to get seen: Arrive early as it is first come/first served.     [facility] [phone]   Location: [address] Clinic Hours: Mon-Thu 8a-5p Services: Most basic dental services including extractions and fillings. Payment Options: PAYMENT IS DUE AT THE TIME OF SERVICE. Sliding scale, up to 50% off - bring proof if income or support. Medicaid with dental option accepted. Best way to get seen: Call to schedule an appointment, can usually be seen within 2 weeks OR they will try to see walk-ins - show up at 8a or 2p (you may have to wait).     [facility] [phone] [place] RESIDENTS ONLY   Location: [facility], [address], [place], [place] [address] Clinic Hours: By appointment only. Monday - Thursday 8am-5pm, Friday 8am-12pm Services: Cleanings, fillings, extractions. Payment Options: PAYMENT IS DUE AT THE TIME OF SERVICE. Cash, Visa or MasterCard. Sliding scale - $30 minimum per service. Best way to get seen: Come in to office, complete packet and make an appointment - need proof of income or support monies for each household member and proof of [facility] residence. Usually takes about a month to get in.     [facility] [phone]   Location: [address]  [place] Clinic Hours: Walk-in Urgent Care Dental Services are offered Monday-Friday  mornings only. The numbers of emergencies accepted daily is limited to the number of providers available. Maximum 15 - Mondays, Wednesdays & Thursdays Maximum 10 - Tuesdays & Fridays Services: You do not need to be a [facility] resident to be seen for a dental emergency. Emergencies are defined as pain, swelling, abnormal bleeding, or dental trauma. Walkins will receive x-rays if needed. NOTE: Dental cleaning is not an emergency. Payment Options: PAYMENT IS DUE AT THE TIME OF SERVICE. Minimum co-pay is $40.00 for uninsured patients. Minimum co-pay is $3.00 for Medicaid with dental coverage. Dental Insurance is accepted and must be presented at time of visit. Medicare does not cover dental. Forms of payment: Cash, credit card, checks. Best way to get seen: If not previously registered with the clinic, walk-in dental registration begins at 7:15 am and is on a first come/first serve basis. If previously registered with the clinic, call to make an appointment.     [facility] [phone] [place] RESIDENTS ONLY   Location: [address] [place], [place] Clinic Hours: Mon-Thu 10a-2p Services: Extractions only! Payment Options: FREE (donations accepted) - bring proof of income or support Best way to get seen: Call and schedule an appointment OR come at 8am on the 1st Monday of every month (except for holidays) when it is first come/first served.     [facility] [phone]   Location: [address] [place], [place] Clinic Hours: Friday mornings Services, Payment Options, Best way to get seen: Call for info

## 2023-08-11 NOTE — ED Provider Notes (Signed)
 CT Soft Tissue Neck W Contrast Result Date: 08/11/2023 CLINICAL DATA:  Epiglottitis or tonsillitis suspected. Left-sided facial/neck swelling. Dental abscess. EXAM: CT NECK WITH CONTRAST TECHNIQUE: Multidetector CT imaging of the neck was performed using the standard protocol following the bolus administration of intravenous contrast. RADIATION DOSE REDUCTION: This exam was performed according to the departmental dose-optimization program which includes automated exposure control, adjustment of the mA and/or kV according to patient size and/or use of iterative reconstruction technique. CONTRAST:  OMNIPAQUE  IOHEXOL  300 MG/ML  SOLN COMPARISON:  None Available. FINDINGS: Pharynx and larynx: No evidence of a mass or swelling. Patent airway. No retropharyngeal fluid collection. Salivary glands: No inflammation, mass, or stone. Thyroid : Subcentimeter bilateral thyroid  nodules for which no follow-up imaging is recommended. 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 Lymph nodes: No enlarged or suspicious lymph nodes in the neck. Vascular: Major vascular structures of the neck are grossly patent. Cervical carotid atherosclerosis resulting in mild stenosis of the left common carotid and bilateral proximal internal carotid arteries. Limited intracranial: Unremarkable. Visualized orbits: Unremarkable. Mastoids and visualized paranasal sinuses: Mild mucosal thickening and small volume fluid in the left maxillary sinus. Clear mastoid air cells. Skeleton: Largely edentulous, with a few residual broken teeth remaining. Mild periapical lucencies associated with the 2 remaining left mandibular teeth with moderate to prominent inflammatory changes in the soft tissues overlying the mandible at this level. No organized fluid collection. Upper chest: No mass or consolidation in the included lung apices. Other: None. IMPRESSION: Left lower facial soft  tissue swelling/cellulitis which is likely odontogenic. No drainable abscess. Electronically Signed   By: Dasie Hamburg M.D.   On: 08/11/2023 17:42      I will prescribe the patient a narcotic pain medicine due to their condition which I anticipate will cause at least moderate pain short term. I discussed with the patient safe use of narcotic pain medicines, and that they are not to drive, work in dangerous areas, or ever take more than prescribed (no more than 1 pill every 6 hours). We discussed that this is the type of medication that can be  overdosed on and the risks of this type of medicine. Patient is very agreeable to only use as prescribed and to never use more than prescribed.  Patient advises she is previously used oxycodone  in the past appears she has taken 10 mg.  She knows not to combine this with other sedating medications or drive within 8 hours.   Patient is currently awake and alert feels well.  She denies any trouble swallowing no trouble breathing she has no stridor and her work of breathing is normal.  Imaging appears to demonstrate primarily mandibular teeth involved with inflammatory changes but no fluid collection.  Will discharge with course of Augmentin  and careful follow-up with the patient is agreeable with.  Discussed careful return precautions including difficulty swallowing breathing worsening swelling or other symptoms she will return to the ER right away.  No evidence to support [place] angina  Return precautions and treatment recommendations and follow-up discussed with the patient who is agreeable with the plan.  Patient alert fully oriented   Dicky Anes, MD 08/11/23 [phone]

## 2023-08-11 NOTE — ED Provider Notes (Signed)
 [facility] Provider Note   Event Date/Time   First MD Initiated Contact with Patient 08/11/23 1400     (approximate) History  Abscess (Urgent care sent for concern for Ludwig's Angina)  HPI Sheila [name] is a 63 y.o. female with stated past medical history of asthma, hypertension, COPD with continued tobacco abuse, and significant dental caries who presents from urgent care with concerns for possible Ludwig's angina.  Patient states that she has had a tooth ache on the left mandibular jaw over the last 3 days that has been worsening with significant swelling on that side as well.  Patient states that she has painful swallowing but denies any significant difficulty.  Patient denies any breathing difficulty ROS: Patient currently denies any vision changes, tinnitus, difficulty speaking, facial droop, chest pain, shortness of breath, abdominal pain, nausea/vomiting/diarrhea, dysuria, or weakness/numbness/paresthesias in any extremity   Physical Exam  Triage Vital Signs: ED Triage Vitals  Encounter Vitals Group     BP 08/11/23 1337 128/84     Systolic BP Percentile --      Diastolic BP Percentile --      Pulse Rate 08/11/23 1337 71     Resp 08/11/23 1337 16     Temp 08/11/23 1337 99 F (37.2 C)     Temp src --      SpO2 08/11/23 1337 98 %     Weight 08/11/23 1338 152 lb (68.9 kg)     Height 08/11/23 1338 5' 2 (1.575 m)     Head Circumference --      Peak Flow --      Pain Score 08/11/23 1337 10     Pain Loc --      Pain Education --      Exclude from Growth Chart --    Most recent vital signs: Vitals:   08/11/23 1337  BP: 128/84  Pulse: 71  Resp: 16  Temp: 99 F (37.2 C)  SpO2: 98%   General: Awake, oriented x4. CV:  Good peripheral perfusion.  Resp:  Normal effort.  Abd:  No distention.  Other:  Elderly overweight Caucasian female resting comfortably in no acute distress.  Edema and erythema to the left mandibular region with dental caries to  the mandibular molars and surrounding erythema that tracks back along the hallux on the left side without significant peritonsillar swelling or erythema ED Results / Procedures / Treatments  Labs (all labs ordered are listed, but only abnormal results are displayed) Labs Reviewed - No data to display  RADIOLOGY ED MD interpretation: Pending at signout -Agree with radiology assessment Official radiology report(s): No results found. PROCEDURES: Critical Care performed: No Procedures MEDICATIONS ORDERED IN ED: Medications - No data to display IMPRESSION / MDM / ASSESSMENT AND PLAN / ED COURSE  I reviewed the triage vital signs and the nursing notes.                             The patient is on the cardiac monitor to evaluate for evidence of arrhythmia and/or significant heart rate changes. Patient's presentation is most consistent with acute presentation with potential threat to life or bodily function. 63 year old female presents for sore throat No history of immunocompromise. Nontoxic appearance. Patient euvolemic with no trismus. No airway compromise. No change in voice, exudates, enlarged lymph nodes. Able to tolerate PO. Given History and Exam I have low suspicion for this presentation being caused by PTA,  RPA, Ludwigs angina, Epiglottitis or Bacterial Tracheitis, EBV, acute HIV, or Strep throat. CT of the soft tissue of the neck with IV contrast pending Dispo: Care of this patient will be signed out to the oncoming physician at the end of my shift.  All pertinent patient information conveyed and all questions answered.  All further care and disposition decisions will be made by the oncoming physician.   FINAL CLINICAL IMPRESSION(S) / ED DIAGNOSES   Final diagnoses:  None   Rx / DC Orders   ED Discharge Orders     None      Note:  This document was prepared using Dragon voice recognition software and may include unintentional dictation errors.   Javiana Anwar K,  MD 08/12/23 1435

## 2023-08-12 ENCOUNTER — Ambulatory Visit: Payer: Medicaid Other | Admitting: Cardiology

## 2023-08-15 ENCOUNTER — Telehealth (HOSPITAL_COMMUNITY): Payer: Self-pay | Admitting: Emergency Medicine

## 2023-08-15 NOTE — Telephone Encounter (Signed)
 Reaching out to patient to offer assistance regarding upcoming cardiac imaging study; pt verbalizes understanding of appt date/time, parking situation and where to check in, pre-test NPO status and medications ordered, and verified current allergies; name and call back number provided for further questions should they arise Rockwell Alexandria RN Navigator Cardiac Imaging [facility] [phone] office [phone] cell

## 2023-08-17 ENCOUNTER — Ambulatory Visit
Admission: RE | Admit: 2023-08-17 | Discharge: 2023-08-17 | Disposition: A | Discharge status: Home / Self Care | Payer: Medicaid Other | Source: Ambulatory Visit | Attending: Cardiology | Admitting: Cardiology

## 2023-08-17 DIAGNOSIS — R000 Tachycardia, unspecified: Secondary | ICD-10-CM | POA: Insufficient documentation

## 2023-08-17 DIAGNOSIS — I5032 Chronic diastolic (congestive) heart failure: Secondary | ICD-10-CM | POA: Diagnosis present

## 2023-08-17 DIAGNOSIS — I4892 Unspecified atrial flutter: Secondary | ICD-10-CM | POA: Diagnosis present

## 2023-08-17 MED ORDER — SODIUM CHLORIDE 0.9 % IV BOLUS: 150.0000 mL | Freq: Once | INTRAVENOUS | Status: DC

## 2023-08-17 MED ORDER — IOHEXOL 350 MG/ML SOLN
75.0000 mL | Freq: Once | INTRAVENOUS | Status: AC | PRN
  Administered 2023-08-17: 75 mL via INTRAVENOUS

## 2023-08-19 ENCOUNTER — Ambulatory Visit (INDEPENDENT_AMBULATORY_CARE_PROVIDER_SITE_OTHER): Payer: Medicaid Other | Admitting: Family Medicine

## 2023-08-19 ENCOUNTER — Encounter: Payer: Self-pay | Admitting: Family Medicine

## 2023-08-19 VITALS — BP 127/75 | HR 53 | Temp 97.5°F | Resp 16 | Ht 62.0 in | Wt 152.8 lb

## 2023-08-19 DIAGNOSIS — I10 Essential (primary) hypertension: Secondary | ICD-10-CM | POA: Diagnosis not present

## 2023-08-19 DIAGNOSIS — I6522 Occlusion and stenosis of left carotid artery: Secondary | ICD-10-CM

## 2023-08-19 DIAGNOSIS — J42 Unspecified chronic bronchitis: Secondary | ICD-10-CM

## 2023-08-19 DIAGNOSIS — I7300 Raynaud's syndrome without gangrene: Secondary | ICD-10-CM

## 2023-08-19 DIAGNOSIS — I4891 Unspecified atrial fibrillation: Secondary | ICD-10-CM

## 2023-08-19 DIAGNOSIS — R195 Other fecal abnormalities: Secondary | ICD-10-CM

## 2023-08-19 DIAGNOSIS — Z881 Allergy status to other antibiotic agents status: Secondary | ICD-10-CM

## 2023-08-19 DIAGNOSIS — K047 Periapical abscess without sinus: Secondary | ICD-10-CM

## 2023-08-19 DIAGNOSIS — Z1211 Encounter for screening for malignant neoplasm of colon: Secondary | ICD-10-CM

## 2023-08-19 DIAGNOSIS — I771 Stricture of artery: Secondary | ICD-10-CM

## 2023-08-19 DIAGNOSIS — Z1231 Encounter for screening mammogram for malignant neoplasm of breast: Secondary | ICD-10-CM

## 2023-08-19 MED ORDER — AMOXICILLIN-POT CLAVULANATE 875-125 MG PO TABS: 1.0000 | ORAL_TABLET | Freq: Two times a day (BID) | ORAL | 0 refills | Status: DC

## 2023-08-19 MED ORDER — ALPRAZOLAM 0.5 MG PO TABS: 0.5000 mg | ORAL_TABLET | Freq: Four times a day (QID) | ORAL | 3 refills | Status: DC | PRN

## 2023-08-19 MED ORDER — OXYCODONE HCL 10 MG PO TABS: 10.0000 mg | ORAL_TABLET | Freq: Three times a day (TID) | ORAL | 0 refills | Status: DC | PRN

## 2023-08-19 NOTE — Assessment & Plan Note (Signed)
 Sounds like she has developed Raynaud's syndrome in her left hand.  Asked her to wear gloves.  Advised that quitting smoking will help resolve this problem.  Is on Eliquis.

## 2023-08-19 NOTE — Assessment & Plan Note (Signed)
 On Breztri and albuterol.  Still has a daily cough.  Experiences shortness of breath in her daytime activities.  Discussed quitting smoking.  Discussed [place] quit now as a tool that might help her.  She has declined pharmacology support to quit

## 2023-08-19 NOTE — Assessment & Plan Note (Signed)
 She is currently taking amlodipine besylate 5 mg carvedilol 6.25 twice daily potassium chloride 20 mEq daily Entresto 49-51 twice daily.  Her blood pressure is very well-controlled as her follow-up blood pressure was 127/75.

## 2023-08-19 NOTE — Assessment & Plan Note (Signed)
 Has insurance we will set up screening mammogram for her.

## 2023-08-19 NOTE — Assessment & Plan Note (Signed)
 She is on amiodarone and she is having paroxysmal atrial fibrillation now.  She is due cardiac ablation but this has been postponed because she has got a dental abscess.

## 2023-08-19 NOTE — Assessment & Plan Note (Signed)
 She had a positive Cologuard 5/24.  She has not seen gastroenterology.  Now it must be postponed until her atrial fibrillation is controlled.  Cardiology does not want her being sedated.

## 2023-08-19 NOTE — Assessment & Plan Note (Signed)
 Has 2 more days of Augmentin.  Infection still present.  Add mention 875/125 1 p.o. twice daily #20.  Recommended warm salt gargles

## 2023-08-19 NOTE — Progress Notes (Signed)
 Established Patient Office Visit  Subjective   Patient ID: Sheila [name], female    DOB: [birth year]  Age: 63 y.o. MRN: [id number]  Chief Complaint  Patient presents with   Establish Care   Medical Management of Chronic Issues   Abscess    2 teeth x 2 weeks//under treatment at this time/antibiotic    HPI  Sheila [name] has paroxysmal atrial fibrillation.  She could not have her ablation because she developed a dental abscess.  She still has a swollen and painful face.  No procedures/surgeries until she gets her A-fib under control.  She wakes up at night and she is having atrial fibrillation it might last a couple of hours.  She does not break out in a sweat but she does have chest pains with it.  Advised she should go to the hospital as she is having chest pains because she has already had an NSTEMI. She is on Augmentin  for her dental abscess she has 2 more days on her prescription. COPD she has chronic cough.  She is on Breztri  and albuterol .  She is still smoking but has cut down somewhat. She has anxiety and takes Xanax  as needed and 60 tablets last 2 months or more.   She has been on pain medication (oxycodone ) for a compression fracture of her vertebrae.  She has been taking pain medicine for a couple of years.  Advised that she should not take both Xanax  and oxycodone  because it increases her risk of an overdose. Still smoking but has cut back a little bit.  Discussed that the patches do not work for her. She complains that her left hand turns white then blue then bright red and is extremely painful.  Mostly the index and middle fingers do this. She just had all of her labs drawn for pain management and the cardiologist says she is asking not to have labs today.  ROS    Objective:     BP 127/75 (BP Location: Right Arm, Patient Position: Sitting, Cuff Size: Normal)   Pulse (!) 53   Temp (!) 97.5 F (36.4 C) (Oral)   Resp 16   Ht 5' 2 (1.575 m)   Wt 152 lb 12.8 oz (69.3 kg)   LMP  08/09/2008 (Within Years)   SpO2 96%   BMI 27.95 kg/m    Physical Exam Vitals and nursing note reviewed.  Constitutional:      Appearance: Normal appearance.  HENT:     Head: Normocephalic and atraumatic.  Eyes:     Conjunctiva/sclera: Conjunctivae normal.  Cardiovascular:     Rate and Rhythm: Normal rate and regular rhythm.  Pulmonary:     Effort: Pulmonary effort is normal.     Breath sounds: Normal breath sounds.  Musculoskeletal:     Right lower leg: No edema.     Left lower leg: No edema.  Skin:    General: Skin is warm and dry.  Neurological:     Mental Status: She is alert and oriented to person, place, and time.  Psychiatric:        Mood and Affect: Mood normal.        Behavior: Behavior normal.        Thought Content: Thought content normal.        Judgment: Judgment normal.          No results found for any visits on 08/19/23.    The ASCVD Risk score (Arnett DK, et al., 2019) failed to calculate for  the following reasons:   Risk score cannot be calculated because patient has a medical history suggesting prior/existing ASCVD    Assessment & Plan:  Screening for colon cancer -     Ambulatory referral to Gastroenterology  Encounter for screening mammogram for malignant neoplasm of breast  Positive colorectal cancer screening using Cologuard test Assessment & Plan: She had a positive Cologuard 5/24.  She has not seen gastroenterology.  Now it must be postponed until her atrial fibrillation is controlled.  Cardiology does not want her being sedated.  Orders: -     Ambulatory referral to Gastroenterology  Atrial fibrillation with rapid ventricular response [facility]) Assessment & Plan: She is on amiodarone  and she is having paroxysmal atrial fibrillation now.  She is due cardiac ablation but this has been postponed because she has got a dental abscess.  Orders: -     CBC with Differential/Platelet; Future -     CMP14+EGFR; Future -     TSH+T4F+T3Free;  Future -     Lipid panel; Future  Stenosis of left carotid artery -     Lipid panel; Future  Chronic bronchitis, unspecified chronic bronchitis type (HCC) Assessment & Plan: On Breztri  and albuterol .  Still has a daily cough.  Experiences shortness of breath in her daytime activities.  Discussed quitting smoking.  Discussed [place] quit now as a tool that might help her.  She has declined pharmacology support to quit   Dental abscess Assessment & Plan: Has 2 more days of Augmentin .  Infection still present.  Add mention 875/125 1 p.o. twice daily #20.  Recommended warm salt gargles  Orders: -     oxyCODONE  HCl; Take 1 tablet (10 mg total) by mouth 3 (three) times daily as needed.  Dispense: 12 tablet; Refill: 0  Allergic reaction to Augmentin   Screening mammogram for breast cancer Assessment & Plan: Has insurance we will set up screening mammogram for her.   Subclavian artery stenosis (HCC)  Raynaud's disease without gangrene Assessment & Plan: Sounds like she has developed Raynaud's syndrome in her left hand.  Asked her to wear gloves.  Advised that quitting smoking will help resolve this problem.  Is on Eliquis .   Essential hypertension Assessment & Plan: She is currently taking amlodipine  besylate 5 mg carvedilol  6.25 twice daily potassium chloride  20 mEq daily Entresto  49-51 twice daily.  Her blood pressure is very well-controlled as her follow-up blood pressure was 127/75.   Other orders -     ALPRAZolam ; Take 1 tablet (0.5 mg total) by mouth 4 (four) times daily as needed for anxiety.  Dispense: 120 tablet; Refill: 3 -     Amoxicillin -Pot Clavulanate; Take 1 tablet by mouth 2 (two) times daily.  Dispense: 20 tablet; Refill: 0     Return in about 4 weeks (around 09/16/2023).    Starnisha Batrez K Adamary Savary, MD

## 2023-08-23 MED ORDER — AMOXICILLIN-POT CLAVULANATE 875-125 MG PO TABS: 1.0000 | ORAL_TABLET | Freq: Two times a day (BID) | ORAL | 0 refills | Status: DC

## 2023-08-23 NOTE — Addendum Note (Signed)
 Addended by: Alease Medina on: 08/23/2023 11:18 AM   Modules accepted: Orders

## 2023-08-30 ENCOUNTER — Other Ambulatory Visit: Payer: Self-pay | Admitting: Physician Assistant

## 2023-08-30 NOTE — Telephone Encounter (Signed)
Prescription refill request for Eliquis received. Indication: PAF Last office visit: 06/22/23  Jeanie Cooks MD Scr: 1.03 on 08/11/23  Epic Age: 63 Weight: 70.3kg  Based on above findings Eliquis 5mg  twice daily is the appropriate dose.  Refill approved.

## 2023-09-02 ENCOUNTER — Encounter: Payer: Self-pay | Admitting: *Deleted

## 2023-09-06 ENCOUNTER — Other Ambulatory Visit
Admission: RE | Admit: 2023-09-06 | Discharge: 2023-09-06 | Disposition: A | Discharge status: Home / Self Care | Payer: Medicaid Other | Source: Ambulatory Visit | Attending: Family Medicine | Admitting: Family Medicine

## 2023-09-06 ENCOUNTER — Encounter: Payer: Self-pay | Admitting: Family Medicine

## 2023-09-06 DIAGNOSIS — I4891 Unspecified atrial fibrillation: Secondary | ICD-10-CM | POA: Insufficient documentation

## 2023-09-06 DIAGNOSIS — I6522 Occlusion and stenosis of left carotid artery: Secondary | ICD-10-CM | POA: Diagnosis present

## 2023-09-06 LAB — COMPREHENSIVE METABOLIC PANEL
ALT: 15 U/L (ref 0–44)
AST: 17 U/L (ref 15–41)
Albumin: 3.4 g/dL — ABNORMAL LOW (ref 3.5–5.0)
Alkaline Phosphatase: 67 U/L (ref 38–126)
Anion gap: 15 (ref 5–15)
BUN: 12 mg/dL (ref 8–23)
CO2: 22 mmol/L (ref 22–32)
Calcium: 10 mg/dL (ref 8.9–10.3)
Chloride: 104 mmol/L (ref 98–111)
Creatinine, Ser: 0.78 mg/dL (ref 0.44–1.00)
GFR, Estimated: >60 mL/min (ref >60)
Glucose, Bld: 80 mg/dL (ref 70–99)
Potassium: 3.7 mmol/L (ref 3.5–5.1)
Sodium: 141 mmol/L (ref 135–145)
Total Bilirubin: 0.4 mg/dL (ref 0.0–1.2)
Total Protein: 6.4 g/dL — ABNORMAL LOW (ref 6.5–8.1)

## 2023-09-06 LAB — CBC WITH DIFFERENTIAL/PLATELET
Abs Immature Granulocytes: 0.04 10*3/uL (ref 0.00–0.07)
Basophils Absolute: 0.1 10*3/uL (ref 0.0–0.1)
Basophils Relative: 1 %
Eosinophils Absolute: 0.3 10*3/uL (ref 0.0–0.5)
Eosinophils Relative: 3 %
HCT: 39.8 % (ref 36.0–46.0)
Hemoglobin: 12.9 g/dL (ref 12.0–15.0)
Immature Granulocytes: 0 %
Lymphocytes Relative: 30 %
Lymphs Abs: 2.8 10*3/uL (ref 0.7–4.0)
MCH: 32.4 pg (ref 26.0–34.0)
MCHC: 32.4 g/dL (ref 30.0–36.0)
MCV: 100 fL (ref 80.0–100.0)
Monocytes Absolute: 0.8 10*3/uL (ref 0.1–1.0)
Monocytes Relative: 8 %
Neutro Abs: 5.4 10*3/uL (ref 1.7–7.7)
Neutrophils Relative %: 58 %
Platelets: 363 10*3/uL (ref 150–400)
RBC: 3.98 MIL/uL (ref 3.87–5.11)
RDW: 13.5 % (ref 11.5–15.5)
WBC: 9.4 10*3/uL (ref 4.0–10.5)
nRBC: 0 % (ref 0.0–0.2)

## 2023-09-06 LAB — T4, FREE: Free T4: 0.74 ng/dL (ref 0.61–1.12)

## 2023-09-06 LAB — LIPID PANEL
Cholesterol: 144 mg/dL (ref 0–200)
HDL: 43 mg/dL (ref >40)
LDL Cholesterol: 76 mg/dL (ref 0–99)
Total CHOL/HDL Ratio: 3.3 {ratio}
Triglycerides: 126 mg/dL (ref <150)
VLDL: 25 mg/dL (ref 0–40)

## 2023-09-06 LAB — TSH: TSH: 5.923 u[IU]/mL — ABNORMAL HIGH (ref 0.350–4.500)

## 2023-09-06 NOTE — Addendum Note (Signed)
Addended by: Lonell Face C on: 09/06/2023 11:14 AM   Modules accepted: Orders

## 2023-09-07 LAB — T3, FREE: T3, Free: 2.3 pg/mL (ref 2.0–4.4)

## 2023-09-09 NOTE — Pre-Procedure Instructions (Addendum)
Instructed patient on the following items: Arrival time 0800 Nothing to eat or drink after midnight No meds AM of procedure Responsible person to drive you home and stay with you for 24 hrs  Have you missed any doses of anti-coagulant Eliquis- takes twice a day, hasn't missed any doses.  Don't take dose on Monday morning.

## 2023-09-12 ENCOUNTER — Ambulatory Visit (HOSPITAL_COMMUNITY)
Admission: RE | Admit: 2023-09-12 | Discharge: 2023-09-12 | Disposition: A | Discharge status: Home / Self Care | Payer: Medicaid Other | Attending: Cardiology | Admitting: Cardiology

## 2023-09-12 ENCOUNTER — Encounter (HOSPITAL_COMMUNITY)
Admission: RE | Disposition: A | Discharge status: Home / Self Care | Payer: Self-pay | Source: Home / Self Care | Attending: Cardiology

## 2023-09-12 ENCOUNTER — Encounter (HOSPITAL_COMMUNITY): Payer: Self-pay | Admitting: Cardiology

## 2023-09-12 ENCOUNTER — Ambulatory Visit (HOSPITAL_BASED_OUTPATIENT_CLINIC_OR_DEPARTMENT_OTHER): Payer: Medicaid Other | Admitting: Anesthesiology

## 2023-09-12 ENCOUNTER — Ambulatory Visit (HOSPITAL_COMMUNITY): Payer: Self-pay | Admitting: Anesthesiology

## 2023-09-12 ENCOUNTER — Other Ambulatory Visit (HOSPITAL_COMMUNITY): Payer: Self-pay

## 2023-09-12 ENCOUNTER — Other Ambulatory Visit: Payer: Self-pay

## 2023-09-12 DIAGNOSIS — E785 Hyperlipidemia, unspecified: Secondary | ICD-10-CM | POA: Diagnosis not present

## 2023-09-12 DIAGNOSIS — I10 Essential (primary) hypertension: Secondary | ICD-10-CM

## 2023-09-12 DIAGNOSIS — I483 Typical atrial flutter: Secondary | ICD-10-CM | POA: Diagnosis not present

## 2023-09-12 DIAGNOSIS — I5032 Chronic diastolic (congestive) heart failure: Secondary | ICD-10-CM | POA: Insufficient documentation

## 2023-09-12 DIAGNOSIS — F17210 Nicotine dependence, cigarettes, uncomplicated: Secondary | ICD-10-CM

## 2023-09-12 DIAGNOSIS — J449 Chronic obstructive pulmonary disease, unspecified: Secondary | ICD-10-CM | POA: Insufficient documentation

## 2023-09-12 DIAGNOSIS — I4891 Unspecified atrial fibrillation: Secondary | ICD-10-CM | POA: Diagnosis not present

## 2023-09-12 DIAGNOSIS — Z7901 Long term (current) use of anticoagulants: Secondary | ICD-10-CM | POA: Diagnosis not present

## 2023-09-12 DIAGNOSIS — I252 Old myocardial infarction: Secondary | ICD-10-CM | POA: Diagnosis not present

## 2023-09-12 DIAGNOSIS — I471 Supraventricular tachycardia, unspecified: Secondary | ICD-10-CM | POA: Insufficient documentation

## 2023-09-12 DIAGNOSIS — Z79899 Other long term (current) drug therapy: Secondary | ICD-10-CM | POA: Diagnosis not present

## 2023-09-12 DIAGNOSIS — I739 Peripheral vascular disease, unspecified: Secondary | ICD-10-CM | POA: Insufficient documentation

## 2023-09-12 DIAGNOSIS — I110 Hypertensive heart disease with heart failure: Secondary | ICD-10-CM | POA: Insufficient documentation

## 2023-09-12 DIAGNOSIS — K219 Gastro-esophageal reflux disease without esophagitis: Secondary | ICD-10-CM | POA: Diagnosis not present

## 2023-09-12 DIAGNOSIS — I4892 Unspecified atrial flutter: Secondary | ICD-10-CM

## 2023-09-12 HISTORY — PX: ATRIAL FIBRILLATION ABLATION: EP1191

## 2023-09-12 SURGERY — ATRIAL FIBRILLATION ABLATION
Anesthesia: General

## 2023-09-12 MED ORDER — HEPARIN SODIUM (PORCINE) 1000 UNIT/ML IJ SOLN
INTRAMUSCULAR | Status: DC | PRN
  Administered 2023-09-12: 1000 [IU] via INTRAVENOUS

## 2023-09-12 MED ORDER — HEPARIN (PORCINE) IN NACL 1000-0.9 UT/500ML-% IV SOLN
INTRAVENOUS | Status: DC | PRN
  Administered 2023-09-12 (×3): 500 mL

## 2023-09-12 MED ORDER — ACETAMINOPHEN 500 MG PO TABS
1000.0000 mg | ORAL_TABLET | Freq: Once | ORAL | Status: AC
  Administered 2023-09-12: 1000 mg via ORAL
  Filled 2023-09-12: qty 2

## 2023-09-12 MED ORDER — SUGAMMADEX SODIUM 200 MG/2ML IV SOLN
INTRAVENOUS | Status: DC | PRN
  Administered 2023-09-12: 200 mg via INTRAVENOUS

## 2023-09-12 MED ORDER — FENTANYL CITRATE (PF) 100 MCG/2ML IJ SOLN
INTRAMUSCULAR | Status: AC
  Filled 2023-09-12: qty 2

## 2023-09-12 MED ORDER — ACETAMINOPHEN 325 MG PO TABS: 650.0000 mg | ORAL_TABLET | ORAL | Status: DC | PRN

## 2023-09-12 MED ORDER — APIXABAN 5 MG PO TABS
5.0000 mg | ORAL_TABLET | Freq: Two times a day (BID) | ORAL | Status: DC
  Administered 2023-09-12: 5 mg via ORAL
  Filled 2023-09-12: qty 1

## 2023-09-12 MED ORDER — SODIUM CHLORIDE 0.9% FLUSH: 3.0000 mL | Freq: Two times a day (BID) | INTRAVENOUS | Status: DC

## 2023-09-12 MED ORDER — ONDANSETRON HCL 4 MG/2ML IJ SOLN: 4.0000 mg | Freq: Four times a day (QID) | INTRAMUSCULAR | Status: DC | PRN

## 2023-09-12 MED ORDER — LIDOCAINE 2% (20 MG/ML) 5 ML SYRINGE
INTRAMUSCULAR | Status: DC | PRN
  Administered 2023-09-12: 80 mg via INTRAVENOUS

## 2023-09-12 MED ORDER — ROCURONIUM BROMIDE 10 MG/ML (PF) SYRINGE
PREFILLED_SYRINGE | INTRAVENOUS | Status: DC | PRN
  Administered 2023-09-12: 60 mg via INTRAVENOUS
  Administered 2023-09-12: 20 mg via INTRAVENOUS

## 2023-09-12 MED ORDER — SODIUM CHLORIDE 0.9 % IV SOLN: 250.0000 mL | INTRAVENOUS | Status: DC | PRN

## 2023-09-12 MED ORDER — HEPARIN SODIUM (PORCINE) 1000 UNIT/ML IJ SOLN
INTRAMUSCULAR | Status: AC
  Filled 2023-09-12: qty 10

## 2023-09-12 MED ORDER — PANTOPRAZOLE SODIUM 40 MG PO TBEC
40.0000 mg | DELAYED_RELEASE_TABLET | Freq: Every day | ORAL | 0 refills | Status: AC
  Filled 2023-09-12 (×2): qty 45, 45d supply, fill #0

## 2023-09-12 MED ORDER — PANTOPRAZOLE SODIUM 40 MG PO TBEC
40.0000 mg | DELAYED_RELEASE_TABLET | Freq: Every day | ORAL | Status: DC
  Administered 2023-09-12: 40 mg via ORAL
  Filled 2023-09-12: qty 1

## 2023-09-12 MED ORDER — IPRATROPIUM-ALBUTEROL 0.5-2.5 (3) MG/3ML IN SOLN
RESPIRATORY_TRACT | Status: AC
  Filled 2023-09-12: qty 3

## 2023-09-12 MED ORDER — PROPOFOL 10 MG/ML IV BOLUS
INTRAVENOUS | Status: DC | PRN
  Administered 2023-09-12: 130 mg via INTRAVENOUS

## 2023-09-12 MED ORDER — SODIUM CHLORIDE 0.9% FLUSH: 3.0000 mL | INTRAVENOUS | Status: DC | PRN

## 2023-09-12 MED ORDER — OMEPRAZOLE 40 MG PO CPDR: 40.0000 mg | DELAYED_RELEASE_CAPSULE | Freq: Every day | ORAL | Status: DC

## 2023-09-12 MED ORDER — IPRATROPIUM-ALBUTEROL 0.5-2.5 (3) MG/3ML IN SOLN: 3.0000 mL | Freq: Once | RESPIRATORY_TRACT | Status: DC

## 2023-09-12 MED ORDER — PHENYLEPHRINE 80 MCG/ML (10ML) SYRINGE FOR IV PUSH (FOR BLOOD PRESSURE SUPPORT)
PREFILLED_SYRINGE | INTRAVENOUS | Status: DC | PRN
  Administered 2023-09-12 (×2): 120 ug via INTRAVENOUS

## 2023-09-12 MED ORDER — FENTANYL CITRATE (PF) 250 MCG/5ML IJ SOLN
INTRAMUSCULAR | Status: DC | PRN
  Administered 2023-09-12: 50 ug via INTRAVENOUS

## 2023-09-12 MED ORDER — PHENYLEPHRINE HCL-NACL 20-0.9 MG/250ML-% IV SOLN
INTRAVENOUS | Status: DC | PRN
  Administered 2023-09-12: 20 ug/min via INTRAVENOUS

## 2023-09-12 MED ORDER — PROPOFOL 500 MG/50ML IV EMUL
INTRAVENOUS | Status: DC | PRN
  Administered 2023-09-12: 25 ug/kg/min via INTRAVENOUS

## 2023-09-12 MED ORDER — DEXAMETHASONE SODIUM PHOSPHATE 10 MG/ML IJ SOLN
INTRAMUSCULAR | Status: DC | PRN
  Administered 2023-09-12: 10 mg via INTRAVENOUS

## 2023-09-12 MED ORDER — ONDANSETRON HCL 4 MG/2ML IJ SOLN
INTRAMUSCULAR | Status: DC | PRN
  Administered 2023-09-12: 4 mg via INTRAVENOUS

## 2023-09-12 MED ORDER — SODIUM CHLORIDE 0.9 % IV SOLN: INTRAVENOUS | Status: DC

## 2023-09-12 SURGICAL SUPPLY — 20 items
BAG SNAP BAND KOVER 36X36 (MISCELLANEOUS) IMPLANT
CATH 8FR REPROCESSED SOUNDSTAR (CATHETERS) ×1
CATH 8FR SOUNDSTAR REPROCESSED (CATHETERS) IMPLANT
CATH ABLAT QDOT MICRO BI TC DF (CATHETERS) IMPLANT
CATH OCTARAY 2.0 F 3-3-3-3-3 (CATHETERS) IMPLANT
CATH WEBSTER BI DIR CS D-F CRV (CATHETERS) IMPLANT
CLOSURE PERCLOSE PROSTYLE (VASCULAR PRODUCTS) IMPLANT
COVER SWIFTLINK CONNECTOR (BAG) ×1 IMPLANT
DILATOR VESSEL 38 20CM 16FR (INTRODUCER) IMPLANT
GUIDEWIRE INQWIRE 1.5J.035X260 (WIRE) IMPLANT
INQWIRE 1.5J .035X260CM (WIRE)
KIT VERSACROSS CNCT FARADRIVE (KITS) IMPLANT
PACK EP LF (CUSTOM PROCEDURE TRAY) ×1 IMPLANT
PAD DEFIB RADIO PHYSIO CONN (PAD) ×1 IMPLANT
PATCH CARTO3 (PAD) IMPLANT
SHEATH FARADRIVE STEERABLE (SHEATH) IMPLANT
SHEATH PINNACLE 8F 10CM (SHEATH) IMPLANT
SHEATH PINNACLE 9F 10CM (SHEATH) IMPLANT
SHEATH PROBE COVER 6X72 (BAG) IMPLANT
TUBING SMART ABLATE COOLFLOW (TUBING) IMPLANT

## 2023-09-12 NOTE — H&P (Signed)
Electrophysiology Office Follow up Visit Note:     Date:  09/12/2023    ID:  Sheila [name], DOB 11/16/1960, MRN [id number]   PCP:  Alease Medina, MD      [facility] Cardiologist:  Julien Nordmann, MD  [facility] Electrophysiologist:  Lanier Prude, MD      Interval History:       Sheila [name] is a 63 y.o. female who presents for a follow up visit.    Discussed the use of AI scribe software for clinical note transcription with the patient, who gave verbal consent to proceed.   History of Present Illness   Sheila [name], a 63 year old with a history of atrial fibrillation and flutter, SVT, hypertension, hyperlipidemia, and COPD, presents for follow-up. She is currently on Eliquis for stroke prophylaxis and amiodarone. She was previously deemed not a candidate for catheter ablation due to poor respiratory status. However, she reports an improved respiratory status since the last appointment. Despite this improvement, she continues to smoke, albeit at a reduced rate of half a pack a day. She experiences palpitations, sometimes multiple times a day, and sometimes going a week without any. She has been maintaining sinus rhythm as of an EKG on August 30th, 2024.      Presents for AF ablation today. Procedure reviewed.       Objective Past medical, surgical, social and family history were reviewed.   ROS:   Please see the history of present illness.    All other systems reviewed and are negative.   EKGs/Labs/Other Studies Reviewed:     The following studies were reviewed today:         Physical Exam:     VS:  BP 92/62 (BP Location: Left Arm, Patient Position: Sitting, Cuff Size: Large)   Pulse 57   Ht 5\' 2"  (1.575 m)   Wt 155 lb (70.3 kg)   LMP 08/09/2008 (Within Years)   SpO2 98%   BMI 28.35 kg/m         Wt Readings from Last 3 Encounters:  06/22/23 155 lb (70.3 kg)  05/31/23 157 lb (71.2 kg)  05/11/23 158 lb (71.7 kg)      Physical Exam   GEN: No  distress.  CARD: RRR, no MRG RESP: No IWOB. No Wheezing. Good air movement.         Assessment ASSESSMENT:     1. Atrial flutter, unspecified type (HCC)   2. Wide-complex tachycardia   3. Chronic heart failure with preserved ejection fraction (HCC)     PLAN:     In order of problems listed above:   Assessment and Plan    Atrial Fibrillation/Flutter and SVT Maintaining sinus rhythm on amiodarone and Eliquis. Discussed the risks/benefits of catheter ablation given her moderate risk for postop or intraop complications due to reduced lung function. Patient is interested in pursuing ablation to manage arrhythmia and potentially discontinue amiodarone due to its lung side effects. -Schedule catheter ablation procedure. -Continue Eliquis and amiodarone until further notice.   COPD Improved respiratory status reported. Patient is still smoking, albeit reduced to half a pack a day. -Encourage further reduction in smoking, aiming for a quarter pack a day. -Notify team if there is a change in breathing status.   Hyperlipidemia and Hypertension No changes or concerns discussed in the conversation. -Continue current management.   General Health Maintenance / Followup Plans -Follow up after catheter ablation procedure to assess recovery and arrhythmia management.  Discussed treatment options today for AF including antiarrhythmic drug therapy and ablation. Discussed risks, recovery and likelihood of success with each treatment strategy. Risk, benefits, and alternatives to EP study and ablation for afib were discussed. These risks include but are not limited to stroke, bleeding, vascular damage, tamponade, perforation, damage to the esophagus, lungs, phrenic nerve and other structures, pulmonary vein stenosis, worsening renal function, coronary vasospasm and death.  Discussed potential need for repeat ablation procedures and antiarrhythmic drugs after an initial ablation. The  patient understands these risk and wishes to proceed.  We will therefore proceed with catheter ablation at the next available time.  Carto, ICE, anesthesia are requested for the procedure.  Will also obtain CT PV protocol prior to the procedure to exclude LAA thrombus and further evaluate atrial anatomy.             Signed, Steffanie Dunn, MD, [facility], [facility] 09/12/2023 Electrophysiology [facility]

## 2023-09-12 NOTE — Anesthesia Preprocedure Evaluation (Addendum)
Anesthesia Evaluation  Patient identified by MRN, date of birth, ID band Patient awake    Reviewed: Allergy & Precautions, NPO status , Patient's Chart, lab work & pertinent test results, reviewed documented beta blocker date and time   Airway Mallampati: II  TM Distance: <3 FB Neck ROM: Full    Dental  (+) Dental Advisory Given, Poor Dentition, Missing, Chipped   Pulmonary asthma , COPD,  COPD inhaler, Current SmokerPatient did not abstain from smoking.   Pulmonary exam normal breath sounds clear to auscultation       Cardiovascular hypertension, Pt. on medications and Pt. on home beta blockers + Past MI and + Peripheral Vascular Disease  Normal cardiovascular exam+ dysrhythmias Atrial Fibrillation  Rhythm:Regular Rate:Normal  Echo 11/28/22: 1. Left ventricular ejection fraction, by estimation, is 55 to 60%. The  left ventricle has normal function. The left ventricle has no regional  wall motion abnormalities. There is mild left ventricular hypertrophy.  Left ventricular diastolic parameters  are consistent with Grade II diastolic dysfunction (pseudonormalization).   2. Right ventricular systolic function is normal. The right ventricular  size is normal.   3. The mitral valve is normal in structure. No evidence of mitral valve  regurgitation.   4. The aortic valve was not well visualized. Aortic valve regurgitation  is not visualized. Aortic valve sclerosis is present, with no evidence of  aortic valve stenosis.   5. The inferior vena cava is normal in size with greater than 50%  respiratory variability, suggesting right atrial pressure of 3 mmHg.     Neuro/Psych negative neurological ROS  negative psych ROS   GI/Hepatic Neg liver ROS,GERD  Medicated,,  Endo/Other  negative endocrine ROS    Renal/GU negative Renal ROS     Musculoskeletal negative musculoskeletal ROS (+)    Abdominal   Peds  Hematology  (+) Blood  dyscrasia (Eliquis)   Anesthesia Other Findings Day of surgery medications reviewed with the patient.  Reproductive/Obstetrics                             Anesthesia Physical Anesthesia Plan  ASA: 3  Anesthesia Plan: General   Post-op Pain Management: Tylenol PO (pre-op)*   Induction: Intravenous  PONV Risk Score and Plan: 2 and Midazolam, Dexamethasone and Ondansetron  Airway Management Planned: Oral ETT  Additional Equipment:   Intra-op Plan:   Post-operative Plan: Extubation in OR  Informed Consent: I have reviewed the patients History and Physical, chart, labs and discussed the procedure including the risks, benefits and alternatives for the proposed anesthesia with the patient or authorized representative who has indicated his/her understanding and acceptance.     Dental advisory given  Plan Discussed with: CRNA  Anesthesia Plan Comments:         Anesthesia Quick Evaluation

## 2023-09-12 NOTE — Progress Notes (Signed)
Client up and walked and tolerated well; bilat groins stable, no bleeding or hematoma °

## 2023-09-12 NOTE — Anesthesia Procedure Notes (Signed)
Procedure Name: Intubation Date/Time: 09/12/2023 9:56 AM  Performed by: Vena Austria, CRNAPre-anesthesia Checklist: Patient identified, Emergency Drugs available, Suction available, Patient being monitored and Timeout performed Patient Re-evaluated:Patient Re-evaluated prior to induction Oxygen Delivery Method: Circle system utilized Preoxygenation: Pre-oxygenation with 100% oxygen Induction Type: IV induction Ventilation: Mask ventilation without difficulty Laryngoscope Size: McGrath and 3 Grade View: Grade I Tube type: Oral Tube size: 7.5 mm Number of attempts: 1 Airway Equipment and Method: Video-laryngoscopy Placement Confirmation: ETT inserted through vocal cords under direct vision, positive ETCO2, CO2 detector and breath sounds checked- equal and bilateral Secured at: 21 cm Tube secured with: Tape

## 2023-09-12 NOTE — Progress Notes (Signed)
Atrial flutter ablation but AF ablation aborted due to "extreme thickening of the interatrial septum".   Dr. Lalla Brothers wants to please arrange for for a cardiac MRI

## 2023-09-12 NOTE — Discharge Instructions (Signed)

## 2023-09-12 NOTE — Transfer of Care (Signed)
Immediate Anesthesia Transfer of Care Note  Patient: Sheila [name]  Procedure(s) Performed: ATRIAL FIBRILLATION ABLATION  Patient Location: PACU and Cath Lab  Anesthesia Type:General  Level of Consciousness: awake  Airway & Oxygen Therapy: Patient Spontanous Breathing and Patient connected to nasal cannula oxygen  Post-op Assessment: Report given to RN and Post -op Vital signs reviewed and stable  Post vital signs: stable  Last Vitals:  Vitals Value Taken Time  BP 123/55 09/12/23 1045  Temp    Pulse 65 09/12/23 1047  Resp 18 09/12/23 1048  SpO2 93 % 09/12/23 1047  Vitals shown include unfiled device data.  Last Pain:  Vitals:   09/12/23 0831  TempSrc: Oral         Complications: There were no known notable events for this encounter.

## 2023-09-12 NOTE — Progress Notes (Signed)
Spoke with Dr. Desmond Lope regarding pt decreased o2 sat and exp wheezing, see flowsheets for VS and oxygen administration, order obtained for duo-neb, safety maintained

## 2023-09-12 NOTE — Anesthesia Postprocedure Evaluation (Signed)
Anesthesia Post Note  Patient: Sheila [name]  Procedure(s) Performed: ATRIAL FIBRILLATION ABLATION     Patient location during evaluation: Cath Lab Anesthesia Type: General Level of consciousness: awake and alert Pain management: pain level controlled Vital Signs Assessment: post-procedure vital signs reviewed and stable Respiratory status: spontaneous breathing, nonlabored ventilation and respiratory function stable Cardiovascular status: blood pressure returned to baseline and stable Postop Assessment: no apparent nausea or vomiting Anesthetic complications: no   There were no known notable events for this encounter.  Last Vitals:  Vitals:   09/12/23 1330 09/12/23 1339  BP: (!) 162/76   Pulse: 71 69  Resp: 17 17  Temp:    SpO2: 90% 95%    Last Pain:  Vitals:   09/12/23 1120  TempSrc: Temporal  PainSc: 0-No pain                 Collene Schlichter

## 2023-09-13 ENCOUNTER — Telehealth (HOSPITAL_COMMUNITY): Payer: Self-pay

## 2023-09-13 NOTE — Telephone Encounter (Signed)
Attempted to reach patient to follow up procedure completed on 09/12/23, no answer. Left VM for patient to return call.

## 2023-09-14 NOTE — Telephone Encounter (Signed)
 Spoke with patient to complete post procedure follow up call.   Patient reports no complications with groin sites. Patient has both groin site bandages still in place. She plans to have her granddaughter remove these bandages on today.    Instructions reviewed with patient:  It is normal to have bruising, tenderness and a pea or marble sized lump/knot at the groin site which can take up to three months to resolve.  Get help right away if you notice sudden swelling at the puncture site.  Check your puncture site every day for signs of infection: fever, redness, swelling, pus drainage, warmth, foul odor or excessive pain. If this occurs, please call the office at [phone], to speak with the nurse. Get help right away if your puncture site is bleeding and the bleeding does not stop after applying firm pressure to the area.  You may continue to have skipped beats/ atrial fibrillation during the first several months after your procedure.  It is very important not to miss any doses of your blood thinner ELIQUIS . Patient restarted medication on the evening of 09/12/23.    You will follow up with the APP ON 10/10/23 after your procedure and 12/12/23 at our [place] office.   Patient verbalized understanding to all instructions provided.

## 2023-09-19 ENCOUNTER — Encounter: Payer: Self-pay | Admitting: Family Medicine

## 2023-09-19 ENCOUNTER — Ambulatory Visit
Admission: RE | Admit: 2023-09-19 | Discharge: 2023-09-19 | Disposition: A | Discharge status: Home / Self Care | Payer: Medicaid Other | Attending: Family Medicine | Admitting: Family Medicine

## 2023-09-19 ENCOUNTER — Ambulatory Visit: Payer: Medicaid Other | Admitting: Family Medicine

## 2023-09-19 ENCOUNTER — Ambulatory Visit
Admission: RE | Admit: 2023-09-19 | Discharge: 2023-09-19 | Disposition: A | Discharge status: Home / Self Care | Payer: Medicaid Other | Source: Ambulatory Visit | Attending: Family Medicine | Admitting: Family Medicine

## 2023-09-19 ENCOUNTER — Other Ambulatory Visit: Payer: Self-pay | Admitting: Family Medicine

## 2023-09-19 VITALS — BP 89/64 | HR 55 | Temp 97.3°F | Resp 16 | Ht 62.0 in | Wt 146.4 lb

## 2023-09-19 DIAGNOSIS — G9331 Postviral fatigue syndrome: Secondary | ICD-10-CM

## 2023-09-19 DIAGNOSIS — E861 Hypovolemia: Secondary | ICD-10-CM

## 2023-09-19 DIAGNOSIS — J441 Chronic obstructive pulmonary disease with (acute) exacerbation: Secondary | ICD-10-CM

## 2023-09-19 MED ORDER — PREDNISONE 10 MG (21) PO TBPK
ORAL_TABLET | ORAL | 0 refills | Status: DC
Start: 2023-09-19 — End: 2023-09-23

## 2023-09-19 MED ORDER — DOXYCYCLINE HYCLATE 100 MG PO CAPS
100.0000 mg | ORAL_CAPSULE | Freq: Two times a day (BID) | ORAL | 0 refills | Status: DC
Start: 2023-09-19 — End: 2023-10-10

## 2023-09-19 MED ORDER — ALBUTEROL SULFATE (2.5 MG/3ML) 0.083% IN NEBU
2.5000 mg | INHALATION_SOLUTION | Freq: Once | RESPIRATORY_TRACT | Status: AC
  Administered 2023-09-19: 2.5 mg via RESPIRATORY_TRACT

## 2023-09-19 NOTE — Telephone Encounter (Signed)
 Copied from CRM [id number]. Topic: Clinical - Medication Refill >> Sep 19, 2023  2:51 PM Yolanda T wrote: Most Recent Primary Care Visit:   Medication:  Oxycodone  HCl 10 MG TABS  albuterol  (PROVENTIL ) (2.5 MG/3ML) 0.083% nebulizer solution   Has the patient contacted their pharmacy? No  Is this the correct pharmacy for this prescription? Yes If no, delete pharmacy and type the correct one.  This is the patient's preferred pharmacy:  [facility] - [place], [place] - [address] [place] [address] Phone: [phone] Fax: [phone]  [facility] [address] [place] [address] Phone: [phone] Fax: [phone]   Has the prescription been filled recently? Yes  Is the patient out of the medication? Yes  Has the patient been seen for an appointment in the last year OR does the patient have an upcoming appointment? Yes  Can we respond through MyChart? Yes  Agent: Please be advised that Rx refills may take up to 3 business days. We ask that you follow-up with your pharmacy.

## 2023-09-19 NOTE — Progress Notes (Signed)
Established Patient Office Visit  Subjective   Patient ID: Sheila [name], female    DOB: February 15, 1961  Age: 63 y.o. MRN: [id number]  Chief Complaint  Patient presents with   Medical Management of Chronic Issues    HPI 63 year old smoker with persistent atrial fibs (flutter and SVT), hypertension, chronic heart failure with preserved ejection fraction, GERD.  She underwent a recent ablation (09/12/2023) for her atrial fibrillation and they stopped her amiodarone. She has had shortness of breath, cough, wheezing but no fever.  Cough is productive of white sputum.  She thinks she has had influenza but she did not take Tamiflu. Blood pressure is low at 96/57 but she reports no orthostatic symptoms.  She reports she is taking fluids well she is on Norvasc 5 mg carvedilol 6.25 mg twice daily Entresto 49-51 mg twice daily. PHQ-9 is 14, GAD-7 is 13.  She has not had a pneumonia vaccine or flu vaccine    ROS    Objective:     BP (!) 89/64 (BP Location: Right Arm, Patient Position: Sitting, Cuff Size: Normal)   Pulse (!) 55   Temp (!) 97.3 F (36.3 C) (Oral)   Resp 16   Ht 5\' 2"  (1.575 m) Comment: per patient  Wt 146 lb 6.4 oz (66.4 kg)   LMP 08/09/2008 (Within Years)   SpO2 94%   BMI 26.78 kg/m    Physical Exam Vitals and nursing note reviewed.  Constitutional:      Appearance: Normal appearance.  HENT:     Head: Normocephalic and atraumatic.  Eyes:     Conjunctiva/sclera: Conjunctivae normal.  Cardiovascular:     Rate and Rhythm: Normal rate and regular rhythm.  Pulmonary:     Effort: Pulmonary effort is normal.     Breath sounds: Wheezing (Significant improvement after albuterol neb) present.  Musculoskeletal:     Right lower leg: No edema.     Left lower leg: No edema.  Skin:    General: Skin is warm and dry.  Neurological:     Mental Status: She is alert and oriented to person, place, and time.  Psychiatric:        Mood and Affect: Mood normal.        Behavior:  Behavior normal.        Thought Content: Thought content normal.        Judgment: Judgment normal.          No results found for any visits on 09/19/23.    The ASCVD Risk score (Arnett DK, et al., 2019) failed to calculate for the following reasons:   Risk score cannot be calculated because patient has a medical history suggesting prior/existing ASCVD    Assessment & Plan:  COPD with acute exacerbation (HCC) Assessment & Plan: Doxycycline 100 mg twice daily and prednisone taper.  Follow-up in clinic in 2 days.  If your symptoms worsen, or you fail to improve, your shortness of breath increases or you feel lightheaded dizzy with ambulation please go to the ER return to clinic  Orders: -     Doxycycline Hyclate; Take 1 capsule (100 mg total) by mouth 2 (two) times daily.  Dispense: 20 capsule; Refill: 0 -     predniSONE; Please take po according to package directions  Dispense: 21 tablet; Refill: 0 -     DG Chest 2 View; Future -     Albuterol Sulfate  Post-influenza syndrome -     DG Chest 2 View; Future  Hypotension due to hypovolemia Assessment & Plan: She is on Entresto 49-51 amlodipine 5 mg and carvedilol 6.25 twice daily.  Push fluids and hold amlodipine.  Follow-up in the office in 2 days.  If you feel worse or not improving, have increased shortness of breath, dizziness upon standing then go to the ER.      Return in about 2 days (around 09/21/2023).    Alease Medina, MD

## 2023-09-20 ENCOUNTER — Encounter (HOSPITAL_COMMUNITY): Payer: Self-pay

## 2023-09-20 ENCOUNTER — Telehealth: Payer: Self-pay

## 2023-09-20 ENCOUNTER — Encounter: Payer: Self-pay | Admitting: Family Medicine

## 2023-09-20 DIAGNOSIS — I959 Hypotension, unspecified: Secondary | ICD-10-CM | POA: Insufficient documentation

## 2023-09-20 MED ORDER — OXYCODONE-ACETAMINOPHEN 5-325 MG PO TABS: 1.0000 | ORAL_TABLET | Freq: Four times a day (QID) | ORAL | 0 refills | Status: DC | PRN

## 2023-09-20 MED ORDER — ALBUTEROL SULFATE (2.5 MG/3ML) 0.083% IN NEBU: 2.5000 mg | INHALATION_SOLUTION | RESPIRATORY_TRACT | 5 refills | Status: AC | PRN

## 2023-09-20 NOTE — Telephone Encounter (Signed)
-----   Message from Alease Medina sent at 09/20/2023  7:05 AM EST ----- Please call and ask the to read CXR today.

## 2023-09-20 NOTE — Assessment & Plan Note (Signed)
She is on Entresto 49-51 amlodipine 5 mg and carvedilol 6.25 twice daily.  Push fluids and hold amlodipine.  Follow-up in the office in 2 days.  If you feel worse or not improving, have increased shortness of breath, dizziness upon standing then go to the ER.

## 2023-09-20 NOTE — Assessment & Plan Note (Signed)
Doxycycline 100 mg twice daily and prednisone taper.  Follow-up in clinic in 2 days.  If your symptoms worsen, or you fail to improve, your shortness of breath increases or you feel lightheaded dizzy with ambulation please go to the ER return to clinic

## 2023-09-20 NOTE — Telephone Encounter (Signed)
Spoke with Radiology Reading room and asked that the CXR be read (expedited) as requested by Dr. Girtha Rm. Radiologist was in agreement and Dr. Girtha Rm notified. Will keep checking for report.

## 2023-09-21 ENCOUNTER — Ambulatory Visit: Payer: Medicaid Other | Admitting: Family Medicine

## 2023-09-21 ENCOUNTER — Encounter: Payer: Self-pay | Admitting: Family Medicine

## 2023-09-21 VITALS — BP 117/73 | HR 56 | Temp 97.3°F | Resp 18 | Ht 62.0 in | Wt 153.0 lb

## 2023-09-21 DIAGNOSIS — J1100 Influenza due to unidentified influenza virus with unspecified type of pneumonia: Secondary | ICD-10-CM | POA: Diagnosis not present

## 2023-09-21 NOTE — Progress Notes (Signed)
   Established Patient Office Visit  Subjective   Patient ID: Sheila [name], female    DOB: 1961-08-06  Age: 63 y.o. MRN: [id number]  Chief Complaint  Patient presents with   Medical Management of Chronic Issues    HPI 63 year old smoker with persistent atrial fibs (flutter and SVT), hypertension, chronic heart failure with preserved ejection fraction, GERD. Sheila [name] has been sick for 10 days with a productive cough and shortness of breath with ambulation.  Chest x-ray Monday afternoon showed Patchy airspace disease in the perihilar right lung suspicious for pneumonia.  She does not want to go to the hospital.  Started her on a prednisone taper and doxycycline.  She is tolerating her medication well.  She reports that she feels better.  O2 sats 91% today.  Denies fever.  Still smoking and smokes in the house.  Still reports shortness of breath with ambulation.  Using albuterol via nebulizer every 4-6 hours. She is eating well and taking fluids. No peripheral edema, PND, orthopnea or chest pains.  Discussed going to the ER and she declined.    ROS    Objective:     BP 117/73 (BP Location: Left Arm, Patient Position: Sitting, Cuff Size: Normal)   Pulse (!) 56   Temp (!) 97.3 F (36.3 C) (Oral)   Resp 18   Ht 5\' 2"  (1.575 m)   Wt 153 lb (69.4 kg)   LMP 08/09/2008 (Within Years)   SpO2 91%   BMI 27.98 kg/m    Physical Exam Vitals and nursing note reviewed.  Constitutional:      Appearance: Normal appearance.  HENT:     Head: Normocephalic and atraumatic.  Eyes:     Conjunctiva/sclera: Conjunctivae normal.  Cardiovascular:     Rate and Rhythm: Normal rate and regular rhythm.  Pulmonary:     Effort: Pulmonary effort is normal.     Breath sounds: Wheezing (very mild wheezing.) present.  Musculoskeletal:     Right lower leg: No edema.     Left lower leg: No edema.  Skin:    General: Skin is warm and dry.  Neurological:     Mental Status: She is alert and oriented to  person, place, and time.  Psychiatric:        Mood and Affect: Mood normal.        Behavior: Behavior normal.        Thought Content: Thought content normal.        Judgment: Judgment normal.          No results found for any visits on 09/21/23.    The ASCVD Risk score (Arnett DK, et al., 2019) failed to calculate for the following reasons:   Risk score cannot be calculated because patient has a medical history suggesting prior/existing ASCVD    Assessment & Plan:  Pneumonia due to influenza Assessment & Plan: She had a viral syndrome and now she has patchy infiltrates on her x-ray.  On doxycycline 100 twice daily and a prednisone taper.  If you are not getting better or feel that you are worse or your shortness of breath intensifies please go to the ER.      Return in about 5 days (around 09/26/2023).    Alease Medina, MD

## 2023-09-21 NOTE — Assessment & Plan Note (Signed)
She had a viral syndrome and now she has patchy infiltrates on her x-ray.  On doxycycline 100 twice daily and a prednisone taper.  If you are not getting better or feel that you are worse or your shortness of breath intensifies please go to the ER.

## 2023-09-26 ENCOUNTER — Emergency Department
Admission: EM | Admit: 2023-09-26 | Discharge: 2023-09-26 | Disposition: A | Discharge status: Home / Self Care | Payer: Medicaid Other | Attending: Emergency Medicine | Admitting: Emergency Medicine

## 2023-09-26 ENCOUNTER — Other Ambulatory Visit: Payer: Self-pay

## 2023-09-26 ENCOUNTER — Encounter: Payer: Self-pay | Admitting: Family Medicine

## 2023-09-26 ENCOUNTER — Ambulatory Visit (INDEPENDENT_AMBULATORY_CARE_PROVIDER_SITE_OTHER): Payer: Medicaid Other | Admitting: Family Medicine

## 2023-09-26 VITALS — BP 70/45 | HR 65 | Temp 97.3°F | Resp 16 | Ht 62.0 in | Wt 150.2 lb

## 2023-09-26 DIAGNOSIS — I10 Essential (primary) hypertension: Secondary | ICD-10-CM | POA: Diagnosis not present

## 2023-09-26 DIAGNOSIS — R7989 Other specified abnormal findings of blood chemistry: Secondary | ICD-10-CM | POA: Diagnosis not present

## 2023-09-26 DIAGNOSIS — K529 Noninfective gastroenteritis and colitis, unspecified: Secondary | ICD-10-CM | POA: Diagnosis not present

## 2023-09-26 DIAGNOSIS — J449 Chronic obstructive pulmonary disease, unspecified: Secondary | ICD-10-CM | POA: Diagnosis not present

## 2023-09-26 DIAGNOSIS — E860 Dehydration: Secondary | ICD-10-CM | POA: Diagnosis not present

## 2023-09-26 DIAGNOSIS — D72829 Elevated white blood cell count, unspecified: Secondary | ICD-10-CM | POA: Insufficient documentation

## 2023-09-26 DIAGNOSIS — I2489 Other forms of acute ischemic heart disease: Secondary | ICD-10-CM

## 2023-09-26 DIAGNOSIS — R109 Unspecified abdominal pain: Secondary | ICD-10-CM | POA: Diagnosis present

## 2023-09-26 LAB — COMPREHENSIVE METABOLIC PANEL
ALT: 19 U/L (ref 0–44)
AST: 14 U/L — ABNORMAL LOW (ref 15–41)
Albumin: 2.6 g/dL — ABNORMAL LOW (ref 3.5–5.0)
Alkaline Phosphatase: 77 U/L (ref 38–126)
Anion gap: 7 (ref 5–15)
BUN: 24 mg/dL — ABNORMAL HIGH (ref 8–23)
CO2: 26 mmol/L (ref 22–32)
Calcium: 9.1 mg/dL (ref 8.9–10.3)
Chloride: 107 mmol/L (ref 98–111)
Creatinine, Ser: 1.04 mg/dL — ABNORMAL HIGH (ref 0.44–1.00)
GFR, Estimated: >60 mL/min (ref >60)
Glucose, Bld: 89 mg/dL (ref 70–99)
Potassium: 4.5 mmol/L (ref 3.5–5.1)
Sodium: 140 mmol/L (ref 135–145)
Total Bilirubin: 0.6 mg/dL (ref 0.0–1.2)
Total Protein: 6.1 g/dL — ABNORMAL LOW (ref 6.5–8.1)

## 2023-09-26 LAB — CBC
HCT: 37 % (ref 36.0–46.0)
Hemoglobin: 11.7 g/dL — ABNORMAL LOW (ref 12.0–15.0)
MCH: 30.3 pg (ref 26.0–34.0)
MCHC: 31.6 g/dL (ref 30.0–36.0)
MCV: 95.9 fL (ref 80.0–100.0)
Platelets: 636 10*3/uL — ABNORMAL HIGH (ref 150–400)
RBC: 3.86 MIL/uL — ABNORMAL LOW (ref 3.87–5.11)
RDW: 13.9 % (ref 11.5–15.5)
WBC: 13.3 10*3/uL — ABNORMAL HIGH (ref 4.0–10.5)
nRBC: 0 % (ref 0.0–0.2)

## 2023-09-26 LAB — URINALYSIS, ROUTINE W REFLEX MICROSCOPIC
Bilirubin Urine: NEGATIVE
Glucose, UA: NEGATIVE mg/dL
Hgb urine dipstick: NEGATIVE
Ketones, ur: NEGATIVE mg/dL
Leukocytes,Ua: NEGATIVE
Nitrite: NEGATIVE
Protein, ur: NEGATIVE mg/dL
Specific Gravity, Urine: 1.016 (ref 1.005–1.030)
pH: 6 (ref 5.0–8.0)

## 2023-09-26 LAB — LIPASE, BLOOD: Lipase: 28 U/L (ref 11–51)

## 2023-09-26 MED ORDER — SODIUM CHLORIDE 0.9 % IV BOLUS
1000.0000 mL | Freq: Once | INTRAVENOUS | Status: AC
  Administered 2023-09-26: 1000 mL via INTRAVENOUS

## 2023-09-26 MED ORDER — KETOROLAC TROMETHAMINE 15 MG/ML IJ SOLN
15.0000 mg | Freq: Once | INTRAMUSCULAR | Status: AC
  Administered 2023-09-26: 15 mg via INTRAVENOUS
  Filled 2023-09-26: qty 1

## 2023-09-26 MED ORDER — ONDANSETRON 4 MG PO TBDP: 4.0000 mg | ORAL_TABLET | Freq: Three times a day (TID) | ORAL | 0 refills | Status: AC | PRN

## 2023-09-26 MED ORDER — IPRATROPIUM-ALBUTEROL 0.5-2.5 (3) MG/3ML IN SOLN
3.0000 mL | Freq: Once | RESPIRATORY_TRACT | Status: AC
  Administered 2023-09-26: 3 mL via RESPIRATORY_TRACT
  Filled 2023-09-26: qty 3

## 2023-09-26 MED ORDER — ONDANSETRON HCL 4 MG/2ML IJ SOLN
4.0000 mg | Freq: Once | INTRAMUSCULAR | Status: AC
  Administered 2023-09-26: 4 mg via INTRAVENOUS
  Filled 2023-09-26: qty 2

## 2023-09-26 NOTE — ED Provider Notes (Signed)
Sheila [name]    Event Date/Time   First MD Initiated Contact with Patient 09/26/23 2024     (approximate)   History   Abdominal Pain   HPI  Sheila [name] is a 62 y.o. female with PMH of hypertension, COPD, tobacco abuse, asthma, osteoporosis presents for evaluation of abdominal pain, nausea, vomiting and diarrhea for 2 days.  Patient is currently being treated for pneumonia on doxycycline.  She reported an improvement in her pneumonia symptoms and then had the development of the GI symptoms.  Denies fever and urinary symptoms.  States she is short of breath but is normally.  She also endorses feeling lightheaded and dizzy.  She was evaluated by her primary care provider earlier today who noticed her blood pressure was a little soft as well as her reports of feeling dizzy and sent her here for IV fluids as she was concerned about dehydration.      Physical Exam   Triage Vital Signs: ED Triage Vitals  Encounter Vitals Group     BP 09/26/23 1629 (!) 95/42     Systolic BP Percentile --      Diastolic BP Percentile --      Pulse Rate 09/26/23 1629 (!) 59     Resp 09/26/23 1629 16     Temp 09/26/23 1629 97.6 F (36.4 C)     Temp Source 09/26/23 1629 Oral     SpO2 09/26/23 1629 94 %     Weight 09/26/23 1635 150 lb 3.2 oz (68.1 kg)     Height 09/26/23 1635 5\' 2"  (1.575 m)     Head Circumference --      Peak Flow --      Pain Score 09/26/23 1635 8     Pain Loc --      Pain Education --      Exclude from Growth Chart --     Most recent vital signs: Vitals:   09/26/23 1629 09/26/23 2049  BP: (!) 95/42 121/82  Pulse: (!) 59 77  Resp: 16 20  Temp: 97.6 F (36.4 C) 97.6 F (36.4 C)  SpO2: 94% 98%   General: Awake, no distress.  CV:  Good peripheral perfusion.  RRR. Resp:  Normal effort.  Bilateral rhonchi. Abd:  No distention.  Soft, nontender, bowel sounds appropriate.   ED Results / Procedures / Treatments   Labs (all labs  ordered are listed, but only abnormal results are displayed) Labs Reviewed  COMPREHENSIVE METABOLIC PANEL - Abnormal; Notable for the following components:      Result Value   BUN 24 (*)    Creatinine, Ser 1.04 (*)    Total Protein 6.1 (*)    Albumin 2.6 (*)    AST 14 (*)    All other components within normal limits  CBC - Abnormal; Notable for the following components:   WBC 13.3 (*)    RBC 3.86 (*)    Hemoglobin 11.7 (*)    Platelets 636 (*)    All other components within normal limits  URINALYSIS, ROUTINE W REFLEX MICROSCOPIC - Abnormal; Notable for the following components:   Color, Urine YELLOW (*)    APPearance CLEAR (*)    All other components within normal limits  LIPASE, BLOOD     PROCEDURES:  Critical Care performed: No  Procedures   MEDICATIONS ORDERED IN ED: Medications  sodium chloride 0.9 % bolus 1,000 mL (1,000 mLs Intravenous Bolus from Bag 09/26/23 2139)  ondansetron (  ZOFRAN) injection 4 mg (4 mg Intravenous Given 09/26/23 2138)  ketorolac (TORADOL) 15 MG/ML injection 15 mg (15 mg Intravenous Given 09/26/23 2139)  ipratropium-albuterol (DUONEB) 0.5-2.5 (3) MG/3ML nebulizer solution 3 mL (3 mLs Nebulization Given 09/26/23 2139)     IMPRESSION / MDM / ASSESSMENT AND PLAN / ED COURSE  I reviewed the triage vital signs and the nursing notes.                             63 year old female presents for evaluation of of abdominal pain, nausea, vomiting and diarrhea.  Patient was hypertensive upon presentation, however blood pressure has normalized at this time.  Vital signs stable otherwise.  Patient NAD and nontoxic-appearing on exam.  Differential diagnosis includes, but is not limited to, medication side effect, viral gastroenteritis, diverticulitis, UTI, bowel obstruction.  Patient's presentation is most consistent with acute complicated illness / injury requiring diagnostic workup.  Urinalysis unremarkable.  CBC shows mild leukocytosis.  Lipase WNL.  CMP  shows slightly elevated BUN and creatinine with decreased total protein and albumin.  Labs are reassuring and patient has no tenderness on abdominal exam.  She describes it as a generalized soreness rather than having areas of specific pain.  She does endorse headache and nausea so I will give her some Toradol and Zofran.  She will also get a liter of fluids.  She had rhonchi on lung auscultation so give her a breathing treatment and see if this improves her shortness of breath.  Discussed CT scan with both the patient and my attending physician, Dr. Cyril Loosen.  Do not feel that 1 is necessary at this time given reassuring labs and physical exam.  Will give patient some fluids and reassess. If improving patient can be discharged.       FINAL CLINICAL IMPRESSION(S) / ED DIAGNOSES   Final diagnoses:  Gastroenteritis     Rx / DC Orders   ED Discharge Orders          Ordered    ondansetron (ZOFRAN-ODT) 4 MG disintegrating tablet  Every 8 hours PRN        09/26/23 2238             [name]:  This document was prepared using Dragon voice recognition software and may include unintentional dictation errors.   Cameron Ali, PA-C 09/26/23 2256    Jene Every, MD 09/30/23 [phone]

## 2023-09-26 NOTE — Progress Notes (Unsigned)
   Established Patient Office Visit  Subjective   Patient ID: Sheila [name], female    DOB: May 26, 1961  Age: 63 y.o. MRN: [id number]  Chief Complaint  Patient presents with  . Medical Management of Chronic Issues  . Diarrhea    X1 days    HPI 63 year old with PAF s/p ablation, NSTEMI, hypertension, COPD. She is lightheaded today when she stands up.  She   started having diarrhea yesterday and also vomiting.  Happened all day long.  She drank water and diet [facility] and she is not much better today.  Admits her mouth is dry she got lightheaded when standing coming out of the lobby.  She is also having some tightness in her chest.  The chest pressure is not bad like she had before, it is lighter.  She denies being short of breath with ambulation.  Does not feel like she is in atrial fibs.  She tells me every time she tried to eat she had vomiting or diarrhea.  Blood pressure today 70/45 initial blood pressure was 83/55.  O2 sats 90% on room air.  Advise she is can have to go to the hospital for fluids.  {History (Optional):23778}  ROS    Objective:     BP (!) 83/55 (BP Location: Right Arm, Patient Position: Sitting, Cuff Size: Normal)   Pulse 65   Temp (!) 97.3 F (36.3 C) (Oral)   Resp 16   Ht 5\' 2"  (1.575 m)   Wt 150 lb 3.2 oz (68.1 kg)   LMP 08/09/2008 (Within Years)   SpO2 90%   BMI 27.47 kg/m  {Vitals History (Optional):23777}  Physical Exam Vitals and nursing note reviewed.  Constitutional:      Appearance: Normal appearance.  HENT:     Head: Normocephalic and atraumatic.  Eyes:     Conjunctiva/sclera: Conjunctivae normal.  Cardiovascular:     Rate and Rhythm: Normal rate and regular rhythm.  Pulmonary:     Breath sounds: Wheezing present.  Musculoskeletal:     Right lower leg: No edema.     Left lower leg: No edema.  Skin:    General: Skin is warm and dry.  Neurological:     Mental Status: She is alert and oriented to person, place, and time.   Psychiatric:        Mood and Affect: Mood normal.        Behavior: Behavior normal.        Thought Content: Thought content normal.        Judgment: Judgment normal.     No results found for any visits on 09/26/23.  {Labs (Optional):23779}  The ASCVD Risk score (Arnett DK, et al., 2019) failed to calculate for the following reasons:   Risk score cannot be calculated because patient has a medical history suggesting prior/existing ASCVD    Assessment & Plan:   Problem List Items Addressed This Visit   None   No follow-ups on file.    Alease Medina, MD

## 2023-09-26 NOTE — ED Provider Triage Note (Signed)
Emergency Medicine Provider Triage Evaluation Note  Sheila [name] , a 63 y.o. female  was evaluated in triage.  Pt complains of abdominal pain, vomiting, and diarrhea x 2 days. Sent to ER by PCP for possible dehydration. Physical Exam  BP (!) 95/42 (BP Location: Right Arm)   Pulse (!) 59   Temp 97.6 F (36.4 C) (Oral)   Resp 16   Ht 5\' 2"  (1.575 m)   Wt 68.1 kg   LMP 08/09/2008 (Within Years)   SpO2 94%   BMI 27.47 kg/m  Gen:   Awake, no distress   Resp:  Normal effort  MSK:   Moves extremities without difficulty  Other:    Medical Decision Making  Medically screening exam initiated at 4:37 PM.  Appropriate orders placed.  Sheila [name] was informed that the remainder of the evaluation will be completed by another provider, this initial triage assessment does not replace that evaluation, and the importance of remaining in the ED until their evaluation is complete.  Flu and pneumonia last week.    Chinita Pester, FNP 09/26/23 [phone]

## 2023-09-26 NOTE — ED Triage Notes (Addendum)
Pt comes with c/o vomiting and belly pain for about 2 days. Pt sent her for possible dehydration.  Pt dx with flu and pneumonia last week and placed on med.s

## 2023-09-27 DIAGNOSIS — E860 Dehydration: Secondary | ICD-10-CM | POA: Insufficient documentation

## 2023-09-27 NOTE — Assessment & Plan Note (Signed)
He has a history of demand ischemia and she may be doing this again today.  Advise she needs to go to the hospital for evaluation and fluids.

## 2023-09-27 NOTE — Assessment & Plan Note (Signed)
She has had vomiting and diarrhea for 2 days and inadequate fluid intake.  She has signs and symptoms of orthostatic hypotension.  Advise she needs fluids encouraged her to take EMS to the ED but she refused she prefers to travel by private automobile her daughter will be driving.

## 2023-10-09 NOTE — Progress Notes (Unsigned)
 Cardiology Office Note Date:  10/10/2023  Patient ID:  Sheila [name], Sheila [name] [birth year], MRN [id number] PCP:  Alease Medina, MD  Cardiologist:  Julien Nordmann, MD Electrophysiologist: Lanier Prude, MD     Chief Complaint: AF, SVT follow-up  History of Present Illness: LIBERTI APPLETON is a 63 y.o. female with PMH notable for afib, aflutter, SVT, HTN, HFpEF, PAD s/p subclavian stenting, COPD, OSA, tobacco use; seen today for Lanier Prude, MD for routine electrophysiology followup.  She has been on amiodarone for her AF, though had recommended ablation in hopes of stopping amio d/t young age.   She is s/p aborted AF ablation 2/3 d/t severely thickened intraatrial septum. cMRI has been recommended to further eval, but has not been completed yet. Amiodarone was stopped.   On follow-up today, she has rare episodes of palpitations, episodes tend to be brief in duration. She recently had flu, PNA, and GI illness all at the same time and did have increase in AFib at that time, but has full recovered since then.  She reports bilat groins are well-healed.  She continues to take eliquis BID, no bleeding concerns.     AAD History: Amiodarone     ROS:  Please see the history of present illness. All other systems are reviewed and otherwise negative.   PHYSICAL EXAM:  VS:  BP 132/72 (BP Location: Right Arm, Patient Position: Sitting)   Pulse 76   Ht 5\' 2"  (1.575 m)   Wt 150 lb 12.8 oz (68.4 kg)   LMP 08/09/2008 (Within Years)   SpO2 96%   BMI 27.58 kg/m  BMI: Body mass index is 27.58 kg/m.  Wt Readings from Last 3 Encounters:  10/10/23 150 lb 12.8 oz (68.4 kg)  09/26/23 150 lb 3.2 oz (68.1 kg)  09/26/23 150 lb 3.2 oz (68.1 kg)    GEN- The patient is well appearing, alert and oriented x 3 today.   Lungs- Clear to ausculation bilaterally, normal work of breathing.  Heart- Regular rate and rhythm, no murmurs, rubs or gallops Extremities- No peripheral edema, warm,  dry   EKG is not ordered. Personal review of EKG from  09/11/2022  shows:  SR at 68bpm; LAFB         Additional studies reviewed include: Previous EP, cardiology notes.   Long term monitor, 01/25/2023 Patient had a min HR of 40 bpm, max HR of 279 bpm, and avg HR of 74 bpm.   352 Supraventricular Tachycardia runs occurred, the run with the fastest interval lasting 10.2 secs with a max rate of 279 bpm, the longest lasting 59.9 secs with an avg rate of 179 bpm. Episodes of Atrial tachycardia with possible aberrancy.  Supraventricular Tachycardia was detected within +/- 45 seconds of symptomatic patient event(s).    Isolated SVEs were rare (<1.0%), SVE Couplets were rare (<1.0%), and SVE Triplets were rare (<1.0%).  Isolated VEs were rare (<1.0%), and no VE Couplets or VE Triplets were present.  LHC, 11/29/2022 1.  Normal coronary arteries. 2.  Left ventricular angiography was not performed.  EF was normal by echo. 3.  Severely elevated systolic blood pressure with mildly elevated left ventricular end-diastolic pressure.  TTE, 11/28/2022  1. Left ventricular ejection fraction, by estimation, is 55 to 60%. The left ventricle has normal function. The left ventricle has no regional wall motion abnormalities. There is mild left ventricular hypertrophy. Left ventricular diastolic parameters are consistent with Grade II diastolic dysfunction (pseudonormalization).   2. Right ventricular  systolic function is normal. The right ventricular size is normal.   3. The mitral valve is normal in structure. No evidence of mitral valve regurgitation.   4. The aortic valve was not well visualized. Aortic valve regurgitation is not visualized. Aortic valve sclerosis is present, with no evidence of aortic valve stenosis.   5. The inferior vena cava is normal in size with greater than 50% respiratory variability, suggesting right atrial pressure of 3 mmHg.    ASSESSMENT AND PLAN:  #) parox AFib #)  aflutter #) wide-complex tachycardia, ?SVT Continues to brief palpitation episodes Recently stopped amiodarone Recent TSH and LFT labs stable Continue coreg 6.25mg  BID cMRI scheduled  #) Hypercoag d/t aprox afib CHA2DS2-VASc Score = 4 [CHF History: 1, HTN History: 1, Diabetes History: 0, Stroke History: 0, Vascular Disease History: 1, Age Score: 0, Gender Score: 1].  Therefore, the patient's annual risk of stroke is 4.8 %.    Stroke ppx - 5mg  eliquis BID, appropriately dosed No bleeding concerns  #) COPD #) tobacco use She is down to 1/2 ppd cigarettes Encouraged tobacco cessation        Current medicines are reviewed at length with the patient today.   The patient does not have concerns regarding her medicines.  The following changes were made today:  none  Labs/ tests ordered today include:  No orders of the defined types were placed in this encounter.    Disposition: Follow up with Dr. Lalla Brothers  after cardiac MRI    Signed, Sherie Don, NP  10/10/23  11:15 AM  Electrophysiology [facility]

## 2023-10-10 ENCOUNTER — Ambulatory Visit: Payer: Medicaid Other | Attending: Cardiology | Admitting: Cardiology

## 2023-10-10 VITALS — BP 132/72 | HR 76 | Ht 62.0 in | Wt 150.8 lb

## 2023-10-10 DIAGNOSIS — I480 Paroxysmal atrial fibrillation: Secondary | ICD-10-CM | POA: Diagnosis not present

## 2023-10-10 DIAGNOSIS — I4892 Unspecified atrial flutter: Secondary | ICD-10-CM | POA: Diagnosis not present

## 2023-10-10 DIAGNOSIS — D6869 Other thrombophilia: Secondary | ICD-10-CM

## 2023-10-10 DIAGNOSIS — Z720 Tobacco use: Secondary | ICD-10-CM

## 2023-10-10 NOTE — Patient Instructions (Signed)
 Medication Instructions:  The current medical regimen is effective;  continue present plan and medications.  *If you need a refill on your cardiac medications before your next appointment, please call your pharmacy*   Follow-Up: At [facility], you and your health needs are our priority.  As part of our continuing mission to provide you with exceptional heart care, we have created designated Provider Care Teams.  These Care Teams include your primary Cardiologist (physician) and Advanced Practice Providers (APPs -  Physician Assistants and Nurse Practitioners) who all work together to provide you with the care you need, when you need it.  We recommend signing up for the patient portal called "MyChart".  Sign up information is provided on this After Visit Summary.  MyChart is used to connect with patients for Virtual Visits (Telemedicine).  Patients are able to view lab/test results, encounter notes, upcoming appointments, etc.  Non-urgent messages can be sent to your provider as well.   To learn more about what you can do with MyChart, go to [email or web address].    Your next appointment:   April   Provider:   Steffanie Dunn, MD

## 2023-10-19 ENCOUNTER — Encounter: Payer: Self-pay | Admitting: Family Medicine

## 2023-10-19 ENCOUNTER — Ambulatory Visit: Payer: Medicaid Other | Admitting: Family Medicine

## 2023-10-19 VITALS — BP 170/102 | HR 55 | Temp 97.8°F | Resp 18 | Ht 62.0 in | Wt 150.0 lb

## 2023-10-19 DIAGNOSIS — E785 Hyperlipidemia, unspecified: Secondary | ICD-10-CM | POA: Diagnosis not present

## 2023-10-19 DIAGNOSIS — G8929 Other chronic pain: Secondary | ICD-10-CM

## 2023-10-19 DIAGNOSIS — M545 Low back pain, unspecified: Secondary | ICD-10-CM

## 2023-10-19 DIAGNOSIS — E559 Vitamin D deficiency, unspecified: Secondary | ICD-10-CM

## 2023-10-19 DIAGNOSIS — I10 Essential (primary) hypertension: Secondary | ICD-10-CM

## 2023-10-19 DIAGNOSIS — T7840XD Allergy, unspecified, subsequent encounter: Secondary | ICD-10-CM

## 2023-10-19 DIAGNOSIS — I2489 Other forms of acute ischemic heart disease: Secondary | ICD-10-CM

## 2023-10-19 DIAGNOSIS — J45909 Unspecified asthma, uncomplicated: Secondary | ICD-10-CM

## 2023-10-19 DIAGNOSIS — I4891 Unspecified atrial fibrillation: Secondary | ICD-10-CM

## 2023-10-19 DIAGNOSIS — J301 Allergic rhinitis due to pollen: Secondary | ICD-10-CM

## 2023-10-19 DIAGNOSIS — M810 Age-related osteoporosis without current pathological fracture: Secondary | ICD-10-CM | POA: Insufficient documentation

## 2023-10-19 DIAGNOSIS — M818 Other osteoporosis without current pathological fracture: Secondary | ICD-10-CM

## 2023-10-19 DIAGNOSIS — R195 Other fecal abnormalities: Secondary | ICD-10-CM

## 2023-10-19 MED ORDER — ALENDRONATE SODIUM 70 MG PO TABS: 70.0000 mg | ORAL_TABLET | ORAL | 11 refills | Status: DC

## 2023-10-19 MED ORDER — CETIRIZINE HCL 10 MG PO TABS: 10.0000 mg | ORAL_TABLET | Freq: Every day | ORAL | 11 refills | Status: AC

## 2023-10-19 MED ORDER — ATORVASTATIN CALCIUM 40 MG PO TABS: 40.0000 mg | ORAL_TABLET | Freq: Every day | ORAL | 1 refills | Status: DC

## 2023-10-19 MED ORDER — AMLODIPINE BESYLATE 10 MG PO TABS: 10.0000 mg | ORAL_TABLET | Freq: Every day | ORAL | 11 refills | Status: AC

## 2023-10-19 MED ORDER — OXYCODONE-ACETAMINOPHEN 5-325 MG PO TABS: 1.0000 | ORAL_TABLET | Freq: Four times a day (QID) | ORAL | 0 refills | Status: DC | PRN

## 2023-10-19 NOTE — Assessment & Plan Note (Signed)
 Restarting alendronate 70 mg weekly.  Please take vitamin D3 5000 IU daily.  Will check vitamin D level next month.

## 2023-10-19 NOTE — Assessment & Plan Note (Signed)
 Advise she needs to start vitamin D3 5000 IU daily.  Will check level next month.

## 2023-10-19 NOTE — Progress Notes (Signed)
 Established Patient Office Visit  Subjective   Patient ID: Sheila [name], female    DOB: 12/09/1960  Age: 62 y.o. MRN: [id number]  Chief Complaint  Patient presents with   Follow-up   Pneumonia    Pneumonia   63 year old with A-fib/flutter/SVT, HTN, HFpEF, PAD s/p subclavian stent, COPD, OSA (untreated), nicotine addiction and osteoporosis (T lumbar equals -2.6, compression fractures T8, T9, L2), vitamin D deficiency.  Cardiology has stopped her amiodarone but she continues on Eliquis.  Her ablation was aborted secondary to severely thickened intra-atrial septum.  A cardiac MRI has been recommended.  She has been having a few minutes of atrial fibrillation from time to time if it lasts longer than a few minutes or she breaks out in a sweat or develops any chest pain and she goes to the ER. She had a positive Cologuard but cannot undergo anesthesia at this time per cardiology.   She has osteoporosis and had hyperparathyroidism and is now s/p parathyroidectomy.  She is supposed to be taking alendronate 70 mg once a week but is not doing that. She is having a hard time with her allergies and has been taking Mucinex which is not helping. Blood pressure is very elevated although she is compliant with Coreg 6.25 mg twice daily.  She denies chest pains, PND, orthopnea, peripheral edema.    ROS    Objective:     BP (!) 170/102 (BP Location: Right Arm, Patient Position: Sitting, Cuff Size: Normal)   Pulse (!) 55   Temp 97.8 F (36.6 C) (Oral)   Resp 18   Ht 5\' 2"  (1.575 m)   Wt 150 lb (68 kg)   LMP 08/09/2008 (Within Years)   SpO2 96%   BMI 27.44 kg/m    Physical Exam Vitals and nursing note reviewed.  Constitutional:      Appearance: Normal appearance.  HENT:     Head: Normocephalic and atraumatic.  Eyes:     Conjunctiva/sclera: Conjunctivae normal.  Cardiovascular:     Rate and Rhythm: Normal rate and regular rhythm.  Pulmonary:     Effort: Pulmonary effort is  normal.     Breath sounds: Normal breath sounds.  Musculoskeletal:     Right lower leg: No edema.     Left lower leg: No edema.  Skin:    General: Skin is warm and dry.  Neurological:     Mental Status: She is alert and oriented to person, place, and time.  Psychiatric:        Mood and Affect: Mood normal.        Behavior: Behavior normal.        Thought Content: Thought content normal.        Judgment: Judgment normal.          No results found for any visits on 10/19/23.    The ASCVD Risk score (Arnett DK, et al., 2019) failed to calculate for the following reasons:   Risk score cannot be calculated because patient has a medical history suggesting prior/existing ASCVD    Assessment & Plan:  Hyperlipidemia, unspecified hyperlipidemia type -     Atorvastatin Calcium; Take 1 tablet (40 mg total) by mouth at bedtime.  Dispense: 90 tablet; Refill: 1  Chronic bilateral low back pain without sciatica -     oxyCODONE-Acetaminophen; Take 1 tablet by mouth every 6 (six) hours as needed for severe pain (pain score 7-10).  Dispense: 12 tablet; Refill: 0  Other osteoporosis without current pathological fracture  Assessment & Plan: Restarting alendronate 70 mg weekly.  Please take vitamin D3 5000 IU daily.  Will check vitamin D level next month.  Orders: -     Alendronate Sodium; Take 1 tablet (70 mg total) by mouth every 7 (seven) days.  Dispense: 4 tablet; Refill: 11  Primary hypertension -     amLODIPine Besylate; Take 1 tablet (10 mg total) by mouth daily.  Dispense: 30 tablet; Refill: 11  Demand ischemia (HCC)  Atrial fibrillation with rapid ventricular response (HCC) Assessment & Plan: Now off amiodarone given her young age.  Having intermittent episodes of atrial fibrillation that are or several minutes.  If she develops any ischemic symptoms, nausea, diaphoresis, dizziness, presyncope, chest pain then she goes to the emergency room.   Asthma due to seasonal  allergies  Allergy, subsequent encounter  Seasonal allergic rhinitis due to pollen Assessment & Plan: Allergies are really bothering her we will send in a prescription for Zyrtec   Essential hypertension Assessment & Plan: Have stopped amiodarone due to her young age.  Now blood pressure is very elevated in the 170s over 90s to 105.  On Coreg 6.25 mg twice daily and heart rate 55.  Adding Norvasc 10 mg today.Follow-up next month for recheck of blood pressure   Positive colorectal cancer screening using Cologuard test  Vitamin D deficiency Assessment & Plan: Advise she needs to start vitamin D3 5000 IU daily.  Will check level next month.      Return in about 4 weeks (around 11/16/2023).    Alease Medina, MD

## 2023-10-19 NOTE — Assessment & Plan Note (Signed)
 Now off amiodarone given her young age.  Having intermittent episodes of atrial fibrillation that are or several minutes.  If she develops any ischemic symptoms, nausea, diaphoresis, dizziness, presyncope, chest pain then she goes to the emergency room.

## 2023-10-19 NOTE — Assessment & Plan Note (Signed)
 Have stopped amiodarone due to her young age.  Now blood pressure is very elevated in the 170s over 90s to 105.  On Coreg 6.25 mg twice daily and heart rate 55.  Adding Norvasc 10 mg today.Follow-up next month for recheck of blood pressure

## 2023-10-19 NOTE — Assessment & Plan Note (Signed)
 Allergies are really bothering her we will send in a prescription for Zyrtec

## 2023-10-26 ENCOUNTER — Other Ambulatory Visit (HOSPITAL_COMMUNITY): Payer: Medicaid Other

## 2023-10-27 ENCOUNTER — Encounter (HOSPITAL_COMMUNITY): Payer: Self-pay

## 2023-10-31 ENCOUNTER — Other Ambulatory Visit: Payer: Self-pay | Admitting: Cardiology

## 2023-10-31 ENCOUNTER — Ambulatory Visit (HOSPITAL_COMMUNITY)
Admission: RE | Admit: 2023-10-31 | Discharge: 2023-10-31 | Disposition: A | Discharge status: Home / Self Care | Payer: Medicaid Other | Source: Ambulatory Visit | Attending: Cardiology | Admitting: Cardiology

## 2023-10-31 DIAGNOSIS — I4892 Unspecified atrial flutter: Secondary | ICD-10-CM

## 2023-10-31 DIAGNOSIS — I5032 Chronic diastolic (congestive) heart failure: Secondary | ICD-10-CM

## 2023-10-31 MED ORDER — GADOBUTROL 1 MMOL/ML IV SOLN
10.0000 mL | Freq: Once | INTRAVENOUS | Status: AC | PRN
Start: 2023-10-31 — End: 2023-10-31
  Administered 2023-10-31: 10 mL via INTRAVENOUS

## 2023-11-08 NOTE — Progress Notes (Unsigned)
  Electrophysiology Office Follow up Visit Note:    Date:  11/09/2023   ID:  Sheila [name], DOB May 19, 1961, MRN [id number]  PCP:  Sheila Medina, MD  [facility] Cardiologist:  Sheila Nordmann, MD  [facility] Electrophysiologist:  Sheila Prude, MD    Interval History:     Sheila [name] is a 63 y.o. female who presents for a follow up visit.   The patient last saw Sheila [name] October 10, 2023.  She has a history of SVT, atrial fibrillation and flutter, hypertension, diastolic heart failure, COPD, sleep apnea, tobacco abuse.  She has previously been on amiodarone for her atrial arrhythmias.  Her ablation had to be aborted on July 11, 2024 because of a severely hypertrophied interatrial septum prohibiting safe transseptal crossing.  Amiodarone was stopped at that time.  She was on Eliquis for stroke prophylaxis.  She is doing well today. Havinig episodes of AF 2-3 times per month. Usually in sinus rhythm. No problems with the OAC.      Past medical, surgical, social and family history were reviewed.  ROS:   Please see the history of present illness.    All other systems reviewed and are negative.  EKGs/Labs/Other Studies Reviewed:    The following studies were reviewed today:  October 31, 2023 cardiac MRI showed severe lipomatous hypertrophy of the interatrial septum.  Normal biventricular size and function.  No significant valvular heart disease.        Physical Exam:    VS:  BP (!) 150/90 (BP Location: Right Arm, Patient Position: Sitting, Cuff Size: Normal)   Pulse (!) 58   Ht 5\' 2"  (1.575 m)   Wt 150 lb 4 oz (68.2 kg)   LMP 08/09/2008 (Within Years)   BMI 27.48 kg/m     Wt Readings from Last 3 Encounters:  11/09/23 150 lb 4 oz (68.2 kg)  10/19/23 150 lb (68 kg)  10/10/23 150 lb 12.8 oz (68.4 kg)     GEN: no distress CARD: RRR, No MRG RESP: No IWOB. CTAB.      ASSESSMENT:    1. Atrial flutter, unspecified type (HCC)   2. PAF (paroxysmal atrial  fibrillation) (HCC)   3. Essential hypertension    PLAN:    In order of problems listed above:  #Persistent atrial fibrillation Previously on amiodarone.  Aborted catheter ablation attempt earlier this year because of severe lipomatous hypertrophy of the interatrial septum.  Currently on Eliquis for stroke prophylaxis.  I would continue to avoid amiodarone for now.  If we need to antiarrhythmic drug in the future, consider Tikosyn.  Her creatinine is acceptable for initiating Tikosyn but her last QTc on a February EKG was at the upper range for Tikosyn (480s).  Would need to see this decrease as Amio washes out in order to safely start Tikosyn.  #Interatrial septal hypertrophy #Lipomatous hypertrophy of the interatrial septum Prohibits safe transseptal access Discussed MRI results in detail during today's visit.   #Hypertension Above goal today.  Recommend checking blood pressures 1-2 times per week at home and recording the values.  Recommend bringing these recordings to the primary care physician. Increase coreg to 12.5mg  PO BID.  Follow-up with EP APP in 6 months. Get ECG at that appt to reassess QTc.    Signed, Sheila Dunn, MD, [facility], [facility] 11/09/2023 9:49 AM    Electrophysiology [facility]

## 2023-11-09 ENCOUNTER — Encounter: Payer: Self-pay | Admitting: Cardiology

## 2023-11-09 ENCOUNTER — Ambulatory Visit: Attending: Cardiology | Admitting: Cardiology

## 2023-11-09 VITALS — BP 150/90 | HR 58 | Ht 62.0 in | Wt 150.2 lb

## 2023-11-09 DIAGNOSIS — I10 Essential (primary) hypertension: Secondary | ICD-10-CM

## 2023-11-09 DIAGNOSIS — I480 Paroxysmal atrial fibrillation: Secondary | ICD-10-CM

## 2023-11-09 DIAGNOSIS — I4892 Unspecified atrial flutter: Secondary | ICD-10-CM

## 2023-11-09 MED ORDER — CARVEDILOL 12.5 MG PO TABS: 12.5000 mg | ORAL_TABLET | Freq: Two times a day (BID) | ORAL | 3 refills | Status: DC

## 2023-11-09 NOTE — Patient Instructions (Signed)
 Medication Instructions:  Your physician has recommended you make the following change in your medication:  1) INCREASE Coreg (carvedilol) to 12.5 mg twice daily  *If you need a refill on your cardiac medications before your next appointment, please call your pharmacy*  Follow-Up: At [facility], you and your health needs are our priority.  As part of our continuing mission to provide you with exceptional heart care, our providers are all part of one team.  This team includes your primary Cardiologist (physician) and Advanced Practice Providers or APPs (Physician Assistants and Nurse Practitioners) who all work together to provide you with the care you need, when you need it.  Your next appointment:   6 months  Provider:   You will see one of the following Advanced Practice Providers on your designated Care Team:   Francis Dowse, Charlott Holler [address] [place], [place] Sherie Don, NP Canary Brim, NP

## 2023-11-16 ENCOUNTER — Ambulatory Visit
Admission: RE | Admit: 2023-11-16 | Discharge: 2023-11-16 | Disposition: A | Discharge status: Home / Self Care | Attending: Family Medicine | Admitting: Family Medicine

## 2023-11-16 ENCOUNTER — Ambulatory Visit: Admitting: Family Medicine

## 2023-11-16 ENCOUNTER — Other Ambulatory Visit: Payer: Self-pay | Admitting: Family Medicine

## 2023-11-16 ENCOUNTER — Ambulatory Visit
Admission: RE | Admit: 2023-11-16 | Discharge: 2023-11-16 | Disposition: A | Discharge status: Home / Self Care | Source: Ambulatory Visit | Attending: Family Medicine | Admitting: Family Medicine

## 2023-11-16 ENCOUNTER — Encounter: Payer: Self-pay | Admitting: Family Medicine

## 2023-11-16 ENCOUNTER — Telehealth: Payer: Self-pay | Admitting: Family Medicine

## 2023-11-16 VITALS — BP 150/91 | HR 58 | Temp 97.5°F | Resp 18 | Ht 62.0 in | Wt 150.0 lb

## 2023-11-16 DIAGNOSIS — R0781 Pleurodynia: Secondary | ICD-10-CM

## 2023-11-16 DIAGNOSIS — K2100 Gastro-esophageal reflux disease with esophagitis, without bleeding: Secondary | ICD-10-CM

## 2023-11-16 DIAGNOSIS — I10 Essential (primary) hypertension: Secondary | ICD-10-CM | POA: Diagnosis not present

## 2023-11-16 DIAGNOSIS — K219 Gastro-esophageal reflux disease without esophagitis: Secondary | ICD-10-CM | POA: Diagnosis not present

## 2023-11-16 DIAGNOSIS — R195 Other fecal abnormalities: Secondary | ICD-10-CM

## 2023-11-16 DIAGNOSIS — E876 Hypokalemia: Secondary | ICD-10-CM | POA: Diagnosis not present

## 2023-11-16 DIAGNOSIS — M8080XD Other osteoporosis with current pathological fracture, unspecified site, subsequent encounter for fracture with routine healing: Secondary | ICD-10-CM

## 2023-11-16 DIAGNOSIS — I4891 Unspecified atrial fibrillation: Secondary | ICD-10-CM

## 2023-11-16 MED ORDER — OMEPRAZOLE 40 MG PO CPDR
40.0000 mg | DELAYED_RELEASE_CAPSULE | Freq: Every day | ORAL | 1 refills | Status: DC
Start: 2023-11-16 — End: 2023-12-14

## 2023-11-16 MED ORDER — OXYCODONE-ACETAMINOPHEN 2.5-325 MG PO TABS: 1.0000 | ORAL_TABLET | ORAL | 0 refills | Status: DC | PRN

## 2023-11-16 MED ORDER — POTASSIUM CHLORIDE CRYS ER 20 MEQ PO TBCR: 20.0000 meq | EXTENDED_RELEASE_TABLET | Freq: Two times a day (BID) | ORAL | 3 refills | Status: AC

## 2023-11-16 MED ORDER — OXYCODONE-ACETAMINOPHEN 5-325 MG PO TABS: 1.0000 | ORAL_TABLET | Freq: Three times a day (TID) | ORAL | 0 refills | Status: DC | PRN

## 2023-11-16 MED ORDER — CARVEDILOL 12.5 MG PO TABS: 12.5000 mg | ORAL_TABLET | Freq: Two times a day (BID) | ORAL | 3 refills | Status: AC

## 2023-11-16 NOTE — Assessment & Plan Note (Signed)
 T= -2.6, Hx compression fractures T8, T9 and L1.  Had hyperparathyroidism now s/p parathyroidectomy.  Not tolerating Fosamax 2/2 GERD.  Will refer to Endo for an injectable treatment.

## 2023-11-16 NOTE — Assessment & Plan Note (Signed)
 No antiarrhythmia therapy currently.  Was taking amiodarone.  He is rate controlled with Coreg 12.5 mg twice daily.  Currently having 2-3 short bouts of AFIB monthly.  She understands that she becomes symptomatic she needs to go to the ER.

## 2023-11-16 NOTE — Assessment & Plan Note (Signed)
 Better control with entresto 49/51, Coreg 12.5mg  BID and amlodipine 10mg  daily Please continue medications

## 2023-11-16 NOTE — Telephone Encounter (Signed)
 Copied from CRM [id number]. Topic: Clinical - Prescription Issue >> Nov 16, 2023 12:11 PM Fonda Kinder J wrote: Reason for CRM: Thayer Ohm from [facility] stated that they don't have  oxycodone-acetaminophen (PERCOCET) 2.5-325 MG tablet in stock and they are unable to order it. He said that they the 5/325 mg if Dr.Ziglar wants to resend a prescription for that and adjust the directions, otherwise they are unable to fill this medication

## 2023-11-16 NOTE — Assessment & Plan Note (Signed)
 Takes omeprazole 40mg  daily but still has GERD.  Unable to tolerate Fosamax.

## 2023-11-16 NOTE — Assessment & Plan Note (Signed)
 Has a positive Cologuard but cardiology has asked that she not undergo anesthesia until her atrial fibrillation is fully controlled.

## 2023-11-16 NOTE — Progress Notes (Addendum)
 Established Patient Office Visit  Subjective   Patient ID: Sheila [name], female    DOB: 1961/03/19  Age: 63 y.o. MRN: [id number]  Chief Complaint  Patient presents with   Rib Injury    Right side    HPI Sheila [name] 63 year old with SVT/paroxsymal AFIB/flutter, HTN, HFpEF, COPD (still smoking), GERD, OSA (untreated), PAD (subclavian occlusion) nicotine  addiction, osteoporosis (T equals -2.6, compression fracture T8, T9 and L2) vitamin D  deficiency, s/p parathyroidectomy. Just saw cardiology and for now she is not on an antiarrhythmic, they stopped amiodarone  based on her young age.  She is rate controlled with Coreg  12.5 mg twice daily.  She can feel her atrial fibrillation and if she develops symptoms like chest pain, shortness of breath, diaphoresis, dizziness she goes to the emergency room.  She is having episodes of atrial fibrillation 2-3 times a month but they have been short-lived and without symptoms. She is on amlodipine  10 mg Coreg  12.5 mg and Entresto  49/51 twice daily.  Her blood pressure is better controlled.  She denies peripheral edema, PND, orthopnea or shortness of breath with ambulation. She is still smoking.  She is not ready to quit. She was cleaning out the tub yesterday and had her arms extended.  Felt something snap in her right rib cage and she has been in significant pain since then.  She ranks her pain is 8 out of 10.  She can get in a position that does not hurt but breathing and using her arms is painful. She tried taking Fosamax  on an empty stomach and had nausea and a stomachache for 3 days.  She does not want to take Fosamax  any longer.    ROS    Objective:     BP (!) 150/91 (BP Location: Right Arm, Patient Position: Sitting)   Pulse (!) 58   Temp (!) 97.5 F (36.4 C) (Oral)   Resp 18   Ht 5\' 2"  (1.575 m)   Wt 150 lb (68 kg)   LMP 08/09/2008 (Within Years)   SpO2 94%   BMI 27.44 kg/m    Physical Exam Vitals and nursing note reviewed.   Constitutional:      Appearance: Normal appearance.  HENT:     Head: Normocephalic and atraumatic.  Eyes:     Conjunctiva/sclera: Conjunctivae normal.  Cardiovascular:     Rate and Rhythm: Normal rate and regular rhythm.  Pulmonary:     Effort: Pulmonary effort is normal.     Breath sounds: Normal breath sounds.  Musculoskeletal:     Right lower leg: No edema.     Left lower leg: No edema.  Skin:    General: Skin is warm and dry.     Comments: Tender to palpation right ribs, around rib 8 or 9.    Neurological:     Mental Status: She is alert and oriented to person, place, and time.  Psychiatric:        Mood and Affect: Mood normal.        Behavior: Behavior normal.        Thought Content: Thought content normal.        Judgment: Judgment normal.          No results found for any visits on 11/16/23.    The ASCVD Risk score (Arnett DK, et al., 2019) failed to calculate for the following reasons:   Risk score cannot be calculated because patient has a medical history suggesting prior/existing ASCVD    Assessment & Plan:  Primary hypertension -     Carvedilol ; Take 1 tablet (12.5 mg total) by mouth 2 (two) times daily.  Dispense: 180 tablet; Refill: 3  GERD without esophagitis -     Omeprazole ; Take 1 capsule (40 mg total) by mouth daily. Hold until finished with Protonix .  Dispense: 90 capsule; Refill: 1  Hypokalemia -     Potassium Chloride  Crys ER; Take 1 tablet (20 mEq total) by mouth 2 (two) times daily.  Dispense: 90 tablet; Refill: 3  Rib pain on right side -     DG Ribs Unilateral Right; Future -     oxyCODONE -Acetaminophen ; Take 1 tablet by mouth every 4 (four) hours as needed for pain.  Dispense: 30 tablet; Refill: 0  Other osteoporosis with current pathological fracture with routine healing, subsequent encounter Assessment & Plan: T= -2.6, Hx compression fractures T8, T9 and L1.  Had hyperparathyroidism now s/p parathyroidectomy.  Not tolerating Fosamax   2/2 GERD.  Will refer to Endo for an injectable treatment.    Orders: -     Ambulatory referral to Endocrinology  Positive colorectal cancer screening using Cologuard test Assessment & Plan: Has a positive Cologuard but cardiology has asked that she not undergo anesthesia until her atrial fibrillation is fully controlled.    Essential hypertension Assessment & Plan: Better control with entresto  49/51, Coreg  12.5mg  BID and amlodipine  10mg  daily Please continue medications   Gastroesophageal reflux disease with esophagitis without hemorrhage Assessment & Plan: Takes omeprazole  40mg  daily but still has GERD.  Unable to tolerate Fosamax .     Atrial fibrillation with rapid ventricular response (HCC) Assessment & Plan: No antiarrhythmia therapy currently.  Was taking amiodarone .  He is rate controlled with Coreg  12.5 mg twice daily.  Currently having 2-3 short bouts of AFIB monthly.  She understands that she becomes symptomatic she needs to go to the ER.      No follow-ups on file.    Sheila [name] Sheila Matheney, MD

## 2023-11-18 ENCOUNTER — Encounter: Payer: Self-pay | Admitting: Family Medicine

## 2023-12-12 ENCOUNTER — Ambulatory Visit: Payer: Medicaid Other | Admitting: Cardiology

## 2023-12-14 ENCOUNTER — Ambulatory Visit: Admitting: Family Medicine

## 2023-12-14 ENCOUNTER — Encounter: Payer: Self-pay | Admitting: Family Medicine

## 2023-12-14 VITALS — BP 136/87 | HR 57 | Temp 97.3°F | Resp 18 | Ht 62.0 in | Wt 146.0 lb

## 2023-12-14 DIAGNOSIS — N3001 Acute cystitis with hematuria: Secondary | ICD-10-CM | POA: Diagnosis not present

## 2023-12-14 DIAGNOSIS — R1013 Epigastric pain: Secondary | ICD-10-CM

## 2023-12-14 DIAGNOSIS — F419 Anxiety disorder, unspecified: Secondary | ICD-10-CM | POA: Diagnosis not present

## 2023-12-14 DIAGNOSIS — R300 Dysuria: Secondary | ICD-10-CM

## 2023-12-14 DIAGNOSIS — R0781 Pleurodynia: Secondary | ICD-10-CM

## 2023-12-14 LAB — POCT URINALYSIS DIP (CLINITEK)
Bilirubin, UA: NEGATIVE
Glucose, UA: NEGATIVE mg/dL
Ketones, POC UA: NEGATIVE mg/dL
Nitrite, UA: NEGATIVE
POC PROTEIN,UA: NEGATIVE
Spec Grav, UA: <=1.005 — AB (ref 1.010–1.025)
Urobilinogen, UA: 0.2 U/dL
pH, UA: 5 (ref 5.0–8.0)

## 2023-12-14 MED ORDER — PANTOPRAZOLE SODIUM 40 MG PO TBEC: 40.0000 mg | DELAYED_RELEASE_TABLET | Freq: Every day | ORAL | 3 refills | Status: AC

## 2023-12-14 MED ORDER — OXYCODONE-ACETAMINOPHEN 5-325 MG PO TABS: 1.0000 | ORAL_TABLET | Freq: Three times a day (TID) | ORAL | 0 refills | Status: DC | PRN

## 2023-12-14 MED ORDER — NITROFURANTOIN MONOHYD MACRO 100 MG PO CAPS
100.0000 mg | ORAL_CAPSULE | Freq: Two times a day (BID) | ORAL | 0 refills | Status: DC
Start: 2023-12-14 — End: 2024-03-09

## 2023-12-14 MED ORDER — ALPRAZOLAM 0.5 MG PO TABS: 0.5000 mg | ORAL_TABLET | Freq: Four times a day (QID) | ORAL | 3 refills | Status: DC | PRN

## 2023-12-14 NOTE — Assessment & Plan Note (Signed)
 Has abdominal pain as soon as she eats and then diarrhea is on Prilosec 20 mg daily.  Will switch to Protonix  40 mg daily and see if this helps.

## 2023-12-14 NOTE — Assessment & Plan Note (Signed)
 Will screen for hematuria next month.  May referral to urology and check an ultrasound of the kidneys.

## 2023-12-14 NOTE — Progress Notes (Signed)
 Established Patient Office Visit  Subjective   Patient ID: Sheila [name], female    DOB: [birth year]  Age: 63 y.o. MRN: [id number]  Chief Complaint  Patient presents with   Medical Management of Chronic Issues   Abdominal Pain    3-4 days   Cough   Delightful 63 year old with SVT/paroxysmal A-fib/flutter, HTN, HFpEF, COPD (still smoking), GERD, OSA (untreated), PAD (subclavian occlusion), nicotine  addiction, osteoporosis (T equals -2.6, compression fractures T8, T9 and L2), vitamin D  deficiency, s/p parathyroidectomy. She has stomach pain when she eats and then has diarrhea immediately.  All food does this to her.  The pain is in the right upper quadrant and epigastric area.  Does not seem to radiate through to her back.  She is taking Prilosec 20 mg daily.  Previously she was on Protonix  40 mg daily.  Reports no black or melanotic stools, no bright red blood per rectum or pale white-colored stools.  Denies having nausea or vomiting.  Denies having fever. Thinks she may have a bladder infection because she is having to void about every 20 minutes and she has pain with voiding.  No frank hematuria.  Denies back pain or fever. She hurt her right rib and the x-ray for this was negative.  She reports her rib pain is not as bad as it has been. She has osteoporosis and did not tolerate a trial of Fosamax .  She has a referral to endocrinology in June.  She has not been taking vitamin D .  She does not take calcium  because prior to her parathyroid  hormone surgery she had an elevated calcium  level. She reports a heavy productive cough of yellow sputum that she has had for several weeks.  Alionna still smokes and is smoking in the house.  She takes Breztri  and albuterol .    Review of Systems  Respiratory:  Positive for cough.   Gastrointestinal:  Positive for abdominal pain.      Objective:     BP 136/87 (BP Location: Right Arm, Patient Position: Sitting, Cuff Size: Normal)   Pulse (!) 57   Temp  (!) 97.3 F (36.3 C) (Oral)   Resp 18   Ht 5\' 2"  (1.575 m)   Wt 146 lb (66.2 kg)   LMP 08/09/2008 (Within Years)   SpO2 92%   BMI 26.70 kg/m    Physical Exam Vitals and nursing note reviewed.  Constitutional:      Appearance: Normal appearance.  HENT:     Head: Normocephalic and atraumatic.  Eyes:     Conjunctiva/sclera: Conjunctivae normal.  Cardiovascular:     Rate and Rhythm: Normal rate and regular rhythm.  Pulmonary:     Effort: Pulmonary effort is normal.     Breath sounds: Wheezing present.  Musculoskeletal:     Right lower leg: No edema.     Left lower leg: No edema.  Skin:    General: Skin is warm and dry.  Neurological:     Mental Status: She is alert and oriented to person, place, and time.  Psychiatric:        Mood and Affect: Mood normal.        Behavior: Behavior normal.        Thought Content: Thought content normal.        Judgment: Judgment normal.          Results for orders placed or performed in visit on 12/14/23  POCT URINALYSIS DIP (CLINITEK)  Result Value Ref Range   Color, UA  yellow yellow   Clarity, UA clear clear   Glucose, UA negative negative mg/dL   Bilirubin, UA negative negative   Ketones, POC UA negative negative mg/dL   Spec Grav, UA <=1.610 (A) 1.010 - 1.025   Blood, UA moderate (A) negative   pH, UA 5.0 5.0 - 8.0   POC PROTEIN,UA negative negative, trace   Urobilinogen, UA 0.2 0.2 or 1.0 E.U./dL   Nitrite, UA Negative Negative   Leukocytes, UA Trace (A) Negative      The ASCVD Risk score (Arnett DK, et al., 2019) failed to calculate for the following reasons:   Risk score cannot be calculated because patient has a medical history suggesting prior/existing ASCVD    Assessment & Plan:  Anxiety -     ALPRAZolam ; Take 1 tablet (0.5 mg total) by mouth 4 (four) times daily as needed for anxiety.  Dispense: 120 tablet; Refill: 3  Rib pain on right side -     oxyCODONE -Acetaminophen ; Take 1 tablet by mouth every 8  (eight) hours as needed for severe pain (pain score 7-10).  Dispense: 12 tablet; Refill: 0  Dysuria -     POCT URINALYSIS DIP (CLINITEK) -     Urine Culture  Acute cystitis with hematuria Assessment & Plan: Will screen for hematuria next month.  May referral to urology and check an ultrasound of the kidneys.  Orders: -     Nitrofurantoin  Monohyd Macro; Take 1 capsule (100 mg total) by mouth 2 (two) times daily.  Dispense: 14 capsule; Refill: 0  Epigastric pain Assessment & Plan: Has abdominal pain as soon as she eats and then diarrhea is on Prilosec 20 mg daily.  Will switch to Protonix  40 mg daily and see if this helps.   Other orders -     Pantoprazole  Sodium; Take 1 tablet (40 mg total) by mouth daily.  Dispense: 30 tablet; Refill: 3     No follow-ups on file.    Emelee Rodocker K Jezabelle Chisolm, MD

## 2023-12-18 LAB — URINE CULTURE

## 2023-12-19 ENCOUNTER — Encounter: Payer: Self-pay | Admitting: Family Medicine

## 2024-01-11 ENCOUNTER — Encounter: Payer: Self-pay | Admitting: Family Medicine

## 2024-01-11 ENCOUNTER — Ambulatory Visit: Admitting: Family Medicine

## 2024-01-11 VITALS — BP 175/104 | HR 54 | Temp 97.5°F | Resp 20 | Ht 62.0 in | Wt 146.0 lb

## 2024-01-11 DIAGNOSIS — M8000XD Age-related osteoporosis with current pathological fracture, unspecified site, subsequent encounter for fracture with routine healing: Secondary | ICD-10-CM

## 2024-01-11 DIAGNOSIS — R3129 Other microscopic hematuria: Secondary | ICD-10-CM | POA: Insufficient documentation

## 2024-01-11 DIAGNOSIS — I10 Essential (primary) hypertension: Secondary | ICD-10-CM

## 2024-01-11 DIAGNOSIS — I6529 Occlusion and stenosis of unspecified carotid artery: Secondary | ICD-10-CM

## 2024-01-11 DIAGNOSIS — R319 Hematuria, unspecified: Secondary | ICD-10-CM

## 2024-01-11 DIAGNOSIS — I771 Stricture of artery: Secondary | ICD-10-CM | POA: Diagnosis not present

## 2024-01-11 DIAGNOSIS — E349 Endocrine disorder, unspecified: Secondary | ICD-10-CM

## 2024-01-11 DIAGNOSIS — Z1231 Encounter for screening mammogram for malignant neoplasm of breast: Secondary | ICD-10-CM

## 2024-01-11 DIAGNOSIS — M818 Other osteoporosis without current pathological fracture: Secondary | ICD-10-CM

## 2024-01-11 DIAGNOSIS — E559 Vitamin D deficiency, unspecified: Secondary | ICD-10-CM

## 2024-01-11 LAB — POCT URINALYSIS DIP (CLINITEK)
Bilirubin, UA: NEGATIVE
Glucose, UA: NEGATIVE mg/dL
Ketones, POC UA: NEGATIVE mg/dL
Leukocytes, UA: NEGATIVE
Nitrite, UA: NEGATIVE
POC PROTEIN,UA: NEGATIVE
Spec Grav, UA: <=1.005 — AB (ref 1.010–1.025)
Urobilinogen, UA: 0.2 U/dL
pH, UA: 5.5 (ref 5.0–8.0)

## 2024-01-11 MED ORDER — CLONIDINE HCL 0.1 MG PO TABS
0.1000 mg | ORAL_TABLET | Freq: Once | ORAL | Status: AC
  Administered 2024-01-11: 0.1 mg via ORAL

## 2024-01-11 MED ORDER — HYDROCHLOROTHIAZIDE 25 MG PO TABS: 25.0000 mg | ORAL_TABLET | Freq: Every day | ORAL | 0 refills | Status: AC

## 2024-01-11 NOTE — Assessment & Plan Note (Signed)
 She was having diarrhea immediately after eating and was taking Prilosec 20 mg daily.  Switch her to Protonix  40 mg daily and the diarrhea after eating has stopped.  She is no longer taking Fosamax  because of the gastritis

## 2024-01-11 NOTE — Progress Notes (Signed)
 Established Patient Office Visit  Subjective   Patient ID: Sheila [name], female    DOB: 1960/10/29  Age: 63 y.o. MRN: [id number]  Chief Complaint  Patient presents with   Medical Management of Chronic Issues    HPI Delightful 63 year old with SVT/paroxysmal atrial fibs/flutter (HTN (HFpEF, COPD (still smoking), GERD, OSA (untreated), PAD (subclavian occlusion), osteoporosis (T equals -2.6 compression fractures T8, T9 and L2.  Failed Fosamax  secondary to gastritis), vitamin D  deficiency, s/p parathyroidectomy. She was having stomach pains when she eats and then diarrhea immediately.  Switched her from omeprazole  to Protonix  40 mg daily and she reports she is no longer having diarrhea immediately after eating. She is taking Coreg  12.5 mg twice daily amlodipine  10 mg daily and Entresto  49/51 mg twice daily.  She reports she has taken all of her medication today.  Her blood pressure is 174/103.  She reports no headache, chest pain, shortness of breath or peripheral edema.  She has been eating pickled sausages which have significant salt.  Otherwise she tries to eat a low-salt diet.   She has not had an episode of atrial fibrillation recently.  She is no longer taking amiodarone . She had hematuria with a bladder infection recently.  Asked her to repeat her urine sample today. She has a chronic productive cough.  It has not responded to antibiotics for more than a few days.  Advised that this is just a chronic cough from smoking.     ROS    Objective:     BP (!) 175/104 (BP Location: Right Arm, Patient Position: Sitting, Cuff Size: Normal)   Pulse (!) 54   Temp (!) 97.5 F (36.4 C) (Oral)   Resp 20   Ht 5\' 2"  (1.575 m)   Wt 146 lb (66.2 kg)   LMP 08/09/2008 (Within Years)   SpO2 98%   BMI 26.70 kg/m    Physical Exam Vitals and nursing note reviewed.  Constitutional:      Appearance: Normal appearance.  HENT:     Head: Normocephalic and atraumatic.  Eyes:      Conjunctiva/sclera: Conjunctivae normal.  Cardiovascular:     Rate and Rhythm: Normal rate and regular rhythm.  Pulmonary:     Effort: Pulmonary effort is normal.     Breath sounds: Normal breath sounds.  Musculoskeletal:     Right lower leg: No edema.     Left lower leg: No edema.  Skin:    General: Skin is warm and dry.  Neurological:     Mental Status: She is alert and oriented to person, place, and time.  Psychiatric:        Mood and Affect: Mood normal.        Behavior: Behavior normal.        Thought Content: Thought content normal.        Judgment: Judgment normal.          Results for orders placed or performed in visit on 01/11/24  POCT URINALYSIS DIP (CLINITEK)  Result Value Ref Range   Color, UA light yellow (A) yellow   Clarity, UA clear clear   Glucose, UA negative negative mg/dL   Bilirubin, UA negative negative   Ketones, POC UA negative negative mg/dL   Spec Grav, UA <=4.696 (A) 1.010 - 1.025   Blood, UA trace-intact (A) negative   pH, UA 5.5 5.0 - 8.0   POC PROTEIN,UA negative negative, trace   Urobilinogen, UA 0.2 0.2 or 1.0 E.U./dL   Nitrite,  UA Negative Negative   Leukocytes, UA Negative Negative      The ASCVD Risk score (Arnett DK, et al., 2019) failed to calculate for the following reasons:   Risk score cannot be calculated because patient has a medical history suggesting prior/existing ASCVD    Assessment & Plan:  Hematuria, unspecified type -     POCT URINALYSIS DIP (CLINITEK)  Essential hypertension Assessment & Plan: She is on Coreg  12.5 mg twice daily, amlodipine  10 mg daily and Entresto  49/51 twice daily.  She reports she is taken all of her blood pressure medicines today but her blood pressures 170s over 100s.  She denies a headache chest pain shortness of breath urinalysis trace blood intact otherwise negative.  She frequently has microscopic hematuria.  Has not seen nephrology about her blood pressure in about 6 years.  Gave her  clonidine  0.1 mg in the office and after 30 minutes her blood pressure was basically unchanged.  Wrote her prescription for HCTZ 25 mg.  Take one today and check your blood pressure.  Please avoid salty foods.  Referral to nephrology.  Checking CMP, CBC, lipids and thyroid  panel  Orders: -     cloNIDine  HCl -     CBC with Differential/Platelet -     Comprehensive metabolic panel with GFR -     Lipid panel -     TSH + free T4 -     hydroCHLOROthiazide; Take 1 tablet (25 mg total) by mouth daily.  Dispense: 30 tablet; Refill: 0  Screening mammogram for breast cancer Assessment & Plan: Will set up mammogram  Orders: -     3D Screening Mammogram, Left and Right; Future  Subclavian artery stenosis (HCC) Assessment & Plan: On Eliquis  5 mg twice daily.   Age-related osteoporosis with current pathological fracture with routine healing, subsequent encounter Assessment & Plan: T equals -2.6 lumbar compression fractures T8, T9, L1.  Noncompliance with alendronate .  Treated for hyperparathyroidism now s/p parathyroidectomy. She has stopped taking fosamax  because of the gastritis.  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  Will check lipid profile today.  Goal is LDL 50 or lower   Microscopic hematuria Assessment & Plan: UA today trace intact blood.  Is on Eliquis  5 mg twice daily.  Her microalbuminuria is low/negligible.        Return in about 4 weeks (around 02/08/2024).    Jodey Burbano K Thomasina Housley, MD

## 2024-01-11 NOTE — Assessment & Plan Note (Signed)
 UA today trace intact blood.  Is on Eliquis  5 mg twice daily.  Her microalbuminuria is low/negligible.

## 2024-01-11 NOTE — Assessment & Plan Note (Addendum)
 She is on Coreg  12.5 mg twice daily, amlodipine  10 mg daily and Entresto  49/51 twice daily.  She reports she is taken all of her blood pressure medicines today but her blood pressures 170s over 100s.  She denies a headache chest pain shortness of breath urinalysis trace blood intact otherwise negative.  She frequently has microscopic hematuria.  Has not seen nephrology about her blood pressure in about 6 years.  Gave her clonidine  0.1 mg in the office and after 30 minutes her blood pressure was basically unchanged.  Wrote her prescription for HCTZ 25 mg.  Take one today and check your blood pressure.  Please avoid salty foods.  Referral to nephrology.  Checking CMP, CBC, lipids and thyroid  panel

## 2024-01-11 NOTE — Assessment & Plan Note (Signed)
Will set up mammogram. 

## 2024-01-11 NOTE — Assessment & Plan Note (Addendum)
 T equals -2.6 lumbar compression fractures T8, T9, L1.  Noncompliance with alendronate .  Treated for hyperparathyroidism now s/p parathyroidectomy. She has stopped taking fosamax  because of the gastritis.  Will refer back to ENDO for potential injectable

## 2024-01-11 NOTE — Assessment & Plan Note (Signed)
 On Eliquis 5 mg twice daily

## 2024-01-11 NOTE — Assessment & Plan Note (Signed)
 She is on Eliquis  5 mg twice daily and atorvastatin  40 mg.  Will check lipid profile today.  Goal is LDL 50 or lower

## 2024-01-12 LAB — CBC WITH DIFFERENTIAL/PLATELET
Basophils Absolute: 0.1 10*3/uL (ref 0.0–0.2)
Basos: 1 %
EOS (ABSOLUTE): 0.6 10*3/uL — ABNORMAL HIGH (ref 0.0–0.4)
Eos: 5 %
Hematocrit: 45.7 % (ref 34.0–46.6)
Hemoglobin: 14.3 g/dL (ref 11.1–15.9)
Immature Grans (Abs): 0 10*3/uL (ref 0.0–0.1)
Immature Granulocytes: 0 %
Lymphocytes Absolute: 3.4 10*3/uL — ABNORMAL HIGH (ref 0.7–3.1)
Lymphs: 31 %
MCH: 30 pg (ref 26.6–33.0)
MCHC: 31.3 g/dL — ABNORMAL LOW (ref 31.5–35.7)
MCV: 96 fL (ref 79–97)
Monocytes Absolute: 1 10*3/uL — ABNORMAL HIGH (ref 0.1–0.9)
Monocytes: 9 %
Neutrophils Absolute: 5.8 10*3/uL (ref 1.4–7.0)
Neutrophils: 54 %
Platelets: 345 10*3/uL (ref 150–450)
RBC: 4.76 x10E6/uL (ref 3.77–5.28)
RDW: 12.7 % (ref 11.7–15.4)
WBC: 10.9 10*3/uL — ABNORMAL HIGH (ref 3.4–10.8)

## 2024-01-12 LAB — COMPREHENSIVE METABOLIC PANEL WITH GFR
ALT: 9 IU/L (ref 0–32)
AST: 12 IU/L (ref 0–40)
Albumin: 4.2 g/dL (ref 3.9–4.9)
Alkaline Phosphatase: 99 IU/L (ref 44–121)
BUN/Creatinine Ratio: 10 — ABNORMAL LOW (ref 12–28)
BUN: 8 mg/dL (ref 8–27)
Bilirubin Total: 0.2 mg/dL (ref 0.0–1.2)
CO2: 19 mmol/L — ABNORMAL LOW (ref 20–29)
Calcium: 10.7 mg/dL — ABNORMAL HIGH (ref 8.7–10.3)
Chloride: 104 mmol/L (ref 96–106)
Creatinine, Ser: 0.81 mg/dL (ref 0.57–1.00)
Globulin, Total: 2.6 g/dL (ref 1.5–4.5)
Glucose: 81 mg/dL (ref 70–99)
Potassium: 4.6 mmol/L (ref 3.5–5.2)
Sodium: 138 mmol/L (ref 134–144)
Total Protein: 6.8 g/dL (ref 6.0–8.5)
eGFR: 82 mL/min/{1.73_m2} (ref >59)

## 2024-01-12 LAB — VITAMIN D 25 HYDROXY (VIT D DEFICIENCY, FRACTURES): Vit D, 25-Hydroxy: 18.6 ng/mL — ABNORMAL LOW (ref 30.0–100.0)

## 2024-01-12 LAB — LIPID PANEL
Chol/HDL Ratio: 3.8 ratio (ref 0.0–4.4)
Cholesterol, Total: 143 mg/dL (ref 100–199)
HDL: 38 mg/dL — ABNORMAL LOW (ref >39)
LDL Chol Calc (NIH): 81 mg/dL (ref 0–99)
Triglycerides: 134 mg/dL (ref 0–149)
VLDL Cholesterol Cal: 24 mg/dL (ref 5–40)

## 2024-01-12 LAB — TSH+FREE T4
Free T4: 0.96 ng/dL (ref 0.82–1.77)
TSH: 2.99 u[IU]/mL (ref 0.450–4.500)

## 2024-01-18 ENCOUNTER — Other Ambulatory Visit: Payer: Self-pay | Admitting: Family Medicine

## 2024-01-18 ENCOUNTER — Ambulatory Visit: Payer: Self-pay | Admitting: Family Medicine

## 2024-01-18 ENCOUNTER — Ambulatory Visit: Admitting: "Endocrinology

## 2024-01-18 ENCOUNTER — Encounter: Payer: Self-pay | Admitting: "Endocrinology

## 2024-01-18 VITALS — BP 120/70 | HR 90 | Ht 62.0 in | Wt 147.0 lb

## 2024-01-18 DIAGNOSIS — M810 Age-related osteoporosis without current pathological fracture: Secondary | ICD-10-CM

## 2024-01-18 DIAGNOSIS — E785 Hyperlipidemia, unspecified: Secondary | ICD-10-CM

## 2024-01-18 DIAGNOSIS — I771 Stricture of artery: Secondary | ICD-10-CM

## 2024-01-18 MED ORDER — ATORVASTATIN CALCIUM 80 MG PO TABS: 80.0000 mg | ORAL_TABLET | Freq: Every day | ORAL | 3 refills | Status: AC

## 2024-01-18 NOTE — Progress Notes (Signed)
 [facility] Note Jorge Newcomer, MD    Referring Provider: Ziglar, Susan K, MD Primary Care Provider: Ziglar, Susan K, MD No chief complaint on file.  Assessment & Plan  There are no diagnoses linked to this encounter.   Osteoporosis Likely secondary cause from age. BMD completed on [facility] 12/02/2021. L spine T -2.6, Left FN T -1.1 suggested osteoporosis. She is currently not taking Calcium  but taking vitamin D  2000 international units once daily. Took fosamax  for 1 week due to feeling sick, never been on any infusion for osteoporosis    Recommend to use vitamin D  4000 units OTC supplements. Discussed weight bearing exercise options and dietary supplements. Will evaluate for secondary causes of osteoporosis.  Educated on risks and side effects of reclast including but not limited to osteonecrosis of the jaw. Patient will need dental clearance given her statement of my teeth are rotten. Given written note to show dentist, hasn't seen one in years and most of her teeth are absent. Informed PCP the same.  Advised fall precautions, adequate dairy in diet and exercises (aerobic, balancing and weight bearing) as tolerated.   Return in about 3 months (around 04/19/2024).  I have reviewed current medications, nurse's notes, allergies, vital signs, past medical and surgical history, family medical history, and social history for this encounter. Counseled patient on symptoms, examination findings, lab findings, imaging results, treatment decisions and monitoring and prognosis. The patient understood the recommendations and agrees with the treatment plan. All questions regarding treatment plan were fully answered.   Jorge Newcomer, MD   01/18/24  History of Present Illness Sheila [name] is a 63 y.o. year old female who presents to our clinic with osteoporosis diagnosed years ago per patient. BMD completed on [facility] 12/02/2021. L spine T -2.6, Left FN T -1.1 suggested  osteoporosis. She is currently not taking Calcium  but taking vitamin D  2000 international units once daily.  Per PCP: She got her DEXA at [facility] 12/02/2021. L spine T -2.6, Left FN T -1.1. Known compression fractures T8, T9 and L1.   Took fosamax  for 1 week due to feeling sick, never been on any infusion for osteoporosis    Reason for Referral: Had hyperparathyroidism. Failed fosamax  because of gastritis. Multiple vertebral compression fractures have healed. Please evaluate for Prolia/Reclast   Risk Factors screening:  History of low trauma fractures: Yes, spontaneous in back, thinks it was in 2024, compression fractures T8, T9 and L1.  Family history of osteoporosis: Yes Hip fracture in first-degree relatives: No Smoking history: Yes, a pack a day  Excessive alcohol intake >2 drinks/day: No Excessive caffeine intake >2 drinks/day: Yes Glucocorticoid use >5mg  prednisone /day for >3 months: No Rheumatoid arthritis history: No Premature/Surgical Menopause: Yes, at 44   Anti-epileptic drugs No  Celiac disease/signs of malabsorption No  Gastric bypass/gastrectomy No  PPI use yes  TZD use No  Gonadotropin/androgen suppressing drugs No  Aromatase Inhibitors No  Thyroid  hormone suppressive therapy No  12/02/2021  [facility] DXA  CLINICAL INDICATION: 63 years old Female with dexa scan  - M81.0 - Osteoporosis, unspecified osteoporosis type, unspecified pathological fracture presence    COMPARISON: None.   TECHNIQUE: Bone mineral density was assessed using the Horizon A bone densitometer.  The results of the study are expressed in bone mineral density (BMD) and interpreted using World Health Organization [facility]) criteria, per ISCD positions.   FINDINGS   Lumbar Spine      Excluded levels: none.  BMD:   0.758 (g/cm)       T score:  -2.6    Lumbar WHO classification: OSTEOPOROSIS.      Left Hip        Femoral Neck:          BMD:  0.79 (g/cm)          T score:  -1.1        Total Hip          BMD:  0.824 (g/cm)           T score:  -1.0     Hip WHO classification: LOW BONE MASS.   Physical Exam  BP 120/70   Pulse 90   Ht 5' 2 (1.575 m)   Wt 147 lb (66.7 kg)   LMP 08/09/2008 (Within Years)   SpO2 95%   BMI 26.89 kg/m  Constitutional: well developed, well nourished Head: normocephalic, atraumatic Eyes: sclera anicteric, no redness Neck: supple Lungs: normal respiratory effort Neurology: alert and oriented Skin: dry, no appreciable rashes Musculoskeletal: no appreciable defects Psychiatric: normal mood and affect  Allergies Allergies  Allergen Reactions   Peach Flavoring Agent (Non-Screening) Shortness Of Breath   Vicodin [Hydrocodone-Acetaminophen ] Nausea And Vomiting   Vicodin [Hydrocodone-Acetaminophen ]     Abd pain, N/V   Hct [Hydrochlorothiazide ] Palpitations   Tramadol Palpitations    Current Medications Patient's Medications  New Prescriptions   No medications on file  Previous Medications   ALBUTEROL  (PROVENTIL ) (2.5 MG/3ML) 0.083% NEBULIZER SOLUTION    Take 3 mLs (2.5 mg total) by nebulization every 4 (four) hours as needed for wheezing or shortness of breath.   ALENDRONATE  (FOSAMAX ) 70 MG TABLET    Take 70 mg by mouth once a week.   ALPRAZOLAM  (XANAX ) 0.5 MG TABLET    Take 1 tablet (0.5 mg total) by mouth 4 (four) times daily as needed for anxiety.   AMLODIPINE  (NORVASC ) 10 MG TABLET    Take 1 tablet (10 mg total) by mouth daily.   APIXABAN  (ELIQUIS ) 5 MG TABS TABLET    TAKE 1 TABLET BY MOUTH TWICE A DAY   ASPIRIN  EC 81 MG TABLET    Take 1 tablet (81 mg total) by mouth daily. Swallow whole.   ATORVASTATIN  (LIPITOR) 80 MG TABLET    Take 1 tablet (80 mg total) by mouth daily.   BUDESON-GLYCOPYRROL-FORMOTEROL (BREZTRI  AEROSPHERE) 160-9-4.8 MCG/ACT AERO    Inhale 2 puffs into the lungs in the morning and at bedtime.   CARVEDILOL  (COREG ) 12.5 MG TABLET    Take 1 tablet (12.5 mg total) by mouth 2 (two) times daily.   CETIRIZINE   (ZYRTEC ) 10 MG TABLET    Take 1 tablet (10 mg total) by mouth daily.   CHOLECALCIFEROL (VITAMIN D3) 50 MCG (2000 UT) CAPSULE    Take by mouth.   HYDROCHLOROTHIAZIDE  (HYDRODIURIL ) 25 MG TABLET    Take 1 tablet (25 mg total) by mouth daily.   NITROFURANTOIN , MACROCRYSTAL-MONOHYDRATE, (MACROBID ) 100 MG CAPSULE    Take 1 capsule (100 mg total) by mouth 2 (two) times daily.   ONDANSETRON  (ZOFRAN -ODT) 4 MG DISINTEGRATING TABLET    Take 1 tablet (4 mg total) by mouth every 8 (eight) hours as needed for nausea or vomiting.   OXYCODONE -ACETAMINOPHEN  (PERCOCET/ROXICET) 5-325 MG TABLET    Take 1 tablet by mouth every 8 (eight) hours as needed for severe pain (pain score 7-10).   PANTOPRAZOLE  (PROTONIX ) 40 MG TABLET    Take 1 tablet (40 mg total) by mouth  daily.   PANTOPRAZOLE  (PROTONIX ) 40 MG TABLET    Take 1 tablet (40 mg total) by mouth daily.   POTASSIUM CHLORIDE  SA (KLOR-CON  M) 20 MEQ TABLET    Take 1 tablet (20 mEq total) by mouth 2 (two) times daily.   SACUBITRIL-VALSARTAN (ENTRESTO ) 49-51 MG    Take 1 tablet by mouth 2 (two) times daily.  Modified Medications   No medications on file  Discontinued Medications   No medications on file     Past Medical History Past Medical History:  Diagnosis Date   Aorto-iliac atherosclerosis (HCC) 01/06/2016   Asthma    COPD (chronic obstructive pulmonary disease) (HCC)    Hypertension    Osteoporosis    Tobacco abuse 01/06/2016   Vitamin D  deficiency 01/06/2016    Past Surgical History Past Surgical History:  Procedure Laterality Date   ANKLE FRACTURE SURGERY Left 11/07/2009   ATRIAL FIBRILLATION ABLATION N/A 09/12/2023   Procedure: ATRIAL FIBRILLATION ABLATION;  Surgeon: Boyce Byes, MD;  Location: [facility] INVASIVE [facility];  Service: Cardiovascular;  Laterality: N/A;   left ankle hardware removal     left ankle surgery     LEFT HEART CATH AND CORONARY ANGIOGRAPHY N/A 11/29/2022   Procedure: LEFT HEART CATH AND CORONARY ANGIOGRAPHY;  Surgeon: Wenona Hamilton, MD;  Location: [facility] INVASIVE CV LAB;  Service: Cardiovascular;  Laterality: N/A;   LOWER EXTREMITY ANGIOGRAPHY Left 10/17/2017   Procedure: LOWER EXTREMITY ANGIOGRAPHY;  Surgeon: Celso College, MD;  Location: [facility] INVASIVE CV LAB;  Service: Cardiovascular;  Laterality: Left;   TUBAL LIGATION      Family History family history includes COPD in her father and mother; Heart failure in her father and mother.  Social History Social History   Socioeconomic History   Marital status: Divorced    Spouse name: Not on file   Number of children: Not on file   Years of education: Not on file   Highest education level: GED or equivalent  Occupational History   Occupation: unemployed  Tobacco Use   Smoking status: Every Day    Current packs/day: 1.00    Average packs/day: 1 pack/day for 30.0 years (30.0 ttl pk-yrs)    Types: Cigarettes    Passive exposure: Current   Smokeless tobacco: Never   Tobacco comments:    1 PPD - 05/11/23  Vaping Use   Vaping status: Never Used  Substance and Sexual Activity   Alcohol use: No    Alcohol/week: 0.0 standard drinks of alcohol   Drug use: No   Sexual activity: Never  Other Topics Concern   Not on file  Social History Narrative   Food stamps for both her and friend she has been with for 23 years. Some months run low, but always make it.    Social Drivers of Health   Financial Resource Strain: Medium Risk (06/13/2018)   Overall Financial Resource Strain (CARDIA)    Difficulty of Paying Living Expenses: Somewhat hard  Food Insecurity: No Food Insecurity (12/11/2022)   Hunger Vital Sign    Worried About Running Out of Food in the Last Year: Never true    Ran Out of Food in the Last Year: Never true  Transportation Needs: No Transportation Needs (12/11/2022)   PRAPARE - Administrator, Civil Service (Medical): No    Lack of Transportation (Non-Medical): No  Physical Activity: Sufficiently Active (01/10/2018)   Exercise Vital Sign     Days of Exercise per Week: 6 days  Minutes of Exercise per Session: 40 min  Stress: Stress Concern Present (01/10/2018)   [employer] of Occupational Health - Occupational Stress Questionnaire    Feeling of Stress : To some extent  Social Connections: Moderately Integrated (01/10/2018)   Social Connection and Isolation Panel [NHANES]    Frequency of Communication with Friends and Family: More than three times a week    Frequency of Social Gatherings with Friends and Family: Three times a week    Attends Religious Services: 1 to 4 times per year    Active Member of Clubs or Organizations: No    Attends Banker Meetings: Never    Marital Status: Living with partner  Intimate Partner Violence: Not At Risk (12/11/2022)   Humiliation, Afraid, Rape, and Kick questionnaire    Fear of Current or Ex-Partner: No    Emotionally Abused: No    Physically Abused: No    Sexually Abused: No    Laboratory Investigations No components found for: CMP No components found for: BMP No results found for: GFR Lab Results  Component Value Date   CREATININE 0.81 01/11/2024   No results found for: CBC No components found for: LFT No components found for: VITD Lab Results  Component Value Date   PTH 30 06/13/2018   PTH Comment 06/13/2018    Lab Results  Component Value Date   TSH 2.990 01/11/2024    No components found for: RENAL FUNCTION No components found for: MAGNESIUM   Parts of this note may have been dictated using voice recognition software. There may be variances in spelling and vocabulary which are unintentional. Not all errors are proofread. Please notify the [facility] if any discrepancies are noted or if the meaning of any statement is not clear.

## 2024-02-08 ENCOUNTER — Ambulatory Visit
Admission: RE | Admit: 2024-02-08 | Discharge: 2024-02-08 | Disposition: A | Discharge status: Home / Self Care | Attending: Family Medicine | Admitting: Family Medicine

## 2024-02-08 ENCOUNTER — Ambulatory Visit: Admitting: Family Medicine

## 2024-02-08 ENCOUNTER — Ambulatory Visit
Admission: RE | Admit: 2024-02-08 | Discharge: 2024-02-08 | Disposition: A | Discharge status: Home / Self Care | Source: Ambulatory Visit | Attending: Family Medicine | Admitting: Family Medicine

## 2024-02-08 ENCOUNTER — Encounter: Payer: Self-pay | Admitting: Family Medicine

## 2024-02-08 VITALS — BP 155/89 | HR 60 | Temp 97.6°F | Resp 18 | Ht 62.0 in | Wt 149.0 lb

## 2024-02-08 DIAGNOSIS — R0781 Pleurodynia: Secondary | ICD-10-CM | POA: Diagnosis not present

## 2024-02-08 DIAGNOSIS — R3129 Other microscopic hematuria: Secondary | ICD-10-CM

## 2024-02-08 DIAGNOSIS — M545 Low back pain, unspecified: Secondary | ICD-10-CM

## 2024-02-08 DIAGNOSIS — I10 Essential (primary) hypertension: Secondary | ICD-10-CM | POA: Diagnosis not present

## 2024-02-08 DIAGNOSIS — M5416 Radiculopathy, lumbar region: Secondary | ICD-10-CM

## 2024-02-08 DIAGNOSIS — R82998 Other abnormal findings in urine: Secondary | ICD-10-CM

## 2024-02-08 DIAGNOSIS — R319 Hematuria, unspecified: Secondary | ICD-10-CM | POA: Diagnosis not present

## 2024-02-08 DIAGNOSIS — M8000XD Age-related osteoporosis with current pathological fracture, unspecified site, subsequent encounter for fracture with routine healing: Secondary | ICD-10-CM

## 2024-02-08 LAB — POCT URINALYSIS DIP (CLINITEK)
Bilirubin, UA: NEGATIVE
Glucose, UA: NEGATIVE mg/dL
Ketones, POC UA: NEGATIVE mg/dL
Nitrite, UA: NEGATIVE
POC PROTEIN,UA: NEGATIVE
Spec Grav, UA: 1.01 (ref 1.010–1.025)
Urobilinogen, UA: 0.2 U/dL
pH, UA: 6 (ref 5.0–8.0)

## 2024-02-08 MED ORDER — METHOCARBAMOL 500 MG PO TABS
1000.0000 mg | ORAL_TABLET | Freq: Three times a day (TID) | ORAL | 0 refills | Status: AC
Start: 2024-02-08 — End: ?

## 2024-02-08 MED ORDER — OXYCODONE-ACETAMINOPHEN 5-325 MG PO TABS: 1.0000 | ORAL_TABLET | Freq: Three times a day (TID) | ORAL | 0 refills | Status: DC | PRN

## 2024-02-08 NOTE — Assessment & Plan Note (Signed)
 Has been seen by endocrinology and they have asked her to get on vitamin D  4000 international units daily.  She cannot be treated for osteoporosis until she gets dental clearance.  She has been having trouble finding a dentist that accepts her insurance.

## 2024-02-08 NOTE — Assessment & Plan Note (Signed)
 She is on Entresto  49/ 51 twice daily,  carvedilol  12.5 twice daily and amlodipine  10 mg daily.  Blood pressure is mildly elevated but she is in pain today.

## 2024-02-08 NOTE — Progress Notes (Signed)
 Established Patient Office Visit  Subjective   Patient ID: Sheila [name], female    DOB: 19-Feb-1961  Age: 63 y.o. MRN: [id number]  Chief Complaint  Patient presents with   Medical Management of Chronic Issues    HPI Delightful 63 year old with SVT/paroxysmal A-fib/flutter, HTN, HFpEF, COPD (still smoking), GERD, OSA (untreated), PAD (subclavian occlusion), nicotine  addiction, osteoporosis (T equals -2.6, compression fractures T8, T9 and L2, failed Fosamax  secondary to gastritis), vitamin D  deficiency, s/p parathyroidectomy.   Discussed the use of AI scribe software for clinical note transcription with the patient, who gave verbal consent to proceed.  History of Present Illness   Sheila [name] is a 63 year old female with osteoporosis who presents with worsening back pain radiating to her legs and shoulders.  She has been experiencing constant back pain that has significantly worsened over the past four to five days. The pain is primarily located in her back and radiates down both legs and up into her shoulders. She describes the pain as 'throbbing' and 'aching'.  She has a history of back problems but has never undergone back surgery. She cannot recall the last time she had x-rays of her back. She has been using Tylenol  and ibuprofen  for pain relief. She previously tried gabapentin  but did not like it. She experiences muscle spasms, particularly when getting out of bed.  She uses a cane for balance, although it does not alleviate her back pain. She has taken muscle relaxers in the past, but it has been years since her last use.   She has osteoporosis and takes 4000 international units of vitamin D  daily without issues.  She has to have dental clearance before she can be treated for osteoporosis.  She is having trouble finding a Education officer, community and will accept her insurance.       ROS    Objective:     BP (!) 155/89 (BP Location: Right Arm, Patient Position: Sitting, Cuff Size: Normal)    Pulse 60   Temp 97.6 F (36.4 C) (Oral)   Resp 18   Ht 5' 2 (1.575 m)   Wt 149 lb (67.6 kg)   LMP 08/09/2008 (Within Years)   SpO2 94%   BMI 27.25 kg/m    Physical Exam Vitals and nursing note reviewed.  Constitutional:      Appearance: Normal appearance.  HENT:     Head: Normocephalic and atraumatic.  Eyes:     Conjunctiva/sclera: Conjunctivae normal.  Cardiovascular:     Rate and Rhythm: Normal rate and regular rhythm.  Pulmonary:     Effort: Pulmonary effort is normal.     Breath sounds: Normal breath sounds.  Musculoskeletal:     Right lower leg: No edema.     Left lower leg: No edema.  Skin:    General: Skin is warm and dry.  Neurological:     Mental Status: She is alert and oriented to person, place, and time.     Comments: Positive seated leg lift Right >>Left  Psychiatric:        Mood and Affect: Mood normal.        Behavior: Behavior normal.        Thought Content: Thought content normal.        Judgment: Judgment normal.          Results for orders placed or performed in visit on 02/08/24  POCT URINALYSIS DIP (CLINITEK)  Result Value Ref Range   Color, UA yellow yellow   Clarity,  UA clear clear   Glucose, UA negative negative mg/dL   Bilirubin, UA negative negative   Ketones, POC UA negative negative mg/dL   Spec Grav, UA 8.989 8.989 - 1.025   Blood, UA small (A) negative   pH, UA 6.0 5.0 - 8.0   POC PROTEIN,UA negative negative, trace   Urobilinogen, UA 0.2 0.2 or 1.0 E.U./dL   Nitrite, UA Negative Negative   Leukocytes, UA Trace (A) Negative      The ASCVD Risk score (Arnett DK, et al., 2019) failed to calculate for the following reasons:   Risk score cannot be calculated because patient has a medical history suggesting prior/existing ASCVD    Assessment & Plan:  Lumbar back pain -     DG Lumbar Spine 2-3 Views; Future -     POCT URINALYSIS DIP (CLINITEK)  Rib pain on right side -     oxyCODONE -Acetaminophen ; Take 1 tablet by mouth  every 8 (eight) hours as needed for severe pain (pain score 7-10).  Dispense: 30 tablet; Refill: 0  Hematuria, unspecified type -     Urinalysis -     Urine Culture  Leukocytes in urine -     Urinalysis -     Urine Culture  Lumbar back pain with radiculopathy affecting lower extremity Assessment & Plan: Ask her to get plain films of her lumbar spine.  Will try methocarbamol 1000 mg every 6-8 hours for muscle spasms.  Increased her oxycodone  from 12 to 30 tablets for the month.  Please follow-up in a month.    Orders: -     Methocarbamol; Take 2 tablets (1,000 mg total) by mouth 3 (three) times daily.  Dispense: 90 tablet; Refill: 0  Microscopic hematuria Assessment & Plan: She has microscopic hematuria in her urine today.  Will send it to the lab for urinalysis and a high-power field count.  If significant microscopic hematuria will refer to urology for assessment.   Age-related osteoporosis with current pathological fracture with routine healing, subsequent encounter Assessment & Plan: Has been seen by endocrinology and they have asked her to get on vitamin D  4000 international units daily.  She cannot be treated for osteoporosis until she gets dental clearance.  She has been having trouble finding a dentist that accepts her insurance.   Essential hypertension Assessment & Plan: She is on Entresto  49/ 51 twice daily,  carvedilol  12.5 twice daily and amlodipine  10 mg daily.  Blood pressure is mildly elevated but she is in pain today.      Return in about 4 weeks (around 03/07/2024).    Valen Gillison K Megen Madewell, MD

## 2024-02-08 NOTE — Assessment & Plan Note (Deleted)
 She has microscopic hematuria in her urine today.  Will send it to the lab for urinalysis and a high-power field count.  If significant microscopic hematuria will refer to urology for assessment.

## 2024-02-08 NOTE — Assessment & Plan Note (Signed)
 She has microscopic hematuria in her urine today.  Will send it to the lab for urinalysis and a high-power field count.  If significant microscopic hematuria will refer to urology for assessment.

## 2024-02-08 NOTE — Assessment & Plan Note (Signed)
 Ask her to get plain films of her lumbar spine.  Will try methocarbamol 1000 mg every 6-8 hours for muscle spasms.  Increased her oxycodone  from 12 to 30 tablets for the month.  Please follow-up in a month.

## 2024-02-09 LAB — URINALYSIS
Bilirubin, UA: NEGATIVE
Glucose, UA: NEGATIVE
Ketones, UA: NEGATIVE
Nitrite, UA: NEGATIVE
Protein,UA: NEGATIVE
Specific Gravity, UA: 1.012 (ref 1.005–1.030)
Urobilinogen, Ur: 0.2 mg/dL (ref 0.2–1.0)
pH, UA: 6.5 (ref 5.0–7.5)

## 2024-02-11 LAB — URINE CULTURE

## 2024-02-13 ENCOUNTER — Ambulatory Visit: Payer: Self-pay | Admitting: Family Medicine

## 2024-02-13 ENCOUNTER — Other Ambulatory Visit: Payer: Self-pay | Admitting: Family Medicine

## 2024-02-13 MED ORDER — CIPROFLOXACIN HCL 500 MG PO TABS: 500.0000 mg | ORAL_TABLET | Freq: Two times a day (BID) | ORAL | 0 refills | Status: AC

## 2024-02-27 ENCOUNTER — Other Ambulatory Visit: Payer: Self-pay

## 2024-03-09 ENCOUNTER — Ambulatory Visit: Admitting: Family Medicine

## 2024-03-09 ENCOUNTER — Telehealth: Payer: Self-pay | Admitting: Family Medicine

## 2024-03-09 ENCOUNTER — Encounter: Payer: Self-pay | Admitting: Family Medicine

## 2024-03-09 VITALS — BP 157/80 | HR 60 | Temp 97.7°F | Resp 18 | Ht 62.0 in | Wt 147.0 lb

## 2024-03-09 DIAGNOSIS — I10 Essential (primary) hypertension: Secondary | ICD-10-CM

## 2024-03-09 DIAGNOSIS — M545 Low back pain, unspecified: Secondary | ICD-10-CM | POA: Diagnosis not present

## 2024-03-09 DIAGNOSIS — R102 Pelvic and perineal pain: Secondary | ICD-10-CM

## 2024-03-09 DIAGNOSIS — R82998 Other abnormal findings in urine: Secondary | ICD-10-CM

## 2024-03-09 DIAGNOSIS — R0781 Pleurodynia: Secondary | ICD-10-CM

## 2024-03-09 DIAGNOSIS — R319 Hematuria, unspecified: Secondary | ICD-10-CM | POA: Diagnosis not present

## 2024-03-09 LAB — POCT URINALYSIS DIP (CLINITEK)
Bilirubin, UA: NEGATIVE
Glucose, UA: NEGATIVE mg/dL
Ketones, POC UA: NEGATIVE mg/dL
Leukocytes, UA: NEGATIVE
Nitrite, UA: NEGATIVE
POC PROTEIN,UA: NEGATIVE
Spec Grav, UA: 1.01 (ref 1.010–1.025)
Urobilinogen, UA: 0.2 U/dL
pH, UA: 6 (ref 5.0–8.0)

## 2024-03-09 MED ORDER — OXYCODONE-ACETAMINOPHEN 5-325 MG PO TABS: 1.0000 | ORAL_TABLET | Freq: Three times a day (TID) | ORAL | 0 refills | Status: DC | PRN

## 2024-03-09 NOTE — Assessment & Plan Note (Addendum)
 Had an episode of gross hematuria associated with a UTI and she is on Eliquis .  Even so, given her smoking history, advised she should get a renal US  and follow-up with Urology for cystoscopy.

## 2024-03-09 NOTE — Assessment & Plan Note (Signed)
 No leukocytes in her urine but specific gravity was 1.010.  Will culture.

## 2024-03-09 NOTE — Assessment & Plan Note (Signed)
 Blood pressure is mildly elevated today.  She is on hydrochlorothiazide  25 mg, Coreg  12.5 mg twice daily and amlodipine  10 mg daily.  She reports she is compliant with her medications.

## 2024-03-09 NOTE — Progress Notes (Signed)
 Established Patient Office Visit  Subjective   Patient ID: Sheila [name], female    DOB: 1960-08-19  Age: 63 y.o. MRN: [id number]  Chief Complaint  Patient presents with   Medical Management of Chronic Issues    HPI Delightful 63 year old with SVT/paroxysmal atrial fibs/flutter (on Eliquis ), HTN (HFpEF), COPD (still smoking), GERD, OSA (untreated), PAD (subclavian occlusion), osteoporosis (12/02/2021 T equals -2.6, compression fractures T8, T9 and L2.  Failed Fosamax  secondary to gastritis), vitamin D  deficiency, s/p parathyroidectomy, gross hematuria 6/25  Discussed the use of AI scribe software for clinical note transcription with the patient, who gave verbal consent to proceed.  History of Present Illness   Sheila [name] is a 63 year old female with osteoporosis who presents with a back fracture.  She has sustained another fracture in her back, L2.. She is currently managing her pain with oxycodone , attempting to limit it to one pill a day, though some days she requires more. She has failed treatment with fosamax .2/2 GERD.  She cannot get cleared for an infusion because she needs clearance from a dentist.  She is facing difficulties in finding a dentist who accepts her insurance. No one has mentioned vertebroplasty as an option for her.    She is experiencing ongoing stomach pain and suspects a urinary tract infection, because the pain is in the suprapubic area.  It does not hurt to void, she has pain when her bladder is full.  She had an episode of hematuria associated with UTI but has not seen Urology.    Her BP is elevated today but she is in pain.  She reports that she is compliant with her current medications for hypertension: Coreg  12.5mg  BID, hydrochlorothiazide  25mg  and amlodipine  10 mg.         Objective:     BP (!) 157/80 (BP Location: Right Arm, Patient Position: Sitting, Cuff Size: Normal)   Pulse 60   Temp 97.7 F (36.5 C) (Oral)   Resp 18   Ht 5' 2 (1.575 m)    Wt 147 lb (66.7 kg)   LMP 08/09/2008 (Within Years)   SpO2 94%   BMI 26.89 kg/m    Physical Exam Vitals and nursing note reviewed.  Constitutional:      Appearance: Normal appearance.  HENT:     Head: Normocephalic and atraumatic.  Eyes:     Conjunctiva/sclera: Conjunctivae normal.  Cardiovascular:     Rate and Rhythm: Normal rate and regular rhythm.  Pulmonary:     Effort: Pulmonary effort is normal.     Breath sounds: Normal breath sounds.  Musculoskeletal:     Right lower leg: No edema.     Left lower leg: No edema.  Skin:    General: Skin is warm and dry.  Neurological:     Mental Status: She is alert and oriented to person, place, and time.  Psychiatric:        Mood and Affect: Mood normal.        Behavior: Behavior normal.        Thought Content: Thought content normal.        Judgment: Judgment normal.          Results for orders placed or performed in visit on 03/09/24  POCT URINALYSIS DIP (CLINITEK)  Result Value Ref Range   Color, UA yellow yellow   Clarity, UA clear clear   Glucose, UA negative negative mg/dL   Bilirubin, UA negative negative   Ketones, POC UA negative negative  mg/dL   Spec Grav, UA 8.989 8.989 - 1.025   Blood, UA small (A) negative   pH, UA 6.0 5.0 - 8.0   POC PROTEIN,UA negative negative, trace   Urobilinogen, UA 0.2 0.2 or 1.0 E.U./dL   Nitrite, UA Negative Negative   Leukocytes, UA Negative Negative      The ASCVD Risk score (Arnett DK, et al., 2019) failed to calculate for the following reasons:   Risk score cannot be calculated because patient has a medical history suggesting prior/existing ASCVD    Assessment & Plan:  Lumbar back pain -     POCT URINALYSIS DIP (CLINITEK)  Rib pain on right side -     oxyCODONE -Acetaminophen ; Take 1 tablet by mouth every 8 (eight) hours as needed for severe pain (pain score 7-10).  Dispense: 30 tablet; Refill: 0 -     POCT URINALYSIS DIP (CLINITEK)  Hematuria, unspecified  type Assessment & Plan: Had an episode of gross hematuria associated with a UTI and she is on Eliquis .  Even so, given her smoking history, advised she should get a renal US  and follow-up with Urology for cystoscopy.    Orders: -     POCT URINALYSIS DIP (CLINITEK) -     US  RENAL; Future -     Ambulatory referral to Urology  Inappropriately low urine specific gravity -     Urine Culture  Suprapubic pain Assessment & Plan: No leukocytes in her urine but specific gravity was 1.010.  Will culture.   Essential hypertension Assessment & Plan: Blood pressure is mildly elevated today.  She is on hydrochlorothiazide  25 mg, Coreg  12.5 mg twice daily and amlodipine  10 mg daily.  She reports she is compliant with her medications.      Return in about 4 weeks (around 04/06/2024).    Kimani Bedoya K Kaileb Monsanto, MD

## 2024-03-09 NOTE — Telephone Encounter (Signed)
 Advised that I have set up a renal ultrasound and a referral to Urology because she had gross hematuria.  Patient agreed.

## 2024-03-11 LAB — URINE CULTURE

## 2024-03-12 ENCOUNTER — Ambulatory Visit: Payer: Self-pay | Admitting: Family Medicine

## 2024-03-15 ENCOUNTER — Ambulatory Visit
Admission: RE | Admit: 2024-03-15 | Discharge: 2024-03-15 | Disposition: A | Discharge status: Home / Self Care | Source: Ambulatory Visit | Attending: Family Medicine | Admitting: Family Medicine

## 2024-03-15 DIAGNOSIS — R319 Hematuria, unspecified: Secondary | ICD-10-CM | POA: Diagnosis present

## 2024-04-10 ENCOUNTER — Encounter: Payer: Self-pay | Admitting: Family Medicine

## 2024-04-10 ENCOUNTER — Ambulatory Visit: Admitting: Family Medicine

## 2024-04-10 VITALS — BP 155/93 | HR 68 | Temp 97.5°F | Resp 18 | Ht 62.0 in | Wt 147.0 lb

## 2024-04-10 DIAGNOSIS — M8088XD Other osteoporosis with current pathological fracture, vertebra(e), subsequent encounter for fracture with routine healing: Secondary | ICD-10-CM | POA: Diagnosis not present

## 2024-04-10 DIAGNOSIS — M8000XD Age-related osteoporosis with current pathological fracture, unspecified site, subsequent encounter for fracture with routine healing: Secondary | ICD-10-CM | POA: Diagnosis not present

## 2024-04-10 DIAGNOSIS — I10 Essential (primary) hypertension: Secondary | ICD-10-CM

## 2024-04-10 DIAGNOSIS — R0781 Pleurodynia: Secondary | ICD-10-CM

## 2024-04-10 MED ORDER — OXYCODONE-ACETAMINOPHEN 5-325 MG PO TABS: 1.0000 | ORAL_TABLET | Freq: Three times a day (TID) | ORAL | 0 refills | Status: DC | PRN

## 2024-04-10 NOTE — Assessment & Plan Note (Addendum)
 Added back hydrochlorothiazide  12.5mg  daily.  Continue amlodipine  10 mg, carvedilol  12.5 mg twice daily, Entresto  49-51 mg twice daily.  Recheck in a month.

## 2024-04-10 NOTE — Assessment & Plan Note (Signed)
 Has her third lumbar osteoporotic fracture.  Refilled oxycodone  5 mg #45 for a month.  She failed Fosamax  secondary to GERD but is not a candidate for infusion therapy until she is cleared by dentist.  Twyla her a list of dentist to take Medicaid insurance.

## 2024-04-10 NOTE — Progress Notes (Signed)
 Established Patient Office Visit  Subjective   Patient ID: Sheila [name], female    DOB: Dec 11, 1960  Age: 63 y.o. MRN: [id number]  Chief Complaint  Patient presents with   Medical Management of Chronic Issues    HPI Sheila [name] 63 year old with SVT/paroxysmal atrial fibs/flutter (on Eliquis ), HTN (HFpEF), COPD (still smoking), GERD, OSA (untreated), PAD (subclavian occlusion), osteoporosis (12/02/2021 T equals -2.6, compression fractures T8, T9 and L2.  Failed Fosamax  secondary to gastritis), vitamin D  deficiency, s/p parathyroidectomy, gross hematuria 6/25 (normal bladder US , referral to Urology)  Discussed the use of AI scribe software for clinical note transcription with the patient, who gave verbal consent to proceed.  History of Present Illness   Sheila [name] is a 63 year old female who presents with back pain radiating to her hips and legs.She has another osteoporotic fracture in her spine, L2.    She experiences persistent back pain that radiates into her hips and legs. She takes pain medication primarily at night to aid with rest and occasionally during the day when the pain is severe.  She has noticed bright red blood in her urine and underwent a renal ultrasound, which returned normal results. Despite varying her water intake, she experiences persistent bladder discomfort and frequently needs to use the bathroom. Urine culture last month did not reveal an infection despite dysuria.    Her breathing has been stable but is affected by sinus issues. She confirms adherence to her prescribed blood pressure medication.  She is on Coreg  12.5 mg twice daily, Entresto  49-51 twice daily and amlodipine  10 mg daily.  Recheck of her blood pressure is 155/93.    Objective:     BP (!) 155/93   Pulse 68   Temp (!) 97.5 F (36.4 C) (Oral)   Resp 18   Ht 5' 2 (1.575 m)   Wt 147 lb (66.7 kg)   LMP 08/09/2008 (Within Years)   SpO2 96%   BMI 26.89 kg/m    Physical Exam Vitals and  nursing note reviewed.  Constitutional:      Appearance: Normal appearance.  HENT:     Head: Normocephalic and atraumatic.  Eyes:     Conjunctiva/sclera: Conjunctivae normal.  Cardiovascular:     Rate and Rhythm: Normal rate and regular rhythm.  Pulmonary:     Effort: Pulmonary effort is normal.     Breath sounds: Normal breath sounds.  Musculoskeletal:     Right lower leg: No edema.     Left lower leg: No edema.  Skin:    General: Skin is warm and dry.  Neurological:     Mental Status: She is alert and oriented to person, place, and time.  Psychiatric:        Mood and Affect: Mood normal.        Behavior: Behavior normal.        Thought Content: Thought content normal.        Judgment: Judgment normal.          No results found for any visits on 04/10/24.    The ASCVD Risk score (Arnett DK, et al., 2019) failed to calculate for the following reasons:   Risk score cannot be calculated because patient has a medical history suggesting prior/existing ASCVD    Assessment & Plan:  Osteoporotic compression fracture of vertebra with routine healing, subsequent encounter -     Drug Screen 12+Alcohol+CRT, Ur -     oxyCODONE -Acetaminophen ; Take 1 tablet by mouth every 8 (eight)  hours as needed for severe pain (pain score 7-10).  Dispense: 45 tablet; Refill: 0  Rib pain on right side -     oxyCODONE -Acetaminophen ; Take 1 tablet by mouth every 8 (eight) hours as needed for severe pain (pain score 7-10).  Dispense: 45 tablet; Refill: 0  Essential hypertension Assessment & Plan: Added back hydrochlorothiazide  12.5mg  daily.  Continue amlodipine  10 mg, carvedilol  12.5 mg twice daily, Entresto  49-51 mg twice daily.  Recheck in a month.     Age-related osteoporosis with current pathological fracture with routine healing, subsequent encounter Assessment & Plan: Has her third lumbar osteoporotic fracture.  Refilled oxycodone  5 mg #45 for a month.  She failed Fosamax  secondary to GERD  but is not a candidate for infusion therapy until she is cleared by dentist.  Twyla her a list of dentist to take Medicaid insurance.      Return in about 4 weeks (around 05/08/2024).    Corianne Buccellato K Danea Manter, MD

## 2024-04-13 ENCOUNTER — Other Ambulatory Visit: Payer: Self-pay | Admitting: Family Medicine

## 2024-04-13 DIAGNOSIS — F419 Anxiety disorder, unspecified: Secondary | ICD-10-CM

## 2024-04-16 NOTE — Telephone Encounter (Unsigned)
 Copied from CRM [id number]. Topic: Clinical - Prescription Issue >> Apr 16, 2024 10:16 AM Robinson H wrote: Reason for CRM: Patient following up on refill request for her ALPRAZolam  (XANAX ) 0.5 MG tablet request received on 9/5.  Sheila [name] [phone]

## 2024-04-17 ENCOUNTER — Ambulatory Visit: Admitting: Urology

## 2024-04-17 VITALS — BP 140/72 | HR 76 | Ht 62.0 in | Wt 144.0 lb

## 2024-04-17 DIAGNOSIS — R3915 Urgency of urination: Secondary | ICD-10-CM

## 2024-04-17 DIAGNOSIS — N3941 Urge incontinence: Secondary | ICD-10-CM

## 2024-04-17 DIAGNOSIS — R310 Gross hematuria: Secondary | ICD-10-CM

## 2024-04-17 DIAGNOSIS — R350 Frequency of micturition: Secondary | ICD-10-CM

## 2024-04-17 LAB — URINALYSIS, COMPLETE
Bilirubin, UA: NEGATIVE
Glucose, UA: NEGATIVE
Ketones, UA: NEGATIVE
Leukocytes,UA: NEGATIVE
Nitrite, UA: POSITIVE — AB
Protein,UA: NEGATIVE
Specific Gravity, UA: 1.015 (ref 1.005–1.030)
Urobilinogen, Ur: 0.2 mg/dL (ref 0.2–1.0)
pH, UA: 6 (ref 5.0–7.5)

## 2024-04-17 LAB — MICROSCOPIC EXAMINATION
Epithelial Cells (non renal): >10 /HPF — AB (ref 0–10)
RBC, Urine: >30 /HPF — AB (ref 0–2)

## 2024-04-17 LAB — BLADDER SCAN AMB NON-IMAGING

## 2024-04-17 MED ORDER — GEMTESA 75 MG PO TABS: 75.0000 mg | ORAL_TABLET | Freq: Every day | ORAL | Status: AC

## 2024-04-17 NOTE — Patient Instructions (Signed)

## 2024-04-17 NOTE — Progress Notes (Signed)
 04/17/2024 2:40 PM   [name] [birth year] [id number]  Referring provider: Ziglar, Susan K, MD [address] [place],  [place] [address]  Chief Complaint  Patient presents with   Hematuria    HPI: Sheila [name] is a 63 y.o. fe female referred for evaluation and management of hematuria  Urinalysis: Dipstick positive UAs for blood; microscopy not ordered Gross hematuria: Gross hematuria associated with dysuria 12/14/2023; urine culture grew multiple organisms, none predominant Associated lower urinary tract symptoms: Dysuria Baseline lower urinary tract symptoms: Frequency, urgency, urge incontinence Pain: Denied flank, abdominal or pelvic pain Prior urologic history: Blood thinners/antiplatelet meds: Eliquis  Tobacco history: Current smoker 30+ pack years Prior imaging: RUS 03/15/2024 unremarkable; CT abdomen pelvis with contrast 05/2023 without significant abnormalities   PMH: Past Medical History:  Diagnosis Date   Aorto-iliac atherosclerosis (HCC) 01/06/2016   Asthma    COPD (chronic obstructive pulmonary disease) (HCC)    Hypertension    Osteoporosis    Tobacco abuse 01/06/2016   Vitamin D  deficiency 01/06/2016    Surgical History: Past Surgical History:  Procedure Laterality Date   ANKLE FRACTURE SURGERY Left 11/07/2009   ATRIAL FIBRILLATION ABLATION N/A 09/12/2023   Procedure: ATRIAL FIBRILLATION ABLATION;  Surgeon: Sheila Ole DASEN, MD;  Location: [facility];  Service: Cardiovascular;  Laterality: N/A;   left ankle hardware removal     left ankle surgery     LEFT HEART CATH AND CORONARY ANGIOGRAPHY N/A 11/29/2022   Procedure: LEFT HEART CATH AND CORONARY ANGIOGRAPHY;  Surgeon: Sheila Deatrice LABOR, MD;  Location: [facility];  Service: Cardiovascular;  Laterality: N/A;   LOWER EXTREMITY ANGIOGRAPHY Left 10/17/2017   Procedure: LOWER EXTREMITY ANGIOGRAPHY;  Surgeon: Sheila Selinda RAMAN, MD;  Location: [facility];  Service: Cardiovascular;   Laterality: Left;   TUBAL LIGATION      Home Medications:  Allergies as of 04/17/2024       Reactions   Peach Flavoring Agent (non-screening) Shortness Of Breath   Vicodin [hydrocodone-acetaminophen ] Nausea And Vomiting   Vicodin [hydrocodone-acetaminophen ]    Abd pain, N/V   Hct [hydrochlorothiazide ] Palpitations   Tramadol Palpitations        Medication List        Accurate as of April 17, 2024  2:40 PM. If you have any questions, ask your nurse or doctor.          albuterol  (2.5 MG/3ML) 0.083% nebulizer solution Commonly known as: PROVENTIL  Take 3 mLs (2.5 mg total) by nebulization every 4 (four) hours as needed for wheezing or shortness of breath.   alendronate  70 MG tablet Commonly known as: FOSAMAX  Take 70 mg by mouth once a week.   ALPRAZolam  0.5 MG tablet Commonly known as: XANAX  Take 1 tablet (0.5 mg total) by mouth 4 (four) times daily as needed for anxiety.   amLODipine  10 MG tablet Commonly known as: NORVASC  Take 1 tablet (10 mg total) by mouth daily.   aspirin  EC 81 MG tablet Take 1 tablet (81 mg total) by mouth daily. Swallow whole.   atorvastatin  80 MG tablet Commonly known as: LIPITOR Take 1 tablet (80 mg total) by mouth daily.   Breztri  Aerosphere 160-9-4.8 MCG/ACT Aero inhaler Generic drug: budesonide-glycopyrrolate-formoterol Inhale 2 puffs into the lungs in the morning and at bedtime.   carvedilol  12.5 MG tablet Commonly known as: COREG  Take 1 tablet (12.5 mg total) by mouth 2 (two) times daily.   cetirizine  10 MG tablet Commonly known as: ZYRTEC  Take 1  tablet (10 mg total) by mouth daily.   Eliquis  5 MG Tabs tablet Generic drug: apixaban  TAKE 1 TABLET BY MOUTH TWICE A DAY   Entresto  49-51 MG Generic drug: sacubitril-valsartan Take 1 tablet by mouth 2 (two) times daily.   hydrochlorothiazide  25 MG tablet Commonly known as: HYDRODIURIL  Take 1 tablet (25 mg total) by mouth daily.   methocarbamol  500 MG tablet Commonly  known as: ROBAXIN  Take 2 tablets (1,000 mg total) by mouth 3 (three) times daily.   ondansetron  4 MG disintegrating tablet Commonly known as: ZOFRAN -ODT Take 1 tablet (4 mg total) by mouth every 8 (eight) hours as needed for nausea or vomiting.   oxyCODONE -acetaminophen  5-325 MG tablet Commonly known as: PERCOCET/ROXICET Take 1 tablet by mouth every 8 (eight) hours as needed for severe pain (pain score 7-10).   pantoprazole  40 MG tablet Commonly known as: Protonix  Take 1 tablet (40 mg total) by mouth daily.   pantoprazole  40 MG tablet Commonly known as: PROTONIX  Take 1 tablet (40 mg total) by mouth daily.   potassium chloride  SA 20 MEQ tablet Commonly known as: KLOR-CON  M Take 1 tablet (20 mEq total) by mouth 2 (two) times daily.   Vitamin D3 50 MCG (2000 UT) capsule Take by mouth.        Allergies:  Allergies  Allergen Reactions   Peach Flavoring Agent (Non-Screening) Shortness Of Breath   Vicodin [Hydrocodone-Acetaminophen ] Nausea And Vomiting   Vicodin [Hydrocodone-Acetaminophen ]     Abd pain, N/V   Hct [Hydrochlorothiazide ] Palpitations   Tramadol Palpitations    Family History: Family History  Problem Relation Age of Onset   COPD Mother    Heart failure Mother    COPD Father    Heart failure Father     Social History:  reports that she has been smoking cigarettes. She has a 30 pack-year smoking history. She has been exposed to tobacco smoke. She has never used smokeless tobacco. She reports that she does not drink alcohol and does not use drugs.   Physical Exam: BP (!) 140/72   Pulse 76   Ht 5' 2 (1.575 m)   Wt 144 lb (65.3 kg)   LMP 08/09/2008 (Within Years)   BMI 26.34 kg/m   Constitutional:  Alert and oriented, No acute distress. HEENT: [place] AT. Respiratory: Normal respiratory effort, no increased work of breathing. Psychiatric: Normal mood and affect.  Laboratory Data:  Urinalysis Dipstick 2+ blood/nitrite + Microscopy 6-10 WBC/>30 RBC/>10  epis   Assessment & Plan:    1.  Gross hematuria AUA risk stratification: High We discussed the recommended evaluation of high risk hematuria which consist of CT urogram and cystoscopy.  The procedures were discussed in detail and she has elected to proceed with further evaluation All questions were answered CTU order placed and cystoscopy was scheduled Urine culture ordered  2.  Lower urinary tract symptoms Storage related voiding symptoms with urge incontinence as above PVR 82 mL Trial Gemtesa  75 mg daily-samples given    Breauna Mazzeo C Sayvon Arterberry, MD  [facility] [address] [place], [place] [address] [phone]

## 2024-04-19 ENCOUNTER — Ambulatory Visit: Admitting: "Endocrinology

## 2024-04-24 LAB — CULTURE, URINE COMPREHENSIVE

## 2024-04-25 ENCOUNTER — Ambulatory Visit

## 2024-04-25 ENCOUNTER — Ambulatory Visit: Payer: Self-pay | Admitting: Urology

## 2024-04-25 ENCOUNTER — Ambulatory Visit: Admitting: Cardiology

## 2024-04-26 ENCOUNTER — Other Ambulatory Visit: Payer: Self-pay | Admitting: *Deleted

## 2024-04-26 MED ORDER — SULFAMETHOXAZOLE-TRIMETHOPRIM 800-160 MG PO TABS
1.0000 | ORAL_TABLET | Freq: Two times a day (BID) | ORAL | 0 refills | Status: AC
Start: 2024-04-26 — End: 2024-05-03

## 2024-05-08 ENCOUNTER — Ambulatory Visit
Admission: RE | Admit: 2024-05-08 | Discharge: 2024-05-08 | Disposition: A | Discharge status: Home / Self Care | Source: Ambulatory Visit | Attending: Urology | Admitting: Urology

## 2024-05-08 DIAGNOSIS — R310 Gross hematuria: Secondary | ICD-10-CM | POA: Diagnosis present

## 2024-05-08 MED ORDER — IOHEXOL 300 MG/ML  SOLN
100.0000 mL | Freq: Once | INTRAMUSCULAR | Status: AC | PRN
Start: 2024-05-08 — End: 2024-05-08
  Administered 2024-05-08: 100 mL via INTRAVENOUS

## 2024-05-10 ENCOUNTER — Ambulatory Visit (INDEPENDENT_AMBULATORY_CARE_PROVIDER_SITE_OTHER): Admitting: Family Medicine

## 2024-05-10 ENCOUNTER — Encounter: Payer: Self-pay | Admitting: Family Medicine

## 2024-05-10 VITALS — BP 154/88 | HR 63 | Temp 97.6°F | Resp 18 | Ht 62.0 in | Wt 146.0 lb

## 2024-05-10 DIAGNOSIS — Z2821 Immunization not carried out because of patient refusal: Secondary | ICD-10-CM | POA: Insufficient documentation

## 2024-05-10 DIAGNOSIS — R0789 Other chest pain: Secondary | ICD-10-CM | POA: Diagnosis not present

## 2024-05-10 DIAGNOSIS — E559 Vitamin D deficiency, unspecified: Secondary | ICD-10-CM

## 2024-05-10 DIAGNOSIS — I10 Essential (primary) hypertension: Secondary | ICD-10-CM

## 2024-05-10 DIAGNOSIS — M8088XD Other osteoporosis with current pathological fracture, vertebra(e), subsequent encounter for fracture with routine healing: Secondary | ICD-10-CM | POA: Diagnosis not present

## 2024-05-10 MED ORDER — OXYCODONE-ACETAMINOPHEN 5-325 MG PO TABS: 1.0000 | ORAL_TABLET | Freq: Three times a day (TID) | ORAL | 0 refills | Status: DC | PRN

## 2024-05-10 NOTE — Assessment & Plan Note (Signed)
 She declined the flu vaccine and the pneumococcal vaccine

## 2024-05-10 NOTE — Progress Notes (Addendum)
 Established Patient Office Visit  Subjective   Patient ID: Sheila [name], female    DOB: 06-Jun-1961  Age: 63 y.o. MRN: [id number]  Chief Complaint  Patient presents with   Medical Management of Chronic Issues    HPI Discussed the use of AI scribe software for clinical note transcription with the patient, who gave verbal consent to proceed.  History of Present Illness   Sheila [name] is a 63 year old female with atrial fibrillation and osteoporosis who presents for follow-up of multiple medical issues.  She experiences intermittent episodes of atrial fibrillation lasting a few minutes, sometimes accompanied by chest pain. No significant changes in her breathing are noted.  She uses Breztri  twice daily, which she finds effective, and a nebulizer with albuterol  approximately once a day or less.  Her osteoporosis remains stable, but she is unable to receive treatment due to dental issues. She had a negative experience with a previous endocrinologist and is considering returning to a previous provider in [place] who conducted her last bone density test.  She reports she is taking vitamin D  now.  She recently experienced gross hematuria and has been to urology for evaluation.  And underwent a CT scan two days ago, she also had bacteria in her urine and she is currently on antibiotics and has follow-up appointments scheduled for further urine testing and a cystoscopy.  She experiences intermittent musculoskeletal pain, particularly in her shoulder, which she attributes to a pulled muscle. Her back pain varies with movement. She has tried methocarbamol  for muscle relaxation without relief and finds ibuprofen  or Tylenol  more effective.  She has chronic sinus issues year-round.  She is not currently participating in a lung cancer screening program but plans to follow up with her Pulmonologist regarding this.  She has a history of smoking and denies alcohol use.      ROS     Objective:     BP (!) 154/88 (BP Location: Left Arm, Patient Position: Sitting, Cuff Size: Normal)   Pulse 63   Temp 97.6 F (36.4 C) (Oral)   Resp 18   Ht 5' 2 (1.575 m)   Wt 146 lb (66.2 kg)   LMP 08/09/2008 (Within Years)   SpO2 95%   BMI 26.70 kg/m    Physical Exam Vitals reviewed.  Constitutional:      Appearance: Normal appearance.  HENT:     Head: Normocephalic.  Eyes:     General:        Right eye: No discharge.        Left eye: No discharge.  Cardiovascular:     Rate and Rhythm: Normal rate.  Pulmonary:     Effort: Pulmonary effort is normal.  Neurological:     Mental Status: She is alert and oriented to person, place, and time.  Psychiatric:        Mood and Affect: Mood normal.        Behavior: Behavior normal.        Thought Content: Thought content normal.        Judgment: Judgment normal.          No results found for any visits on 05/10/24.    The ASCVD Risk score (Arnett DK, et al., 2019) failed to calculate for the following reasons:   Risk score cannot be calculated because patient has a medical history suggesting prior/existing ASCVD    Assessment & Plan:  Vitamin D  deficiency -     VITAMIN D  25  Hydroxy (Vit-D Deficiency, Fractures)  Osteoporotic compression fracture of vertebra with routine healing, subsequent encounter -     oxyCODONE -Acetaminophen ; Take 1 tablet by mouth every 8 (eight) hours as needed for severe pain (pain score 7-10).  Dispense: 45 tablet; Refill: 0  Rib pain on right side -     oxyCODONE -Acetaminophen ; Take 1 tablet by mouth every 8 (eight) hours as needed for severe pain (pain score 7-10).  Dispense: 45 tablet; Refill: 0  Essential hypertension -     CBC with Differential/Platelet -     Comprehensive metabolic panel with GFR -     Lipid panel  Immunization declined Assessment & Plan: She declined the flu vaccine and the pneumococcal vaccine         Lionel Woodberry K Sae Handrich, MD

## 2024-05-11 ENCOUNTER — Ambulatory Visit: Payer: Self-pay | Admitting: Family Medicine

## 2024-05-11 LAB — LIPID PANEL
Chol/HDL Ratio: 4.1 ratio (ref 0.0–4.4)
Cholesterol, Total: 196 mg/dL (ref 100–199)
HDL: 48 mg/dL (ref >39)
LDL Chol Calc (NIH): 124 mg/dL — ABNORMAL HIGH (ref 0–99)
Triglycerides: 134 mg/dL (ref 0–149)
VLDL Cholesterol Cal: 24 mg/dL (ref 5–40)

## 2024-05-11 LAB — COMPREHENSIVE METABOLIC PANEL WITH GFR
ALT: 8 IU/L (ref 0–32)
AST: 12 IU/L (ref 0–40)
Albumin: 4.1 g/dL (ref 3.9–4.9)
Alkaline Phosphatase: 94 IU/L (ref 49–135)
BUN/Creatinine Ratio: 13 (ref 12–28)
BUN: 8 mg/dL (ref 8–27)
Bilirubin Total: 0.2 mg/dL (ref 0.0–1.2)
CO2: 20 mmol/L (ref 20–29)
Calcium: 10.4 mg/dL — ABNORMAL HIGH (ref 8.7–10.3)
Chloride: 105 mmol/L (ref 96–106)
Creatinine, Ser: 0.64 mg/dL (ref 0.57–1.00)
Globulin, Total: 2.5 g/dL (ref 1.5–4.5)
Glucose: 98 mg/dL (ref 70–99)
Potassium: 4.2 mmol/L (ref 3.5–5.2)
Sodium: 139 mmol/L (ref 134–144)
Total Protein: 6.6 g/dL (ref 6.0–8.5)
eGFR: 99 mL/min/1.73 (ref >59)

## 2024-05-11 LAB — CBC WITH DIFFERENTIAL/PLATELET
Basophils Absolute: 0.1 x10E3/uL (ref 0.0–0.2)
Basos: 1 %
EOS (ABSOLUTE): 0.2 x10E3/uL (ref 0.0–0.4)
Eos: 2 %
Hematocrit: 40.9 % (ref 34.0–46.6)
Hemoglobin: 13 g/dL (ref 11.1–15.9)
Immature Grans (Abs): 0 x10E3/uL (ref 0.0–0.1)
Immature Granulocytes: 0 %
Lymphocytes Absolute: 2.9 x10E3/uL (ref 0.7–3.1)
Lymphs: 32 %
MCH: 30.2 pg (ref 26.6–33.0)
MCHC: 31.8 g/dL (ref 31.5–35.7)
MCV: 95 fL (ref 79–97)
Monocytes Absolute: 0.8 x10E3/uL (ref 0.1–0.9)
Monocytes: 9 %
Neutrophils Absolute: 5.1 x10E3/uL (ref 1.4–7.0)
Neutrophils: 56 %
Platelets: 519 x10E3/uL — ABNORMAL HIGH (ref 150–450)
RBC: 4.3 x10E6/uL (ref 3.77–5.28)
RDW: 13 % (ref 11.7–15.4)
WBC: 9.1 x10E3/uL (ref 3.4–10.8)

## 2024-05-11 LAB — VITAMIN D 25 HYDROXY (VIT D DEFICIENCY, FRACTURES): Vit D, 25-Hydroxy: 16.3 ng/mL — ABNORMAL LOW (ref 30.0–100.0)

## 2024-05-17 ENCOUNTER — Other Ambulatory Visit: Payer: Self-pay

## 2024-05-17 DIAGNOSIS — R310 Gross hematuria: Secondary | ICD-10-CM

## 2024-05-17 NOTE — Addendum Note (Signed)
 Addended byBETHA CORIE PLATER on: 05/17/2024 03:48 PM   Modules accepted: Orders

## 2024-05-18 ENCOUNTER — Other Ambulatory Visit

## 2024-05-18 DIAGNOSIS — R310 Gross hematuria: Secondary | ICD-10-CM

## 2024-05-22 NOTE — Progress Notes (Unsigned)
  Electrophysiology Office Follow up Visit Note:    Date:  05/23/2024   ID:  Sheila [name], DOB March 05, 1961, MRN [id number]  PCP:  Ziglar, Susan K, MD  [facility] Cardiologist:  Evalene Lunger, MD  [facility] Electrophysiologist:  OLE ONEIDA HOLTS, MD    Interval History:     Sheila [name] is a 63 y.o. female who presents for a follow up visit.   I last saw the patient November 09, 2023.  She has a history of SVT, atrial fibrillation, atrial flutter, hypertension, diastolic heart failure.  She was previously on amiodarone .  An ablation was previously aborted because of severe interatrial hypertrophy prohibiting safe transseptal crossing.  At the last appointment we decided to stop amiodarone  to allow for adequate washout.  If she had recurrence, planned to start dofetilide.  She is doing okay.  She reports palpitations that last seconds to minutes at a time every other day.  She takes her Eliquis  as prescribed without bleeding issues.      Past medical, surgical, social and family history were reviewed.  ROS:   Please see the history of present illness.    All other systems reviewed and are negative.  EKGs/Labs/Other Studies Reviewed:    The following studies were reviewed today:     EKG Interpretation Date/Time:  Wednesday May 23 2024 11:02:32 EDT Ventricular Rate:  63 PR Interval:  184 QRS Duration:  92 QT Interval:  428 QTC Calculation: 437 R Axis:   -32  Text Interpretation: Sinus rhythm with marked sinus arrhythmia Confirmed by HOLTS OLE [phone]) on 05/23/2024 11:03:39 AM    Physical Exam:    VS:  BP 116/72 (BP Location: Right Arm, Patient Position: Sitting, Cuff Size: Normal)   Pulse 63   Ht 5' 2 (1.575 m)   Wt 145 lb 4 oz (65.9 kg)   LMP 08/09/2008 (Within Years)   SpO2 93%   BMI 26.57 kg/m     Wt Readings from Last 3 Encounters:  05/23/24 145 lb 4 oz (65.9 kg)  05/10/24 146 lb (66.2 kg)  04/17/24 144 lb (65.3 kg)     GEN: no  distress CARD: RRR, No MRG RESP: No IWOB. CTAB.      ASSESSMENT:    1. Atrial flutter, unspecified type (HCC)   2. PAF (paroxysmal atrial fibrillation) (HCC)   3. Essential hypertension    PLAN:    In order of problems listed above:  #Atrial fibrillation and flutter Previously treated with amiodarone  but this was stopped to allow for washout.  She reports palpitations on average every other day that last for seconds to minutes at a time.  It is unclear what is causing these symptoms.  I will have her wear a 2-week ZIO monitor and follow-up with an APP in 6 to 8 weeks.  If she is having more atrial fibrillation, favor starting Tikosyn.  If Tikosyn is to be started, she will need to stop her hydrochlorothiazide  and use an alternative antihypertensive medication.  #Hypertension At goal today.  Recommend checking blood pressures 1-2 times per week at home and recording the values.  Recommend bringing these recordings to the primary care physician.  I discussed my upcoming departure from [facility] during today's clinic appointment.  I will transition her care to one of my partners, Dr. Kennyth.  Follow-up with APP in 8 weeks.   Signed, OLE HOLTS, MD, [facility], [facility] 05/23/2024 11:10 AM    Electrophysiology [facility]

## 2024-05-23 ENCOUNTER — Ambulatory Visit (INDEPENDENT_AMBULATORY_CARE_PROVIDER_SITE_OTHER)

## 2024-05-23 ENCOUNTER — Ambulatory Visit: Attending: Cardiology | Admitting: Cardiology

## 2024-05-23 ENCOUNTER — Encounter: Payer: Self-pay | Admitting: Cardiology

## 2024-05-23 VITALS — BP 116/72 | HR 63 | Ht 62.0 in | Wt 145.2 lb

## 2024-05-23 DIAGNOSIS — I4892 Unspecified atrial flutter: Secondary | ICD-10-CM | POA: Insufficient documentation

## 2024-05-23 DIAGNOSIS — I10 Essential (primary) hypertension: Secondary | ICD-10-CM | POA: Diagnosis not present

## 2024-05-23 DIAGNOSIS — I480 Paroxysmal atrial fibrillation: Secondary | ICD-10-CM

## 2024-05-23 LAB — CULTURE, URINE COMPREHENSIVE

## 2024-05-23 NOTE — Patient Instructions (Signed)
 Medication Instructions:  Your physician recommends that you continue on your current medications as directed. Please refer to the Current Medication list given to you today.  *If you need a refill on your cardiac medications before your next appointment, please call your pharmacy*  Testing/Procedures: Event Monitor Your physician has recommended that you wear an event monitor. Event monitors are medical devices that record the heart's electrical activity. Doctors most often us  these monitors to diagnose arrhythmias. Arrhythmias are problems with the speed or rhythm of the heartbeat. The monitor is a small, portable device. You can wear one while you do your normal daily activities. This is usually used to diagnose what is causing palpitations/syncope (passing out).  Follow-Up: At [facility], you and your health needs are our priority.  As part of our continuing mission to provide you with exceptional heart care, our providers are all part of one team.  This team includes your primary Cardiologist (physician) and Advanced Practice Providers or APPs (Physician Assistants and Nurse Practitioners) who all work together to provide you with the care you need, when you need it.  Your next appointment:   8 weeks  Provider:   Suzann Riddle, NP     Other Instructions ZIO XT- Long Term Monitor Instructions  Your physician has requested you wear a ZIO patch monitor for 14 days.  This is a single patch monitor. [employer] supplies one patch monitor per enrollment. Additional stickers are not available. Please do not apply patch if you will be having a Nuclear Stress Test,  Echocardiogram, Cardiac CT, MRI, or Chest Xray during the period you would be wearing the  monitor. The patch cannot be worn during these tests. You cannot remove and re-apply the  ZIO XT patch monitor.  Your ZIO patch monitor will be mailed 3 day USPS to your address on file. It may take 3-5 days  to receive your monitor  after you have been enrolled.  Once you have received your monitor, please review the enclosed instructions. Your monitor  has already been registered assigning a specific monitor serial # to you.  Billing and Patient Assistance Program Information  We have supplied [employer] with any of your insurance information on file for billing purposes. [employer] offers a sliding scale Patient Assistance Program for patients that do not have  insurance, or whose insurance does not completely cover the cost of the ZIO monitor.  You must apply for the Patient Assistance Program to qualify for this discounted rate.  To apply, please call [employer] at [phone], select option 4, select option 2, ask to apply for  Patient Assistance Program. Meredeth will ask your household income, and how many people  are in your household. They will quote your out-of-pocket cost based on that information.  [employer] will also be able to set up a 74-month, interest-free payment plan if needed.  Applying the monitor   Shave hair from upper left chest.  Hold abrader disc by orange tab. Rub abrader in 40 strokes over the upper left chest as  indicated in your monitor instructions.  Clean area with 4 enclosed alcohol pads. Let dry.  Apply patch as indicated in monitor instructions. Patch will be placed under collarbone on left  side of chest with arrow pointing upward.  Rub patch adhesive wings for 2 minutes. Remove white label marked 1. Remove the white  label marked 2. Rub patch adhesive wings for 2 additional minutes.  While looking in a mirror, press and release button in center  of patch. A small green light will  flash 3-4 times. This will be your only indicator that the monitor has been turned on.  Do not shower for the first 24 hours. You may shower after the first 24 hours.  Press the button if you feel a symptom. You will hear a small click. Record Date, Time and  Symptom in the Patient Logbook.  When you are  ready to remove the patch, follow instructions on the last 2 pages of Patient  Logbook. Stick patch monitor onto the last page of Patient Logbook.  Place Patient Logbook in the blue and white box. Use locking tab on box and tape box closed  securely. The blue and white box has prepaid postage on it. Please place it in the mailbox as  soon as possible. Your physician should have your test results approximately 7 days after the  monitor has been mailed back to [facility].  Call [facility] Customer Care at [phone] if you have questions regarding  your ZIO XT patch monitor. Call them immediately if you see an orange light blinking on your  monitor.  If your monitor falls off in less than 4 days, contact our Monitor department at [phone].  If your monitor becomes loose or falls off after 4 days call [employer] at [phone] for  suggestions on securing your monitor

## 2024-05-30 ENCOUNTER — Encounter: Payer: Self-pay | Admitting: Urology

## 2024-05-30 ENCOUNTER — Ambulatory Visit: Admitting: Urology

## 2024-05-30 VITALS — BP 147/85 | HR 90 | Ht 62.0 in | Wt 144.0 lb

## 2024-05-30 DIAGNOSIS — R310 Gross hematuria: Secondary | ICD-10-CM

## 2024-05-30 LAB — URINALYSIS, COMPLETE
Bilirubin, UA: NEGATIVE
Glucose, UA: NEGATIVE
Ketones, UA: NEGATIVE
Nitrite, UA: POSITIVE — AB
Specific Gravity, UA: 1.015 (ref 1.005–1.030)
Urobilinogen, Ur: 0.2 mg/dL (ref 0.2–1.0)
pH, UA: 6 (ref 5.0–7.5)

## 2024-05-30 LAB — MICROSCOPIC EXAMINATION

## 2024-05-30 MED ORDER — NITROFURANTOIN MONOHYD MACRO 100 MG PO CAPS: 100.0000 mg | ORAL_CAPSULE | Freq: Two times a day (BID) | ORAL | 0 refills | Status: AC

## 2024-05-30 NOTE — Progress Notes (Signed)
 For cystoscopy today.  She is having dysuria, frequency and urgency.  Urinalysis was nitrite positive with pyuria.  Urine culture ordered and Rx sent to pharmacy pending culture report.  Cystoscopy will be rescheduled

## 2024-06-06 LAB — CULTURE, URINE COMPREHENSIVE

## 2024-06-08 ENCOUNTER — Other Ambulatory Visit: Payer: Self-pay | Admitting: Urology

## 2024-06-08 ENCOUNTER — Encounter: Payer: Self-pay | Admitting: Family Medicine

## 2024-06-08 ENCOUNTER — Ambulatory Visit: Payer: Self-pay | Admitting: Urology

## 2024-06-08 ENCOUNTER — Ambulatory Visit: Admitting: Family Medicine

## 2024-06-08 VITALS — BP 174/109 | HR 81 | Temp 97.8°F | Resp 18 | Ht 62.0 in | Wt 146.0 lb

## 2024-06-08 DIAGNOSIS — I10 Essential (primary) hypertension: Secondary | ICD-10-CM | POA: Diagnosis not present

## 2024-06-08 DIAGNOSIS — M8088XD Other osteoporosis with current pathological fracture, vertebra(e), subsequent encounter for fracture with routine healing: Secondary | ICD-10-CM | POA: Diagnosis not present

## 2024-06-08 MED ORDER — OXYCODONE-ACETAMINOPHEN 5-325 MG PO TABS: 1.0000 | ORAL_TABLET | Freq: Three times a day (TID) | ORAL | 0 refills | Status: DC | PRN

## 2024-06-08 MED ORDER — CLONIDINE HCL 0.1 MG PO TABS
0.1000 mg | ORAL_TABLET | Freq: Once | ORAL | Status: AC
  Administered 2024-06-08: 0.1 mg via ORAL

## 2024-06-08 MED ORDER — SULFAMETHOXAZOLE-TRIMETHOPRIM 800-160 MG PO TABS: ORAL_TABLET | ORAL | 0 refills | Status: AC

## 2024-06-08 NOTE — Assessment & Plan Note (Addendum)
 Her blood pressure is very elevated today.  She reports that she has taken her blood pressure medicine correctly today.  She was given clonidine  0.1 twice and her blood pressure is still high.  She denies getting a salt load and she has not taken cold medicine.

## 2024-06-08 NOTE — Assessment & Plan Note (Signed)
 She has 3 vertebral fractures from osteoporosis.  She is on Eliquis  for paroxysmal atrial fibrillation so anti-inflammatories are contraindicated.  Oxycodone  5 mg 1-1/2 a day.

## 2024-06-08 NOTE — Progress Notes (Signed)
 Established Patient Office Visit  Subjective   Patient ID: Sheila [name], female    DOB: 1961/06/21  Age: 63 y.o. MRN: [id number]  Chief Complaint  Patient presents with   Medical Management of Chronic Issues    HPI Discussed the use of AI scribe software for clinical note transcription with the patient, who gave verbal consent to proceed.  History of Present Illness   Sheila [name] is a 63 year old female with hypertension who presents with concerns about her blood pressure and sinus issues.  She has been monitoring her blood pressure at home, noting fluctuations between the 130s to 160s over 70s. She consistently takes her prescribed blood pressure medication and has made efforts to reduce her salt intake, although she occasionally uses a small amount on foods like tomatoes and eggs.  She is experiencing persistent sinus issues characterized by nasal congestion and discharge. The nasal discharge is described as green and yellow. She has not used any nasal sprays such as Flonase  to manage these symptoms.  She has ongoing back pain, which she describes as constant. She has osteoporotic vertebral fractures.    She is currently on antibiotics for a bladder infection, which was identified after a recent urine test. A prior urine test was negative, but the subsequent test showed an infection. She is scheduled for a cystoscopy due to previous episodes of hematuria.  She has been wearing a heart monitor for two weeks as of the past Monday.       Objective:     BP (!) 174/109   Pulse 81   Temp 97.8 F (36.6 C) (Oral)   Resp 18   Ht 5' 2 (1.575 m)   Wt 146 lb (66.2 kg)   LMP 08/09/2008 (Within Years)   SpO2 93%   BMI 26.70 kg/m    Physical Exam Vitals and nursing note reviewed.  Constitutional:      Appearance: Normal appearance.  HENT:     Head: Normocephalic and atraumatic.  Eyes:     Conjunctiva/sclera: Conjunctivae normal.  Cardiovascular:     Rate and Rhythm:  Normal rate and regular rhythm.  Pulmonary:     Effort: Pulmonary effort is normal.     Breath sounds: Normal breath sounds.  Musculoskeletal:     Right lower leg: No edema.     Left lower leg: No edema.  Skin:    General: Skin is warm and dry.  Neurological:     Mental Status: She is alert and oriented to person, place, and time.  Psychiatric:        Mood and Affect: Mood normal.        Behavior: Behavior normal.        Thought Content: Thought content normal.        Judgment: Judgment normal.          No results found for any visits on 06/08/24.    The ASCVD Risk score (Arnett DK, et al., 2019) failed to calculate for the following reasons:   Risk score cannot be calculated because patient has a medical history suggesting prior/existing ASCVD    Assessment & Plan:  Essential hypertension Assessment & Plan: Her blood pressure is very elevated today.  She reports that she has taken her blood pressure medicine correctly today.  She was given clonidine  0.1 twice and her blood pressure is still high.  She denies getting a salt load and she has not taken cold medicine.  Orders: -  cloNIDine  HCl -     cloNIDine  HCl  Osteoporotic compression fracture of vertebra with routine healing, subsequent encounter -     oxyCODONE -Acetaminophen ; Take 1 tablet by mouth every 8 (eight) hours as needed.  Dispense: 45 tablet; Refill: 0 -     oxyCODONE -Acetaminophen ; Take 1 tablet by mouth every 8 (eight) hours as needed.  Dispense: 45 tablet; Refill: 0 -     oxyCODONE -Acetaminophen ; Take 1 tablet by mouth every 8 (eight) hours as needed.  Dispense: 45 tablet; Refill: 0     No follow-ups on file.    Zale Marcotte K Jakaylah Schlafer, MD

## 2024-06-20 DIAGNOSIS — I10 Essential (primary) hypertension: Secondary | ICD-10-CM | POA: Diagnosis not present

## 2024-06-20 DIAGNOSIS — I4892 Unspecified atrial flutter: Secondary | ICD-10-CM

## 2024-06-20 DIAGNOSIS — I480 Paroxysmal atrial fibrillation: Secondary | ICD-10-CM | POA: Diagnosis not present

## 2024-06-26 ENCOUNTER — Encounter: Payer: Self-pay | Admitting: Urology

## 2024-06-26 ENCOUNTER — Ambulatory Visit: Admitting: Urology

## 2024-06-26 VITALS — BP 118/76 | HR 96 | Ht 66.0 in | Wt 144.0 lb

## 2024-06-26 DIAGNOSIS — R310 Gross hematuria: Secondary | ICD-10-CM | POA: Diagnosis not present

## 2024-06-26 LAB — URINALYSIS, COMPLETE
Bilirubin, UA: NEGATIVE
Glucose, UA: NEGATIVE
Ketones, UA: NEGATIVE
Nitrite, UA: NEGATIVE
Protein,UA: NEGATIVE
Specific Gravity, UA: 1.015 (ref 1.005–1.030)
Urobilinogen, Ur: 0.2 mg/dL (ref 0.2–1.0)
pH, UA: 6 (ref 5.0–7.5)

## 2024-06-26 LAB — MICROSCOPIC EXAMINATION

## 2024-06-26 MED ORDER — GEMTESA 75 MG PO TABS: 75.0000 mg | ORAL_TABLET | Freq: Every day | ORAL | 0 refills | Status: AC

## 2024-06-26 MED ORDER — GEMTESA 75 MG PO TABS: 75.0000 mg | ORAL_TABLET | Freq: Every day | ORAL | Status: AC

## 2024-06-26 NOTE — Progress Notes (Signed)
   06/26/24  CC:  Chief Complaint  Patient presents with   Cysto    HPI: Refer to my prior note 04/17/2024.  CT urogram performed 05/08/2024 showed no upper tract abnormalities.  Cystoscopy initially scheduled 05/30/2024 was rescheduled secondary to Klebsiella UTI.  She felt the Gemtesa  did help her lower urinary tract symptoms and dysuria.  UA 6-10 WBC/3-10 RBC  Blood pressure 118/76, pulse 96, height 5' 6 (1.676 m), weight 144 lb (65.3 kg), last menstrual period 08/09/2008. NED. A&Ox3.   No respiratory distress   Abd soft, NT, ND Normal external genitalia with patent urethral meatus  Cystoscopy Procedure Note  Patient identification was confirmed, informed consent was obtained, and patient was prepped using Betadine solution.  Lidocaine  jelly was administered per urethral meatus.    Procedure: - Flexible cystoscope introduced, without any difficulty.   - Thorough search of the bladder revealed:    normal urethral meatus    normal urothelium    no stones    no ulcers     no tumors    no urethral polyps    Moderate trabeculation; wide mouth posterior wall diverticulum  - Ureteral orifices were normal in position and appearance.  Post-Procedure: - Patient tolerated the procedure well  Assessment/ Plan: No bladder mucosal abnormalities No upper tract abnormality CTU *** Pelvic/voiding symptoms improved on Gemtesa ; Rx sent to pharmacy to check insurance coverage 60-month follow-up   Sheila JAYSON Barba, MD

## 2024-06-27 ENCOUNTER — Ambulatory Visit: Admitting: "Endocrinology

## 2024-07-17 NOTE — Progress Notes (Unsigned)
 This encounter was created in error - please disregard.

## 2024-07-18 ENCOUNTER — Ambulatory Visit: Admitting: Cardiology

## 2024-07-18 DIAGNOSIS — I4892 Unspecified atrial flutter: Secondary | ICD-10-CM

## 2024-07-18 DIAGNOSIS — I480 Paroxysmal atrial fibrillation: Secondary | ICD-10-CM

## 2024-07-18 DIAGNOSIS — Z862 Personal history of diseases of the blood and blood-forming organs and certain disorders involving the immune mechanism: Secondary | ICD-10-CM

## 2024-07-22 NOTE — Progress Notes (Unsigned)
 Electrophysiology Clinic Note    Date:  07/22/2024  Patient ID:  Roshell, Brigham Jan 01, 1961, MRN [id number] PCP:  Ziglar, Susan K, MD  Cardiologist:  Evalene Lunger, MD  Electrophysiologist:  OLE ONEIDA HOLTS, MD    ***refresh  Discussed the use of AI scribe software for clinical note transcription with the patient, who gave verbal consent to proceed.   Patient Profile    Chief Complaint: ***  History of Present Illness: SEAN MACWILLIAMS is a 63 y.o. female with PMH notable for afib, aflutter, SVT, HTN, HFpEF, PAD s/p subclavian stenting, COPD, OSA, tobacco use; seen today for OLE ONEIDA HOLTS, MD for routine electrophysiology followup.   She is status post aborted AF ablation d/t 09/2023 due to severely thickened intra-atrial septum.  Her amiodarone  was stopped due to young age.  She last saw Dr. Holts 05/2024 at which time she was doing well with very rare atrial fibrillation episodes. He recommended updated Zio monitor to further evaluate atrial fibrillation burden.  On follow-up today, *** AF burden, symptoms *** palpitations *** bleeding concerns  *** CPAP usage?  Since last being seen in our clinic the patient reports doing ***.  she denies chest pain, palpitations, dyspnea, PND, orthopnea, nausea, vomiting, dizziness, syncope, edema, weight gain, or early satiety.         Arrhythmia/Device History Amiodarone  - stopped 09/2023     ROS:  Please see the history of present illness. All other systems are reviewed and otherwise negative.    Physical Exam    VS:  LMP 08/09/2008  BMI: There is no height or weight on file to calculate BMI.           Wt Readings from Last 3 Encounters:  06/26/24 144 lb (65.3 kg)  06/08/24 146 lb (66.2 kg)  05/30/24 144 lb (65.3 kg)     GEN- The patient is well appearing, alert and oriented x 3 today.   Lungs- Clear to ausculation bilaterally, normal work of breathing.  Heart- {Blank  single:19197::Regular,Irregularly irregular} rate and rhythm, no murmurs, rubs or gallops Extremities- {EDEMA LEVEL:28147::No} peripheral edema, warm, dry   Studies Reviewed   Previous EP, cardiology notes.    EKG is ordered. Personal review of EKG from today shows:  ***       Long term monitor, 06/19/2024 HR 27 to 211, average 85. 16 nonsustained SVT, longest 18 beats 4 pauses, longest 4.4 seconds Frequent supraventricular ectopy, 10.9% Rare ventricular ectopy. No sustained arrhythmias. No atrial fibrillation.  Cardiac MRI, 10/31/2023 1. Findings most consistent with severe lipomatous hypertrophy of the atrial septum.  2. Normal biventricular chamber size and function, LVEF 56%, RVEF 56%. Mild RV insertion point LGE on the ventricular septum, which may indicate elevated pulmonary pressures.  3.  No hemodynamically significant valvular heart disease.  Cardiac CT, 08/17/2023 1. There is normal pulmonary vein drainage into the left atrium. (2 on the right and 2 on the left) with ostial measurements as above.  2. The left atrial appendage is a Windsock type with ostial size 19 x 11 mm and length 26 mm, Area 16 mm2. There is no thrombus in the left atrial appendage.  3. The esophagus runs in the left atrial midline and is not in the proximity to any of the pulmonary veins.  4. Coronary calcium  score of 0.   Assessment and Plan    #) afib, aflutter   #) Hypercoag d/t *** afib CHA2DS2-VASc Score = at least 4 [CHF History:  1, HTN History: 1, Diabetes History: 0, Stroke History: 0, Vascular Disease History: 1, Age Score: 0, Gender Score: 1].  Therefore, the patient's annual risk of stroke is 4.8 %.     {Confirm score is correct.  If not, click here to update score.  REFRESH note.  :1}   Stroke ppx - 5mg  eliquis  BID, appropriately dosed No bleeding concerns Recent CBC stable    #) *** #) ***   #) ***   {Are you ordering a CV Procedure (e.g. stress test, cath, DCCV,  TEE, etc)?   Press F2        :[id number]}   Current medicines are reviewed at length with the patient today.   The patient {ACTIONS; HAS/DOES NOT HAVE:19233} concerns regarding her medicines.  The following changes were made today:  {NONE DEFAULTED:18576}  Labs/ tests ordered today include: *** No orders of the defined types were placed in this encounter.    Disposition: Follow up with {EPMDS:28135::EP Team} or EP APP {EPFOLLOW UP:28173}   Signed, Chantal Needle, NP  07/22/2024  11:01 AM  Electrophysiology [facility]

## 2024-07-23 ENCOUNTER — Ambulatory Visit: Attending: Cardiology | Admitting: Cardiology

## 2024-07-23 ENCOUNTER — Encounter: Payer: Self-pay | Admitting: Cardiology

## 2024-07-23 VITALS — BP 130/78 | HR 72 | Ht 62.0 in | Wt 116.6 lb

## 2024-07-23 DIAGNOSIS — I480 Paroxysmal atrial fibrillation: Secondary | ICD-10-CM

## 2024-07-23 DIAGNOSIS — Z720 Tobacco use: Secondary | ICD-10-CM

## 2024-07-23 DIAGNOSIS — D6869 Other thrombophilia: Secondary | ICD-10-CM | POA: Diagnosis not present

## 2024-07-23 NOTE — Patient Instructions (Signed)
 Medication Instructions:  Your physician recommends that you continue on your current medications as directed. Please refer to the Current Medication list given to you today.    *If you need a refill on your cardiac medications before your next appointment, please call your pharmacy*  Lab Work: No labs ordered today    Testing/Procedures: No test ordered today   Follow-Up: At [facility], you and your health needs are our priority.  As part of our continuing mission to provide you with exceptional heart care, our providers are all part of one team.  This team includes your primary Cardiologist (physician) and Advanced Practice Providers or APPs (Physician Assistants and Nurse Practitioners) who all work together to provide you with the care you need, when you need it.  Your next appointment:   6 month(s)  Provider:   Suzann Riddle, NP

## 2024-07-23 NOTE — Progress Notes (Signed)
 Electrophysiology Clinic Note    Date:  07/23/2024  Patient ID:  Sheila, [name] 04-Dec-1960, MRN [id number] PCP:  Ziglar, Susan K, MD  Cardiologist:  Evalene Lunger, MD  Electrophysiologist:  OLE ONEIDA HOLTS, MD    Discussed the use of AI scribe software for clinical note transcription with the patient, who gave verbal consent to proceed.   Patient Profile    Chief Complaint: AFib, flutter follow-up  History of Present Illness: Sheila [name] is a 63 y.o. female with PMH notable for afib, aflutter, SVT, HTN, HFpEF, PAD s/p subclavian stenting, COPD, OSA, tobacco use; seen today for OLE ONEIDA HOLTS, MD for routine electrophysiology followup.   She is status post aborted AF ablation d/t 09/2023 due to severely thickened intra-atrial septum.  Her amiodarone  was stopped due to young age.  She last saw Dr. Holts 05/2024 at which time she was doing well with very rare atrial fibrillation episodes. He recommended updated Zio monitor to further evaluate atrial fibrillation burden which showed frequent PAC, no AFib.  On follow-up today, she continues to have brief AF episodes that last  no longer than 30 minutes a few days a week. She has palpitations and some fatigue afterwards, but she is overall content with her level of AFib burden. She continues to take eliquis  BID, no bleeding concerns.  She continues to smoke, has reduced use.         Arrhythmia/Device History Amiodarone  - stopped 09/2023     ROS:  Please see the history of present illness. All other systems are reviewed and otherwise negative.    Physical Exam    VS:  BP 130/78 (BP Location: Left Arm, Patient Position: Sitting, Cuff Size: Normal)   Pulse 72   Ht 5' 2 (1.575 m)   Wt 116 lb 9.6 oz (52.9 kg)   LMP 08/09/2008   SpO2 95%   BMI 21.33 kg/m  BMI: Body mass index is 21.33 kg/m.           Wt Readings from Last 3 Encounters:  07/23/24 116 lb 9.6 oz (52.9 kg)  06/26/24 144 lb (65.3 kg)   06/08/24 146 lb (66.2 kg)     GEN- The patient is well appearing, alert and oriented x 3 today.   Lungs- Clear to ausculation bilaterally, normal work of breathing.  Heart- Regular rate and rhythm, no murmurs, rubs or gallops Extremities- No peripheral edema, warm, dry   Studies Reviewed   Previous EP, cardiology notes.    EKG is ordered. Personal review of EKG from today shows:    EKG Interpretation Date/Time:  Monday July 23 2024 13:40:28 EST Ventricular Rate:  72 PR Interval:  186 QRS Duration:  98 QT Interval:  416 QTC Calculation: 455 R Axis:   -30  Text Interpretation: Normal sinus rhythm Left axis deviation Left ventricular hypertrophy with repolarization abnormality ( R in aVL , Cornell product ) Confirmed by Sederick Jacobsen [phone]) on 07/23/2024 1:44:06 PM    Long term monitor, 06/19/2024 HR 27 to 211, average 85. 16 nonsustained SVT, longest 18 beats 4 pauses, longest 4.4 seconds Frequent supraventricular ectopy, 10.9% Rare ventricular ectopy. No sustained arrhythmias. No atrial fibrillation.  Cardiac MRI, 10/31/2023 1. Findings most consistent with severe lipomatous hypertrophy of the atrial septum.  2. Normal biventricular chamber size and function, LVEF 56%, RVEF 56%. Mild RV insertion point LGE on the ventricular septum, which may indicate elevated pulmonary pressures.  3.  No hemodynamically significant valvular heart disease.  Cardiac CT, 08/17/2023 1. There is normal pulmonary vein drainage into the left atrium. (2 on the right and 2 on the left) with ostial measurements as above.  2. The left atrial appendage is a Windsock type with ostial size 19 x 11 mm and length 26 mm, Area 16 mm2. There is no thrombus in the left atrial appendage.  3. The esophagus runs in the left atrial midline and is not in the proximity to any of the pulmonary veins.  4. Coronary calcium  score of 0.   Assessment and Plan    #) afib, aflutter S/p Aflutter ablation S/p  aborted AFib ablation d/t thickened intraatrial septum Overall low burden of Afib by patient symptoms. No AFib on recent zio monitor Continue to monitor AF burden, if increases can consider tikosyn vs multaq Continue 12.5mg  coreg  BID  #) Hypercoag d/t parox afib CHA2DS2-VASc Score = at least 4 [CHF History: 1, HTN History: 1, Diabetes History: 0, Stroke History: 0, Vascular Disease History: 1, Age Score: 0, Gender Score: 1].  Therefore, the patient's annual risk of stroke is 4.8 %.    Stroke ppx - 5mg  eliquis  BID, appropriately dosed No bleeding concerns Recent CBC stable  #) Tobacco use Congratulated on reduced tobacco use and encouraged complete cessation         Current medicines are reviewed at length with the patient today.   The patient does not have concerns regarding her medicines.  The following changes were made today:  none  Labs/ tests ordered today include:  Orders Placed This Encounter  Procedures   EKG 12-Lead     Disposition: Follow up with Dr. Kennyth or EP APP in 6 months, sooner if needed   Signed, Chantal Needle, NP  07/23/2024  3:18 PM  Electrophysiology [facility]

## 2024-08-14 ENCOUNTER — Ambulatory Visit: Admitting: Family Medicine

## 2024-08-14 ENCOUNTER — Encounter: Payer: Self-pay | Admitting: Family Medicine

## 2024-08-14 DIAGNOSIS — M8088XD Other osteoporosis with current pathological fracture, vertebra(e), subsequent encounter for fracture with routine healing: Secondary | ICD-10-CM

## 2024-08-14 DIAGNOSIS — F419 Anxiety disorder, unspecified: Secondary | ICD-10-CM

## 2024-08-14 MED ORDER — ALPRAZOLAM 0.5 MG PO TABS: 0.5000 mg | ORAL_TABLET | Freq: Four times a day (QID) | ORAL | 3 refills | Status: AC

## 2024-08-14 MED ORDER — OXYCODONE-ACETAMINOPHEN 5-325 MG PO TABS: 1.0000 | ORAL_TABLET | Freq: Three times a day (TID) | ORAL | 0 refills | Status: AC | PRN

## 2024-08-16 NOTE — Progress Notes (Signed)
 "  Established Patient Office Visit  Subjective   Patient ID: Sheila [name], female    DOB: 1960-11-12  Age: 64 y.o. MRN: [id number]  Chief Complaint  Patient presents with   Medical Management of Chronic Issues    41-month follow up; denies any main concerns for today's visit other than having some pain in her back.    HPI Discussed the use of AI scribe software for clinical note transcription with the patient, who gave verbal consent to proceed.  History of Present Illness   Sheila [name] is a delightful 64 year old female with PAF, HTN, osteoporosis (multiple vertebral fractures), gross hematuria (urology workup negative), GAD, vitamin D  deficiency, COPD (still smoking, follows with pulmonology).  She experiences variability in her blood pressure, describing it as 'good one minute and high the next.' She confirms taking her medication today but notes inconsistency in blood pressure control. No recent consumption of high-salt foods like canned pickles, which she acknowledges can elevate her blood pressure.  She has a history of atrial fibrillation and previously wore a heart monitor. She is currently not on amiodarone  due to its side effects. She was on amiodarone  for nearly a year following a heart attack but was taken off the medication in February. She is currently managing her condition without it.  She is requesting pain medication for her osteoporotic vertebral fractures.  She has to get a dentist to clear her before because she can start on a bisphosphonate. She saw urology and had cystoscopy because of her gross hematuria.  Her cystoscopy was normal.  She was given samples of Gemtesa  for urgency and it worked really well.  However her insurance will not cover it.        ROS    Objective:     BP (!) 175/115   Pulse 78   Temp 97.6 F (36.4 C) (Oral)   Ht 5' 2 (1.575 m)   Wt 150 lb 12.8 oz (68.4 kg)   LMP 08/09/2008   SpO2 91%   BMI 27.58 kg/m    Physical  Exam Vitals and nursing note reviewed.  Constitutional:      Appearance: Normal appearance.  HENT:     Head: Normocephalic and atraumatic.  Eyes:     Conjunctiva/sclera: Conjunctivae normal.  Cardiovascular:     Rate and Rhythm: Normal rate and regular rhythm.  Pulmonary:     Effort: Pulmonary effort is normal.     Breath sounds: Normal breath sounds.  Musculoskeletal:     Right lower leg: No edema.     Left lower leg: No edema.  Skin:    General: Skin is warm and dry.  Neurological:     Mental Status: She is alert and oriented to person, place, and time.  Psychiatric:        Mood and Affect: Mood normal.        Behavior: Behavior normal.        Thought Content: Thought content normal.        Judgment: Judgment normal.          No results found for any visits on 08/14/24.    The ASCVD Risk score (Arnett DK, et al., 2019) failed to calculate for the following reasons:   Risk score cannot be calculated because patient has a medical history suggesting prior/existing ASCVD   * - Cholesterol units were assumed    Assessment & Plan:  Anxiety -     ALPRAZolam ; Take 1 tablet (0.5  mg total) by mouth in the morning, at noon, in the evening, and at bedtime.  Dispense: 120 tablet; Refill: 3  Osteoporotic compression fracture of vertebra with routine healing, subsequent encounter -     oxyCODONE -Acetaminophen ; Take 1 tablet by mouth every 8 (eight) hours as needed.  Dispense: 45 tablet; Refill: 0 -     oxyCODONE -Acetaminophen ; Take 1 tablet by mouth every 8 (eight) hours as needed.  Dispense: 45 tablet; Refill: 0 -     oxyCODONE -Acetaminophen ; Take 1 tablet by mouth every 8 (eight) hours as needed.  Dispense: 45 tablet; Refill: 0     No follow-ups on file.    Taydon Nasworthy K Cono Gebhard, MD "

## 2024-08-29 ENCOUNTER — Ambulatory Visit: Admitting: Family Medicine

## 2024-11-12 ENCOUNTER — Ambulatory Visit: Admitting: Family Medicine

## 2024-11-23 ENCOUNTER — Ambulatory Visit: Admitting: Urology

## 2024-12-25 ENCOUNTER — Ambulatory Visit: Admitting: Urology
